# Patient Record
Sex: Female | Born: 1995
Health system: Southern US, Community
[De-identification: ages and names within clinical notes are randomized; demographics above are authoritative.]

## PROBLEM LIST (undated history)

## (undated) DIAGNOSIS — N393 Stress incontinence (female) (male): Secondary | ICD-10-CM

## (undated) DIAGNOSIS — F419 Anxiety disorder, unspecified: Secondary | ICD-10-CM

## (undated) DIAGNOSIS — F329 Major depressive disorder, single episode, unspecified: Secondary | ICD-10-CM

## (undated) DIAGNOSIS — F32A Depression, unspecified: Secondary | ICD-10-CM

## (undated) DIAGNOSIS — N3281 Overactive bladder: Secondary | ICD-10-CM

## (undated) DIAGNOSIS — J45909 Unspecified asthma, uncomplicated: Secondary | ICD-10-CM

## (undated) HISTORY — PX: WISDOM TOOTH EXTRACTION: SHX21

## (undated) HISTORY — DX: Overactive bladder: N32.81

## (undated) HISTORY — DX: Stress incontinence (female) (male): N39.3

---
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
## 2018-03-24 ENCOUNTER — Other Ambulatory Visit: Payer: Self-pay | Admitting: Nurse Practitioner

## 2018-03-24 ENCOUNTER — Ambulatory Visit
Admission: RE | Admit: 2018-03-24 | Discharge: 2018-03-24 | Disposition: A | Discharge status: Home / Self Care | Payer: Commercial Managed Care - PPO | Source: Ambulatory Visit | Attending: Nurse Practitioner | Admitting: Nurse Practitioner

## 2018-03-24 DIAGNOSIS — R590 Localized enlarged lymph nodes: Secondary | ICD-10-CM

## 2018-03-29 ENCOUNTER — Other Ambulatory Visit: Payer: Managed Care, Other (non HMO)

## 2018-03-29 ENCOUNTER — Other Ambulatory Visit: Payer: Self-pay | Admitting: Nurse Practitioner

## 2018-03-29 ENCOUNTER — Ambulatory Visit
Admission: RE | Admit: 2018-03-29 | Discharge: 2018-03-29 | Disposition: A | Discharge status: Home / Self Care | Payer: Managed Care, Other (non HMO) | Source: Ambulatory Visit | Attending: Nurse Practitioner | Admitting: Nurse Practitioner

## 2018-03-29 DIAGNOSIS — M79622 Pain in left upper arm: Secondary | ICD-10-CM

## 2018-03-29 DIAGNOSIS — R590 Localized enlarged lymph nodes: Secondary | ICD-10-CM

## 2018-04-01 ENCOUNTER — Other Ambulatory Visit: Payer: Self-pay | Admitting: Nurse Practitioner

## 2018-04-01 ENCOUNTER — Ambulatory Visit
Admission: RE | Admit: 2018-04-01 | Discharge: 2018-04-01 | Disposition: A | Discharge status: Home / Self Care | Payer: Commercial Managed Care - PPO | Source: Ambulatory Visit | Attending: Nurse Practitioner | Admitting: Nurse Practitioner

## 2018-04-01 DIAGNOSIS — M79622 Pain in left upper arm: Secondary | ICD-10-CM

## 2018-08-06 DIAGNOSIS — T7840XA Allergy, unspecified, initial encounter: Secondary | ICD-10-CM | POA: Insufficient documentation

## 2018-11-12 DIAGNOSIS — Z30431 Encounter for routine checking of intrauterine contraceptive device: Secondary | ICD-10-CM | POA: Diagnosis not present

## 2018-12-03 ENCOUNTER — Ambulatory Visit (HOSPITAL_COMMUNITY)
Admission: EM | Admit: 2018-12-03 | Discharge: 2018-12-03 | Disposition: A | Discharge status: Home / Self Care | Payer: Commercial Managed Care - PPO | Attending: Internal Medicine | Admitting: Internal Medicine

## 2018-12-03 ENCOUNTER — Encounter (HOSPITAL_COMMUNITY): Payer: Self-pay | Admitting: Emergency Medicine

## 2018-12-03 DIAGNOSIS — M25511 Pain in right shoulder: Secondary | ICD-10-CM | POA: Diagnosis not present

## 2018-12-03 HISTORY — DX: Depression, unspecified: F32.A

## 2018-12-03 HISTORY — DX: Anxiety disorder, unspecified: F41.9

## 2018-12-03 HISTORY — DX: Major depressive disorder, single episode, unspecified: F32.9

## 2018-12-03 MED ORDER — NAPROXEN 500 MG PO TABS: 500.0000 mg | ORAL_TABLET | Freq: Two times a day (BID) | ORAL | 0 refills | Status: DC

## 2018-12-03 NOTE — Discharge Instructions (Signed)
Shoulder exam today suggests a rotator cuff issue, possibly overuse.  Avoid activities that markedly increase pain.  Prescription for naproxen (anti inflammatory/pain reliever) was given; ice for 5-10 minutes several times daily may also help decrease pain.  Physical therapy may help decrease pain/stiffness also.  Anticipate gradual improvement over the next few weeks.  Recheck or followup with your primary care provider or sports med or orthopedic care provider to discuss next steps if not improving as expected.

## 2018-12-03 NOTE — ED Triage Notes (Signed)
Pt c/o L shoulder pain x2 weeks ago, denies injury, denies heavy lifting, states it hurts when she raises her arm above her head.

## 2018-12-03 NOTE — ED Provider Notes (Signed)
[facility]    CSN: [id number] Arrival date & time: 12/03/18  1030     History   Chief Complaint Chief Complaint  Patient presents with  . Shoulder Pain    HPI Brianna Barr is a 23 y.o. female.   She presents today with 2-week history of left shoulder pain.  Able to move her shoulder, just painful.  Recently got a new mattress, but sleeps in the same position, typically with both arms overhead.  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  Not coughing, not short of breath.  No unusual leg pain or swelling.  Has had some issues with loose stools in the last several months, after taking prolonged courses of antibiotics twice last year for episodes of lymphadenitis, axillary and cervical.  No rash.  No fever.    HPI  Past Medical History:  Diagnosis Date  . Anxiety   . Asthma   . Depression      Past Surgical History:  Procedure Laterality Date  . WISDOM TOOTH EXTRACTION         Home Medications    Prior to Admission medications   Medication Sig Start Date End Date Taking? Authorizing Provider  Vilazodone HCl (VIIBRYD) 20 MG TABS TAKE 1 TABLET BY MOUTH EVERY DAY 07/22/18  Yes [provider]  ipratropium (ATROVENT) 0.06 % nasal spray Place 2 sprays into both nostrils 4 (four) times daily. 11/22/13   Presson, Mathis Fare, PA  naproxen (NAPROSYN) 500 MG tablet Take 1 tablet (500 mg total) by mouth 2 (two) times daily. 12/03/18   Isa Rankin, MD    Family History No family history on file.  Social History Social History   Tobacco Use  . Smoking status: Never Smoker  Substance Use Topics  . Alcohol use: No  . Drug use: No     Allergies   Benadryl [diphenhydramine hcl (sleep)]   Review of Systems Review of Systems  All other systems reviewed and are negative.    Physical Exam Triage Vital Signs ED Triage Vitals  Enc Vitals Group     BP  12/03/18 1113 124/74     Pulse Rate 12/03/18 1112 89     Resp 12/03/18 1112 16     Temp 12/03/18 1112 (!) 97.4 F (36.3 C)     Temp src --      SpO2 12/03/18 1112 99 %     Weight --      Height --      Pain Score 12/03/18 1113 3     Pain Loc --    Updated Vital Signs BP 124/74   Pulse 89   Temp (!) 97.4 F (36.3 C)   Resp 16   SpO2 99%  Physical Exam Vitals signs and nursing note reviewed.  Constitutional:      General: She is not in acute distress.    Comments: Alert, nicely groomed  HENT:     Head: Atraumatic.  Eyes:     Comments: Conjugate gaze, no eye redness/drainage  Neck:     Musculoskeletal: Neck supple.  Cardiovascular:     Rate and Rhythm: Normal rate.  Pulmonary:     Effort: No respiratory distress.  Abdominal:     General: There is no distension.  Musculoskeletal: Normal range of motion.     Comments: No leg swelling Able to lift both arms overhead quickly, hurts at the  left shoulder.  Able to externally and internally rotate, particularly painful to internally rotate the left shoulder. Tender to palpation along the anterior joint line of the left shoulder.  No tenderness over the [facility].  Skin:    General: Skin is warm and dry.     Comments: No cyanosis  Neurological:     Mental Status: She is alert and oriented to person, place, and time.      Final Clinical Impressions(s) / UC Diagnoses   Final diagnoses:  Acute pain of right shoulder     Discharge Instructions     Shoulder exam today suggests a rotator cuff issue, possibly overuse.  Avoid activities that markedly increase pain.  Prescription for naproxen (anti inflammatory/pain reliever) was given; ice for 5-10 minutes several times daily may also help decrease pain.  Physical therapy may help decrease pain/stiffness also.  Anticipate gradual improvement over the next few weeks.  Recheck or followup with your primary care provider or sports med or orthopedic care provider to discuss next  steps if not improving as expected.     ED Prescriptions    Medication Sig Dispense Auth. Provider   naproxen (NAPROSYN) 500 MG tablet Take 1 tablet (500 mg total) by mouth 2 (two) times daily. 30 tablet Isa RankinMurray, Loy Mccartt Wilson, MD        Isa RankinMurray, Luara Faye Wilson, MD 12/04/18 [id number]

## 2019-05-11 ENCOUNTER — Encounter: Payer: Self-pay | Admitting: Podiatry

## 2019-05-11 ENCOUNTER — Other Ambulatory Visit: Payer: Self-pay | Admitting: Podiatry

## 2019-05-11 ENCOUNTER — Other Ambulatory Visit: Payer: Self-pay

## 2019-05-11 ENCOUNTER — Ambulatory Visit (INDEPENDENT_AMBULATORY_CARE_PROVIDER_SITE_OTHER): Payer: Commercial Managed Care - PPO

## 2019-05-11 ENCOUNTER — Ambulatory Visit: Payer: Commercial Managed Care - PPO | Admitting: Podiatry

## 2019-05-11 VITALS — Temp 98.2°F

## 2019-05-11 DIAGNOSIS — M79671 Pain in right foot: Secondary | ICD-10-CM | POA: Diagnosis not present

## 2019-05-11 DIAGNOSIS — M722 Plantar fascial fibromatosis: Secondary | ICD-10-CM

## 2019-05-11 DIAGNOSIS — M79672 Pain in left foot: Secondary | ICD-10-CM

## 2019-05-11 MED ORDER — DICLOFENAC SODIUM 75 MG PO TBEC: 75.0000 mg | DELAYED_RELEASE_TABLET | Freq: Two times a day (BID) | ORAL | 2 refills | Status: DC

## 2019-05-11 NOTE — Patient Instructions (Signed)

## 2019-05-12 NOTE — Progress Notes (Signed)
Subjective:   Patient ID: Brianna Barr, female   DOB: 23 y.o.   MRN: [id number]   HPI Patient presents stating she is developed a lot of pain in the bottom of both heels and the forefoot is starting to hurt due to the inability to wear spend much time bearing weight against the heels.  Patient states they have gradually gotten worse and it is been relatively recent since then started within the last month.  Patient does not smoke and would like to be more active   Review of Systems  All other systems reviewed and are negative.       Objective:  Physical Exam Vitals signs and nursing note reviewed.  Constitutional:      Appearance: She is well-developed.  Pulmonary:     Effort: Pulmonary effort is normal.  Musculoskeletal: Normal range of motion.  Skin:    General: Skin is warm.  Neurological:     Mental Status: She is alert.     Neurovascular status intact muscle strength found to be adequate range of motion within normal limits with patient found to have exquisite discomfort in the plantar heel region bilateral with inflammation fluid of the medial band and also obesity noted which is complicating factor to condition.  Patient does have depressed arch and has good digital perfusion well oriented x3       Assessment:  Acute plantar fasciitis bilateral with inflammation fluid of the medial band     Plan:  H&P conditions reviewed and today I injected the fascia 3 mg Kenalog 5 mg Xylocaine applied fascial brace bilateral gave instructions for physical therapy anti-inflammatories and supportive shoes and reappoint to recheck.  Also placed on diclofenac 75 mg twice daily  X-rays did not indicate spur with no indications of stress fracture arthritis

## 2019-05-25 ENCOUNTER — Ambulatory Visit: Payer: Commercial Managed Care - PPO | Admitting: Podiatry

## 2019-05-26 ENCOUNTER — Encounter: Payer: Self-pay | Admitting: Podiatry

## 2019-05-26 ENCOUNTER — Other Ambulatory Visit: Payer: Self-pay

## 2019-05-26 ENCOUNTER — Ambulatory Visit (INDEPENDENT_AMBULATORY_CARE_PROVIDER_SITE_OTHER): Payer: Commercial Managed Care - PPO | Admitting: Podiatry

## 2019-05-26 VITALS — Temp 98.2°F

## 2019-05-26 DIAGNOSIS — M722 Plantar fascial fibromatosis: Secondary | ICD-10-CM | POA: Diagnosis not present

## 2019-05-26 NOTE — Progress Notes (Signed)
Subjective:   Patient ID: Brianna Barr, female   DOB: 23 y.o.   MRN: [id number]   HPI Patient states my feet feel a lot better with mild discomfort but overall improved   ROS      Objective:  Physical Exam  Neurovascular status intact with significant diminishment of discomfort in the plantar fascial bilateral with mild discomfort still noted and pain also in the dorsum of the foot bilateral F2     Assessment:  Fasciitis symptoms improved with dorsal pain improved currently     Plan:  H&P conditions reviewed and today I went ahead and I advised on physical therapy stretching exercises shoe elevation and if symptoms were to reoccur will be seen back again and may require further treatment or orthotic treatment

## 2019-07-01 ENCOUNTER — Ambulatory Visit: Payer: Commercial Managed Care - PPO | Admitting: Family Medicine

## 2019-07-07 ENCOUNTER — Encounter: Payer: Self-pay | Admitting: Family Medicine

## 2019-07-07 ENCOUNTER — Other Ambulatory Visit: Payer: Self-pay

## 2019-07-07 ENCOUNTER — Ambulatory Visit: Payer: Commercial Managed Care - PPO | Admitting: Family Medicine

## 2019-07-07 VITALS — BP 110/70 | HR 102 | Temp 96.2°F | Ht 62.0 in | Wt 234.0 lb

## 2019-07-07 DIAGNOSIS — Z7689 Persons encountering health services in other specified circumstances: Secondary | ICD-10-CM

## 2019-07-07 DIAGNOSIS — N3281 Overactive bladder: Secondary | ICD-10-CM

## 2019-07-07 DIAGNOSIS — F419 Anxiety disorder, unspecified: Secondary | ICD-10-CM

## 2019-07-07 DIAGNOSIS — M722 Plantar fascial fibromatosis: Secondary | ICD-10-CM | POA: Diagnosis not present

## 2019-07-07 DIAGNOSIS — F32A Depression, unspecified: Secondary | ICD-10-CM | POA: Insufficient documentation

## 2019-07-07 DIAGNOSIS — J302 Other seasonal allergic rhinitis: Secondary | ICD-10-CM | POA: Diagnosis not present

## 2019-07-07 DIAGNOSIS — Z23 Encounter for immunization: Secondary | ICD-10-CM | POA: Diagnosis not present

## 2019-07-07 DIAGNOSIS — J4520 Mild intermittent asthma, uncomplicated: Secondary | ICD-10-CM | POA: Diagnosis not present

## 2019-07-07 DIAGNOSIS — F329 Major depressive disorder, single episode, unspecified: Secondary | ICD-10-CM

## 2019-07-07 DIAGNOSIS — R61 Generalized hyperhidrosis: Secondary | ICD-10-CM

## 2019-07-07 MED ORDER — VIIBRYD 20 MG PO TABS: ORAL_TABLET | ORAL | 3 refills | Status: DC

## 2019-07-07 NOTE — Patient Instructions (Signed)
Hyperhidrosis Hyperhidrosis is a condition in which the body sweats a lot more than normal (excessively). Sweating is a necessary function for a human body. It is normal to sweat when you are hot, physically active, or anxious. However, hyperhidrosis is sweating to an excessive degree. Although the condition is not a serious one, it can make you feel embarrassed. There are two kinds of hyperhidrosis:  Primary hyperhidrosis. The sweating usually localizes in one part of your body, such as your underarms, or in a few areas, such as your feet, face, underarms, and hands. This is the more common kind of hyperhidrosis.  Secondary hyperhidrosis. This type usually affects your entire body. What are the causes? The cause of this condition depends on the kind of hyperhidrosis that you have.  Primary hyperhidrosis may be caused by sweat glands that are more active than normal.  Secondary hyperhidrosis may be caused by an underlying condition or by taking certain medicines, such as antidepressants or diabetes medicines. Possible conditions that may cause secondary hyperhidrosis include: ? Diabetes. ? Gout. ? Anxiety. ? Obesity. ? Menopause. ? Overactive thyroid (hyperthyroidism). ? Tumors. ? Frostbite. ? Certain types of cancers. ? Alcoholism. ? Injury to your nervous system. ? Stroke. ? Parkinson's disease. What increases the risk? You are more likely to develop primary hyperhidrosis if you have a family history of the condition. What are the signs or symptoms? Symptoms of this condition include:  Feeling like you are sweating constantly, even while you are not being active.  Having skin that peels or gets paler or softer in the areas where you sweat the most.  Being able to see sweat on your skin. Other symptoms depend on the kind of hyperhidrosis that you have.  Symptoms of primary hyperhidrosis may include: ? Sweating in the same location on both sides of your body. ? Sweating only  during the day and not while you are sleeping. ? Sweating in specific areas, such as your underarms, palms, feet, and face.  Symptoms of secondary hyperhidrosis may include: ? Sweating all over your body. ? Sweating even while you sleep. How is this diagnosed? This condition may be diagnosed by:  Medical history.  Physical exam. You may also have other tests, including:  Tests to measure the amount of sweat you produce and to show the areas where you sweat the most. These tests may involve: ? Using color-changing chemicals to show patterns of sweating on the skin. ? Weighing paper that has been applied to the skin. This will show the amount of sweat that your body produces. ? Measuring the amount of water that evaporates from the skin. ? Using infrared technology to show patterns of sweating on the skin.  Tests to check for other conditions that may be causing excess sweating. This may include blood, urine, or imaging tests. How is this treated? Treatment for this condition depends on the kind of hyperhidrosis that you have and the areas of your body that are affected. Your health care provider will also treat any underlying conditions. Treatment may include:  Medicines, such as: ? Antiperspirants. These are medicines that stop sweat. ? Injectable medicines. These may include small injections of botulinum toxin. ? Oral medicines. These are taken by mouth to treat underlying conditions and other symptoms.  A procedure to: ? Temporarily turn off the sweat glands in your hands and feet (iontophoresis). ? Remove your sweat glands. ? Cut or destroy the nerves so that they do not send a signal to the sweat   glands (sympathectomy). Follow these instructions at home: Lifestyle   Limit or avoid foods or beverages that may increase your risk of sweating, such as: ? Spicy food. ? Caffeine. ? Alcohol. ? Foods that contain monosodium glutamate (MSG).  If your feet sweat: ? Wear  sandals when possible. ? Do not wear cotton socks. Wear socks that remove or wick moisture from your feet. ? Wear leather shoes. ? Avoid wearing the same pair of shoes for two days in a row.  Try placing sweat pads under your clothes to prevent underarm sweat from showing.  Keep a journal of your sweat symptoms and when they occur. This may help you identify things that trigger your sweating. General instructions  Take over-the-counter and prescription medicines only as told by your health care provider.  Use antiperspirants as told by your health care provider.  Consider joining a hyperhidrosis support group.  Keep all follow-up visits as told by your health care provider. This is important. Contact a health care provider if:  You have new symptoms.  Your symptoms get worse. Summary  Hyperhidrosis is a condition in which the body sweats a lot more than normal (excessively).  With primary hyperhidrosis, the sweating usually localizes in one part of your body, such as your underarms, or in a few areas, such as your feet, face, underarms, and hands. It is caused by overactive sweat glands in the affected area.  With secondary hyperhidrosis, the sweating affects your entire body. This is caused by an underlying condition.  Treatment for this condition depends on the kind of hyperhidrosis that you have and the parts of your body that are affected. This information is not intended to replace advice given to you by your health care provider. Make sure you discuss any questions you have with your health care provider. Document Released: 12/12/2005 Document Revised: 10/16/2017 Document Reviewed: 10/16/2017 Elsevier Patient Education  2020 [employer]  Living With Anxiety  After being diagnosed with an anxiety disorder, you may be relieved to know why you have felt or behaved a certain way. It is natural to also feel overwhelmed about the treatment ahead and what it will mean for your  life. With care and support, you can manage this condition and recover from it. How to cope with anxiety Dealing with stress Stress is your body's reaction to life changes and events, both good and bad. Stress can last just a few hours or it can be ongoing. Stress can play a major role in anxiety, so it is important to learn both how to cope with stress and how to think about it differently. Talk with your health care provider or a counselor to learn more about stress reduction. He or she may suggest some stress reduction techniques, such as:  Music therapy. This can include creating or listening to music that you enjoy and that inspires you.  Mindfulness-based meditation. This involves being aware of your normal breaths, rather than trying to control your breathing. It can be done while sitting or walking.  Centering prayer. This is a kind of meditation that involves focusing on a word, phrase, or sacred image that is meaningful to you and that brings you peace.  Deep breathing. To do this, expand your stomach and inhale slowly through your nose. Hold your breath for 3-5 seconds. Then exhale slowly, allowing your stomach muscles to relax.  Self-talk. This is a skill where you identify thought patterns that lead to anxiety reactions and correct those thoughts.  Muscle  relaxation. This involves tensing muscles then relaxing them. Choose a stress reduction technique that fits your lifestyle and personality. Stress reduction techniques take time and practice. Set aside 5-15 minutes a day to do them. Therapists can offer training in these techniques. The training may be covered by some insurance plans. Other things you can do to manage stress include:  Keeping a stress diary. This can help you learn what triggers your stress and ways to control your response.  Thinking about how you respond to certain situations. You may not be able to control everything, but you can control your reaction.  Making  time for activities that help you relax, and not feeling guilty about spending your time in this way. Therapy combined with coping and stress-reduction skills provides the best chance for successful treatment. Medicines Medicines can help ease symptoms. Medicines for anxiety include:  Anti-anxiety drugs.  Antidepressants.  Beta-blockers. Medicines may be used as the main treatment for anxiety disorder, along with therapy, or if other treatments are not working. Medicines should be prescribed by a health care provider. Relationships Relationships can play a big part in helping you recover. Try to spend more time connecting with trusted friends and family members. Consider going to couples counseling, taking family education classes, or going to family therapy. Therapy can help you and others better understand the condition. How to recognize changes in your condition Everyone has a different response to treatment for anxiety. Recovery from anxiety happens when symptoms decrease and stop interfering with your daily activities at home or work. This may mean that you will start to:  Have better concentration and focus.  Sleep better.  Be less irritable.  Have more energy.  Have improved memory. It is important to recognize when your condition is getting worse. Contact your health care provider if your symptoms interfere with home or work and you do not feel like your condition is improving. Where to find help and support: You can get help and support from these sources:  Self-help groups.  Online and [employer].  A trusted spiritual leader.  Couples counseling.  Family education classes.  Family therapy. Follow these instructions at home:  Eat a healthy diet that includes plenty of vegetables, fruits, whole grains, low-fat dairy products, and lean protein. Do not eat a lot of foods that are high in solid fats, added sugars, or salt.  Exercise. Most adults should do  the following: ? Exercise for at least 150 minutes each week. The exercise should increase your heart rate and make you sweat (moderate-intensity exercise). ? Strengthening exercises at least twice a week.  Cut down on caffeine, tobacco, alcohol, and other potentially harmful substances.  Get the right amount and quality of sleep. Most adults need 7-9 hours of sleep each night.  Make choices that simplify your life.  Take over-the-counter and prescription medicines only as told by your health care provider.  Avoid caffeine, alcohol, and certain over-the-counter cold medicines. These may make you feel worse. Ask your pharmacist which medicines to avoid.  Keep all follow-up visits as told by your health care provider. This is important. Questions to ask your health care provider  Would I benefit from therapy?  How often should I follow up with a health care provider?  How long do I need to take medicine?  Are there any long-term side effects of my medicine?  Are there any alternatives to taking medicine? Contact a health care provider if:  You have a hard time staying  focused or finishing daily tasks.  You spend many hours a day feeling worried about everyday life.  You become exhausted by worry.  You start to have headaches, feel tense, or have nausea.  You urinate more than normal.  You have diarrhea. Get help right away if:  You have a racing heart and shortness of breath.  You have thoughts of hurting yourself or others. If you ever feel like you may hurt yourself or others, or have thoughts about taking your own life, get help right away. You can go to your nearest emergency department or call:  Your local emergency services (911 in the [place]  A suicide crisis helpline, such as the National Suicide Prevention Lifeline at (702) 625-89271-463-706-9420. This is open 24-hours a day. Summary  Taking steps to deal with stress can help calm you.  Medicines cannot cure anxiety  disorders, but they can help ease symptoms.  Family, friends, and partners can play a big part in helping you recover from an anxiety disorder. This information is not intended to replace advice given to you by your health care provider. Make sure you discuss any questions you have with your health care provider. Document Released: 10/07/2016 Document Revised: 09/25/2017 Document Reviewed: 10/07/2016 Elsevier Patient Education  2020 [employer]  Living With Depression Everyone experiences occasional disappointment, sadness, and loss in their lives. When you are feeling down, blue, or sad for at least 2 weeks in a row, it may mean that you have depression. Depression can affect your thoughts and feelings, relationships, daily activities, and physical health. It is caused by changes in the way your brain functions. If you receive a diagnosis of depression, your health care provider will tell you which type of depression you have and what treatment options are available to you. If you are living with depression, there are ways to help you recover from it and also ways to prevent it from coming back. How to cope with lifestyle changes Coping with stress     Stress is your body's reaction to life changes and events, both good and bad. Stressful situations may include:  Getting married.  The death of a spouse.  Losing a job.  Retiring.  Having a baby. Stress can last just a few hours or it can be ongoing. Stress can play a major role in depression, so it is important to learn both how to cope with stress and how to think about it differently. Talk with your health care provider or a counselor if you would like to learn more about stress reduction. He or she may suggest some stress reduction techniques, such as:  Music therapy. This can include creating music or listening to music. Choose music that you enjoy and that inspires you.  Mindfulness-based meditation. This kind of meditation can  be done while sitting or walking. It involves being aware of your normal breaths, rather than trying to control your breathing.  Centering prayer. This is a kind of meditation that involves focusing on a spiritual word or phrase. Choose a word, phrase, or sacred image that is meaningful to you and that brings you peace.  Deep breathing. To do this, expand your stomach and inhale slowly through your nose. Hold your breath for 3-5 seconds, then exhale slowly, allowing your stomach muscles to relax.  Muscle relaxation. This involves intentionally tensing muscles then relaxing them. Choose a stress reduction technique that fits your lifestyle and personality. Stress reduction techniques take time and practice to develop. Set aside 5-15  minutes a day to do them. Therapists can offer training in these techniques. The training may be covered by some insurance plans. Other things you can do to manage stress include:  Keeping a stress diary. This can help you learn what triggers your stress and ways to control your response.  Understanding what your limits are and saying no to requests or events that lead to a schedule that is too full.  Thinking about how you respond to certain situations. You may not be able to control everything, but you can control how you react.  Adding humor to your life by watching funny films or TV shows.  Making time for activities that help you relax and not feeling guilty about spending your time this way.  Medicines Your health care provider may suggest certain medicines if he or she feels that they will help improve your condition. Avoid using alcohol and other substances that may prevent your medicines from working properly (may interact). It is also important to:  Talk with your pharmacist or health care provider about all the medicines that you take, their possible side effects, and what medicines are safe to take together.  Make it your goal to take part in all  treatment decisions (shared decision-making). This includes giving input on the side effects of medicines. It is best if shared decision-making with your health care provider is part of your total treatment plan. If your health care provider prescribes a medicine, you may not notice the full benefits of it for 4-8 weeks. Most people who are treated for depression need to be on medicine for at least 6-12 months after they feel better. If you are taking medicines as part of your treatment, do not stop taking medicines without first talking to your health care provider. You may need to have the medicine slowly decreased (tapered) over time to decrease the risk of harmful side effects. Relationships Your health care provider may suggest family therapy along with individual therapy and drug therapy. While there may not be family problems that are causing you to feel depressed, it is still important to make sure your family learns as much as they can about your mental health. Having your family's support can help make your treatment successful. How to recognize changes in your condition Everyone has a different response to treatment for depression. Recovery from major depression happens when you have not had signs of major depression for two months. This may mean that you will start to:  Have more interest in doing activities.  Feel less hopeless than you did 2 months ago.  Have more energy.  Overeat less often, or have better or improving appetite.  Have better concentration. Your health care provider will work with you to decide the next steps in your recovery. It is also important to recognize when your condition is getting worse. Watch for these signs:  Having fatigue or low energy.  Eating too much or too little.  Sleeping too much or too little.  Feeling restless, agitated, or hopeless.  Having trouble concentrating or making decisions.  Having unexplained physical complaints.  Feeling  irritable, angry, or aggressive. Get help as soon as you or your family members notice these symptoms coming back. How to get support and help from others How to talk with friends and family members about your condition  Talking to friends and family members about your condition can provide you with one way to get support and guidance. Reach out to trusted friends or  family members, explain your symptoms to them, and let them know that you are working with a health care provider to treat your depression. Financial resources Not all insurance plans cover mental health care, so it is important to check with your insurance carrier. If paying for co-pays or counseling services is a problem, search for a local or county mental health care center. They may be able to offer public mental health care services at low or no cost when you are not able to see a private health care provider. If you are taking medicine for depression, you may be able to get the generic form, which may be less expensive. Some makers of prescription medicines also offer help to patients who cannot afford the medicines they need. Follow these instructions at home:   Get the right amount and quality of sleep.  Cut down on using caffeine, tobacco, alcohol, and other potentially harmful substances.  Try to exercise, such as walking or lifting small weights.  Take over-the-counter and prescription medicines only as told by your health care provider.  Eat a healthy diet that includes plenty of vegetables, fruits, whole grains, low-fat dairy products, and lean protein. Do not eat a lot of foods that are high in solid fats, added sugars, or salt.  Keep all follow-up visits as told by your health care provider. This is important. Contact a health care provider if:  You stop taking your antidepressant medicines, and you have any of these symptoms: ? Nausea. ? Headache. ? Feeling lightheaded. ? Chills and body aches. ? Not being  able to sleep (insomnia).  You or your friends and family think your depression is getting worse. Get help right away if:  You have thoughts of hurting yourself or others. If you ever feel like you may hurt yourself or others, or have thoughts about taking your own life, get help right away. You can go to your nearest emergency department or call:  Your local emergency services (911 in the [place]  A suicide crisis helpline, such as the National Suicide Prevention Lifeline at 250-373-7805. This is open 24-hours a day. Summary  If you are living with depression, there are ways to help you recover from it and also ways to prevent it from coming back.  Work with your health care team to create a management plan that includes counseling, stress management techniques, and healthy lifestyle habits. This information is not intended to replace advice given to you by your health care provider. Make sure you discuss any questions you have with your health care provider. Document Released: 09/15/2016 Document Revised: 02/04/2019 Document Reviewed: 09/15/2016 Elsevier Patient Education  2020 [employer]  Overactive Bladder, Adult  Overactive bladder refers to a condition in which a person has a sudden need to pass urine. The person may leak urine if he or she cannot get to the bathroom fast enough (urinary incontinence). A person with this condition may also wake up several times in the night to go to the bathroom. Overactive bladder is associated with poor nerve signals between your bladder and your brain. Your bladder may get the signal to empty before it is full. You may also have very sensitive muscles that make your bladder squeeze too soon. These symptoms might interfere with daily work or social activities. What are the causes? This condition may be associated with or caused by:  Urinary tract infection.  Infection of nearby tissues, such as the prostate.  Prostate enlargement.   Surgery on the uterus  or urethra.  Bladder stones, inflammation, or tumors.  Drinking too much caffeine or alcohol.  Certain medicines, especially medicines that get rid of extra fluid in the body (diuretics).  Muscle or nerve weakness, especially from: ? A spinal cord injury. ? Stroke. ? Multiple sclerosis. ? Parkinson's disease.  Diabetes.  Constipation. What increases the risk? You may be at greater risk for overactive bladder if you:  Are an older adult.  Smoke.  Are going through menopause.  Have prostate problems.  Have a neurological disease, such as stroke, dementia, Parkinson's disease, or multiple sclerosis (MS).  Eat or drink things that irritate the bladder. These include alcohol, spicy food, and caffeine.  Are overweight or obese. What are the signs or symptoms? Symptoms of this condition include:  Sudden, strong urge to urinate.  Leaking urine.  Urinating 8 or more times a day.  Waking up to urinate 2 or more times a night. How is this diagnosed? Your health care provider may suspect overactive bladder based on your symptoms. He or she will diagnose this condition by:  A physical exam and medical history.  Blood or urine tests. You might need bladder or urine tests to help determine what is causing your overactive bladder. You might also need to see a health care provider who specializes in urinary tract problems (urologist). How is this treated? Treatment for overactive bladder depends on the cause of your condition and whether it is mild or severe. You can also make lifestyle changes at home. Options include:  Bladder training. This may include: ? Learning to control the urge to urinate by following a schedule that directs you to urinate at regular intervals (timed voiding). ? Doing Kegel exercises to strengthen your pelvic floor muscles, which support your bladder. Toning these muscles can help you control urination, even if your bladder muscles  are overactive.  Special devices. This may include: ? Biofeedback, which uses sensors to help you become aware of your body's signals. ? Electrical stimulation, which uses electrodes placed inside the body (implanted) or outside the body. These electrodes send gentle pulses of electricity to strengthen the nerves or muscles that control the bladder. ? Women may use a plastic device that fits into the vagina and supports the bladder (pessary).  Medicines. ? Antibiotics to treat bladder infection. ? Antispasmodics to stop the bladder from releasing urine at the wrong time. ? Tricyclic antidepressants to relax bladder muscles. ? Injections of botulinum toxin type A directly into the bladder tissue to relax bladder muscles.  Lifestyle changes. This may include: ? Weight loss. Talk to your health care provider about weight loss methods that would work best for you. ? Diet changes. This may include reducing how much alcohol and caffeine you consume, or drinking fluids at different times of the day. ? Not smoking. Do not use any products that contain nicotine or tobacco, such as cigarettes and e-cigarettes. If you need help quitting, ask your health care provider.  Surgery. ? A device may be implanted to help manage the nerve signals that control urination. ? An electrode may be implanted to stimulate electrical signals in the bladder. ? A procedure may be done to change the shape of the bladder. This is done only in very severe cases. Follow these instructions at home: Lifestyle  Make any diet or lifestyle changes that are recommended by your health care provider. These may include: ? Drinking less fluid or drinking fluids at different times of the day. ? Cutting down on  caffeine or alcohol. ? Doing Kegel exercises. ? Losing weight if needed. ? Eating a healthy and balanced diet to prevent constipation. This may include:  Eating foods that are high in fiber, such as fresh fruits and  vegetables, whole grains, and beans.  Limiting foods that are high in fat and processed sugars, such as fried and sweet foods. General instructions  Take over-the-counter and prescription medicines only as told by your health care provider.  If you were prescribed an antibiotic medicine, take it as told by your health care provider. Do not stop taking the antibiotic even if you start to feel better.  Use any implants or pessary as told by your health care provider.  If needed, wear pads to absorb urine leakage.  Keep a journal or log to track how much and when you drink and when you feel the need to urinate. This will help your health care provider monitor your condition.  Keep all follow-up visits as told by your health care provider. This is important. Contact a health care provider if:  You have a fever.  Your symptoms do not get better with treatment.  Your pain and discomfort get worse.  You have more frequent urges to urinate. Get help right away if:  You are not able to control your bladder. Summary  Overactive bladder refers to a condition in which a person has a sudden need to pass urine.  Several conditions may lead to an overactive bladder.  Treatment for overactive bladder depends on the cause and severity of your condition.  Follow your health care provider's instructions about lifestyle changes, doing Kegel exercises, keeping a journal, and taking medicines. This information is not intended to replace advice given to you by your health care provider. Make sure you discuss any questions you have with your health care provider. Document Released: 08/09/2009 Document Revised: 02/03/2019 Document Reviewed: 10/29/2017 Elsevier Patient Education  2020 [employer].

## 2019-07-07 NOTE — Progress Notes (Signed)
SUBJECTIVE: PMH: Pt is a 23 yo female with pmh sig for asthma, anxiety, depression, OAB, hyperhidrosis, plantar fasciitis, and seasonal allergies.  Patient previously seen at [facility].  Asthma: -Albuterol as needed -Symptoms with exercise, changes in weather, if he gets sick.  Anxiety and depression: -In the past was on Zoloft and Lexapro without success -Viibryd has worked -Patient stopped taking Viibryd at the start of COVID-19 pandemic.  - Interested in restarting medication. -Has been in counseling since grade school.  Needs to find a new counselor. -Patient endorses difficulty with virtual visits 2/2 increased anxiety with people on the phone or being seen virtually.  OAB: -Seen by urology in the past -Started on medication but did not like it -Not drinking caffeine or eating spicy foods -will decrease fluid intake -We will wear a panty liner if has to be out for prolonged periods of time. -Considering doing Keagle's  Hyperhidrosis: -Endorses intense sweating in armpits, hands, feet -Was on medication in the past but it stopped working. -Has tried numerous OTC treatment options including deodorants and creams.  Plantar fasciitis -Seen by podiatry -S/p steroid Injection -Also given diclofenac to take every other day -Stretching  Seasonal allergies: -May use OTC eyedrops as needed  Allergies: NKDA  Past surgical history: Wisdom teeth extraction  Social history: Patient is single.  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  Patient denies alcohol, tobacco, drug use.   Past Medical History:  Diagnosis Date  . Anxiety   . Asthma   . Depression     Past Surgical History:  Procedure Laterality Date  . WISDOM TOOTH EXTRACTION      Current Outpatient Medications on File Prior to Visit  Medication Sig Dispense Refill  . ALBUTEROL IN Inhale into the lungs.    . diclofenac (VOLTAREN) 75 MG EC tablet Take 1 tablet (75 mg  total) by mouth 2 (two) times daily. 50 tablet 2  . ipratropium (ATROVENT) 0.06 % nasal spray Place 2 sprays into both nostrils 4 (four) times daily. 15 mL 0  . Olopatadine HCl 0.2 % SOLN Apply to eye.    . Vilazodone HCl (VIIBRYD) 20 MG TABS TAKE 1 TABLET BY MOUTH EVERY DAY     No current facility-administered medications on file prior to visit.     No Active Allergies  History reviewed. No pertinent family history.  Social History   Socioeconomic History  . Marital status: Single    Spouse name: Not on file  . Number of children: Not on file  . Years of education: Not on file  . Highest education level: Not on file  Occupational History  . Not on file  Social Needs  . Financial resource strain: Not on file  . Food insecurity    Worry: Not on file    Inability: Not on file  . Transportation needs    Medical: Not on file    Non-medical: Not on file  Tobacco Use  . Smoking status: Never Smoker  . Smokeless tobacco: Never Used  Substance and Sexual Activity  . Alcohol use: No  . Drug use: No  . Sexual activity: Not on file  Lifestyle  . Physical activity    Days per week: Not on file    Minutes per session: Not on file  . Stress: Not on file  Relationships  . Social Herbalist on phone: Not on file    Gets together: Not on file  Attends religious service: Not on file    Active member of club or organization: Not on file    Attends meetings of clubs or organizations: Not on file    Relationship status: Not on file  . Intimate partner violence    Fear of current or ex partner: Not on file    Emotionally abused: Not on file    Physically abused: Not on file    Forced sexual activity: Not on file  Other Topics Concern  . Not on file  Social History Narrative  . Not on file    ROS General: Denies fever, chills, night sweats, changes in weight, changes in appetite  + hyperhidrosis HEENT: Denies headaches, ear pain, changes in vision, rhinorrhea, sore  throat CV: Denies CP, palpitations, SOB, orthopnea Pulm: Denies SOB, cough, wheezing +h/o asthma GI: Denies abdominal pain, nausea, vomiting, diarrhea, constipation GU: Denies dysuria, hematuria, frequency, vaginal discharge  + Urinary urgency Msk: Denies muscle cramps, joint pains  +foot pain Neuro: Denies weakness, numbness, tingling Skin: Denies rashes, bruising Psych: Denies hallucinations  +anxiety and depression   BP 110/70 (BP Location: Left Arm, Patient Position: Sitting, Cuff Size: Large)   Pulse (!) 102   Temp (!) 96.2 F (35.7 C) (Oral)   Ht 5\' 2"  (1.575 m)   Wt 234 lb (106.1 kg)   SpO2 98%   BMI 42.80 kg/m   Physical Exam Gen. Pleasant, well developed, well-nourished, in NAD HEENT - [place]/AT, PERRL, EOMI, no scleral icterus, no nasal drainage, pharynx without erythema or exudate. Lungs: no use of accessory muscles, no dullness to percussion, CTAB, no wheezes, rales or rhonchi Cardiovascular: RRR, No r/g/m, no peripheral edema Abdomen: BS present, soft, nontender,nondistended Musculoskeletal: No deformities, moves all four extremities, no cyanosis or clubbing, normal tone Neuro:  A&Ox3, CN II-XII intact, normal gait Skin:  Warm, dry, intact, no lesions  No results found for this or any previous visit (from the past 2160 hour(s)).  Assessment/Plan: Anxiety and depression  -PHQ 9 score 11 -Gad 7 score 14 -Discussed behavioral health.  Patient encouraged to make her own appointment. -Pt to d/c diclofenac as may cause serotonin syndrome when used with Viibryd - Plan: Vilazodone HCl (VIIBRYD) 20 MG TABS  Mild intermittent asthma without complication -Albuterol inhaler as needed  Seasonal allergies -Continue OTC eye gtts  Plantar fasciitis -Improving s/p steroid injection -Continue follow-up with podiatry -Continue braces, stretching, supportive shoes -pt to d/c diclofenac, as may cause serotonin syndrome when combined with Viibryd  Hyperhidrosis -Discussed  various treatment options including Botox -Given handout  OAB (overactive bladder) -Discussed Keagle exercises, bladder training -Given handout -Patient to find out medication previously on and notify clinic -Consider follow-up with Urology  Encounter to establish care -We reviewed the PMH, PSH, FH, SH, Meds and Allergies. -We provided refills for any medications we will prescribe as needed. -We addressed current concerns per orders and patient instructions. -We have asked for records for pertinent exams, studies, vaccines and notes from previous providers. -We have advised patient to follow up per instructions below.  Need for influenza vaccine -given this visit  F/u 4-6 wks  Abbe AmsterdamShannon Alexavier Tsutsui, MD

## 2019-07-14 ENCOUNTER — Other Ambulatory Visit: Payer: Self-pay | Admitting: Certified Nurse Midwife

## 2019-07-14 ENCOUNTER — Encounter: Payer: Self-pay | Admitting: Certified Nurse Midwife

## 2019-07-14 ENCOUNTER — Other Ambulatory Visit: Payer: Self-pay

## 2019-07-14 ENCOUNTER — Ambulatory Visit (INDEPENDENT_AMBULATORY_CARE_PROVIDER_SITE_OTHER): Payer: Commercial Managed Care - PPO | Admitting: Certified Nurse Midwife

## 2019-07-14 VITALS — BP 131/92 | HR 103 | Ht 62.0 in | Wt 234.7 lb

## 2019-07-14 DIAGNOSIS — Z01419 Encounter for gynecological examination (general) (routine) without abnormal findings: Secondary | ICD-10-CM

## 2019-07-14 DIAGNOSIS — Z113 Encounter for screening for infections with a predominantly sexual mode of transmission: Secondary | ICD-10-CM | POA: Diagnosis not present

## 2019-07-14 DIAGNOSIS — N898 Other specified noninflammatory disorders of vagina: Secondary | ICD-10-CM | POA: Diagnosis not present

## 2019-07-14 DIAGNOSIS — Z124 Encounter for screening for malignant neoplasm of cervix: Secondary | ICD-10-CM | POA: Diagnosis not present

## 2019-07-14 DIAGNOSIS — N942 Vaginismus: Secondary | ICD-10-CM

## 2019-07-14 DIAGNOSIS — N3941 Urge incontinence: Secondary | ICD-10-CM

## 2019-07-14 LAB — POCT URINALYSIS DIPSTICK
Bilirubin, UA: NEGATIVE
Glucose, UA: NEGATIVE
Ketones, UA: NEGATIVE
Leukocytes, UA: NEGATIVE
Nitrite, UA: NEGATIVE
Protein, UA: POSITIVE — AB
Spec Grav, UA: 1.015 (ref 1.010–1.025)
Urobilinogen, UA: 0.2 E.U./dL
pH, UA: 6 (ref 5.0–8.0)

## 2019-07-14 MED ORDER — FLUCONAZOLE 150 MG PO TABS: 150.0000 mg | ORAL_TABLET | Freq: Every day | ORAL | 0 refills | Status: DC

## 2019-07-14 MED ORDER — TINIDAZOLE 500 MG PO TABS: 2.0000 g | ORAL_TABLET | Freq: Every day | ORAL | 0 refills | Status: DC

## 2019-07-14 NOTE — Progress Notes (Signed)
Pt presents for new patient AEX. She states that her last pap there was at age 23. Last pap showed ASCUS, but negative HPV. Pt does complain of having some vaginal discharge with a fishy odor. She states that the discharge has subsided, but the odor is still there. She also complains of having some burning with urination. Also complains of having pain with IC when she was sexually active 2 years ago. She has not been active within the last 2 years.

## 2019-07-15 LAB — CERVICOVAGINAL ANCILLARY ONLY
Bacterial Vaginitis (gardnerella): NEGATIVE
Candida Glabrata: NEGATIVE
Candida Vaginitis: NEGATIVE
Molecular Disclaimer: NEGATIVE
Molecular Disclaimer: NEGATIVE
Molecular Disclaimer: NEGATIVE
Molecular Disclaimer: NORMAL
Trichomonas: NEGATIVE

## 2019-07-15 LAB — HEPATITIS C ANTIBODY: Hep C Virus Ab: <0.1 s/co ratio (ref 0.0–0.9)

## 2019-07-15 LAB — RPR: RPR Ser Ql: NONREACTIVE

## 2019-07-15 LAB — HEPATITIS B SURFACE ANTIGEN: Hepatitis B Surface Ag: NEGATIVE

## 2019-07-15 LAB — HIV ANTIBODY (ROUTINE TESTING W REFLEX): HIV Screen 4th Generation wRfx: NONREACTIVE

## 2019-07-16 LAB — URINE CULTURE

## 2019-07-16 LAB — CERVICOVAGINAL ANCILLARY ONLY
Chlamydia: NEGATIVE
Neisseria Gonorrhea: NEGATIVE

## 2019-07-16 NOTE — Progress Notes (Signed)
GYNECOLOGY ANNUAL PREVENTATIVE CARE ENCOUNTER NOTE  History:     Brianna Barr is a 23 y.o. G0P0000 female here for a gynecologic exam. She is a new patient to the practice and here to establish care.  Current complaints: vaginal discharge and odor, pain with intercourse, and dysuria.   Denies abnormal vaginal bleeding, pelvic pain, or other gynecologic concerns.  She request STD screening.    Gynecologic History No LMP recorded. (Menstrual status: IUD). Contraception: IUD- Mirena IUD placed in 2018 per patient  Last Pap: 2018. Results were: abnormal with negative HPV  Obstetric History OB History  Gravida Para Term Preterm AB Living  0 0 0 0 0 0  SAB TAB Ectopic Multiple Live Births  0 0 0 0 0    Past Medical History:  Diagnosis Date  . Anxiety   . Asthma   . Depression     Past Surgical History:  Procedure Laterality Date  . WISDOM TOOTH EXTRACTION      Current Outpatient Medications on File Prior to Visit  Medication Sig Dispense Refill  . ALBUTEROL IN Inhale into the lungs.    Marland Kitchen ipratropium (ATROVENT) 0.06 % nasal spray Place 2 sprays into both nostrils 4 (four) times daily. 15 mL 0  . Vilazodone HCl (VIIBRYD) 20 MG TABS Take half a tab (10 mg) daily x 7 days.  Then increase to 1 tab (20 mg) daily 30 tablet 3  . diclofenac (VOLTAREN) 75 MG EC tablet Take 1 tablet (75 mg total) by mouth 2 (two) times daily. (Patient not taking: Reported on 07/14/2019) 50 tablet 2  . Olopatadine HCl 0.2 % SOLN Apply to eye.    . Vilazodone HCl (VIIBRYD) 20 MG TABS TAKE 1 TABLET BY MOUTH EVERY DAY     No current facility-administered medications on file prior to visit.     No Active Allergies  Social History:  reports that she has never smoked. She has never used smokeless tobacco. She reports that she does not drink alcohol or use drugs.  Family History  Problem Relation Age of Onset  . Multiple sclerosis Mother   . Hypertension Father   . Hypertension Maternal  Grandmother   . Diabetes Maternal Grandmother   . Heart disease Maternal Grandmother   . Cervical cancer Maternal Grandmother   . Stroke Paternal Grandmother   . Heart disease Paternal Grandmother   . Hypertension Paternal Grandmother   . Diabetes Paternal Grandmother   . Endometrial cancer Paternal Grandmother   . Stroke Paternal Grandfather     The following portions of the patient's history were reviewed and updated as appropriate: allergies, current medications, past family history, past medical history, past social history, past surgical history and problem list.  Review of Systems Pertinent items noted in HPI and remainder of comprehensive ROS otherwise negative.  Physical Exam:  BP (!) 131/92   Pulse (!) 103   Ht 5\' 2"  (1.575 m)   Wt 234 lb 11.2 oz (106.5 kg)   BMI 42.93 kg/m  CONSTITUTIONAL: Well-developed, obese female in no acute distress.  HENT:  Normocephalic, atraumatic, External right and left ear normal. Oropharynx is clear and moist EYES: Conjunctivae and EOM are normal. Pupils are equal, round, and reactive to light.  NECK: Normal range of motion, supple, no masses.  Normal thyroid.  SKIN: Skin is warm and dry. No rash noted. Not diaphoretic. No erythema. No pallor. MUSCULOSKELETAL: Normal range of motion. No tenderness.  No cyanosis, clubbing, or edema.  2+  distal pulses. NEUROLOGIC: Alert and oriented to person, place, and time. Normal reflexes, muscle tone coordination. No cranial nerve deficit noted. PSYCHIATRIC: Normal mood and affect. Normal behavior. Normal judgment and thought content. CARDIOVASCULAR: Normal heart rate noted, regular rhythm RESPIRATORY: Clear to auscultation bilaterally. Effort and breath sounds normal, no problems with respiration noted. BREASTS: Symmetric in size. No masses, skin changes, nipple drainage, or lymphadenopathy. ABDOMEN: Soft, normal bowel sounds, no distention noted.  No tenderness, rebound or guarding.  PELVIC: curdy white  discharge present on external genitalia between labia and clitoris; normal appearing vaginal mucosa and cervix. Small amount of white thin discharge with odor present.  Pap smear obtained.  Normal uterine size, no other palpable masses. Pelvic floor discomfort and pelvic pain with speculum examination.    Assessment and Plan:    1. Women's annual routine gynecological examination - Pap smear obtained today, hx of abnormal pap in 2018 (ASCUS with neg HPV) - if 2 normal pap smears after abnormal can move to 3 year recommendation  - Cytology - PAP( [facility])  2. Urinary incontinence, urge - Patient reports dysuria and urge incontinence over the past month, has appointment scheduled with urology for the incontinence  - POCT Urinalysis Dipstick - Urine Culture  3. Screening for STDs (sexually transmitted diseases) - Patient request screening for STDs even though she denies intercourse in the past 2 years  - Cervicovaginal ancillary only( [place]) - Hepatitis B surface antigen - Hepatitis C antibody - HIV Antibody (routine testing w rflx) - RPR  4. Vaginismus - Patient reports pain during intercourse- throughout with insertion and penetration, she also reports pain with pelvic examinations and use of tampons  - She has tried lubrication prior to intercourse and continues to have pain  - Has not had intercourse in 2 years due to pain  - continued vaginal spasms noted during pelvic examination and pelvic floor discomfort with bimanual examination  - Discussed assessment and management plan with Dr Alysia PennaErvin, recommends GYN PT then follow up, can use lidocaine jelly and gabapentin as well  - Ambulatory referral to Physical Therapy  5. Vaginal discharge - report discharge started occurring last month, denies current discharge but reports odor still present  - Based on physical examination and clinical symptoms will treat for BV and yeast  - Swabs pending  - tinidazole (TINDAMAX) 500 MG  tablet; Take 4 tablets (2,000 mg total) by mouth daily with breakfast. For two days  Dispense: 8 tablet; Refill: 0 - fluconazole (DIFLUCAN) 150 MG tablet; Take 1 tablet (150 mg total) by mouth daily.  Dispense: 1 tablet; Refill: 0   Will follow up results of pap smear and manage accordingly. Patient referral placed and will follow up in 3 months  Routine preventative health maintenance measures emphasized. Please refer to After Visit Summary for other counseling recommendations.        Sharyon CableVeronica C Dauna Ziska, CNM [facility]

## 2019-07-25 ENCOUNTER — Encounter: Payer: Self-pay | Admitting: Physical Therapy

## 2019-07-25 ENCOUNTER — Other Ambulatory Visit: Payer: Self-pay

## 2019-07-25 ENCOUNTER — Ambulatory Visit: Payer: Commercial Managed Care - PPO | Attending: Certified Nurse Midwife | Admitting: Physical Therapy

## 2019-07-25 DIAGNOSIS — N3941 Urge incontinence: Secondary | ICD-10-CM | POA: Diagnosis present

## 2019-07-25 DIAGNOSIS — N942 Vaginismus: Secondary | ICD-10-CM | POA: Diagnosis present

## 2019-07-25 DIAGNOSIS — R279 Unspecified lack of coordination: Secondary | ICD-10-CM | POA: Diagnosis present

## 2019-07-25 DIAGNOSIS — R252 Cramp and spasm: Secondary | ICD-10-CM

## 2019-07-25 NOTE — Patient Instructions (Addendum)
   Guided Meditation for Pelvic Floor Relaxation  [employer]  Daily massage the perineal area but no more than 3/10 pain.   [facility] [address], [place], [place] [address] Phone # [phone] Fax [phone]

## 2019-07-25 NOTE — Therapy (Signed)
[facility] [address] [place], [place], [address] Phone: [phone]   Fax:  [phone]  Physical Therapy Treatment  Patient Details  Name: May Manrique MRN: [id number] Date of Birth: [birth year] Referring Provider (PT): Dr. Steward Drone   Encounter Date: 07/25/2019  PT End of Session - 07/25/19 1240    Visit Number  1    Date for PT Re-Evaluation  10/17/19    Authorization Type  [facility]R    PT Start Time  1200    PT Stop Time  1238    PT Time Calculation (min)  38 min    Activity Tolerance  Patient tolerated treatment well;Patient limited by pain    Behavior During Therapy  [facility] for tasks assessed/performed       Past Medical History:  Diagnosis Date  . Anxiety   . Asthma   . Depression     Past Surgical History:  Procedure Laterality Date  . WISDOM TOOTH EXTRACTION      There were no vitals filed for this visit.  Subjective Assessment - 07/25/19 1200    Subjective  Patient had difficulty with tampons. Patient had lots of pain with the 2-3 times she has had intercourse. Unable to use tampons. Pain with internal exam with gynecologist. I have overactive bladder and I have urinary leakage. Patient has had to wear incontinence pads.    Patient Stated Goals  relax the pelvic floor and penile penetration, use a tampon, urge    Currently in Pain?  Yes    Pain Score  9     Pain Location  Vagina    Pain Orientation  Mid    Pain Descriptors / Indicators  Throbbing;Burning;Sharp    Pain Type  Acute pain    Pain Onset  More than a month ago    Pain Frequency  Intermittent    Aggravating Factors   penile penetration, vaginal exam, wearing a tampon    Pain Relieving Factors  no penetration of the vagina    Multiple Pain Sites  Yes    Pain Score  4    Pain Location  Back    Pain Orientation  Right;Left;Lower    Pain Descriptors / Indicators  Sore;Aching;Sharp    Pain Type  Chronic pain    Pain Onset   More than a month ago    Pain Frequency  Intermittent    Aggravating Factors   sitting, lifting    Pain Relieving Factors  ice, heat         [facility] PT Assessment - 07/25/19 0001      Assessment   Medical Diagnosis  N94.2 Vaginismus    Referring Provider (PT)  Dr. Steward Drone    Onset Date/Surgical Date  --   chronic   Prior Therapy  none; sees chiropractor for back pain      Precautions   Precautions  None      Restrictions   Weight Bearing Restrictions  No      Balance Screen   Has the patient fallen in the past 6 months  No    Has the patient had a decrease in activity level because of a fear of falling?   No    Is the patient reluctant to leave their home because of a fear of falling?   No      Home Environment   Living Environment  Private residence      Prior Function   Level of  Independence  Independent    Vocation  Full time employment    Occupational psychologist      Cognition   Overall Cognitive Status  Within Functional Limits for tasks assessed      Posture/Postural Control   Posture/Postural Control  No significant limitations      ROM / Strength   AROM / PROM / Strength  AROM;PROM;Strength      Palpation   Palpation comment  tightness on the suprapubic area; tenderness located in lumbar, bil. buttocks area                Pelvic Floor Special Questions - 07/25/19 0001    Currently Sexually Active  No   due to pain   Marinoff Scale  pain prevents any attempts at intercourse    Urinary Leakage  Yes    Pad use  1 pantyliner    Activities that cause leaking  With strong urge;Laughing;Coughing    Urinary urgency  Yes    Urinary frequency  every 1 to 1.5 hours; always a small amount    Fecal incontinence  No    Pelvic Floor Internal Exam  Patient confirms identification and approves PT  to assess pelvic floor and treatment    Exam Type  Vaginal;Rectal    Palpation  tender in the ischiocarvernosus, bulbocavernosus, levator ani; Tender  with Q-tip for all the clocks    Strength  --   unable to assess due to anxiety and pain       [facility] Adult PT Treatment/Exercise - 07/25/19 0001      Self-Care   Self-Care  Other Self-Care Comments    Other Self-Care Comments   education on ways to improve vaginal health, products that could help with the PH, creams that will assist in reduction of the irritation of the vulva area,, massaging the vulva to reduce pain and irritation             PT Education - 07/25/19 1238    Education Details  information on Good Clean love to restore the [place] level, guided pelvic floor meditation, massaging the perineum    Person(s) Educated  Patient    Methods  Explanation;Demonstration;Handout    Comprehension  Verbalized understanding;Returned demonstration       PT Short Term Goals - 07/25/19 1256      PT SHORT TERM GOAL #1   Title  independent with initial HEP    Time  4    Period  Weeks    Status  New    Target Date  08/22/19      PT SHORT TERM GOAL #2   Title  able to tolerate external palpation to the vulva area with pain level </= 3/10    Time  4    Period  Weeks    Status  New    Target Date  08/22/19      PT SHORT TERM GOAL #3   Title  using pelvic floor meditation to relax the pelvic floor and decrease her aniety    Time  4    Period  Weeks    Status  New    Target Date  08/22/19      PT SHORT TERM GOAL #4   Title  understand vaginal health and how to restore ph balance of the vagina    Time  4    Period  Weeks    Status  New    Target Date  08/22/19  PT Long Term Goals - 07/25/19 1330      PT LONG TERM GOAL #1   Title  independent with advanced HEP    Time  12    Period  Weeks    Status  New    Target Date  10/17/19      PT LONG TERM GOAL #2   Title  able to have penile penetration with Marinoff scalr 1/3 due to improve tissue elongation    Time  12    Period  Weeks    Status  New    Target Date  10/17/19      PT LONG TERM GOAL #3   Title   able to fully empty her bladder due to relaxation of the pelvic floor    Time  12    Period  Weeks    Status  New    Target Date  10/17/19      PT LONG TERM GOAL #4   Title  urinary leakage decreased >/= 80% due to decreased over active bladder activity and understands ways to calm the bladder down    Time  12    Period  Weeks    Status  New    Target Date  10/17/19      PT LONG TERM GOAL #5   Title  not having to wear a pad due to reduction of urinary leakage    Time  12    Period  Weeks    Status  New    Target Date  10/17/19            Plan - 07/25/19 1243    Clinical Impression Statement  Patient is a 23 year old female with vaginismus. Patient reports her vaginal pain is 8/10 when she has penetration into the vaginal canal. When patient has a vaginal exam the doctor will use the smallest speculum. During the Qtip test pain at all areas of the vulva and she had some redness. Patient was not able to tolerate an internal vaginal exam. Patient reports intermittent lumbar pain at level 5/10 with sitting.  Patient reports she has urge incontinence and has to wear 1 pantyliner per day due to urinary leakage. Patient has to go to the bathroom every 1.5 hours. Patient will leak urine with the urge to urinate, coughing, and sneezing. Patient has tenderness located in the lumbar, bilateral gluteal, ischiocavernosus, and bulbocavernosus. Tight fascial in the suprapubic area. Patient will benefit from skilled therapy to improve pelvic floor coordination, reduce pain, and improve function.    Personal Factors and Comorbidities  Sex;Profession    Examination-Activity Limitations  Continence;Toileting    Examination-Participation Restrictions  Interpersonal Relationship    Stability/Clinical Decision Making  Evolving/Moderate complexity    Clinical Decision Making  Low    Rehab Potential  Excellent    PT Frequency  1x / week    PT Duration  12 weeks    PT Treatment/Interventions   Biofeedback;Cryotherapy;Electrical Stimulation;Moist Heat;Ultrasound;Therapeutic exercise;Therapeutic activities;Neuromuscular re-education;Patient/family education;Dry needling;Manual techniques    PT Next Visit Plan  facial release of the diaphragm and urogenital area, hip stretches, diaphragmatic breathing,    Consulted and Agree with Plan of Care  Patient       Patient will benefit from skilled therapeutic intervention in order to improve the following deficits and impairments:  Decreased coordination, Decreased range of motion, Increased fascial restricitons, Increased muscle spasms, Decreased activity tolerance, Pain, Impaired flexibility, Decreased strength, Decreased mobility  Visit Diagnosis: Cramp and spasm -  Plan: PT plan of care cert/re-cert  Urge incontinence of urine - Plan: PT plan of care cert/re-cert  Vaginismus - Plan: PT plan of care cert/re-cert  Unspecified lack of coordination - Plan: PT plan of care cert/re-cert     Problem List Patient Active Problem List   Diagnosis Date Noted  . Anxiety and depression 07/07/2019  . Mild intermittent asthma without complication 07/07/2019  . Seasonal allergies 07/07/2019  . Plantar fasciitis 07/07/2019  . Hyperhidrosis 07/07/2019  . OAB (overactive bladder) 07/07/2019  . Allergy 08/06/2018    Eulis Fosterheryl Yannis Broce, PT 07/25/19 1:37 PM   [facility] [address], [address] [place], [place], [address] Phone: [phone]   Fax:  [phone]  Name: Efraim Kaufmannlana Dionne Galster MRN: [id number] Date of Birth: [birth year]

## 2019-08-02 ENCOUNTER — Ambulatory Visit: Payer: Commercial Managed Care - PPO | Attending: Certified Nurse Midwife | Admitting: Physical Therapy

## 2019-08-02 ENCOUNTER — Encounter: Payer: Self-pay | Admitting: Physical Therapy

## 2019-08-02 ENCOUNTER — Other Ambulatory Visit: Payer: Self-pay

## 2019-08-02 DIAGNOSIS — R252 Cramp and spasm: Secondary | ICD-10-CM | POA: Diagnosis present

## 2019-08-02 DIAGNOSIS — N942 Vaginismus: Secondary | ICD-10-CM

## 2019-08-02 DIAGNOSIS — R279 Unspecified lack of coordination: Secondary | ICD-10-CM | POA: Diagnosis present

## 2019-08-02 DIAGNOSIS — N3941 Urge incontinence: Secondary | ICD-10-CM

## 2019-08-02 NOTE — Patient Instructions (Signed)
Access Code: [email or web address]  URL: [email or web address]  Date: 08/02/2019  Prepared by: Earlie Counts   Exercises Supine Figure 4 Piriformis Stretch - 1 reps - 1 sets - 30 sec hold - 1x daily - 7x weekly Supine Single Knee to Chest - 1 reps - 1 sets - 30 sec hold - 1x daily - 7x weekly Supine Hamstring Stretch with Strap - 1 reps - 1 sets - 30 sec hold - 1x daily - 7x weekly Quadruped Rocking Slow - 10 reps - 1 sets - 1x daily - 7x weekly Supine Butterfly Groin Stretch - 1 reps - 1 sets - 1 min hold - 1x daily - 7x weekly Quadruped Cat Camel - 10 reps - 1 sets - 1x daily - 7x weekly Child's Pose Stretch - 5 reps - 1 sets - 10 sec hold - 1x daily - 7x weekly Patient Education Trigger Point Dry Needling

## 2019-08-02 NOTE — Therapy (Signed)
[facility] [address], [address] [place], [place], [address] Phone: [phone]   Fax:  [phone]  Physical Therapy Treatment  Patient Details  Name: Brianna Kaufmannlana Dionne Rinks MRN: [id number] Date of Birth: [birth year] Referring Provider (PT): Dr. Steward DroneVeronica Rogers   Encounter Date: 08/02/2019  PT End of Session - 08/02/19 0845    Visit Number  2    Date for PT Re-Evaluation  10/17/19    Authorization Type  [facility]R    PT Start Time  0845    PT Stop Time  0925    PT Time Calculation (min)  40 min    Activity Tolerance  Patient tolerated treatment well;Patient limited by pain    Behavior During Therapy  [facility] for tasks assessed/performed       Past Medical History:  Diagnosis Date  . Anxiety   . Asthma   . Depression     Past Surgical History:  Procedure Laterality Date  . WISDOM TOOTH EXTRACTION      There were no vitals filed for this visit.  Subjective Assessment - 08/02/19 0847    Subjective  I felt pretty good after the initial evaluation. The HEP is doing well.    Patient Stated Goals  relax the pelvic floor and penile penetration, use a tampon, urge    Currently in Pain?  Yes    Pain Score  9     Pain Location  Vagina    Pain Orientation  Mid    Pain Descriptors / Indicators  Burning;Throbbing;Sharp    Pain Type  Acute pain    Pain Onset  More than a month ago    Pain Frequency  Intermittent    Aggravating Factors   penile penetration, vaginal exam, wearing a tampon    Pain Relieving Factors  no penetration of the vagina    Multiple Pain Sites  No                       [facility] Adult PT Treatment/Exercise - 08/02/19 0001      Self-Care   Self-Care  Other Self-Care Comments    Other Self-Care Comments   education on how to use the q-tip to work on vaginal penetration and using the desert harvest revelum      Lumbar Exercises: Stretches   Active Hamstring Stretch  Right;Left;1 rep;30 seconds    supine   Active Hamstring Stretch Limitations  used a strap    Single Knee to Chest Stretch  Right;Left;1 rep;30 seconds    Prone on Elbows Stretch  1 rep;20 seconds    Piriformis Stretch  Right;Left;1 rep;30 seconds   supine   Other Lumbar Stretch Exercise  butterfly stretch holding 1 min    Other Lumbar Stretch Exercise  diaphragmatic breathing       Lumbar Exercises: Quadruped   [place]  15 reps    Other Quadruped Lumbar Exercises  rock back and forth      Manual Therapy   Manual Therapy  Soft tissue mobilization    Soft tissue mobilization  using the addaday to the lumbar paraspinals; abdominal massage and working on the diaphgram to decrease trigger points and improve breathing       Trigger Point Dry Needling - 08/02/19 0001    Consent Given?  Yes    Education Handout Provided  Yes    Muscles Treated Back/Hip  Lumbar multifidi    Lumbar multifidi Response  Twitch response elicited;Palpable increased  muscle length           PT Education - 08/02/19 0915    Education Details  Access Code: [email or web address]; diaphragmatic breathing, how to use the q-tip to be inserted into the vaginal to work on painless penetration    Person(s) Educated  Patient    Methods  Explanation;Demonstration;Verbal cues;Handout    Comprehension  Verbalized understanding;Returned demonstration       PT Short Term Goals - 07/25/19 1256      PT SHORT TERM GOAL #1   Title  independent with initial HEP    Time  4    Period  Weeks    Status  New    Target Date  08/22/19      PT SHORT TERM GOAL #2   Title  able to tolerate external palpation to the vulva area with pain level </= 3/10    Time  4    Period  Weeks    Status  New    Target Date  08/22/19      PT SHORT TERM GOAL #3   Title  using pelvic floor meditation to relax the pelvic floor and decrease her aniety    Time  4    Period  Weeks    Status  New    Target Date  08/22/19      PT SHORT TERM GOAL #4   Title  understand  vaginal health and how to restore ph balance of the vagina    Time  4    Period  Weeks    Status  New    Target Date  08/22/19        PT Long Term Goals - 07/25/19 1330      PT LONG TERM GOAL #1   Title  independent with advanced HEP    Time  12    Period  Weeks    Status  New    Target Date  10/17/19      PT LONG TERM GOAL #2   Title  able to have penile penetration with Marinoff scalr 1/3 due to improve tissue elongation    Time  12    Period  Weeks    Status  New    Target Date  10/17/19      PT LONG TERM GOAL #3   Title  able to fully empty her bladder due to relaxation of the pelvic floor    Time  12    Period  Weeks    Status  New    Target Date  10/17/19      PT LONG TERM GOAL #4   Title  urinary leakage decreased >/= 80% due to decreased over active bladder activity and understands ways to calm the bladder down    Time  12    Period  Weeks    Status  New    Target Date  10/17/19      PT LONG TERM GOAL #5   Title  not having to wear a pad due to reduction of urinary leakage    Time  12    Period  Weeks    Status  New    Target Date  10/17/19            Plan - 08/02/19 0930    Clinical Impression Statement  Patient is able to do perineal soft tissue work externally without pain higher than 3/10. Patient had trigger points in the lumbar and abdomen. Patient needs VC  for diaphragmatic breathing. Patient was able to go further in the childs pose after dry needling. Patient has tight hips making it difficult with single knee to chest. Patient will benefit from skilled therapy to improve pelvic floor coordination, reduce pain, and improve function.    Personal Factors and Comorbidities  Sex;Profession    Examination-Activity Limitations  Continence;Toileting    Examination-Participation Restrictions  Interpersonal Relationship    Stability/Clinical Decision Making  Evolving/Moderate complexity    Rehab Potential  Excellent    PT Frequency  1x / week    PT  Duration  12 weeks    PT Treatment/Interventions  Biofeedback;Cryotherapy;Electrical Stimulation;Moist Heat;Ultrasound;Therapeutic exercise;Therapeutic activities;Neuromuscular re-education;Patient/family education;Dry needling;Manual techniques    PT Next Visit Plan  facial release of the diaphragm and urogenital area,  diaphragmatic breathing, dry needlie to hip adductors, foam roll to hips    PT Home Exercise Plan  Access Code: [email or web address]    Recommended Other Services  MD signed initial eval    Consulted and Agree with Plan of Care  Patient       Patient will benefit from skilled therapeutic intervention in order to improve the following deficits and impairments:  Decreased coordination, Decreased range of motion, Increased fascial restricitons, Increased muscle spasms, Decreased activity tolerance, Pain, Impaired flexibility, Decreased strength, Decreased mobility  Visit Diagnosis: Cramp and spasm  Urge incontinence of urine  Vaginismus  Unspecified lack of coordination     Problem List Patient Active Problem List   Diagnosis Date Noted  . Anxiety and depression 07/07/2019  . Mild intermittent asthma without complication 07/07/2019  . Seasonal allergies 07/07/2019  . Plantar fasciitis 07/07/2019  . Hyperhidrosis 07/07/2019  . OAB (overactive bladder) 07/07/2019  . Allergy 08/06/2018    Eulis Foster, PT 08/02/19 9:34 AM   [facility] [address] [place], [place], [address] Phone: [phone]   Fax:  [phone]  Name: Symone Cornman MRN: [id number] Date of Birth: [birth year]

## 2019-08-10 ENCOUNTER — Ambulatory Visit: Payer: Commercial Managed Care - PPO | Admitting: Physical Therapy

## 2019-08-10 ENCOUNTER — Encounter: Payer: Self-pay | Admitting: Physical Therapy

## 2019-08-10 ENCOUNTER — Other Ambulatory Visit: Payer: Self-pay

## 2019-08-10 DIAGNOSIS — R252 Cramp and spasm: Secondary | ICD-10-CM

## 2019-08-10 DIAGNOSIS — N942 Vaginismus: Secondary | ICD-10-CM

## 2019-08-10 DIAGNOSIS — R279 Unspecified lack of coordination: Secondary | ICD-10-CM

## 2019-08-10 DIAGNOSIS — N3941 Urge incontinence: Secondary | ICD-10-CM

## 2019-08-10 NOTE — Therapy (Signed)
[facility] [address] [place], [place], [address] Phone: [phone]   Fax:  [phone]  Physical Therapy Treatment  Patient Details  Name: Brianna Barr MRN: [id number] Date of Birth: Jul 04, 1996 Referring Provider (PT): Dr. Steward Drone   Encounter Date: 08/10/2019  PT End of Session - 08/10/19 1445    Visit Number  3    Date for PT Re-Evaluation  10/17/19    Authorization Type  UMR    PT Start Time  1400    PT Stop Time  1445    PT Time Calculation (min)  45 min    Activity Tolerance  Patient tolerated treatment well;Patient limited by pain    Behavior During Therapy  [facility] for tasks assessed/performed       Past Medical History:  Diagnosis Date  . Anxiety   . Asthma   . Depression     Past Surgical History:  Procedure Laterality Date  . WISDOM TOOTH EXTRACTION      There were no vitals filed for this visit.  Subjective Assessment - 08/10/19 1406    Subjective  Things doing okay. One time with internal work I stopped due to more than 3/10.    Patient Stated Goals  relax the pelvic floor and penile penetration, use a tampon, urge    Currently in Pain?  Yes    Pain Score  9     Pain Location  Vagina    Pain Orientation  Mid    Pain Descriptors / Indicators  Burning;Throbbing;Sharp    Pain Type  Acute pain    Pain Onset  More than a month ago    Pain Frequency  Intermittent    Aggravating Factors   penile penetration, vaginal exam, wearing a tampon    Pain Relieving Factors  no penetration of the vagina    Multiple Pain Sites  Yes    Pain Score  6    Pain Location  Back    Pain Orientation  Right;Left;Lower    Pain Descriptors / Indicators  Aching;Sharp;Sore    Pain Type  Chronic pain    Pain Onset  More than a month ago    Pain Frequency  Intermittent    Aggravating Factors   sitting, lifting    Pain Relieving Factors  ice, heat                       [facility] Adult  PT Treatment/Exercise - 08/10/19 0001      Self-Care   Self-Care  Other Self-Care Comments    Other Self-Care Comments   educated patient on how to progress using the cotton ball, using the pelvic floor meditation with q-tip, looking over the websites for vaginal dilator      Lumbar Exercises: Stretches   Quad Stretch  Right;Left;60 seconds   prone on foam roll   Piriformis Stretch  Right;1 rep;10 seconds   with foam roll but too difficulty   Other Lumbar Stretch Exercise  diaphragmatic breathing while on the foam roll with legs in butterfly position      Manual Therapy   Manual Therapy  Soft tissue mobilization    Soft tissue mobilization  bilateral hip adductor; bilteral lumbar paraspinals with addaday       Trigger Point Dry Needling - 08/10/19 0001    Consent Given?  Yes    Education Handout Provided  Previously provided    Muscles Treated Lower Quadrant  Adductor  longus/brevis/magnus    Muscles Treated Back/Hip  Lumbar multifidi    Adductor Response  Twitch response elicited;Palpable increased muscle length    Lumbar multifidi Response  Twitch response elicited;Palpable increased muscle length           PT Education - 08/10/19 1445    Education Details  how to progress with the cotton ball and using pelvic floor mediation with q-tip, abdominal breathing with bulging of the pelvic floor    Person(s) Educated  Patient    Methods  Explanation;Demonstration;Handout    Comprehension  Returned demonstration;Verbalized understanding       PT Short Term Goals - 08/10/19 1450      PT SHORT TERM GOAL #1   Title  independent with initial HEP    Time  4    Period  Weeks    Status  Achieved      PT SHORT TERM GOAL #2   Title  able to tolerate external palpation to the vulva area with pain level </= 3/10    Baseline  working on it    Time  4    Period  Weeks    Status  On-going    Target Date  08/22/19      PT SHORT TERM GOAL #3   Title  using pelvic floor meditation  to relax the pelvic floor and decrease her aniety    Time  4    Period  Weeks    Status  Achieved    Target Date  08/22/19      PT SHORT TERM GOAL #4   Title  understand vaginal health and how to restore ph balance of the vagina    Time  4    Period  Weeks    Status  On-going    Target Date  08/22/19        PT Long Term Goals - 07/25/19 1330      PT LONG TERM GOAL #1   Title  independent with advanced HEP    Time  12    Period  Weeks    Status  New    Target Date  10/17/19      PT LONG TERM GOAL #2   Title  able to have penile penetration with Marinoff scalr 1/3 due to improve tissue elongation    Time  12    Period  Weeks    Status  New    Target Date  10/17/19      PT LONG TERM GOAL #3   Title  able to fully empty her bladder due to relaxation of the pelvic floor    Time  12    Period  Weeks    Status  New    Target Date  10/17/19      PT LONG TERM GOAL #4   Title  urinary leakage decreased >/= 80% due to decreased over active bladder activity and understands ways to calm the bladder down    Time  12    Period  Weeks    Status  New    Target Date  10/17/19      PT LONG TERM GOAL #5   Title  not having to wear a pad due to reduction of urinary leakage    Time  12    Period  Weeks    Status  New    Target Date  10/17/19            Plan - 08/10/19 1446  Clinical Impression Statement  Patient has had 2 times with pain level >/ 3/10 since initial eval with the q-tip. Patient does well with the dry needling to relax the back and pelvic floor. Patient tried the foam roll for the msucles but difficult with her body type. Patient will benefit from skilled therapy to reduce the tension around the pelvic floor and increase the comfort level with the therapist working around the perineal area.    Personal Factors and Comorbidities  Sex;Profession    Examination-Activity Limitations  Continence;Toileting    Examination-Participation Restrictions  Interpersonal  Relationship    Stability/Clinical Decision Making  Evolving/Moderate complexity    Rehab Potential  Excellent    PT Frequency  1x / week    PT Duration  12 weeks    PT Treatment/Interventions  Biofeedback;Cryotherapy;Electrical Stimulation;Moist Heat;Ultrasound;Therapeutic exercise;Therapeutic activities;Neuromuscular re-education;Patient/family education;Dry needling;Manual techniques    PT Next Visit Plan  diaphragmatic breathing, dry needlie to hip adductors, release around the vaginal perineum; review vaginal health    PT Home Exercise Plan  Access Code: [email or web address]    Consulted and Agree with Plan of Care  Patient       Patient will benefit from skilled therapeutic intervention in order to improve the following deficits and impairments:  Decreased coordination, Decreased range of motion, Increased fascial restricitons, Increased muscle spasms, Decreased activity tolerance, Pain, Impaired flexibility, Decreased strength, Decreased mobility  Visit Diagnosis: Cramp and spasm  Urge incontinence of urine  Vaginismus  Unspecified lack of coordination     Problem List Patient Active Problem List   Diagnosis Date Noted  . Anxiety and depression 07/07/2019  . Mild intermittent asthma without complication 61/60/7371  . Seasonal allergies 07/07/2019  . Plantar fasciitis 07/07/2019  . Hyperhidrosis 07/07/2019  . OAB (overactive bladder) 07/07/2019  . Allergy 08/06/2018    Earlie Counts, PT 08/10/19 2:52 PM   [facility] [address] [place], [place], [address] Phone: [phone]   Fax:  [phone]  Name: Khamia Stambaugh MRN: [id number] Date of Birth: April 20, 1996

## 2019-08-10 NOTE — Patient Instructions (Addendum)
Try pelvic floor meditation from you tube while using the q-tip  Start using a cotton around the vulva for 1 min, then use the q-tip while bulging the pelvic floor with breath  websites with dilators [email or web address] [employer].[facility] [address], [place], [place] [address] Phone # [phone] Fax [phone]

## 2019-08-15 ENCOUNTER — Ambulatory Visit: Payer: Commercial Managed Care - PPO | Admitting: Physical Therapy

## 2019-08-15 ENCOUNTER — Encounter: Payer: Self-pay | Admitting: Physical Therapy

## 2019-08-15 ENCOUNTER — Other Ambulatory Visit: Payer: Self-pay

## 2019-08-15 DIAGNOSIS — R252 Cramp and spasm: Secondary | ICD-10-CM

## 2019-08-15 DIAGNOSIS — N3941 Urge incontinence: Secondary | ICD-10-CM

## 2019-08-15 DIAGNOSIS — R279 Unspecified lack of coordination: Secondary | ICD-10-CM

## 2019-08-15 DIAGNOSIS — N942 Vaginismus: Secondary | ICD-10-CM

## 2019-08-15 NOTE — Therapy (Signed)
[facility] [address] [place], [place], [address] Phone: [phone]   Fax:  [phone]  Physical Therapy Treatment  Patient Details  Name: Brianna Barr MRN: [id number] Date of Birth: Jun 22, 1996 Referring Provider (PT): Dr. Steward Drone   Encounter Date: 08/15/2019  PT End of Session - 08/15/19 0844    Visit Number  4    Date for PT Re-Evaluation  10/17/19    Authorization Type  UMR    PT Start Time  0800    PT Stop Time  0840    PT Time Calculation (min)  40 min    Activity Tolerance  Patient tolerated treatment well;Patient limited by pain    Behavior During Therapy  [facility] for tasks assessed/performed       Past Medical History:  Diagnosis Date  . Anxiety   . Asthma   . Depression     Past Surgical History:  Procedure Laterality Date  . WISDOM TOOTH EXTRACTION      There were no vitals filed for this visit.  Subjective Assessment - 08/15/19 0804    Subjective  The internal work is helping. The dry needling has helped. The inner thigh dry neediling helped.    Patient Stated Goals  relax the pelvic floor and penile penetration, use a tampon, urge    Currently in Pain?  Yes    Pain Score  9     Pain Location  Vagina    Pain Orientation  Mid    Pain Descriptors / Indicators  Burning;Throbbing;Sharp    Pain Type  Acute pain    Pain Onset  More than a month ago    Pain Frequency  Intermittent    Aggravating Factors   penile penetration, vaginal exam, wearing a tampon    Pain Relieving Factors  no penetration of the vagina    Multiple Pain Sites  Yes    Pain Score  3    Pain Location  Back    Pain Orientation  Right;Left;Lower    Pain Descriptors / Indicators  Aching;Sharp;Sore    Pain Type  Chronic pain    Pain Onset  More than a month ago    Pain Frequency  Intermittent    Aggravating Factors   sitting, lifting    Pain Relieving Factors  ice, heat                     Pelvic Floor Special Questions - 08/15/19 0001    Pelvic Floor Internal Exam  Patient confirms identification and approves PT  to assess pelvic floor and treatment    Exam Type  Vaginal        [facility] Adult PT Treatment/Exercise - 08/15/19 0001      Lumbar Exercises: Stretches   Passive Hamstring Stretch  Right;Left;1 rep;30 seconds   legs on wall   Quadruped Mid Back Stretch  1 rep;60 seconds    Quadruped Mid Back Stretch Limitations  using a bolster for the arms, addaday to the gluteal    Piriformis Stretch  Right;Left;1 rep;60 seconds   pigeon pose   Piriformis Stretch Limitations  using addaday on the piriformis    Other Lumbar Stretch Exercise  sitting hip adductor stretch    Other Lumbar Stretch Exercise  supine squat on wall stretch      Manual Therapy   Manual Therapy  Soft tissue mobilization;Internal Pelvic Floor    Manual therapy comments  instructed patient on how to  start her vaginal stretch without dilator    Soft tissue mobilization  bilateral ischiocavernosus and bulbocavernosus    Internal Pelvic Floor  alont the introitus especially posteriorly with monitoring pain leve lto not be higher than 3/10             PT Education - 08/15/19 0843    Education Details  Access Code: [email or web address]; stretch of the vaginal canal    Person(s) Educated  Patient    Methods  Explanation;Demonstration;Verbal cues;Handout    Comprehension  Returned demonstration;Verbalized understanding       PT Short Term Goals - 08/15/19 0846      PT SHORT TERM GOAL #1   Title  independent with initial HEP    Time  4    Period  Weeks    Status  Achieved      PT SHORT TERM GOAL #2   Title  able to tolerate external palpation to the vulva area with pain level </= 3/10    Time  4    Period  Weeks    Status  Achieved        PT Long Term Goals - 07/25/19 1330      PT LONG TERM GOAL #1   Title  independent with advanced HEP    Time  12    Period  Weeks     Status  New    Target Date  10/17/19      PT LONG TERM GOAL #2   Title  able to have penile penetration with Marinoff scalr 1/3 due to improve tissue elongation    Time  12    Period  Weeks    Status  New    Target Date  10/17/19      PT LONG TERM GOAL #3   Title  able to fully empty her bladder due to relaxation of the pelvic floor    Time  12    Period  Weeks    Status  New    Target Date  10/17/19      PT LONG TERM GOAL #4   Title  urinary leakage decreased >/= 80% due to decreased over active bladder activity and understands ways to calm the bladder down    Time  12    Period  Weeks    Status  New    Target Date  10/17/19      PT LONG TERM GOAL #5   Title  not having to wear a pad due to reduction of urinary leakage    Time  12    Period  Weeks    Status  New    Target Date  10/17/19            Plan - 08/15/19 0816    Clinical Impression Statement  Patient was able to tolerate therapist to place her index finger into the introitus 2 cm with no more than 3/10 pain. Patient is not abl eto tolerate the circular motion. Patient will be ordering the vaginal dilators. Patient will be working on stretching the vaginal introitus at home. Patient has tight hips. Patient will benefit from skilled therapy to improve tension around the pelic floor and increase the comfort level with therapist working around the perineal area.    Personal Factors and Comorbidities  Sex;Profession    Examination-Activity Limitations  Continence;Toileting    Examination-Participation Restrictions  Interpersonal Relationship    Stability/Clinical Decision Making  Evolving/Moderate complexity    Rehab Potential  Excellent  PT Frequency  1x / week    PT Duration  12 weeks    PT Treatment/Interventions  Biofeedback;Cryotherapy;Electrical Stimulation;Moist Heat;Ultrasound;Therapeutic exercise;Therapeutic activities;Neuromuscular re-education;Patient/family education;Dry needling;Manual techniques     PT Next Visit Plan  diaphragmatic breathing, dry needlie to hip adductors, release around the vaginal perineum    PT Home Exercise Plan  Access Code: [email or web address]    Consulted and Agree with Plan of Care  Patient       Patient will benefit from skilled therapeutic intervention in order to improve the following deficits and impairments:  Decreased coordination, Decreased range of motion, Increased fascial restricitons, Increased muscle spasms, Decreased activity tolerance, Pain, Impaired flexibility, Decreased strength, Decreased mobility  Visit Diagnosis: Cramp and spasm  Urge incontinence of urine  Vaginismus  Unspecified lack of coordination     Problem List Patient Active Problem List   Diagnosis Date Noted  . Anxiety and depression 07/07/2019  . Mild intermittent asthma without complication 07/07/2019  . Seasonal allergies 07/07/2019  . Plantar fasciitis 07/07/2019  . Hyperhidrosis 07/07/2019  . OAB (overactive bladder) 07/07/2019  . Allergy 08/06/2018    Eulis Fosterheryl Janalynn Eder, PT 08/15/19 8:48 AM    [facility] [address] [place], [place], [address] Phone: [phone]   Fax:  [phone]  Name: Efraim Kaufmannlana Dionne Comella MRN: [id number] Date of Birth: 1995-11-17

## 2019-08-15 NOTE — Patient Instructions (Addendum)
STRETCHING THE PELVIC FLOOR MUSCLES NO DILATOR  Supplies . Vaginal lubricant . Mirror (optional) . Gloves (optional) Positioning . Start in a semi-reclined position with your head propped up. Bend your knees and place your thumb or finger at the vaginal opening. Procedure . Apply a moderate amount of lubricant on the outer skin of your vagina, the labia minora.  Apply additional lubricant to your finger. Marland Kitchen Spread the skin away from the vaginal opening. Place the end of your finger at the opening. . Do a maximum contraction of the pelvic floor muscles. Tighten the vagina and the anus maximally and relax. . When you know they are relaxed, gently and slowly insert your finger into your vagina, directing your finger slightly downward, for 2-3 inches of insertion. . Relax and stretch the 6 o'clock position . Hold each stretch for _2 min__ and repeat __1_ time with rest breaks of _1__ seconds between each stretch. . Repeat the stretching in the 4 o'clock and 8 o'clock positions. . Total time should be _6__ minutes, _1__ x per day.  Note the amount of theme your were able to achieve and your tolerance to your finger in your vagina. . Once you have accomplished the techniques you may try them in standing with one foot resting on the tub, or in other positions.  This is a good stretch to do in the shower if you don't need to use lubricant.  [facility] [address], [place], [place] [address] Phone # [phone] Fax [phone] Access Code: [email or web address]  URL: [email or web address]  Date: 08/15/2019  Prepared by: Earlie Counts   Exercises Supine Figure 4 Piriformis Stretch - 1 reps - 1 sets - 30 sec hold - 1x daily - 7x weekly Supine Single Knee to Chest - 1 reps - 1 sets - 30 sec hold - 1x daily - 7x weekly Supine Hamstring Stretch with Strap - 1 reps - 1 sets - 30 sec hold - 1x daily - 7x weekly Quadruped Rocking Slow - 10 reps - 1 sets - 1x daily - 7x  weekly Supine Butterfly Groin Stretch - 1 reps - 1 sets - 1 min hold - 1x daily - 7x weekly Quadruped Cat Camel - 10 reps - 1 sets - 1x daily - 7x weekly Child's Pose Stretch - 5 reps - 1 sets - 10 sec hold - 1x daily - 7x weekly Supine Diaphragmatic Breathing - 10 reps - 1 sets - 1x daily - 7x weekly Double Leg Hamstring Stretch at Wall - 1 reps - 1 sets - 1 min hold - 1x daily - 7x weekly Pigeon Pose - 1 reps - 1 sets - 30 sec hold - 1x daily - 7x weekly Patient Education Trigger Point Dry Needling

## 2019-08-18 ENCOUNTER — Encounter: Payer: Self-pay | Admitting: Family Medicine

## 2019-08-18 ENCOUNTER — Ambulatory Visit: Payer: Commercial Managed Care - PPO | Admitting: Family Medicine

## 2019-08-18 ENCOUNTER — Other Ambulatory Visit: Payer: Self-pay

## 2019-08-18 VITALS — BP 98/78 | HR 92 | Temp 97.7°F | Wt 232.0 lb

## 2019-08-18 DIAGNOSIS — F329 Major depressive disorder, single episode, unspecified: Secondary | ICD-10-CM | POA: Diagnosis not present

## 2019-08-18 DIAGNOSIS — Z6841 Body Mass Index (BMI) 40.0 and over, adult: Secondary | ICD-10-CM

## 2019-08-18 DIAGNOSIS — F419 Anxiety disorder, unspecified: Secondary | ICD-10-CM | POA: Diagnosis not present

## 2019-08-18 DIAGNOSIS — F32A Depression, unspecified: Secondary | ICD-10-CM

## 2019-08-18 DIAGNOSIS — Z862 Personal history of diseases of the blood and blood-forming organs and certain disorders involving the immune mechanism: Secondary | ICD-10-CM | POA: Diagnosis not present

## 2019-08-18 LAB — T4, FREE: Free T4: 0.78 ng/dL (ref 0.60–1.60)

## 2019-08-18 LAB — BASIC METABOLIC PANEL
BUN: 11 mg/dL (ref 6–23)
CO2: 24 mEq/L (ref 19–32)
Calcium: 9.2 mg/dL (ref 8.4–10.5)
Chloride: 106 mEq/L (ref 96–112)
Creatinine, Ser: 0.86 mg/dL (ref 0.40–1.20)
GFR: 98.57 mL/min (ref >60.00)
Glucose, Bld: 84 mg/dL (ref 70–99)
Potassium: 4 mEq/L (ref 3.5–5.1)
Sodium: 140 mEq/L (ref 135–145)

## 2019-08-18 LAB — CBC
HCT: 38.6 % (ref 36.0–46.0)
Hemoglobin: 12.7 g/dL (ref 12.0–15.0)
MCHC: 32.9 g/dL (ref 30.0–36.0)
MCV: 82.1 fl (ref 78.0–100.0)
Platelets: 411 10*3/uL — ABNORMAL HIGH (ref 150.0–400.0)
RBC: 4.71 Mil/uL (ref 3.87–5.11)
RDW: 13.7 % (ref 11.5–15.5)
WBC: 8.5 10*3/uL (ref 4.0–10.5)

## 2019-08-18 LAB — VITAMIN D 25 HYDROXY (VIT D DEFICIENCY, FRACTURES): VITD: 8.85 ng/mL — ABNORMAL LOW (ref 30.00–100.00)

## 2019-08-18 LAB — HEMOGLOBIN A1C: Hgb A1c MFr Bld: 5.4 % (ref 4.6–6.5)

## 2019-08-18 LAB — TSH: TSH: 2.38 u[IU]/mL (ref 0.35–4.50)

## 2019-08-18 MED ORDER — VILAZODONE HCL 10 MG PO TABS: 10.0000 mg | ORAL_TABLET | Freq: Every day | ORAL | 2 refills | Status: DC

## 2019-08-18 NOTE — Patient Instructions (Signed)
Living With Anxiety  After being diagnosed with an anxiety disorder, you may be relieved to know why you have felt or behaved a certain way. It is natural to also feel overwhelmed about the treatment ahead and what it will mean for your life. With care and support, you can manage this condition and recover from it. How to cope with anxiety Dealing with stress Stress is your body's reaction to life changes and events, both good and bad. Stress can last just a few hours or it can be ongoing. Stress can play a major role in anxiety, so it is important to learn both how to cope with stress and how to think about it differently. Talk with your health care provider or a counselor to learn more about stress reduction. He or she may suggest some stress reduction techniques, such as:  Music therapy. This can include creating or listening to music that you enjoy and that inspires you.  Mindfulness-based meditation. This involves being aware of your normal breaths, rather than trying to control your breathing. It can be done while sitting or walking.  Centering prayer. This is a kind of meditation that involves focusing on a word, phrase, or sacred image that is meaningful to you and that brings you peace.  Deep breathing. To do this, expand your stomach and inhale slowly through your nose. Hold your breath for 3-5 seconds. Then exhale slowly, allowing your stomach muscles to relax.  Self-talk. This is a skill where you identify thought patterns that lead to anxiety reactions and correct those thoughts.  Muscle relaxation. This involves tensing muscles then relaxing them. Choose a stress reduction technique that fits your lifestyle and personality. Stress reduction techniques take time and practice. Set aside 5-15 minutes a day to do them. Therapists can offer training in these techniques. The training may be covered by some insurance plans. Other things you can do to manage stress include:  Keeping a  stress diary. This can help you learn what triggers your stress and ways to control your response.  Thinking about how you respond to certain situations. You may not be able to control everything, but you can control your reaction.  Making time for activities that help you relax, and not feeling guilty about spending your time in this way. Therapy combined with coping and stress-reduction skills provides the best chance for successful treatment. Medicines Medicines can help ease symptoms. Medicines for anxiety include:  Anti-anxiety drugs.  Antidepressants.  Beta-blockers. Medicines may be used as the main treatment for anxiety disorder, along with therapy, or if other treatments are not working. Medicines should be prescribed by a health care provider. Relationships Relationships can play a big part in helping you recover. Try to spend more time connecting with trusted friends and family members. Consider going to couples counseling, taking family education classes, or going to family therapy. Therapy can help you and others better understand the condition. How to recognize changes in your condition Everyone has a different response to treatment for anxiety. Recovery from anxiety happens when symptoms decrease and stop interfering with your daily activities at home or work. This may mean that you will start to:  Have better concentration and focus.  Sleep better.  Be less irritable.  Have more energy.  Have improved memory. It is important to recognize when your condition is getting worse. Contact your health care provider if your symptoms interfere with home or work and you do not feel like your condition is improving. Where   to find help and support: You can get help and support from these sources:  Self-help groups.  Online and community organizations.  A trusted spiritual leader.  Couples counseling.  Family education classes.  Family therapy. Follow these instructions  at home:  Eat a healthy diet that includes plenty of vegetables, fruits, whole grains, low-fat dairy products, and lean protein. Do not eat a lot of foods that are high in solid fats, added sugars, or salt.  Exercise. Most adults should do the following: ? Exercise for at least 150 minutes each week. The exercise should increase your heart rate and make you sweat (moderate-intensity exercise). ? Strengthening exercises at least twice a week.  Cut down on caffeine, tobacco, alcohol, and other potentially harmful substances.  Get the right amount and quality of sleep. Most adults need 7-9 hours of sleep each night.  Make choices that simplify your life.  Take over-the-counter and prescription medicines only as told by your health care provider.  Avoid caffeine, alcohol, and certain over-the-counter cold medicines. These may make you feel worse. Ask your pharmacist which medicines to avoid.  Keep all follow-up visits as told by your health care provider. This is important. Questions to ask your health care provider  Would I benefit from therapy?  How often should I follow up with a health care provider?  How long do I need to take medicine?  Are there any long-term side effects of my medicine?  Are there any alternatives to taking medicine? Contact a health care provider if:  You have a hard time staying focused or finishing daily tasks.  You spend many hours a day feeling worried about everyday life.  You become exhausted by worry.  You start to have headaches, feel tense, or have nausea.  You urinate more than normal.  You have diarrhea. Get help right away if:  You have a racing heart and shortness of breath.  You have thoughts of hurting yourself or others. If you ever feel like you may hurt yourself or others, or have thoughts about taking your own life, get help right away. You can go to your nearest emergency department or call:  Your local emergency services  (911 in the [place]  A suicide crisis helpline, such as the National Suicide Prevention Lifeline at 1-800-273-8255. This is open 24-hours a day. Summary  Taking steps to deal with stress can help calm you.  Medicines cannot cure anxiety disorders, but they can help ease symptoms.  Family, friends, and partners can play a big part in helping you recover from an anxiety disorder. This information is not intended to replace advice given to you by your health care provider. Make sure you discuss any questions you have with your health care provider. Document Released: 10/07/2016 Document Revised: 09/25/2017 Document Reviewed: 10/07/2016 Elsevier Patient Education  2020 [employer]  Living With Depression Everyone experiences occasional disappointment, sadness, and loss in their lives. When you are feeling down, blue, or sad for at least 2 weeks in a row, it may mean that you have depression. Depression can affect your thoughts and feelings, relationships, daily activities, and physical health. It is caused by changes in the way your brain functions. If you receive a diagnosis of depression, your health care provider will tell you which type of depression you have and what treatment options are available to you. If you are living with depression, there are ways to help you recover from it and also ways to prevent it from coming   back. How to cope with lifestyle changes Coping with stress     Stress is your body's reaction to life changes and events, both good and bad. Stressful situations may include:  Getting married.  The death of a spouse.  Losing a job.  Retiring.  Having a baby. Stress can last just a few hours or it can be ongoing. Stress can play a major role in depression, so it is important to learn both how to cope with stress and how to think about it differently. Talk with your health care provider or a counselor if you would like to learn more about stress reduction. He or  she may suggest some stress reduction techniques, such as:  Music therapy. This can include creating music or listening to music. Choose music that you enjoy and that inspires you.  Mindfulness-based meditation. This kind of meditation can be done while sitting or walking. It involves being aware of your normal breaths, rather than trying to control your breathing.  Centering prayer. This is a kind of meditation that involves focusing on a spiritual word or phrase. Choose a word, phrase, or sacred image that is meaningful to you and that brings you peace.  Deep breathing. To do this, expand your stomach and inhale slowly through your nose. Hold your breath for 3-5 seconds, then exhale slowly, allowing your stomach muscles to relax.  Muscle relaxation. This involves intentionally tensing muscles then relaxing them. Choose a stress reduction technique that fits your lifestyle and personality. Stress reduction techniques take time and practice to develop. Set aside 5-15 minutes a day to do them. Therapists can offer training in these techniques. The training may be covered by some insurance plans. Other things you can do to manage stress include:  Keeping a stress diary. This can help you learn what triggers your stress and ways to control your response.  Understanding what your limits are and saying no to requests or events that lead to a schedule that is too full.  Thinking about how you respond to certain situations. You may not be able to control everything, but you can control how you react.  Adding humor to your life by watching funny films or TV shows.  Making time for activities that help you relax and not feeling guilty about spending your time this way.  Medicines Your health care provider may suggest certain medicines if he or she feels that they will help improve your condition. Avoid using alcohol and other substances that may prevent your medicines from working properly (may  interact). It is also important to:  Talk with your pharmacist or health care provider about all the medicines that you take, their possible side effects, and what medicines are safe to take together.  Make it your goal to take part in all treatment decisions (shared decision-making). This includes giving input on the side effects of medicines. It is best if shared decision-making with your health care provider is part of your total treatment plan. If your health care provider prescribes a medicine, you may not notice the full benefits of it for 4-8 weeks. Most people who are treated for depression need to be on medicine for at least 6-12 months after they feel better. If you are taking medicines as part of your treatment, do not stop taking medicines without first talking to your health care provider. You may need to have the medicine slowly decreased (tapered) over time to decrease the risk of harmful side effects. Relationships Your health   care provider may suggest family therapy along with individual therapy and drug therapy. While there may not be family problems that are causing you to feel depressed, it is still important to make sure your family learns as much as they can about your mental health. Having your family's support can help make your treatment successful. How to recognize changes in your condition Everyone has a different response to treatment for depression. Recovery from major depression happens when you have not had signs of major depression for two months. This may mean that you will start to:  Have more interest in doing activities.  Feel less hopeless than you did 2 months ago.  Have more energy.  Overeat less often, or have better or improving appetite.  Have better concentration. Your health care provider will work with you to decide the next steps in your recovery. It is also important to recognize when your condition is getting worse. Watch for these signs:  Having  fatigue or low energy.  Eating too much or too little.  Sleeping too much or too little.  Feeling restless, agitated, or hopeless.  Having trouble concentrating or making decisions.  Having unexplained physical complaints.  Feeling irritable, angry, or aggressive. Get help as soon as you or your family members notice these symptoms coming back. How to get support and help from others How to talk with friends and family members about your condition  Talking to friends and family members about your condition can provide you with one way to get support and guidance. Reach out to trusted friends or family members, explain your symptoms to them, and let them know that you are working with a health care provider to treat your depression. Financial resources Not all insurance plans cover mental health care, so it is important to check with your insurance carrier. If paying for co-pays or counseling services is a problem, search for a local or county mental health care center. They may be able to offer public mental health care services at low or no cost when you are not able to see a private health care provider. If you are taking medicine for depression, you may be able to get the generic form, which may be less expensive. Some makers of prescription medicines also offer help to patients who cannot afford the medicines they need. Follow these instructions at home:   Get the right amount and quality of sleep.  Cut down on using caffeine, tobacco, alcohol, and other potentially harmful substances.  Try to exercise, such as walking or lifting small weights.  Take over-the-counter and prescription medicines only as told by your health care provider.  Eat a healthy diet that includes plenty of vegetables, fruits, whole grains, low-fat dairy products, and lean protein. Do not eat a lot of foods that are high in solid fats, added sugars, or salt.  Keep all follow-up visits as told by your health  care provider. This is important. Contact a health care provider if:  You stop taking your antidepressant medicines, and you have any of these symptoms: ? Nausea. ? Headache. ? Feeling lightheaded. ? Chills and body aches. ? Not being able to sleep (insomnia).  You or your friends and family think your depression is getting worse. Get help right away if:  You have thoughts of hurting yourself or others. If you ever feel like you may hurt yourself or others, or have thoughts about taking your own life, get help right away. You can go to your nearest   emergency department or call:  Your local emergency services (911 in the [place]  A suicide crisis helpline, such as the National Suicide Prevention Lifeline at 1-800-273-8255. This is open 24-hours a day. Summary  If you are living with depression, there are ways to help you recover from it and also ways to prevent it from coming back.  Work with your health care team to create a management plan that includes counseling, stress management techniques, and healthy lifestyle habits. This information is not intended to replace advice given to you by your health care provider. Make sure you discuss any questions you have with your health care provider. Document Released: 09/15/2016 Document Revised: 02/04/2019 Document Reviewed: 09/15/2016 Elsevier Patient Education  2020 [employer] Inc[employer]  

## 2019-08-18 NOTE — Progress Notes (Signed)
Subjective:    Patient ID: Brianna Barr, female    DOB: [birth year], 23 y.o.   MRN: [id number]  No chief complaint on file.   HPI Patient was seen today for f/u on anxiety and depression.  Viibryd restarted at last [facility].  Pt states she took it for a while, then d/c'd it 2/2 GI upset- N/V, diarrhea x 2 wks.  Pt states her depression is about the same, but her anxiety has increased. Endorses depressed mood, decreased energy.  Denies Si/HI. In the past pt was on Zoloft and Lexapro.  Past Medical History:  Diagnosis Date  . Anxiety   . Asthma   . Depression     No Active Allergies  ROS General: Denies fever, chills, night sweats, changes in weight, changes in appetite HEENT: Denies headaches, ear pain, changes in vision, rhinorrhea, sore throat CV: Denies CP, palpitations, SOB, orthopnea Pulm: Denies SOB, cough, wheezing GI: Denies abdominal pain, nausea, vomiting, diarrhea, constipation GU: Denies dysuria, hematuria, frequency, vaginal discharge Msk: Denies muscle cramps, joint pains Neuro: Denies weakness, numbness, tingling Skin: Denies rashes, bruising Psych: Denies hallucinations  +depression and anxiety    Objective:    Blood pressure 98/78, pulse 92, temperature 97.7 F (36.5 C), temperature source Temporal, weight 232 lb (105.2 kg), SpO2 99 %.  Gen. Pleasant, well-nourished, in no distress, normal affect HEENT: [place]/AT, face symmetric, conjunctiva clear, no scleral icterus, PERRLA, EOMI, nares patent without drainage Lungs: no accessory muscle use, CTAB, no wheezes or rales Cardiovascular: RRR, no m/r/g, no peripheral edema Neuro:  A&Ox3, CN II-XII intact, normal gait  Wt Readings from Last 3 Encounters:  07/14/19 234 lb 11.2 oz (106.5 kg)  07/07/19 234 lb (106.1 kg)    No results found for: WBC, HGB, HCT, PLT, GLUCOSE, CHOL, TRIG, HDL, LDLDIRECT, LDLCALC, ALT, AST, NA, K, CL, CREATININE, BUN, CO2, TSH, PSA, INR, GLUF, HGBA1C,  MICROALBUR  Assessment/Plan:  Anxiety and depression  -PHQ9 score 11 was 11 on 07/07/19 -GAD 7 score 19 was 14 on 07/07/19 -Pt strongly advised to f/u with [facility].  Given a list of area providers. -will try to restart viibryd at 10 mg.   Pt did not do well with increasing to 20 mg - Plan: Vilazodone HCl (VIIBRYD) 10 MG TABS, CBC (no diff), TSH, T4, Free, Vitamin D, 25-hydroxy  History of anemia  -no longer with heavy menses 2/2 IUD in place x 1 yr. - Plan: CBC (no diff)  Class 3 severe obesity without serious comorbidity with body mass index (BMI) of 40.0 to 44.9 in adult, unspecified obesity type ([place])  -discussed lifestyle modifications -pt encouraged to move more - Plan: Hemoglobin V9D, Basic metabolic panel   F/u 6 wks, sooner if needed  Grier Mitts, MD

## 2019-08-19 ENCOUNTER — Other Ambulatory Visit: Payer: Self-pay | Admitting: Family Medicine

## 2019-08-19 DIAGNOSIS — E559 Vitamin D deficiency, unspecified: Secondary | ICD-10-CM

## 2019-08-19 MED ORDER — VITAMIN D (ERGOCALCIFEROL) 1.25 MG (50000 UNIT) PO CAPS: 50000.0000 [IU] | ORAL_CAPSULE | ORAL | 0 refills | Status: DC

## 2019-08-22 ENCOUNTER — Encounter: Payer: Self-pay | Admitting: Physical Therapy

## 2019-08-22 ENCOUNTER — Other Ambulatory Visit: Payer: Self-pay

## 2019-08-22 ENCOUNTER — Ambulatory Visit: Payer: Commercial Managed Care - PPO | Admitting: Physical Therapy

## 2019-08-22 DIAGNOSIS — R252 Cramp and spasm: Secondary | ICD-10-CM

## 2019-08-22 DIAGNOSIS — N942 Vaginismus: Secondary | ICD-10-CM

## 2019-08-22 DIAGNOSIS — N3941 Urge incontinence: Secondary | ICD-10-CM

## 2019-08-22 DIAGNOSIS — R279 Unspecified lack of coordination: Secondary | ICD-10-CM

## 2019-08-22 NOTE — Therapy (Signed)
[facility] [address], [address] [place], [place], [address] Phone: [phone]   Fax:  [phone]  Physical Therapy Treatment  Patient Details  Name: Brianna Barr MRN: [id number] Date of Birth: Apr 21, 1996 Referring Provider (PT): Dr. Steward DroneVeronica Rogers   Encounter Date: 08/22/2019  PT End of Session - 08/22/19 0842    Visit Number  5    Date for PT Re-Evaluation  10/17/19    Authorization Type  [facility]    PT Start Time  0800    PT Stop Time  0841    PT Time Calculation (min)  41 min    Activity Tolerance  Patient tolerated treatment well;No increased pain    Behavior During Therapy  WFL for tasks assessed/performed       Past Medical History:  Diagnosis Date  . Anxiety   . Asthma   . Depression     Past Surgical History:  Procedure Laterality Date  . WISDOM TOOTH EXTRACTION      There were no vitals filed for this visit.  Subjective Assessment - 08/22/19 0802    Subjective  I went to the doctor and found out I have a severe vitamin D deficiency.    Patient Stated Goals  relax the pelvic floor and penile penetration, use a tampon, urge    Currently in Pain?  Yes    Pain Score  1     Pain Location  Vagina    Pain Orientation  Mid    Pain Descriptors / Indicators  Burning;Sharp;Throbbing    Pain Type  Acute pain    Pain Onset  More than a month ago    Pain Frequency  Intermittent    Aggravating Factors   penile penetration, vaginal exam, wearing a tampon    Pain Relieving Factors  no penetration of the vagina    Pain Score  5    Pain Location  Back    Pain Orientation  Right;Left;Lower    Pain Descriptors / Indicators  Aching;Sharp;Sore    Pain Type  Chronic pain    Pain Onset  More than a month ago    Pain Frequency  Intermittent    Aggravating Factors   sitting, lifting    Pain Relieving Factors  ice, heat                    Pelvic Floor Special Questions - 08/22/19 0001    Pelvic  Floor Internal Exam  Patient confirms identification and approves PT  to assess pelvic floor and treatment    Exam Type  Vaginal        [facility] Adult PT Treatment/Exercise - 08/22/19 0001      Self-Care   Self-Care  Other Self-Care Comments    Other Self-Care Comments   education on using the dialtor to perform circular massage on the labia minora      Lumbar Exercises: Stretches   Passive Hamstring Stretch  Right;Left;1 rep;30 seconds    Passive Hamstring Stretch Limitations  using a strap, pull across body and out to the side    Quadruped Mid Back Stretch  3 reps;30 seconds   all 3 directions     Lumbar Exercises: Quadruped   Madcat/Old Horse  15 reps    Other Quadruped Lumbar Exercises  rocking 15x      Manual Therapy   Manual Therapy  Soft tissue mobilization;Myofascial release    Soft tissue mobilization  bilateral hip adductors and quads  Myofascial Release  alnog inner thigh bilaterally    Internal Pelvic Floor  along the introitus with release fo the fascia, monitoring pain no more than 3/10 pain       Trigger Point Dry Needling - 08/22/19 0001    Consent Given?  Yes    Education Handout Provided  Previously provided    Muscles Treated Lower Quadrant  Adductor longus/brevis/magnus;Quadriceps    Quadriceps Response  Twitch response elicited;Palpable increased muscle length    Adductor Response  Twitch response elicited;Palpable increased muscle length           PT Education - 08/22/19 0840    Education Details  using the dilator to perform circular massage around the introitus    Person(s) Educated  Patient    Methods  Explanation    Comprehension  Verbalized understanding;Returned demonstration       PT Short Term Goals - 08/22/19 0845      PT SHORT TERM GOAL #4   Title  understand vaginal health and how to restore ph balance of the vagina    Time  4    Period  Weeks    Status  Achieved    Target Date  08/22/19        PT Long Term Goals - 07/25/19  1330      PT LONG TERM GOAL #1   Title  independent with advanced HEP    Time  12    Period  Weeks    Status  New    Target Date  10/17/19      PT LONG TERM GOAL #2   Title  able to have penile penetration with Marinoff scalr 1/3 due to improve tissue elongation    Time  12    Period  Weeks    Status  New    Target Date  10/17/19      PT LONG TERM GOAL #3   Title  able to fully empty her bladder due to relaxation of the pelvic floor    Time  12    Period  Weeks    Status  New    Target Date  10/17/19      PT LONG TERM GOAL #4   Title  urinary leakage decreased >/= 80% due to decreased over active bladder activity and understands ways to calm the bladder down    Time  12    Period  Weeks    Status  New    Target Date  10/17/19      PT LONG TERM GOAL #5   Title  not having to wear a pad due to reduction of urinary leakage    Time  12    Period  Weeks    Status  New    Target Date  10/17/19            Plan - 08/22/19 8786    Clinical Impression Statement  Patient is now going to use the dilator on the outside of the introitus to have the tissue relax. Patient has increased mobility of bilateral hip adductors. Patient was able to tolerate the therapist performins fascial release around the labia minora and majora. Patient vitamin D level is low so that can contribute to her muscle achiness. Patient will benefit from skilled therapy to improve tension around the pelvic floor and increase the comfort level with therapist working around the perineal area.    Personal Factors and Comorbidities  Sex;Profession    Examination-Activity Limitations  Continence;Toileting    Examination-Participation Restrictions  Interpersonal Relationship    Stability/Clinical Decision Making  Evolving/Moderate complexity    Rehab Potential  Excellent    PT Frequency  1x / week    PT Duration  12 weeks    PT Treatment/Interventions  Biofeedback;Cryotherapy;Electrical Stimulation;Moist  Heat;Ultrasound;Therapeutic exercise;Therapeutic activities;Neuromuscular re-education;Patient/family education;Dry needling;Manual techniques    PT Next Visit Plan  diaphragmatic breathing,  release around the vaginal perineum    PT Home Exercise Plan  Access Code: [email or web address]    Consulted and Agree with Plan of Care  Patient       Patient will benefit from skilled therapeutic intervention in order to improve the following deficits and impairments:  Decreased coordination, Decreased range of motion, Increased fascial restricitons, Increased muscle spasms, Decreased activity tolerance, Pain, Impaired flexibility, Decreased strength, Decreased mobility  Visit Diagnosis: Cramp and spasm  Urge incontinence of urine  Vaginismus  Unspecified lack of coordination     Problem List Patient Active Problem List   Diagnosis Date Noted  . Anxiety and depression 07/07/2019  . Mild intermittent asthma without complication 07/07/2019  . Seasonal allergies 07/07/2019  . Plantar fasciitis 07/07/2019  . Hyperhidrosis 07/07/2019  . OAB (overactive bladder) 07/07/2019  . Allergy 08/06/2018    Eulis Foster, PT 08/22/19 8:46 AM   [facility] [address], [address] [place], [place], [address] Phone: [phone]   Fax:  [phone]  Name: Brianna Barr MRN: [id number] Date of Birth: 1995/11/17

## 2019-08-29 ENCOUNTER — Ambulatory Visit: Payer: Commercial Managed Care - PPO | Attending: Certified Nurse Midwife | Admitting: Physical Therapy

## 2019-08-29 ENCOUNTER — Encounter: Payer: Self-pay | Admitting: Physical Therapy

## 2019-08-29 ENCOUNTER — Other Ambulatory Visit: Payer: Self-pay

## 2019-08-29 DIAGNOSIS — R279 Unspecified lack of coordination: Secondary | ICD-10-CM | POA: Diagnosis present

## 2019-08-29 DIAGNOSIS — N942 Vaginismus: Secondary | ICD-10-CM

## 2019-08-29 DIAGNOSIS — N3941 Urge incontinence: Secondary | ICD-10-CM | POA: Diagnosis present

## 2019-08-29 DIAGNOSIS — R252 Cramp and spasm: Secondary | ICD-10-CM

## 2019-08-29 NOTE — Therapy (Signed)
[facility] [address] [place], [place], [address] Phone: [phone]   Fax:  [phone]  Physical Therapy Treatment  Patient Details  Name: Brianna Barr MRN: [id number] Date of Birth: 1996/06/08 Referring Provider (PT): Dr. Steward Drone   Encounter Date: 08/29/2019  PT End of Session - 08/29/19 0807    Visit Number  6    Date for PT Re-Evaluation  10/17/19    Authorization Type  [facility]R    PT Start Time  0800    PT Stop Time  0840    PT Time Calculation (min)  40 min    Activity Tolerance  Patient tolerated treatment well;No increased pain    Behavior During Therapy  WFL for tasks assessed/performed       Past Medical History:  Diagnosis Date  . Anxiety   . Asthma   . Depression     Past Surgical History:  Procedure Laterality Date  . WISDOM TOOTH EXTRACTION      There were no vitals filed for this visit.  Subjective Assessment - 08/29/19 0802    Subjective  I have been using the dilator on the outside of the vagina and pain level is 1-2/10.    Patient Stated Goals  relax the pelvic floor and penile penetration, use a tampon, urge    Currently in Pain?  Yes    Pain Score  2     Pain Location  Vagina    Pain Orientation  Mid    Pain Descriptors / Indicators  Burning;Sharp;Throbbing    Pain Type  Acute pain    Pain Onset  More than a month ago    Pain Frequency  Intermittent    Aggravating Factors   penile penetration, vaginal exam, wearing a tampon    Pain Relieving Factors  no penetration of the vagina    Multiple Pain Sites  Yes    Pain Score  5    Pain Location  Back    Pain Orientation  Right;Left;Lower    Pain Descriptors / Indicators  Aching;Sharp;Sore    Pain Type  Chronic pain    Pain Onset  More than a month ago    Pain Frequency  Intermittent    Aggravating Factors   sitting, lifting    Pain Relieving Factors  ice, heat                    Pelvic Floor  Special Questions - 08/29/19 0001    Pelvic Floor Internal Exam  Patient confirms identification and approves PT  to assess pelvic floor and treatment    Exam Type  Vaginal        [facility] Adult PT Treatment/Exercise - 08/29/19 0001      Self-Care   Self-Care  Other Self-Care Comments    Other Self-Care Comments   how to use balls to massage her feet to relax the pelvic floor, using the prickly roll to inner thighs and quads and back to relax the muscles      Neuro Re-ed    Neuro Re-ed Details   diaphragmatic breathing with raisng the abdomen instead of the chest to elongate the pelvic floor, , then touching the perineum to feel it bulge while breathing inward, educated patient to do this prior to using the dialator around the perineum,       Lumbar Exercises: Stretches   Single Knee to Chest Stretch  Right;Left;2 reps;20 seconds    Double Knee  to Chest Stretch  2 reps;20 seconds    Other Lumbar Stretch Exercise  butterfly stretch       Manual Therapy   Manual Therapy  Myofascial release;Soft tissue mobilization    Soft tissue mobilization  bil. hip adductor and quadriceps, along the pelvic rim to massage the ischiocavernosus    Myofascial Release  release along the labia majora, the pernimeal membrane             PT Education - 08/29/19 0841    Education Details  using balls to massage feet, using prickly roller to massage the leg and back muscles, how to massage the outside the pernieal area prior to using the dilators    Person(s) Educated  Patient    Methods  Explanation;Demonstration    Comprehension  Verbalized understanding;Returned demonstration       PT Short Term Goals - 08/22/19 0845      PT SHORT TERM GOAL #4   Title  understand vaginal health and how to restore ph balance of the vagina    Time  4    Period  Weeks    Status  Achieved    Target Date  08/22/19        PT Long Term Goals - 08/29/19 0845      PT LONG TERM GOAL #1   Title  independent with  advanced HEP    Time  12    Period  Weeks    Status  On-going      PT LONG TERM GOAL #2   Title  able to have penile penetration with Marinoff scalr 1/3 due to improve tissue elongation    Time  12    Period  Weeks    Status  On-going      PT LONG TERM GOAL #3   Title  able to fully empty her bladder due to relaxation of the pelvic floor    Time  12    Period  Weeks    Status  On-going      PT LONG TERM GOAL #4   Title  urinary leakage decreased >/= 80% due to decreased over active bladder activity and understands ways to calm the bladder down    Time  12    Period  Weeks    Status  On-going            Plan - 08/29/19 0813    Clinical Impression Statement  No change with urinary leakage yet. Patient is using the dilator on the outside of the vaginal canal with pain level 1-2/10. Patient is not ready to progress due to the pain level. Patient had more tightness on the left side of the ischiocavernosus. Patient inner thighs are softer than the past. Patient was able to perform diaphragmatic breathing with pelvic bulging after several trials. Patient will benefit from skilled therapy to improve tnesion around the pelvic floor and increase the comfort level with therapist working around the perineal area.    Personal Factors and Comorbidities  Sex;Profession    Examination-Activity Limitations  Continence;Toileting    Stability/Clinical Decision Making  Evolving/Moderate complexity    Rehab Potential  Excellent    PT Frequency  1x / week    PT Duration  12 weeks    PT Treatment/Interventions  Biofeedback;Cryotherapy;Electrical Stimulation;Moist Heat;Ultrasound;Therapeutic exercise;Therapeutic activities;Neuromuscular re-education;Patient/family education;Dry needling;Manual techniques    PT Next Visit Plan  release around the vaginal canal, assess her coughing posture and sneeze posture to see why she leaks; check  on emptying her bladder    PT Home Exercise Plan  Access Code:  [email or web address]    Consulted and Agree with Plan of Care  Patient       Patient will benefit from skilled therapeutic intervention in order to improve the following deficits and impairments:  Decreased coordination, Decreased range of motion, Increased fascial restricitons, Increased muscle spasms, Decreased activity tolerance, Pain, Impaired flexibility, Decreased strength, Decreased mobility  Visit Diagnosis: Cramp and spasm  Urge incontinence of urine  Vaginismus  Unspecified lack of coordination     Problem List Patient Active Problem List   Diagnosis Date Noted  . Anxiety and depression 07/07/2019  . Mild intermittent asthma without complication 07/07/2019  . Seasonal allergies 07/07/2019  . Plantar fasciitis 07/07/2019  . Hyperhidrosis 07/07/2019  . OAB (overactive bladder) 07/07/2019  . Allergy 08/06/2018    Eulis Fosterheryl Seymore Brodowski, PT 08/29/19 8:47 AM   [facility] [address], [address] [place], [place], [address] Phone: [phone]   Fax:  [phone]  Name: Brianna Barr MRN: [id number] Date of Birth: August 05, 1996

## 2019-09-05 ENCOUNTER — Other Ambulatory Visit: Payer: Self-pay

## 2019-09-05 ENCOUNTER — Ambulatory Visit: Payer: Commercial Managed Care - PPO | Admitting: Physical Therapy

## 2019-09-05 ENCOUNTER — Encounter: Payer: Self-pay | Admitting: Physical Therapy

## 2019-09-05 DIAGNOSIS — R252 Cramp and spasm: Secondary | ICD-10-CM | POA: Diagnosis not present

## 2019-09-05 DIAGNOSIS — N942 Vaginismus: Secondary | ICD-10-CM

## 2019-09-05 DIAGNOSIS — N3941 Urge incontinence: Secondary | ICD-10-CM

## 2019-09-05 DIAGNOSIS — R279 Unspecified lack of coordination: Secondary | ICD-10-CM

## 2019-09-05 NOTE — Therapy (Signed)
[facility] [address] [place], [place], [address] Phone: [phone]   Fax:  [phone]  Physical Therapy Treatment  Patient Details  Name: Brianna Barr MRN: [id number] Date of Birth: Mar 22, 1996 Referring Provider (PT): Dr. Darrol Poke   Encounter Date: 09/05/2019  PT End of Session - 09/05/19 1009    Visit Number  7    Date for PT Re-Evaluation  10/17/19    Authorization Type  [facility]R    PT Start Time  0800    PT Stop Time  0840    PT Time Calculation (min)  40 min    Activity Tolerance  Patient tolerated treatment well;No increased pain    Behavior During Therapy  WFL for tasks assessed/performed       Past Medical History:  Diagnosis Date  . Anxiety   . Asthma   . Depression     Past Surgical History:  Procedure Laterality Date  . WISDOM TOOTH EXTRACTION      There were no vitals filed for this visit.  Subjective Assessment - 09/05/19 0929    Subjective  I am only able to urinate a little bit at a time. The dilator does not go far. I have placed the smallest dilator into the vaginal canal for 1/2 inch. Urge to void wherer a bathroom is not available and has to get to the bathroom immediately. I will then leak urine.    Patient Stated Goals  relax the pelvic floor and penile penetration, use a tampon, urge    Pain Score  2     Pain Location  Vagina    Pain Orientation  Mid    Pain Descriptors / Indicators  Burning;Sharp;Throbbing    Pain Type  Acute pain    Pain Onset  More than a month ago    Pain Frequency  Intermittent    Aggravating Factors   penile penetration, vaginal exam, wearing a tampon    Pain Relieving Factors  no penetration of the vagina    Multiple Pain Sites  Yes    Pain Location  Back    Pain Orientation  Right;Left    Pain Descriptors / Indicators  Aching;Sharp;Sore    Pain Type  Chronic pain    Pain Onset  More than a month ago    Pain Frequency  Intermittent    Aggravating Factors   sitting, lifting    Pain Relieving Factors  ice, heat                    Pelvic Floor Special Questions - 09/05/19 0001    Pelvic Floor Internal Exam  Patient confirms identification and approves PT  to assess pelvic floor and treatment    Exam Type  Vaginal        [facility] Adult PT Treatment/Exercise - 09/05/19 0001      Self-Care   Self-Care  Other Self-Care Comments    Other Self-Care Comments   edcuation on how to so the urge to void to calm the bladder and not have to run to the bathroom; toileting with diaphragmatic breathing, pelvic tilt,  lean forward to let go of the pelvis and use the shhhh noise.       Lumbar Exercises: Stretches   Passive Hamstring Stretch  Right;Left;1 rep;30 seconds    Single Knee to Chest Stretch  Right;Left;2 reps;20 seconds    Double Knee to Chest Stretch  2 reps;20 seconds    Lower Trunk  Rotation  2 reps;30 seconds   each side supine   Hip Flexor Stretch  Right;Left;1 rep;30 seconds   supine with therapist stretching     Lumbar Exercises: Supine   Pelvic Tilt  10 reps    Pelvic Tilt Limitations  with tactile cues form therapist      Manual Therapy   Manual Therapy  Myofascial release    Myofascial Release  release around the ischicavernosus, bulbocavernosus, release of the inner thight             PT Education - 09/05/19 1009    Education Details  urge to void and ways to fully empty her bladder    Person(s) Educated  Patient    Methods  Explanation;Demonstration;Handout    Comprehension  Returned demonstration;Verbalized understanding       PT Short Term Goals - 08/22/19 0845      PT SHORT TERM GOAL #4   Title  understand vaginal health and how to restore ph balance of the vagina    Time  4    Period  Weeks    Status  Achieved    Target Date  08/22/19        PT Long Term Goals - 09/05/19 1014      PT LONG TERM GOAL #1   Title  independent with advanced HEP    Time  12    Period   Weeks    Status  On-going      PT LONG TERM GOAL #2   Time  12    Period  Weeks    Status  On-going      PT LONG TERM GOAL #3   Title  able to fully empty her bladder due to relaxation of the pelvic floor    Time  12    Period  Weeks    Status  On-going      PT LONG TERM GOAL #4   Title  urinary leakage decreased >/= 80% due to decreased over active bladder activity and understands ways to calm the bladder down    Time  12    Period  Weeks    Status  On-going      PT LONG TERM GOAL #5   Title  not having to wear a pad due to reduction of urinary leakage    Time  12    Period  Weeks    Status  On-going            Plan - 09/05/19 0935    Clinical Impression Statement  Patient has strong urge to urinate still and if does not get to the commode in time will leak urine. Patient is able to place the smallest dilator into the vaginal canal 1/2 inch wiht 1-2/10 pain. Patient understands ways to urinate to assist in pelvic floor relaxation and emptying her bladder. Patient will benefit from skilled therapy to improve relaxation of the pelvic floor muscles and increase the comfort level with therapist working around the perineal area.    Personal Factors and Comorbidities  Sex;Profession    Examination-Activity Limitations  Continence;Toileting    Examination-Participation Restrictions  Interpersonal Relationship    Stability/Clinical Decision Making  Evolving/Moderate complexity    Rehab Potential  Excellent    PT Frequency  1x / week    PT Duration  12 weeks    PT Treatment/Interventions  Biofeedback;Cryotherapy;Electrical Stimulation;Moist Heat;Ultrasound;Therapeutic exercise;Therapeutic activities;Neuromuscular re-education;Patient/family education;Dry needling;Manual techniques    PT Next Visit Plan  release around  the vaginal canal,  check on emptying her bladder with the new techniques, stretch the hip flexors, hamstring and hip adductors, back strengthening    PT Home  Exercise Plan  Access Code: [email or web address]    Consulted and Agree with Plan of Care  Patient       Patient will benefit from skilled therapeutic intervention in order to improve the following deficits and impairments:  Decreased coordination, Decreased range of motion, Increased fascial restricitons, Increased muscle spasms, Decreased activity tolerance, Pain, Impaired flexibility, Decreased strength, Decreased mobility  Visit Diagnosis: Cramp and spasm  Urge incontinence of urine  Vaginismus  Unspecified lack of coordination     Problem List Patient Active Problem List   Diagnosis Date Noted  . Anxiety and depression 07/07/2019  . Mild intermittent asthma without complication 07/07/2019  . Seasonal allergies 07/07/2019  . Plantar fasciitis 07/07/2019  . Hyperhidrosis 07/07/2019  . OAB (overactive bladder) 07/07/2019  . Allergy 08/06/2018    Eulis Foster, PT 09/05/19 10:15 AM   [facility] [address] [place], [place], [address] Phone: [phone]   Fax:  [phone]  Name: Brianna Barr MRN: [id number] Date of Birth: March 27, 1996

## 2019-09-05 NOTE — Patient Instructions (Addendum)
Relaxation Exercises with the Urge to Void   When you experience an urge to void:  FIRST  Stop and stand very still    Sit down if you can    Don't move    You need to stay very still to maintain control  SECOND Squeeze your pelvic floor muscles 5 times, like a quick flick, to keep from leaking  THIRD Relax  Take a deep breath and then let it out  Try to make the urge go away by using relaxation and visualization techniques  FINALLY When you feel the urge go away somewhat, walk normally to the bathroom.   If the urge gets suddenly stronger on the way, you may stop again and relax to regain control.  [facility] [address], [address] [place], [place] [address] Phone # [phone] Fax [phone]  

## 2019-09-12 ENCOUNTER — Other Ambulatory Visit: Payer: Self-pay

## 2019-09-12 ENCOUNTER — Encounter: Payer: Self-pay | Admitting: Physical Therapy

## 2019-09-12 ENCOUNTER — Ambulatory Visit: Payer: Commercial Managed Care - PPO | Admitting: Physical Therapy

## 2019-09-12 DIAGNOSIS — R279 Unspecified lack of coordination: Secondary | ICD-10-CM

## 2019-09-12 DIAGNOSIS — R252 Cramp and spasm: Secondary | ICD-10-CM | POA: Diagnosis not present

## 2019-09-12 DIAGNOSIS — N942 Vaginismus: Secondary | ICD-10-CM

## 2019-09-12 DIAGNOSIS — N3941 Urge incontinence: Secondary | ICD-10-CM

## 2019-09-12 NOTE — Therapy (Signed)
[facility] [address], [place], [place], [address] Phone: [phone]   Fax:  [phone]  Physical Therapy Treatment  Patient Details  Name: Brianna Barr MRN: [id number] Date of Birth: 1996/09/22 Referring Provider (PT): Dr. Darrol Poke   Encounter Date: 09/12/2019  PT End of Session - 09/12/19 0811    Visit Number  8    Date for PT Re-Evaluation  10/17/19    Authorization Type  [facility]    PT Start Time  0800    PT Stop Time  0840    PT Time Calculation (min)  40 min    Activity Tolerance  Patient tolerated treatment well;No increased pain    Behavior During Therapy  WFL for tasks assessed/performed       Past Medical History:  Diagnosis Date  . Anxiety   . Asthma   . Depression     Past Surgical History:  Procedure Laterality Date  . WISDOM TOOTH EXTRACTION      There were no vitals filed for this visit.  Subjective Assessment - 09/12/19 0805    Subjective  Smallest dilator is not an inch in. I am emptying my bladder. When I have the urge to urinate I have to immediately get to the bathroom. Urinary leakage improved by 25%/    Patient Stated Goals  relax the pelvic floor and penile penetration, use a tampon, urge    Currently in Pain?  Yes    Pain Score  2     Pain Location  Vagina    Pain Orientation  Mid    Pain Descriptors / Indicators  Burning;Sharp;Throbbing    Pain Type  Acute pain    Pain Frequency  Intermittent    Aggravating Factors   penile penetration, vaginal exam, wearing a tampon    Pain Relieving Factors  no penetration    Pain Score  5    Pain Location  Back    Pain Orientation  Right;Left    Pain Descriptors / Indicators  Aching;Sharp;Sore    Pain Type  Chronic pain    Pain Onset  More than a month ago    Pain Frequency  Intermittent    Aggravating Factors   sitting, lifting    Pain Relieving Factors  ice, heat                    Pelvic Floor Special  Questions - 09/12/19 0001    Pelvic Floor Internal Exam  Patient confirms identification and approves PT  to assess pelvic floor and treatment    Exam Type  Vaginal        [facility] Adult PT Treatment/Exercise - 09/12/19 0001      Self-Care   Self-Care  Other Self-Care Comments    Other Self-Care Comments   how to make the muscles relax around the dilator with contract relax, position of her body to relax with the dilator      Lumbar Exercises: Stretches   Active Hamstring Stretch  Right;Left;3 reps;30 seconds    Active Hamstring Stretch Limitations  using strap all three directions    Hip Flexor Stretch  Right;Left;1 rep;30 seconds    Hip Flexor Stretch Limitations  prone with strap      Manual Therapy   Manual Therapy  Internal Pelvic Floor;Myofascial release    Myofascial Release  release around the ischicavernosus, bulbocavernosus    Internal Pelvic Floor  placed therapist index finge in 2 inches into the  vaginal canal with releasing the bulbocavernosus, introtus, perineal body with talking patient into performins relaxation exercises             PT Education - 09/12/19 0838    Education Details  ways to relax the pelvic floor as she is using the dilator and    Person(s) Educated  Patient    Methods  Explanation    Comprehension  Verbalized understanding       PT Short Term Goals - 08/22/19 0845      PT SHORT TERM GOAL #4   Title  understand vaginal health and how to restore ph balance of the vagina    Time  4    Period  Weeks    Status  Achieved    Target Date  08/22/19        PT Long Term Goals - 09/12/19 0813      PT LONG TERM GOAL #1   Title  independent with advanced HEP    Time  12    Period  Weeks    Status  On-going      PT LONG TERM GOAL #2   Title  able to have penile penetration with Marinoff scalr 1/3 due to improve tissue elongation    Time  12    Period  Weeks    Status  On-going      PT LONG TERM GOAL #3   Title  able to fully empty her  bladder due to relaxation of the pelvic floor    Time  12    Period  Weeks    Status  Achieved      PT LONG TERM GOAL #4   Title  urinary leakage decreased >/= 80% due to decreased over active bladder activity and understands ways to calm the bladder down    Time  12    Period  Weeks    Status  On-going            Plan - 09/12/19 3536    Clinical Impression Statement  Patient is now able to fully empty her bladder. Urinary leakage has decreased by 25%. Patient is now able to place the dilator in 1 inch. Patient understands how to perform relaaxing techniques whiel using the dilator. Patient was able to have pain no higher than 1-2/10 with therapist perfromins manual work to the vagina canal and allow the therapist to place her index finger into the canal 2 inches for first time. Patient will benefit from skilled therapy to improve relaxation of the pelvic floor and increase the comfort level of the therapist working around the perineal area.    Personal Factors and Comorbidities  Sex;Profession    Examination-Activity Limitations  Continence;Toileting    Examination-Participation Restrictions  Interpersonal Relationship    Stability/Clinical Decision Making  Evolving/Moderate complexity    Rehab Potential  Excellent    PT Frequency  1x / week    PT Duration  12 weeks    PT Treatment/Interventions  Biofeedback;Cryotherapy;Electrical Stimulation;Moist Heat;Ultrasound;Therapeutic exercise;Therapeutic activities;Neuromuscular re-education;Patient/family education;Dry needling;Manual techniques    PT Next Visit Plan  release around the vaginal canal,  stretch the hip flexors, hamstring and hip adductors, back strengthening    PT Home Exercise Plan  Access Code: [email or web address]    Consulted and Agree with Plan of Care  Patient       Patient will benefit from skilled therapeutic intervention in order to improve the following deficits and impairments:  Decreased coordination, Decreased range of  motion,  Increased fascial restricitons, Increased muscle spasms, Decreased activity tolerance, Pain, Impaired flexibility, Decreased strength, Decreased mobility  Visit Diagnosis: Cramp and spasm  Urge incontinence of urine  Vaginismus  Unspecified lack of coordination     Problem List Patient Active Problem List   Diagnosis Date Noted  . Anxiety and depression 07/07/2019  . Mild intermittent asthma without complication 07/07/2019  . Seasonal allergies 07/07/2019  . Plantar fasciitis 07/07/2019  . Hyperhidrosis 07/07/2019  . OAB (overactive bladder) 07/07/2019  . Allergy 08/06/2018    Eulis Fosterheryl Gray, PT 09/12/19 8:43 AM   [facility] [address], [address] [place], [place], [address] Phone: [phone]   Fax:  [phone]  Name: Efraim Kaufmannlana Dionne Luebbe MRN: [id number] Date of Birth: 1996-04-06

## 2019-09-19 ENCOUNTER — Other Ambulatory Visit: Payer: Self-pay

## 2019-09-19 ENCOUNTER — Encounter: Payer: Self-pay | Admitting: Physical Therapy

## 2019-09-19 ENCOUNTER — Ambulatory Visit: Payer: Commercial Managed Care - PPO | Admitting: Physical Therapy

## 2019-09-19 ENCOUNTER — Encounter: Payer: Self-pay | Admitting: Podiatry

## 2019-09-19 ENCOUNTER — Ambulatory Visit: Payer: Commercial Managed Care - PPO | Admitting: Podiatry

## 2019-09-19 DIAGNOSIS — R252 Cramp and spasm: Secondary | ICD-10-CM

## 2019-09-19 DIAGNOSIS — M722 Plantar fascial fibromatosis: Secondary | ICD-10-CM | POA: Diagnosis not present

## 2019-09-19 DIAGNOSIS — R279 Unspecified lack of coordination: Secondary | ICD-10-CM

## 2019-09-19 DIAGNOSIS — N942 Vaginismus: Secondary | ICD-10-CM

## 2019-09-19 DIAGNOSIS — N3941 Urge incontinence: Secondary | ICD-10-CM

## 2019-09-19 NOTE — Therapy (Signed)
[facility] [address], [place], [place], [address] Phone: [phone]   Fax:  [phone]  Physical Therapy Treatment  Patient Details  Name: Brianna Barr MRN: [id number] Date of Birth: 08-29-1996 Referring Provider (PT): Dr. Darrol Poke   Encounter Date: 09/19/2019  PT End of Session - 09/19/19 0808    Visit Number  9    Date for PT Re-Evaluation  10/17/19    Authorization Type  [facility]R    PT Start Time  0800    PT Stop Time  0840    PT Time Calculation (min)  40 min    Activity Tolerance  Patient tolerated treatment well;No increased pain    Behavior During Therapy  WFL for tasks assessed/performed       Past Medical History:  Diagnosis Date  . Anxiety   . Asthma   . Depression     Past Surgical History:  Procedure Laterality Date  . WISDOM TOOTH EXTRACTION      There were no vitals filed for this visit.  Subjective Assessment - 09/19/19 0804    Subjective  I could go a little further with the dilator. The urinary leakage is 35% better. The urge to void happens  50% of the time.    Patient Stated Goals  relax the pelvic floor and penile penetration, use a tampon, urge    Pain Score  3     Pain Location  Vagina    Pain Orientation  Mid    Pain Descriptors / Indicators  Burning;Sharp;Throbbing    Pain Type  Acute pain    Pain Onset  More than a month ago    Pain Frequency  Intermittent    Aggravating Factors   penile penetration, vaginal exam, wearing a tampone    Pain Relieving Factors  no penetration    Multiple Pain Sites  Yes    Pain Score  5    Pain Location  Back    Pain Orientation  Right;Left    Pain Descriptors / Indicators  Aching;Sharp;Sore    Pain Type  Chronic pain    Pain Onset  More than a month ago    Pain Frequency  Intermittent    Aggravating Factors   sitting, lifting    Pain Relieving Factors  ice, heat         [facility] PT Assessment - 09/19/19 0001       Assessment   Medical Diagnosis  N94.2 Vaginismus    Referring Provider (PT)  Dr. Darrol Poke    Onset Date/Surgical Date  --   chronic   Prior Therapy  none; sees chiropractor for back pain      Precautions   Precautions  None                Pelvic Floor Special Questions - 09/19/19 0001    Pelvic Floor Internal Exam  Patient confirms identification and approves PT  to assess pelvic floor and treatment    Exam Type  Vaginal    Palpation  perineal body and bulbocavernosus was softer after dry needling, Left tighter than right        [facility] Adult PT Treatment/Exercise - 09/19/19 0001      Lumbar Exercises: Stretches   Active Hamstring Stretch  Right;Left;3 reps;30 seconds    Active Hamstring Stretch Limitations  using strap all three directions    Hip Flexor Stretch  Right;Left;1 rep;30 seconds    Hip Flexor Stretch Limitations  therapist assisting    Piriformis Stretch  Right;Left;1 rep;60 seconds    Piriformis Stretch Limitations  supine with strap      Manual Therapy   Manual Therapy  Internal Pelvic Floor;Soft tissue mobilization    Soft tissue mobilization  using the Addaday to the gluteals and hamstrings    Internal Pelvic Floor  release of the perineal body and bilateral bulbocavernosus to elongate after the dry needling       Trigger Point Dry Needling - 09/19/19 0001    Consent Given?  Yes    Education Handout Provided  Previously provided    Muscles Treated Back/Hip  Gluteus minimus;Gluteus medius;Gluteus maximus;Piriformis   left   Other Dry Needling  perineal body, superior transverse perineum    Gluteus Minimus Response  Twitch response elicited;Palpable increased muscle length    Gluteus Medius Response  Twitch response elicited;Palpable increased muscle length    Gluteus Maximus Response  Twitch response elicited;Palpable increased muscle length    Piriformis Response  Twitch response elicited;Palpable increased muscle length              PT Short Term Goals - 08/22/19 0845      PT SHORT TERM GOAL #4   Title  understand vaginal health and how to restore ph balance of the vagina    Time  4    Period  Weeks    Status  Achieved    Target Date  08/22/19        PT Long Term Goals - 09/19/19 0844      PT LONG TERM GOAL #1   Title  independent with advanced HEP    Baseline  still learning    Time  12    Period  Weeks    Status  On-going      PT LONG TERM GOAL #2   Title  able to have penile penetration with Marinoff scalr 1/3 due to improve tissue elongation    Baseline  uses the first dilator    Time  12    Period  Weeks    Status  On-going      PT LONG TERM GOAL #3   Title  able to fully empty her bladder due to relaxation of the pelvic floor    Time  12    Period  Weeks    Status  Achieved      PT LONG TERM GOAL #4   Title  urinary leakage decreased >/= 80% due to decreased over active bladder activity and understands ways to calm the bladder down    Baseline  35% better    Time  12    Period  Weeks    Status  On-going      PT LONG TERM GOAL #5   Title  not having to wear a pad due to reduction of urinary leakage    Time  12    Period  Weeks    Status  On-going            Plan - 09/19/19 2542    Clinical Impression Statement  Urinary leakage is 35% better. Patient has the strong urge to void 50% of the time. Patient responded well to the dry needling with the perineal body and bulbocavernosus soften and elongated to help progresss with the vaginal dilator. Patient pain level did not get higher than 2/10 with the dry needling and manual work. Patient will benefit from skilled therapy to improve relaxation of the pelvic floor  and increase the comfort level ofthe therapist working around the perineal area. d    Personal Factors and Comorbidities  Sex;Profession    Examination-Activity Limitations  Continence;Toileting    Examination-Participation Restrictions  Interpersonal  Relationship    Stability/Clinical Decision Making  Evolving/Moderate complexity    Rehab Potential  Excellent    PT Frequency  1x / week    PT Duration  12 weeks    PT Treatment/Interventions  Biofeedback;Cryotherapy;Electrical Stimulation;Moist Heat;Ultrasound;Therapeutic exercise;Therapeutic activities;Neuromuscular re-education;Patient/family education;Dry needling;Manual techniques    PT Next Visit Plan  release around the vaginal canal,  stretch the hip flexors, hamstring and hip adductors, back strengthening    PT Home Exercise Plan  Access Code: [email or web address]    Consulted and Agree with Plan of Care  Patient       Patient will benefit from skilled therapeutic intervention in order to improve the following deficits and impairments:  Decreased coordination, Decreased range of motion, Increased fascial restricitons, Increased muscle spasms, Decreased activity tolerance, Pain, Impaired flexibility, Decreased strength, Decreased mobility  Visit Diagnosis: Cramp and spasm  Unspecified lack of coordination  Urge incontinence of urine  Vaginismus     Problem List Patient Active Problem List   Diagnosis Date Noted  . Anxiety and depression 07/07/2019  . Mild intermittent asthma without complication 07/07/2019  . Seasonal allergies 07/07/2019  . Plantar fasciitis 07/07/2019  . Hyperhidrosis 07/07/2019  . OAB (overactive bladder) 07/07/2019  . Allergy 08/06/2018    Eulis Fosterheryl Gray, PT 09/19/19 8:46 AM   [facility] [address], [address] [place], [place], [address] Phone: [phone]   Fax:  [phone]  Name: Brianna Barr MRN: [id number] Date of Birth: 1996/05/02

## 2019-09-20 NOTE — Progress Notes (Signed)
Subjective:   Patient ID: Brianna Barr, female   DOB: 23 y.o.   MRN: [id number]   HPI Patient presents with intense discomfort plantar aspect heel region bilateral stated she had relief for about 6 weeks with reoccurrence and feels like she needs support    ROS      Objective:  Physical Exam  Neurovascular status intact with patient found to have exquisite discomfort plantar aspect left heel at the insertional point tendon calcaneus inflammation fluid buildup with flatfoot structure bilateral with patient works on cement floors     Assessment:  Acute plantar fasciitis left with inflammation along with moderate flatfoot deformity     Plan:  H&P I went ahead today did sterile prep and injected the fascia 3 mg Kenalog 5 mg Xylocaine and I then went ahead and casted for functional orthotic devices bilateral

## 2019-09-21 ENCOUNTER — Encounter

## 2019-09-26 ENCOUNTER — Ambulatory Visit: Payer: Commercial Managed Care - PPO | Admitting: Physical Therapy

## 2019-09-26 ENCOUNTER — Encounter: Payer: Self-pay | Admitting: Physical Therapy

## 2019-09-26 ENCOUNTER — Other Ambulatory Visit: Payer: Self-pay

## 2019-09-26 DIAGNOSIS — R279 Unspecified lack of coordination: Secondary | ICD-10-CM

## 2019-09-26 DIAGNOSIS — R252 Cramp and spasm: Secondary | ICD-10-CM | POA: Diagnosis not present

## 2019-09-26 DIAGNOSIS — N3941 Urge incontinence: Secondary | ICD-10-CM

## 2019-09-26 DIAGNOSIS — N942 Vaginismus: Secondary | ICD-10-CM

## 2019-09-26 NOTE — Therapy (Signed)
[facility] [address] [place], [place], [address] Phone: [phone]   Fax:  [phone]  Physical Therapy Treatment  Patient Details  Name: Brianna Barr MRN: [id number] Date of Birth: [birth year] Referring Provider (PT): Dr. Steward Drone   Encounter Date: 09/26/2019  PT End of Session - 09/26/19 0803    Visit Number  10    Date for PT Re-Evaluation  10/17/19    Authorization Type  [facility]R    PT Start Time  0803    PT Stop Time  0841    PT Time Calculation (min)  38 min    Activity Tolerance  Patient tolerated treatment well;No increased pain    Behavior During Therapy  WFL for tasks assessed/performed       Past Medical History:  Diagnosis Date  . Anxiety   . Asthma   . Depression     Past Surgical History:  Procedure Laterality Date  . WISDOM TOOTH EXTRACTION      There were no vitals filed for this visit.  Subjective Assessment - 09/26/19 0805    Subjective  I got the steriod shots in my feet. The first week was painful and today my feet and back feel better. NOw changes with urinary leakage or urge to vod since last visit. I was only able to use the dilator 3-4 times last due to working alot.No buring but have soreness and achiness.    Patient Stated Goals  relax the pelvic floor and penile penetration, use a tampon, urge    Currently in Pain?  Yes    Pain Score  2     Pain Location  Vagina    Pain Orientation  Mid    Pain Descriptors / Indicators  Sore;Aching    Pain Type  Acute pain    Pain Onset  More than a month ago    Pain Frequency  Intermittent    Aggravating Factors   penile penetration, vaginal exam, wearing a tampon, using the vaginal dilator    Pain Relieving Factors  no penetration    Multiple Pain Sites  Yes    Pain Score  4    Pain Location  Back    Pain Orientation  Lower    Pain Descriptors / Indicators  Aching;Sharp;Sore    Pain Type  Chronic pain    Pain Onset   More than a month ago    Pain Frequency  Intermittent    Aggravating Factors   sitting, lifting    Pain Relieving Factors  ice, heat         [facility] PT Assessment - 09/26/19 0001      PROM   Right Hip External Rotation   65    Left Hip External Rotation   60                Pelvic Floor Special Questions - 09/26/19 0001    Pelvic Floor Internal Exam  Patient confirms identification and approves PT  to assess pelvic floor and treatment    Exam Type  Vaginal    Strength  Flicker        [facility] Adult PT Treatment/Exercise - 09/26/19 0001      Lumbar Exercises: Stretches   Active Hamstring Stretch  Right;Left;3 reps;30 seconds    Active Hamstring Stretch Limitations  using strap all three directions    Piriformis Stretch  Right;Left;1 rep;60 seconds    Piriformis Stretch Limitations  supine with strap  Manual Therapy   Manual Therapy  Joint mobilization;Internal Pelvic Floor    Joint Mobilization  using the mulligan belt for joiint mobilization to bil. hips for distraction, lateral and infertior glide to increase hip ER    Internal Pelvic Floor  release of the perineal bodt, ischiocavernosus, bulbocavernosus, and superior transverse       Trigger Point Dry Needling - 09/26/19 0001    Consent Given?  Yes    Education Handout Provided  Previously provided    Other Dry Needling  perineal body, superior transverse perineum,ischiocavernosus, bulbocavernosus   twitch response, elongation of muscles            PT Short Term Goals - 08/22/19 0845      PT SHORT TERM GOAL #4   Title  understand vaginal health and how to restore ph balance of the vagina    Time  4    Period  Weeks    Status  Achieved    Target Date  08/22/19        PT Long Term Goals - 09/26/19 0843      PT LONG TERM GOAL #1   Title  independent with advanced HEP    Baseline  still learning    Time  12    Period  Weeks    Status  On-going      PT LONG TERM GOAL #2   Title  able to  have penile penetration with Marinoff scalr 1/3 due to improve tissue elongation    Baseline  uses the first dilator    Time  12    Period  Weeks    Status  On-going      PT LONG TERM GOAL #3   Title  able to fully empty her bladder due to relaxation of the pelvic floor    Time  12    Period  Weeks    Status  Achieved      PT LONG TERM GOAL #4   Title  urinary leakage decreased >/= 80% due to decreased over active bladder activity and understands ways to calm the bladder down    Baseline  35% better    Period  Weeks    Status  On-going      PT LONG TERM GOAL #5   Title  not having to wear a pad due to reduction of urinary leakage    Time  12    Period  Weeks    Status  On-going            Plan - 09/26/19 0841    Clinical Impression Statement  Patient was able to use the dilator with greater ease after the dry needling to the pelvic floor. Patient pelvic floor strength is 1/5 due to muscles being tight. Patient pelvic floor muscles were able to be elongated with soft tissue work with greater ease. Patient had increased in hip rotation after manual mobilization. Patient will benefit from skilled therapy to improve relaxation of the pelvic floor and increase the comfort leel of the therapist working around the perineal body.    Personal Factors and Comorbidities  Sex;Profession    Examination-Activity Limitations  Continence;Toileting    Stability/Clinical Decision Making  Evolving/Moderate complexity    Rehab Potential  Excellent    PT Frequency  1x / week    PT Duration  12 weeks    PT Treatment/Interventions  Biofeedback;Cryotherapy;Electrical Stimulation;Moist Heat;Ultrasound;Therapeutic exercise;Therapeutic activities;Neuromuscular re-education;Patient/family education;Dry needling;Manual techniques    PT Next Visit Plan  release around the vaginal canal,  stretch the hip flexors, hamstring and hip adductors, back strengthening    PT Home Exercise Plan  Access Code:  [email or web address]    Consulted and Agree with Plan of Care  Patient       Patient will benefit from skilled therapeutic intervention in order to improve the following deficits and impairments:  Decreased coordination, Decreased range of motion, Increased fascial restricitons, Increased muscle spasms, Decreased activity tolerance, Pain, Impaired flexibility, Decreased strength, Decreased mobility  Visit Diagnosis: Cramp and spasm  Unspecified lack of coordination  Urge incontinence of urine  Vaginismus     Problem List Patient Active Problem List   Diagnosis Date Noted  . Anxiety and depression 07/07/2019  . Mild intermittent asthma without complication [id number]/9628  . Seasonal allergies 07/07/2019  . Plantar fasciitis 07/07/2019  . Hyperhidrosis 07/07/2019  . OAB (overactive bladder) 07/07/2019  . Allergy 08/06/2018    Earlie Counts, PT 09/26/19 8:44 AM   [facility] [address] [place], [place], [address] Phone: [phone]   Fax:  [phone]  Name: Brianna Barr MRN: [id number] Date of Birth: 06/21/1996

## 2019-09-29 ENCOUNTER — Encounter: Payer: Self-pay | Admitting: Family Medicine

## 2019-09-29 ENCOUNTER — Ambulatory Visit (INDEPENDENT_AMBULATORY_CARE_PROVIDER_SITE_OTHER): Payer: Commercial Managed Care - PPO | Admitting: Family Medicine

## 2019-09-29 ENCOUNTER — Other Ambulatory Visit: Payer: Self-pay

## 2019-09-29 VITALS — BP 98/78 | HR 82 | Temp 97.7°F | Wt 232.0 lb

## 2019-09-29 DIAGNOSIS — F32A Depression, unspecified: Secondary | ICD-10-CM

## 2019-09-29 DIAGNOSIS — F329 Major depressive disorder, single episode, unspecified: Secondary | ICD-10-CM

## 2019-09-29 DIAGNOSIS — F419 Anxiety disorder, unspecified: Secondary | ICD-10-CM

## 2019-09-29 DIAGNOSIS — E559 Vitamin D deficiency, unspecified: Secondary | ICD-10-CM

## 2019-09-29 MED ORDER — VILAZODONE HCL 10 MG PO TABS: 5.0000 mg | ORAL_TABLET | Freq: Every day | ORAL | 0 refills | Status: DC

## 2019-09-29 MED ORDER — BUPROPION HCL ER (XL) 150 MG PO TB24: 150.0000 mg | ORAL_TABLET | Freq: Every day | ORAL | 3 refills | Status: DC

## 2019-09-29 NOTE — Patient Instructions (Addendum)
You can start decreasing your dose of viibryd to 5 mg daily x 1 wk.  After this you can start Wellbutrin XR 150 mg daily.  If needed we can increase the dose of the Wellbutrin in a week as compared to 4-6 weeks.  Make a goal to do somehing for yourself at least once a week.  Consider journaling and keeping a notepad next to your bed.   Vitamin D Deficiency Vitamin D deficiency is when your body does not have enough vitamin D. Vitamin D is important to your body for many reasons:  It helps the body absorb two important minerals-calcium and phosphorus.  It plays a role in bone health.  It may help to prevent some diseases, such as diabetes and multiple sclerosis.  It plays a role in muscle function, including heart function. If vitamin D deficiency is severe, it can cause a condition in which your bones become soft. In adults, this condition is called osteomalacia. In children, this condition is called rickets. What are the causes? This condition may be caused by:  Not eating enough foods that contain vitamin D.  Not getting enough natural sun exposure.  Having certain digestive system diseases that make it difficult for your body to absorb vitamin D. These diseases include Crohn's disease, chronic pancreatitis, and cystic fibrosis.  Having a surgery in which a part of the stomach or a part of the small intestine is removed.  Having chronic kidney disease or liver disease. What increases the risk? You are more likely to develop this condition if you:  Are older.  Do not spend much time outdoors.  Live in a long-term care facility.  Have had broken bones.  Have weak or thin bones (osteoporosis).  Have a disease or condition that changes how the body absorbs vitamin D.  Have dark skin.  Take certain medicines, such as steroid medicines or certain seizure medicines.  Are overweight or obese. What are the signs or symptoms? In mild cases of vitamin D deficiency, there may not  be any symptoms. If the condition is severe, symptoms may include:  Bone pain.  Muscle pain.  Falling often.  Broken bones caused by a minor injury. How is this diagnosed? This condition may be diagnosed with blood tests. Imaging tests such as X-rays may also be done to look for changes in the bone. How is this treated? Treatment for this condition may depend on what caused the condition. Treatment options include:  Taking vitamin D supplements. Your health care provider will suggest what dose is best for you.  Taking a calcium supplement. Your health care provider will suggest what dose is best for you. Follow these instructions at home: Eating and drinking   Eat foods that contain vitamin D. Choices include: ? Fortified dairy products, cereals, or juices. Fortified means that vitamin D has been added to the food. Check the label on the package to see if the food is fortified. ? Fatty fish, such as salmon or trout. ? Eggs. ? Oysters. ? Mushrooms. The items listed above may not be a complete list of recommended foods and beverages. Contact a dietitian for more information. General instructions  Take medicines and supplements only as told by your health care provider.  Get regular, safe exposure to natural sunlight.  Do not use a tanning bed.  Maintain a healthy weight. Lose weight if needed.  Keep all follow-up visits as told by your health care provider. This is important. How is this prevented? You  can get vitamin D by:  Eating foods that naturally contain vitamin D.  Eating or drinking products that have been fortified with vitamin D, such as cereals, juices, and dairy products (including milk).  Taking a vitamin D supplement or a multivitamin supplement that contains vitamin D.  Being in the sun. Your body naturally makes vitamin D when your skin is exposed to sunlight. Your body changes the sunlight into a form of the vitamin that it can use. Contact a health  care provider if:  Your symptoms do not go away.  You feel nauseous or you vomit.  You have fewer bowel movements than usual or are constipated. Summary  Vitamin D deficiency is when your body does not have enough vitamin D.  Vitamin D is important to your body for good bone health and muscle function, and it may help prevent some diseases.  Vitamin D deficiency is primarily treated through supplementation. Your health care provider will suggest what dose is best for you.  You can get vitamin D by eating foods that contain vitamin D, by being in the sun, and by taking a vitamin D supplement or a multivitamin supplement that contains vitamin D. This information is not intended to replace advice given to you by your health care provider. Make sure you discuss any questions you have with your health care provider. Document Released: 01/05/2012 Document Revised: 06/21/2018 Document Reviewed: 06/21/2018 Elsevier Patient Education  2020 Elsevier Inc.  Living With Anxiety  After being diagnosed with an anxiety disorder, you may be relieved to know why you have felt or behaved a certain way. It is natural to also feel overwhelmed about the treatment ahead and what it will mean for your life. With care and support, you can manage this condition and recover from it. How to cope with anxiety Dealing with stress Stress is your body's reaction to life changes and events, both good and bad. Stress can last just a few hours or it can be ongoing. Stress can play a major role in anxiety, so it is important to learn both how to cope with stress and how to think about it differently. Talk with your health care provider or a counselor to learn more about stress reduction. He or she may suggest some stress reduction techniques, such as:  Music therapy. This can include creating or listening to music that you enjoy and that inspires you.  Mindfulness-based meditation. This involves being aware of your  normal breaths, rather than trying to control your breathing. It can be done while sitting or walking.  Centering prayer. This is a kind of meditation that involves focusing on a word, phrase, or sacred image that is meaningful to you and that brings you peace.  Deep breathing. To do this, expand your stomach and inhale slowly through your nose. Hold your breath for 3-5 seconds. Then exhale slowly, allowing your stomach muscles to relax.  Self-talk. This is a skill where you identify thought patterns that lead to anxiety reactions and correct those thoughts.  Muscle relaxation. This involves tensing muscles then relaxing them. Choose a stress reduction technique that fits your lifestyle and personality. Stress reduction techniques take time and practice. Set aside 5-15 minutes a day to do them. Therapists can offer training in these techniques. The training may be covered by some insurance plans. Other things you can do to manage stress include:  Keeping a stress diary. This can help you learn what triggers your stress and ways to control your  response.  Thinking about how you respond to certain situations. You may not be able to control everything, but you can control your reaction.  Making time for activities that help you relax, and not feeling guilty about spending your time in this way. Therapy combined with coping and stress-reduction skills provides the best chance for successful treatment. Medicines Medicines can help ease symptoms. Medicines for anxiety include:  Anti-anxiety drugs.  Antidepressants.  Beta-blockers. Medicines may be used as the main treatment for anxiety disorder, along with therapy, or if other treatments are not working. Medicines should be prescribed by a health care provider. Relationships Relationships can play a big part in helping you recover. Try to spend more time connecting with trusted friends and family members. Consider going to couples counseling,  taking family education classes, or going to family therapy. Therapy can help you and others better understand the condition. How to recognize changes in your condition Everyone has a different response to treatment for anxiety. Recovery from anxiety happens when symptoms decrease and stop interfering with your daily activities at home or work. This may mean that you will start to:  Have better concentration and focus.  Sleep better.  Be less irritable.  Have more energy.  Have improved memory. It is important to recognize when your condition is getting worse. Contact your health care provider if your symptoms interfere with home or work and you do not feel like your condition is improving. Where to find help and support: You can get help and support from these sources:  Self-help groups.  Online and [employer].  A trusted spiritual leader.  Couples counseling.  Family education classes.  Family therapy. Follow these instructions at home:  Eat a healthy diet that includes plenty of vegetables, fruits, whole grains, low-fat dairy products, and lean protein. Do not eat a lot of foods that are high in solid fats, added sugars, or salt.  Exercise. Most adults should do the following: ? Exercise for at least 150 minutes each week. The exercise should increase your heart rate and make you sweat (moderate-intensity exercise). ? Strengthening exercises at least twice a week.  Cut down on caffeine, tobacco, alcohol, and other potentially harmful substances.  Get the right amount and quality of sleep. Most adults need 7-9 hours of sleep each night.  Make choices that simplify your life.  Take over-the-counter and prescription medicines only as told by your health care provider.  Avoid caffeine, alcohol, and certain over-the-counter cold medicines. These may make you feel worse. Ask your pharmacist which medicines to avoid.  Keep all follow-up visits as told by your  health care provider. This is important. Questions to ask your health care provider  Would I benefit from therapy?  How often should I follow up with a health care provider?  How long do I need to take medicine?  Are there any long-term side effects of my medicine?  Are there any alternatives to taking medicine? Contact a health care provider if:  You have a hard time staying focused or finishing daily tasks.  You spend many hours a day feeling worried about everyday life.  You become exhausted by worry.  You start to have headaches, feel tense, or have nausea.  You urinate more than normal.  You have diarrhea. Get help right away if:  You have a racing heart and shortness of breath.  You have thoughts of hurting yourself or others. If you ever feel like you may hurt yourself or others,  or have thoughts about taking your own life, get help right away. You can go to your nearest emergency department or call:  Your local emergency services (911 in the [place]  A suicide crisis helpline, such as the National Suicide Prevention Lifeline at 423 875 40571-4134022083. This is open 24-hours a day. Summary  Taking steps to deal with stress can help calm you.  Medicines cannot cure anxiety disorders, but they can help ease symptoms.  Family, friends, and partners can play a big part in helping you recover from an anxiety disorder. This information is not intended to replace advice given to you by your health care provider. Make sure you discuss any questions you have with your health care provider. Document Released: 10/07/2016 Document Revised: 09/25/2017 Document Reviewed: 10/07/2016 Elsevier Patient Education  2020 [employer]

## 2019-09-29 NOTE — Progress Notes (Signed)
Subjective:    Patient ID: Brianna Barr, female    DOB: Feb 16, 1996, 23 y.o.   MRN: [id number]  No chief complaint on file.   HPI Patient was seen today for f/u on anxiety and depression.  Pt not sure viibryd helping as much as it could.  Pt breaking viibryd 20 mg tabs in half daily. Endorses feeling depressed, decreased energy, wanting to sleep/stay in on days off.  Pt works nights as a Marine scientist.  Also notes increased anxiety.  Denies SI/HI.  Zoloft caused pt to feel jittery/restless in the past.  Pt was also on lexapro but does not remember what happened with it.    Pt taking Ergocalciferol 50,000 IU wkly.  Notes a slight improvement in energy.  Past Medical History:  Diagnosis Date  . Anxiety   . Asthma   . Depression     No Known Allergies  ROS General: Denies fever, chills, night sweats, changes in weight, changes in appetite HEENT: Denies headaches, ear pain, changes in vision, rhinorrhea, sore throat CV: Denies CP, palpitations, SOB, orthopnea Pulm: Denies SOB, cough, wheezing GI: Denies abdominal pain, nausea, vomiting, diarrhea, constipation GU: Denies dysuria, hematuria, frequency, vaginal discharge Msk: Denies muscle cramps, joint pains Neuro: Denies weakness, numbness, tingling Skin: Denies rashes, bruising Psych: Denies hallucinations  +anxiety and depression    Objective:    Blood pressure 98/78, pulse 82, temperature 97.7 F (36.5 C), temperature source Temporal, weight 232 lb (105.2 kg), SpO2 98 %.   Gen. Pleasant, well-nourished, in no distress, slightly decreased affect   HEENT: /AT, face symmetric, no scleral icterus, PERRLA, nares patent without drainage Lungs: no accessory muscle use Cardiovascular: RRR no peripheral edema Musculoskeletal: No deformities, no cyanosis or clubbing, normal tone Neuro:  A&Ox3, CN II-XII intact, normal gait Skin:  Warm, no lesions/ rash  Wt Readings from Last 3 Encounters:  09/29/19 232 lb (105.2 kg)  08/18/19 232  lb (105.2 kg)  07/14/19 234 lb 11.2 oz (106.5 kg)    Lab Results  Component Value Date   WBC 8.5 08/18/2019   HGB 12.7 08/18/2019   HCT 38.6 08/18/2019   PLT 411.0 (H) 08/18/2019   GLUCOSE 84 08/18/2019   NA 140 08/18/2019   K 4.0 08/18/2019   CL 106 08/18/2019   CREATININE 0.86 08/18/2019   BUN 11 08/18/2019   CO2 24 08/18/2019   TSH 2.38 08/18/2019   HGBA1C 5.4 08/18/2019    Assessment/Plan:  Anxiety and depression  -PHQ 9 score 12 this visit.  Was 11 on 08/18/19 -GAD 7 score 17 this visit.  Was 19 on 08/18/19 -discussed decreasing Viibryd to 5 mg x 1-2 wks.  With the plans of starting Wellbutrin XL 150 mg -discussed ways to reduce stress and decrease anxiety.  Pt to set time aside once/wk to do something for herself. -Discussed counseling and Psychiatry.  Pt to look into area providers. -given handouts - Plan: Vilazodone HCl (VIIBRYD) 10 MG TABS, buPROPion (WELLBUTRIN XL) 150 MG 24 hr tablet -given precautions  Vitamin D deficiency -continue Ergocalciferol 50, 000 IU -will recheck vit D after completion of 12 wk course of Ergocalciferol 50,000 IU  F/u in 4-6 wks  Grier Mitts, MD

## 2019-10-03 ENCOUNTER — Encounter: Payer: Self-pay | Admitting: Physical Therapy

## 2019-10-03 ENCOUNTER — Ambulatory Visit: Payer: Commercial Managed Care - PPO | Attending: Certified Nurse Midwife | Admitting: Physical Therapy

## 2019-10-03 ENCOUNTER — Other Ambulatory Visit: Payer: Self-pay

## 2019-10-03 ENCOUNTER — Encounter: Payer: Self-pay | Admitting: Family Medicine

## 2019-10-03 DIAGNOSIS — R252 Cramp and spasm: Secondary | ICD-10-CM | POA: Diagnosis not present

## 2019-10-03 DIAGNOSIS — R279 Unspecified lack of coordination: Secondary | ICD-10-CM | POA: Diagnosis present

## 2019-10-03 DIAGNOSIS — N3941 Urge incontinence: Secondary | ICD-10-CM

## 2019-10-03 DIAGNOSIS — N942 Vaginismus: Secondary | ICD-10-CM

## 2019-10-03 NOTE — Patient Instructions (Addendum)
Bear Down    Exhaling, bear down as if to have a bowel movement. Repeat _5__ times. Do _1__ times a day.  Copyright  VHI. All rights reserved.  Slow Contraction: Gravity Eliminated (Side-Lying)    Lie on left side, hips and knees slightly bent. Slowly squeeze pelvic floor for _5__ seconds. Rest for _5__ seconds. Repeat _5__ times. Do _1__ times a day.   Copyright  VHI. All rights reserved.  [facility] [address] [place], [place] [address] Phone # [phone] Fax [phone]

## 2019-10-03 NOTE — Therapy (Signed)
[facility] [address], [place], [place], [address] Phone: [phone]   Fax:  [phone]  Physical Therapy Treatment  Patient Details  Name: Brianna Barr MRN: [id number] Date of Birth: 12-25-1995 Referring Provider (PT): Dr. Darrol Poke   Encounter Date: 10/03/2019  PT End of Session - 10/03/19 0930    Visit Number  11    Date for PT Re-Evaluation  10/17/19    Authorization Type  [facility]    PT Start Time  0845    PT Stop Time  0925    PT Time Calculation (min)  40 min    Activity Tolerance  Patient tolerated treatment well;No increased pain    Behavior During Therapy  WFL for tasks assessed/performed       Past Medical History:  Diagnosis Date  . Anxiety   . Asthma   . Depression     Past Surgical History:  Procedure Laterality Date  . WISDOM TOOTH EXTRACTION      There were no vitals filed for this visit.  Subjective Assessment - 10/03/19 0851    Subjective  I had to work this weekend. I have not had leakage during the weekend. The urge to void. I was able to go further with the dilator with 2/10 pain.    Patient Stated Goals  relax the pelvic floor and penile penetration, use a tampon, urge    Currently in Pain?  No/denies    Multiple Pain Sites  No         [facility] PT Assessment - 10/03/19 0001      Strength   Right Hip ABduction  4+/5    Left Hip ABduction  4/5                Pelvic Floor Special Questions - 10/03/19 0001    Pelvic Floor Internal Exam  Patient confirms identification and approves PT  to assess pelvic floor and treatment    Exam Type  Vaginal    Strength  Flicker   by end of treatment is more 2/5       [facility] Adult PT Treatment/Exercise - 10/03/19 0001      Neuro Re-ed    Neuro Re-ed Details   bulging of the pelvic floor, tactile cues to contract the pelvic floor instead of bulging, used tapping of the muscles and tactile cues to the ischial  tuberosities      Lumbar Exercises: Stretches   Active Hamstring Stretch  Right;Left;30 seconds;1 rep    Active Hamstring Stretch Limitations  using strap all three directions    ITB Stretch  Right;Left;1 rep;30 seconds    ITB Stretch Limitations  with strap    Other Lumbar Stretch Exercise  right, left, hip adductor stretch with strap holding 30 seconds      Manual Therapy   Manual Therapy  Joint mobilization;Internal Pelvic Floor    Joint Mobilization  using the mulligan belt for joiint mobilization to bil. hips for distraction, lateral and infertior glide to increase hip ER, anterior glide of the femoral head and stretch the hip flexors    Internal Pelvic Floor  release of the perineal body, ischiocavernosus, bulbocavernosus, and superior transverse, and levator ani in sidely             PT Education - 10/03/19 0925    Education Details  bulging of the pelvic floor and contracting    Person(s) Educated  Patient    Methods  Explanation;Demonstration;Handout  Comprehension  Verbalized understanding;Returned demonstration       PT Short Term Goals - 08/22/19 0845      PT SHORT TERM GOAL #4   Title  understand vaginal health and how to restore ph balance of the vagina    Time  4    Period  Weeks    Status  Achieved    Target Date  08/22/19        PT Long Term Goals - 10/03/19 0935      PT LONG TERM GOAL #1   Title  independent with advanced HEP    Baseline  still learning    Time  12    Period  Weeks    Status  On-going      PT LONG TERM GOAL #2   Title  able to have penile penetration with Marinoff scalr 1/3 due to improve tissue elongation    Baseline  uses the first dilator and able to put in further with pain level 2/10    Time  12    Period  Weeks    Status  On-going      PT LONG TERM GOAL #3   Title  able to fully empty her bladder due to relaxation of the pelvic floor    Time  12    Period  Weeks    Status  Achieved      PT LONG TERM GOAL #4    Title  urinary leakage decreased >/= 80% due to decreased over active bladder activity and understands ways to calm the bladder down    Baseline  35% better    Time  12    Period  Weeks    Status  On-going      PT LONG TERM GOAL #5   Title  not having to wear a pad due to reduction of urinary leakage    Time  12    Period  Weeks    Status  On-going            Plan - 10/03/19 0930    Clinical Impression Statement  Patient was able to tolerate the internal soft tissue work in sidely better than in supine. Patient was bearing down instead of contracting and needed tactile cues to perform correctly. Patient does better with hip mobilization and starting to have increased mobility. Patient pelvic floor strength is 1/5 but almost a 2/5 with practice. Patient did not leak urine this weekend. She still has the urge to void. Patient has weakness in bilateral hip abduction. Patient will benefit from skilled therapy to work on relaxation of the pelvic floor, education on contracting the pelvic floor correctly, and reducing pain so she will be able to have penetration of the vaginal canal.    Personal Factors and Comorbidities  Sex;Profession    Examination-Activity Limitations  Continence;Toileting    Stability/Clinical Decision Making  Evolving/Moderate complexity    Rehab Potential  Excellent    PT Frequency  1x / week    PT Duration  12 weeks    PT Treatment/Interventions  Biofeedback;Cryotherapy;Electrical Stimulation;Moist Heat;Ultrasound;Therapeutic exercise;Therapeutic activities;Neuromuscular re-education;Patient/family education;Dry needling;Manual techniques    PT Next Visit Plan  release around the vaginal canal,  stretch the hip flexors, hamstring and hip adductors, back strengthening with hip abduction and extension    PT Home Exercise Plan  Access Code: [email or web address]    Consulted and Agree with Plan of Care  Patient       Patient will benefit from skilled  therapeutic intervention in  order to improve the following deficits and impairments:  Decreased coordination, Decreased range of motion, Increased fascial restricitons, Increased muscle spasms, Decreased activity tolerance, Pain, Impaired flexibility, Decreased strength, Decreased mobility  Visit Diagnosis: Cramp and spasm  Unspecified lack of coordination  Urge incontinence of urine  Vaginismus     Problem List Patient Active Problem List   Diagnosis Date Noted  . Anxiety and depression 07/07/2019  . Mild intermittent asthma without complication 07/07/2019  . Seasonal allergies 07/07/2019  . Plantar fasciitis 07/07/2019  . Hyperhidrosis 07/07/2019  . OAB (overactive bladder) 07/07/2019  . Allergy 08/06/2018    Eulis Foster, PT 10/03/19 9:36 AM   [facility] [address] [place], [place], [address] Phone: [phone]   Fax:  [phone]  Name: Xiao Graul MRN: [id number] Date of Birth: Nov 10, 1995

## 2019-10-04 ENCOUNTER — Ambulatory Visit (INDEPENDENT_AMBULATORY_CARE_PROVIDER_SITE_OTHER): Payer: Commercial Managed Care - PPO | Admitting: Psychiatry

## 2019-10-04 ENCOUNTER — Other Ambulatory Visit: Payer: Self-pay

## 2019-10-04 DIAGNOSIS — F411 Generalized anxiety disorder: Secondary | ICD-10-CM | POA: Diagnosis not present
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
## 2019-10-10 ENCOUNTER — Other Ambulatory Visit: Payer: Self-pay

## 2019-10-10 ENCOUNTER — Ambulatory Visit: Payer: Commercial Managed Care - PPO | Admitting: Orthotics

## 2019-10-10 DIAGNOSIS — M79672 Pain in left foot: Secondary | ICD-10-CM

## 2019-10-10 DIAGNOSIS — M722 Plantar fascial fibromatosis: Secondary | ICD-10-CM

## 2019-10-10 NOTE — Progress Notes (Signed)
Patient came in today to pick up custom made foot orthotics.  The goals were accomplished and the patient reported no dissatisfaction with said orthotics.  Patient was advised of breakin period and how to report any issues. 

## 2019-10-11 ENCOUNTER — Encounter: Payer: Self-pay | Admitting: Physical Therapy

## 2019-10-11 ENCOUNTER — Ambulatory Visit: Payer: Commercial Managed Care - PPO | Admitting: Physical Therapy

## 2019-10-11 DIAGNOSIS — R252 Cramp and spasm: Secondary | ICD-10-CM | POA: Diagnosis not present

## 2019-10-11 DIAGNOSIS — N3941 Urge incontinence: Secondary | ICD-10-CM

## 2019-10-11 DIAGNOSIS — N942 Vaginismus: Secondary | ICD-10-CM

## 2019-10-11 DIAGNOSIS — R279 Unspecified lack of coordination: Secondary | ICD-10-CM

## 2019-10-11 NOTE — Therapy (Addendum)
[facility] [address] [place], [place], [address] Phone: [phone]   Fax:  [phone]  Physical Therapy Treatment  Patient Details  Name: Brianna Barr MRN: [id number] Date of Birth: 1996-10-25 Referring Provider (PT): Dr. Darrol Poke   Encounter Date: 10/11/2019  PT End of Session - 10/11/19 0852    Visit Number  12    Date for PT Re-Evaluation  01/09/20    Authorization Type  [facility]    PT Start Time  0845    PT Stop Time  0925    PT Time Calculation (min)  40 min    Activity Tolerance  Patient tolerated treatment well;No increased pain    Behavior During Therapy  WFL for tasks assessed/performed       Past Medical History:  Diagnosis Date  . Anxiety   . Asthma   . Depression     Past Surgical History:  Procedure Laterality Date  . WISDOM TOOTH EXTRACTION      There were no vitals filed for this visit.  Subjective Assessment - 10/11/19 0850    Subjective  I can get half of the dilator in and is a 2/10 pain level. I am no the first dilator. I am able to go side to tside with dilator but unable to press in further due to feeling like a wall.    Patient Stated Goals  relax the pelvic floor and penile penetration, use a tampon, urge    Currently in Pain?  No/denies         [facility] PT Assessment - 10/11/19 0001      Assessment   Medical Diagnosis  N94.2 Vaginismus    Referring Provider (PT)  Dr. Darrol Poke    Prior Therapy  none; sees chiropractor for back pain      Precautions   Precautions  None      Restrictions   Weight Bearing Restrictions  No      Home Environment   Living Environment  Private residence      Prior Function   Level of Independence  Independent    Vocation  Full time employment    Vocation Requirements  nurse      Cognition   Overall Cognitive Status  Within Functional Limits for tasks assessed      Posture/Postural Control   Posture/Postural  Control  No significant limitations      ROM / Strength   AROM / PROM / Strength  AROM;PROM;Strength      PROM   Right Hip External Rotation   75    Left Hip External Rotation   70      Strength   Right Hip ABduction  4+/5    Left Hip ABduction  4/5                Pelvic Floor Special Questions - 10/11/19 0001    Pelvic Floor Internal Exam  Patient confirms identification and approves PT  to assess pelvic floor and treatment    Exam Type  Vaginal    Strength  weak squeeze, no lift   uses contraction of toes to facilitate the pelvic floor       [facility] Adult PT Treatment/Exercise - 10/11/19 0001      Self-Care   Self-Care  Other Self-Care Comments    Other Self-Care Comments   with dilator start to use hip movement, move dilator in and out , how to progress to the second dilator  Lumbar Exercises: Stretches   Active Hamstring Stretch  Right;Left;30 seconds;1 rep    Active Hamstring Stretch Limitations  using strap all three directions    Hip Flexor Stretch  Right;Left;1 rep;30 seconds    Hip Flexor Stretch Limitations  manually    ITB Stretch  Right;Left;1 rep;30 seconds    ITB Stretch Limitations  with strap    Other Lumbar Stretch Exercise  right, left, hip adductor stretch with strap holding 30 seconds      Manual Therapy   Manual Therapy  Internal Pelvic Floor    Internal Pelvic Floor  release around the cervix, along the levator ani, ITLA, OI, bulbocavernosus   right sidely            PT Education - 10/11/19 [phone]    Education Details  how to start moving her legs with the dilator in and how to progress to the second dilator    Person(s) Educated  Patient    Methods  Explanation    Comprehension  Verbalized understanding       PT Short Term Goals - 10/11/19 0853      PT SHORT TERM GOAL #1   Title  independent with initial HEP    Time  4    Period  Weeks    Status  Achieved    Target Date  08/22/19      PT SHORT TERM GOAL #2   Title   able to tolerate external palpation to the vulva area with pain level </= 3/10    Baseline  working on it    Time  4    Period  Weeks    Status  Achieved    Target Date  08/22/19      PT SHORT TERM GOAL #3   Title  using pelvic floor meditation to relax the pelvic floor and decrease her aniety    Time  4    Period  Weeks    Status  Achieved    Target Date  08/22/19      PT SHORT TERM GOAL #4   Title  understand vaginal health and how to restore ph balance of the vagina    Time  4    Period  Weeks    Status  Achieved    Target Date  08/22/19        PT Long Term Goals - 10/11/19 0854      PT LONG TERM GOAL #1   Title  independent with advanced HEP    Baseline  still learning    Time  12    Period  Weeks    Status  On-going      PT LONG TERM GOAL #2   Title  able to have penile penetration with Marinoff scalr 1/3 due to improve tissue elongation    Baseline  uses the first dilator and able to put in further with pain level 2/10, 1/2 way in    Time  12    Period  Weeks    Status  On-going      PT LONG TERM GOAL #3   Title  able to fully empty her bladder due to relaxation of the pelvic floor    Time  12    Period  Weeks    Status  Achieved      PT LONG TERM GOAL #4   Title  urinary leakage decreased >/= 80% due to decreased over active bladder activity and understands ways to calm the  bladder down    Baseline  none since last visit    Time  12    Period  Weeks    Status  On-going      PT LONG TERM GOAL #5   Title  not having to wear a pad due to reduction of urinary leakage    Baseline  wears pad at work    Time  12    Period  Weeks    Status  On-going            Plan - 10/11/19 0925    Clinical Impression Statement  Patient has not had urinary leakage for 2 weeks. Patient pelvic floor strength has increased to 2/5 and uses her contraction of the toes to help facilitate. Patient had decreased mobility of the cervix and was released with fasical work.  Patient was instructed on how to advance herself with the dilator. Patient has orthotics for her feet to help her back. Patient has weakness in the hip abductors. Patient has increasaed bilateral hip rotation. Patient will benefit from skilled therapy to work on relaxation fo the pelvic floor, education on contracting the pelvic floor correctly and reduce pain so she will be able to have penetration of the vaginal canal.    Personal Factors and Comorbidities  Sex;Profession    Examination-Activity Limitations  Continence;Toileting    Examination-Participation Restrictions  Interpersonal Relationship    Stability/Clinical Decision Making  Evolving/Moderate complexity    Rehab Potential  Excellent    PT Frequency  1x / week    PT Duration  12 weeks    PT Treatment/Interventions  Biofeedback;Cryotherapy;Electrical Stimulation;Moist Heat;Ultrasound;Therapeutic exercise;Therapeutic activities;Neuromuscular re-education;Patient/family education;Dry needling;Manual techniques    PT Next Visit Plan  release around the vaginal canal internally in sidely,  stretch the hip flexors, hamstring and hip adductors, back strengthening with hip abduction and extension, see f she progressed with the second dilator    PT Home Exercise Plan  Access Code: [email or web address]    Recommended Other Services  renewal note sent    Consulted and Agree with Plan of Care  Patient       Patient will benefit from skilled therapeutic intervention in order to improve the following deficits and impairments:  Decreased coordination, Decreased range of motion, Increased fascial restricitons, Increased muscle spasms, Decreased activity tolerance, Pain, Impaired flexibility, Decreased strength, Decreased mobility  Visit Diagnosis: Cramp and spasm - Plan: PT plan of care cert/re-cert  Unspecified lack of coordination - Plan: PT plan of care cert/re-cert  Urge incontinence of urine - Plan: PT plan of care cert/re-cert  Vaginismus - Plan: PT  plan of care cert/re-cert     Problem List Patient Active Problem List   Diagnosis Date Noted  . Anxiety and depression 07/07/2019  . Mild intermittent asthma without complication 78/67/6720  . Seasonal allergies 07/07/2019  . Plantar fasciitis 07/07/2019  . Hyperhidrosis 07/07/2019  . OAB (overactive bladder) 07/07/2019  . Allergy 08/06/2018    Earlie Counts, PT 10/11/19 9:32 AM   [facility] [address], [place], [place], [address] Phone: [phone]   Fax:  [phone]  Name: Brianna Barr MRN: [id number] Date of Birth: 01-21-1996  PHYSICAL THERAPY DISCHARGE SUMMARY  Visits from Start of Care: 12  Current functional level related to goals / functional outcomes: See above.    Remaining deficits: See above. Did not return after the last visit on 10/11/2019.    Education / Equipment: HEP Plan:  Patient goals were not met. Patient is being discharged due to not returning since the last visit. Thank you for the referral. Earlie Counts, PT 12/26/19 9:27 AM   ?????

## 2019-10-19 ENCOUNTER — Ambulatory Visit: Payer: Commercial Managed Care - PPO | Admitting: Physical Therapy

## 2019-10-21 ENCOUNTER — Other Ambulatory Visit: Payer: Self-pay | Admitting: Family Medicine

## 2019-10-21 DIAGNOSIS — F419 Anxiety disorder, unspecified: Secondary | ICD-10-CM

## 2019-10-21 DIAGNOSIS — F329 Major depressive disorder, single episode, unspecified: Secondary | ICD-10-CM

## 2019-10-21 DIAGNOSIS — F32A Depression, unspecified: Secondary | ICD-10-CM

## 2019-10-25 ENCOUNTER — Encounter: Payer: Commercial Managed Care - PPO | Admitting: Physical Therapy

## 2019-11-03 ENCOUNTER — Other Ambulatory Visit: Payer: Self-pay

## 2019-11-03 ENCOUNTER — Encounter: Payer: Self-pay | Admitting: Family Medicine

## 2019-11-03 ENCOUNTER — Ambulatory Visit: Payer: Commercial Managed Care - PPO | Admitting: Family Medicine

## 2019-11-03 VITALS — BP 110/80 | HR 92 | Temp 97.9°F | Wt 228.0 lb

## 2019-11-03 DIAGNOSIS — F419 Anxiety disorder, unspecified: Secondary | ICD-10-CM | POA: Diagnosis not present

## 2019-11-03 DIAGNOSIS — F32A Depression, unspecified: Secondary | ICD-10-CM

## 2019-11-03 DIAGNOSIS — F329 Major depressive disorder, single episode, unspecified: Secondary | ICD-10-CM | POA: Diagnosis not present

## 2019-11-03 MED ORDER — BUPROPION HCL ER (XL) 300 MG PO TB24: 300.0000 mg | ORAL_TABLET | Freq: Every day | ORAL | 3 refills | Status: DC

## 2019-11-03 NOTE — Progress Notes (Signed)
Subjective:    Patient ID: Brianna Barr, female    DOB: Apr 05, 1996, 24 y.o.   MRN: [id number]  No chief complaint on file.   HPI Patient was seen today for f/u on anxiety and depression.   Since last OFV viibryd was d/c'd and pt started on wellbutrin xl 150 mg.  Pt notes mood is ok, energy is good, and sleep is good.  Notes some stress at work.  Pt started scrap booking again.  Feels like mood could be better.  Past Medical History:  Diagnosis Date  . Anxiety   . Asthma   . Depression     No Known Allergies  ROS General: Denies fever, chills, night sweats, changes in weight, changes in appetite HEENT: Denies headaches, ear pain, changes in vision, rhinorrhea, sore throat CV: Denies CP, palpitations, SOB, orthopnea Pulm: Denies SOB, cough, wheezing GI: Denies abdominal pain, nausea, vomiting, diarrhea, constipation GU: Denies dysuria, hematuria, frequency, vaginal discharge Msk: Denies muscle cramps, joint pains Neuro: Denies weakness, numbness, tingling Skin: Denies rashes, bruising Psych: Denies hallucinations  +anxiety and depression    Objective:    Blood pressure 110/80, pulse 92, temperature 97.9 F (36.6 C), temperature source Temporal, weight 228 lb (103.4 kg), SpO2 98 %.   Gen. Pleasant, well-nourished, in no distress, normal affect   HEENT: [place]/AT, face symmetric, no scleral icterus, PERRLA, EOMI, nares patent without drainage Lungs: no accessory muscle use Cardiovascular: RRR, no peripheral edema Musculoskeletal: No deformities, no cyanosis or clubbing, normal tone Neuro:  A&Ox3, CN II-XII intact, normal gait Skin:  Warm, no lesions/ rash   Wt Readings from Last 3 Encounters:  11/03/19 228 lb (103.4 kg)  09/29/19 232 lb (105.2 kg)  08/18/19 232 lb (105.2 kg)    Lab Results  Component Value Date   WBC 8.5 08/18/2019   HGB 12.7 08/18/2019   HCT 38.6 08/18/2019   PLT 411.0 (H) 08/18/2019   GLUCOSE 84 08/18/2019   NA 140 08/18/2019   K 4.0  08/18/2019   CL 106 08/18/2019   CREATININE 0.86 08/18/2019   BUN 11 08/18/2019   CO2 24 08/18/2019   TSH 2.38 08/18/2019   HGBA1C 5.4 08/18/2019    Assessment/Plan:  Anxiety and depression  -improving -PHQ 9 score 9.  Was 12 on 09/29/19 -GAD 7 score 7 Was 17 on 09/29/19 -despite improvement in symptoms pt request increase in dose.   Will increase to Wellbutrin XL 300 mg. -pt to schedule appt for counseling. - Plan: buPROPion (WELLBUTRIN XL) 300 MG 24 hr tablet  F/u 4-6 wks  Abbe Amsterdam, MD

## 2019-11-04 ENCOUNTER — Other Ambulatory Visit: Payer: Self-pay | Admitting: Family Medicine

## 2019-11-04 DIAGNOSIS — E559 Vitamin D deficiency, unspecified: Secondary | ICD-10-CM

## 2019-11-06 ENCOUNTER — Encounter: Payer: Self-pay | Admitting: Family Medicine

## 2019-11-08 ENCOUNTER — Other Ambulatory Visit: Payer: Self-pay

## 2019-11-08 ENCOUNTER — Telehealth: Payer: Self-pay | Admitting: Family Medicine

## 2019-11-08 ENCOUNTER — Ambulatory Visit (INDEPENDENT_AMBULATORY_CARE_PROVIDER_SITE_OTHER): Payer: Commercial Managed Care - PPO | Admitting: Psychiatry

## 2019-11-08 DIAGNOSIS — F411 Generalized anxiety disorder: Secondary | ICD-10-CM | POA: Diagnosis not present

## 2019-11-08 NOTE — Telephone Encounter (Signed)
Pt has an appt next month and would like a a refill on albuterol inhaler. [facility]. Pt has exercise induce asthma

## 2019-11-08 NOTE — Progress Notes (Signed)
      [facility] Counselor/Therapist Progress Note  Patient ID: Brianna Barr, MRN: [id number],    Date: 11/08/2019  Time Spent: 60 minutes  9:00am to 10:00am  Treatment Type: Individual Therapy  Reported Symptoms: anxiety,depression, loneliness, feeling hurt by family who are not understanding of her history  Mental Status Exam:  Appearance:   Casual     Behavior:  Appropriate and Sharing  Motor:  Normal  Speech/Language:   Normal Rate  Affect:  Depressed, Tearful and anxious  Mood:  anxious and depressed  Thought process:  normal  Thought content:    WNL  Sensory/Perceptual disturbances:    WNL  Orientation:  oriented to person, place, time/date, situation, day of week, month of year and year  Attention:  Good  Concentration:  Good  Memory:  WNL  Fund of knowledge:   Good  Insight:    Good  Judgment:   Good  Impulse Control:  Good   Risk Assessment: Danger to Self:  No Self-injurious Behavior: No Danger to Others: No Duty to Warn:no Physical Aggression / Violence:No  Access to Firearms a concern: No  Gang Involvement:No   Subjective:  Patient in today reporting anxiety, depression, loneliness, and not feeling connected to others.  Feelings of "solitude exaggerated during pandemic." Denies any SI.   Interventions: Cognitive Behavioral Therapy, Solution-Oriented/Positive Psychology and Ego-Supportive  Diagnosis:   ICD-10-CM   1. Generalized anxiety disorder  F41.1     Plan of Care:   Patient not signing tx plan on computer screen due to COVID.   Treatment Goals: Goals will remain on tx plan as patient works with strategies to meet her goals.  Progress will be noted each session and documented in "Progress" section of Plan.  Long term goal: Reduce overall level, frequency, and intensity of the anxiety so that daily functioning is not impaired.  Short term goal: Increase understanding of beliefs and messages that produce anxiety, depression, or  "worries".  Strategy: Identify, challenge, and replace anxious/depressive/fearful self-talk with positive, realistic, and empowering self-talk.  Progress: Patient feeling alone, tearful, and having especially hard time with negative self-talk, unrealistic expectations of self, and grieving death of fiance.  Worked on her pain, her negative mindset and beliefs about herself.  Processed these issues and she is to follow through on eliminating self-negating thoughts, and self-talk that is unfair and judgmental. Towards end of session she shared she "felt a glimmer of hope".  Talked about what feels more hopeful to her and she shared she feels heard, understood, and some encouraged in her situation. Worked on using "I Am" statements versus "I'm going to try".  Also to more closely monitory how she is feeling and when having lower moments to especially note what her prior thoughts have been, working to intercept them and replace with more positive, reality-based thoughts that are more empowering.  Goal review and progress/hard work noted with patient.  Next appt within 2 weeks.   Mathis Fare, LCSW

## 2019-11-08 NOTE — Telephone Encounter (Signed)
Rx was last filled by historic provider in 2015, ok to send a new script to pt pharmacy

## 2019-11-08 NOTE — Telephone Encounter (Signed)
Ok

## 2019-11-08 NOTE — Telephone Encounter (Signed)
Message Routed to PCP CMA 

## 2019-11-09 ENCOUNTER — Other Ambulatory Visit: Payer: Self-pay

## 2019-11-09 DIAGNOSIS — J4520 Mild intermittent asthma, uncomplicated: Secondary | ICD-10-CM

## 2019-11-09 MED ORDER — ALBUTEROL SULFATE HFA 108 (90 BASE) MCG/ACT IN AERS: 2.0000 | INHALATION_SPRAY | Freq: Four times a day (QID) | RESPIRATORY_TRACT | 1 refills | Status: DC | PRN

## 2019-11-09 NOTE — Telephone Encounter (Signed)
Refill sent per Dr Salomon Fick approval

## 2019-11-22 ENCOUNTER — Other Ambulatory Visit: Payer: Self-pay

## 2019-11-22 ENCOUNTER — Ambulatory Visit (INDEPENDENT_AMBULATORY_CARE_PROVIDER_SITE_OTHER): Payer: Commercial Managed Care - PPO | Admitting: Psychiatry

## 2019-11-22 DIAGNOSIS — F411 Generalized anxiety disorder: Secondary | ICD-10-CM

## 2019-11-22 NOTE — Progress Notes (Signed)
      [facility] Counselor/Therapist Progress Note  Patient ID: Brianna Barr, MRN: [id number],    Date: 11/22/2019  Time Spent: 60 minutes  9:00am to 10:00am  Treatment Type: Individual Therapy  Reported Symptoms: anxiety, some depression,sadness, frustration, self-blame for things that she did not cause  Mental Status Exam:  Appearance:   Casual     Behavior:  Appropriate and Sharing  Motor:  Normal  Speech/Language:   Normal Rate  Affect:  anxious, some depression  Mood:  anxious and sad  Thought process:  normal  Thought content:    WNL  Sensory/Perceptual disturbances:    WNL  Orientation:  oriented to person, place, time/date, situation, day of week, month of year and year  Attention:  Good  Concentration:  Good  Memory:  WNL  Fund of knowledge:   Good  Insight:    Good  Judgment:   Good  Impulse Control:  Good   Risk Assessment: Danger to Self:  No Self-injurious Behavior: No Danger to Others: No Duty to Warn:no Physical Aggression / Violence:No  Access to Firearms a concern: No  Gang Involvement:No   Subjective:  Patient today reports symptoms noted above and focused more today on self-blame and some "self-sabotage". Wants to work on this some today.   Interventions: Cognitive Behavioral Therapy and Solution-Oriented/Positive Psychology  Diagnosis:   ICD-10-CM   1. Generalized anxiety disorder  F41.1     Plan of Care: Patient not signing tx plan on computer screen due to COVID.   Treatment Goals: Goals will remain on tx plan as patient works with strategies to meet her goals. Progress will be noted each session and documented in "Progress" section of Plan.  Long term goal: Reduce overalllevel, frequency, and intensity of the anxiety so that daily functioning is not impaired.  Short term goal: Increase understandingof beliefs and messages that produce anxiety, depression, or "worries".  Strategy: Identify, challenge,and replace  anxious/depressive/fearful self-talk with positive, realistic, and empowering self-talk.  Progress: Patient working today on better understanding her beliefs/messages that lead to anxiety. Also focusing on her self-blaming and self-sabotage which she reports she has done this since sometime in elementary school after moving to [place]. She feels anxiety influences this as social anxiety is part of her overall anxiety.  Wants to be able to be more comfortable with people and stop sabotaging. Shares a recent situation where a person had approached her and wanting to be closer, and initially she continued contact until he wanted to make contact via Facetime, which she wasn't comfortable with, and she suggested they stop contact, and she said she wasn't ready yet. Later wishes (maybe) she had not pushed him away due to her fears.  Is to think about this more as she is not sure.  Shared other situations where her negative blaming of herself and we processed them and the beliefs she has about them. Looked at ways of interrupting those thoughts/beliefs, and replacing them with more positive, reality-based, empowering thought patterns that do not support anxiety nor depression, but do support self-affirmation. Goal review and progress noted with patient.  Next appt within 2 weeks.   Mathis Fare, LCSW

## 2019-11-28 ENCOUNTER — Other Ambulatory Visit: Payer: Self-pay | Admitting: Family Medicine

## 2019-11-28 DIAGNOSIS — F419 Anxiety disorder, unspecified: Secondary | ICD-10-CM

## 2019-11-28 DIAGNOSIS — F32A Depression, unspecified: Secondary | ICD-10-CM

## 2019-11-28 DIAGNOSIS — F329 Major depressive disorder, single episode, unspecified: Secondary | ICD-10-CM

## 2019-12-06 ENCOUNTER — Other Ambulatory Visit: Payer: Self-pay

## 2019-12-06 ENCOUNTER — Ambulatory Visit (INDEPENDENT_AMBULATORY_CARE_PROVIDER_SITE_OTHER): Payer: Commercial Managed Care - PPO | Admitting: Psychiatry

## 2019-12-06 DIAGNOSIS — F411 Generalized anxiety disorder: Secondary | ICD-10-CM | POA: Diagnosis not present

## 2019-12-06 NOTE — Progress Notes (Signed)
      [facility] Counselor/Therapist Progress Note  Patient ID: Brianna Barr, MRN: [id number],    Date: 12/06/2019  Time Spent: 60 minutes   9:00am to 10:00am  Treatment Type: Individual Therapy  Reported Symptoms:  Depression, anxiety, hopelessness (but no SI), frustration, anger at her deceased fiance December 31, 2022 is the anniversary date for his date of death).  Having a more difficult time this year than last year," feels like it's been building and now it's here", room-mates are supportive.  Mental Status Exam:  Appearance:   Casual     Behavior:  Appropriate, Sharing and Motivated  Motor:  Normal  Speech/Language:   Normal Rate  Affect:  Depressed and anxious  Mood:  anxious and depressed  Thought process:  normal  Thought content:    WNL  Sensory/Perceptual disturbances:    WNL  Orientation:  oriented to person, place, time/date, situation, day of week, month of year and year  Attention:  Good  Concentration:  Good  Memory:  WNL  Fund of knowledge:   Good  Insight:    Good  Judgment:   Good  Impulse Control:  Good   Risk Assessment: Danger to Self:  No Self-injurious Behavior: No Danger to Others: No Duty to Warn:no Physical Aggression / Violence:No  Access to Firearms a concern: No  Gang Involvement:No   Subjective:  Patient very in touch with her anger and sadness in the death of her finace, and the anniversary date is tomorrow.  Also assuming that "I'll never have another chance to find that right person meant for me."  Tearful, angry, hurt, feeling less worthy of another opportunity.   Interventions: Cognitive Behavioral Therapy and Solution-Oriented/Positive Psychology  Diagnosis:   ICD-10-CM   1. Generalized anxiety disorder  F41.1      Plan of Care: Patient not signing tx plan on computer screen due to COVID.   Treatment Goals: Goals will remain on tx plan as patient works with strategies to meet her goals. Progress will be noted each session  and documented in "Progress" section of Plan.  Long term goal: Reduce overalllevel, frequency, and intensity of the anxiety so that daily functioning is not impaired.  Short term goal: Increase understandingof beliefs and messages that produce anxiety, depression, or "worries".  Strategy: Identify, challenge,and replace anxious/depressive/fearful self-talk with positive, realistic, and empowering self-talk.  Progress: Patient very upset today with the anniversary date tomorrow of her finace's death.  Very tearful and needing to share and talk through her thoughts and feelings today.  Mixed sadness and unresolved grief, anger, assuming she will never have any other chances of "finding that special person for me".  Also tags some positive ("but bittersweet") thoughts as she and fiance had dreamed of their life together after getting married. Did good job in sharing her feelings and thoughts openly today and in working on changing her current anxious/negative/depressive thoughts into more positive, reality-based, empowering thoughts.  Worked with several of the negative/depressive thoughts she has been having and help her in changing those specific thoughts.  This seemed helpful to patient and she is to continue this between sessions. Goal review and progress/efforts noted with patient.     Next appt within 2 weeks.   Mathis Fare, LCSW

## 2019-12-14 ENCOUNTER — Other Ambulatory Visit: Payer: Self-pay

## 2019-12-15 ENCOUNTER — Telehealth (INDEPENDENT_AMBULATORY_CARE_PROVIDER_SITE_OTHER): Payer: Commercial Managed Care - PPO | Admitting: Family Medicine

## 2019-12-15 DIAGNOSIS — G4700 Insomnia, unspecified: Secondary | ICD-10-CM

## 2019-12-15 DIAGNOSIS — F329 Major depressive disorder, single episode, unspecified: Secondary | ICD-10-CM

## 2019-12-15 DIAGNOSIS — F32A Depression, unspecified: Secondary | ICD-10-CM

## 2019-12-15 DIAGNOSIS — J4520 Mild intermittent asthma, uncomplicated: Secondary | ICD-10-CM

## 2019-12-15 DIAGNOSIS — F419 Anxiety disorder, unspecified: Secondary | ICD-10-CM | POA: Diagnosis not present

## 2019-12-15 NOTE — Progress Notes (Signed)
Virtual Visit via Telephone Note  I connected with Brianna Barr on 12/15/19 at  8:30 AM EST by telephone and verified that I am speaking with the correct person using two identifiers.   I discussed the limitations, risks, security and privacy concerns of performing an evaluation and management service by telephone and the availability of in person appointments. I also discussed with the patient that there may be a patient responsible charge related to this service. The patient expressed understanding and agreed to proceed.  Location patient: home Location provider: work or home office Participants present for the call: patient, provider Patient did not have a visit in the prior 7 days to address this/these issue(s).   History of Present Illness: Pt seen for f/u.  States doing well.  Dose of Wellbutrin XL increased to 300 mg at last visit.  Pt notes feeling better.  Denies issues with appetite or mood.  Sleep is still an issue.  May sleep a few hours or 12 +.  Pt tried OTC sleep aids without relief.  Pt in counseling every other wk.  Pt started an exercise program, but has to use albuterol inhaler.  Requested refill, but was not sure it was done.   Observations/Objective: Patient sounds cheerful and well on the phone. I do not appreciate any SOB. Speech and thought processing are grossly intact. Patient reported vitals:  Assessment and Plan: Anxiety and depression -stable -continue Wellbutrin XL 300 mg daily -continue counseling -given precautions -f/u in 1-2 months  Mild intermittent asthma without complication -Albuterol inhaler refilled Jan 2021.  Pt to check with pharmacy.  If needed will send in new rx.  Insomnia, unspecified type -discussed sleep hygiene -Offered medication.  Pt declines at this time.  Follow Up Instructions: F/u in the next 2 months   99441 5-10 [id number] 11-20 9443 21-30 I did not refer this patient for an OV in the next 24 hours for this/these  issue(s).  I discussed the assessment and treatment plan with the patient. The patient was provided an opportunity to ask questions and all were answered. The patient agreed with the plan and demonstrated an understanding of the instructions.   The patient was advised to call back or seek an in-person evaluation if the symptoms worsen or if the condition fails to improve as anticipated.  I provided 6 minutes of non-face-to-face time during this encounter.   Deeann Saint, MD

## 2019-12-20 ENCOUNTER — Other Ambulatory Visit: Payer: Self-pay

## 2019-12-20 ENCOUNTER — Ambulatory Visit (INDEPENDENT_AMBULATORY_CARE_PROVIDER_SITE_OTHER): Payer: Commercial Managed Care - PPO | Admitting: Psychiatry

## 2019-12-20 DIAGNOSIS — F411 Generalized anxiety disorder: Secondary | ICD-10-CM | POA: Diagnosis not present

## 2019-12-20 NOTE — Progress Notes (Signed)
[facility] Counselor/Therapist Progress Note  Patient ID: Brianna Barr, MRN: [id number],    Date: 12/20/2019  Time Spent: 60 minutes   9:00am to 10:00am  Treatment Type: Individual Therapy  Reported Symptoms: anxiety, depressed (some better), tired mentally and physically and trying to get more sleep by going to bed earlier.    Mental Status Exam:  Appearance:   Casual     Behavior:  Appropriate, Sharing and Motivated  Motor:  Normal  Speech/Language:   Normal Rate  Affect:  anxious, depressed  Mood:  anxious and depressed  Thought process:  normal  Thought content:    WNL  Sensory/Perceptual disturbances:    WNL  Orientation:  oriented to person, place, time/date, situation, day of week, month of year and year  Attention:  Good  Concentration:  Good  Memory:  WNL  Fund of knowledge:   Good  Insight:    Good  Judgment:   Good  Impulse Control:  Good   Risk Assessment: Danger to Self:  No Self-injurious Behavior: No Danger to Others: No Duty to Warn:no Physical Aggression / Violence:No  Access to Firearms a concern: No  Gang Involvement:No   Subjective:  Patient in today reporting anxiety, some depression, " I've been a little better than what I expected" as I expected to be feeling worse after working so hard last session."  Acknowledged the hard work she did do last session and she reports feeling proud of herself for pushing forward to deal with very sensitive issues.  Interventions: Cognitive Behavioral Therapy and Solution-Oriented/Positive Psychology  Diagnosis:   ICD-10-CM   1. Generalized anxiety disorder  F41.1      Plan of Care: Patient not signing tx plan on computer screen due to [place].   Treatment Goals: Goals will remain on tx plan as patient works with strategies to meet her goals. Progress will be noted each session and documented in "Progress" section of Plan.  Long term goal: Reduce overalllevel, frequency, and intensity  of the anxiety so that daily functioning is not impaired.  Short term goal: Increase understandingof beliefs and messages that produce anxiety, depression, or "worries".  Strategy: Identify, challenge,and replace anxious/depressive/fearful self-talk with positive, realistic, and empowering self-talk.  Progress: Patient today reports some improvement : some increase in motivation, decreased depression, some decrease in negative self-talk, and mood is some brighter with better eye contact. Picking  Up from last session, she shared about the anniversay date of finance's death and that she feels she handled it as well as possible and maybe a bit better than she would have thought she would. Took flowers to his grave and cried and that helped her to release some emotional tension.  Sleep is still a problem, interrupted and tossing and turning.  States if it's not better by end of this week,she will contact PCP for something to aid sleep.  Working on increasing her understanding of thoughts and beliefs that lead patient to feel more anxious/depressed/worrried. Some doubt in her ability to recognize "red flags" in relationship to others, and discusses how this has affected her closeness or lac of closeness to other people.  Looked at how she can better discern decisions that are related to relationships in her life and she finds this helpful in being able to look at things more objectively and find "some middle ground" in relationship issues.  On 1-10 scale for anxiety is down to a "5-6" and depression is down to a "3-4" which is  progress for patient.  To continue working on identifying her anxious/depressive thoughts in order to replace them with more adaptive, positive, and reality-based thoughts that do not support anxiety nor depression.  Work to understand we can't stop all our thoughts but we can stop them from controlling [place].  Goal review and progress noted with patient   Next appt within 2 weeks.     Mathis Fare, LCSW

## 2020-01-03 ENCOUNTER — Other Ambulatory Visit: Payer: Self-pay

## 2020-01-03 ENCOUNTER — Ambulatory Visit (INDEPENDENT_AMBULATORY_CARE_PROVIDER_SITE_OTHER): Payer: Commercial Managed Care - PPO | Admitting: Psychiatry

## 2020-01-03 DIAGNOSIS — F411 Generalized anxiety disorder: Secondary | ICD-10-CM
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
## 2020-01-17 ENCOUNTER — Ambulatory Visit (INDEPENDENT_AMBULATORY_CARE_PROVIDER_SITE_OTHER): Payer: Commercial Managed Care - PPO | Admitting: Psychiatry

## 2020-01-17 ENCOUNTER — Other Ambulatory Visit: Payer: Self-pay

## 2020-01-17 DIAGNOSIS — F411 Generalized anxiety disorder: Secondary | ICD-10-CM

## 2020-01-17 NOTE — Progress Notes (Signed)
      [facility] Counselor/Therapist Progress Note  Patient ID: Brianna Barr, MRN: [id number],    Date: 01/17/2020  Time Spent: 60 minutes  9:00am to 10:00am  Treatment Type: Individual Therapy  Reported Symptoms:  Anxiety, sadness, depression, frustration  Mental Status Exam:  Appearance:   Casual     Behavior:  Appropriate, Sharing and Motivated  Motor:  Normal  Speech/Language:   Normal Rate  Affect:  anxious, depressed, sad, frustrated  Mood:  anxious and depressed  Thought process:  goal directed  Thought content:    WNL  Sensory/Perceptual disturbances:    WNL  Orientation:  oriented to person, place, time/date, situation, day of week, month of year and year  Attention:  Good  Concentration:  Good  Memory:  WNL  Fund of knowledge:   Good  Insight:    Good  Judgment:   Good  Impulse Control:  Good   Risk Assessment: Danger to Self:  No Self-injurious Behavior: No Danger to Others: No Duty to Warn:no Physical Aggression / Violence:No  Access to Firearms a concern: No  Gang Involvement:No   Subjective:  Patient reports still struggling with 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 Discussed this more and she is to speak with her supervisor about some possible helpful changes.  Reports anxiety, depression, sadness, frustration.  Interventions: Cognitive Behavioral Therapy and Solution-Oriented/Positive Psychology  Diagnosis:   ICD-10-CM   1. Generalized anxiety disorder  F41.1      Plan of Care: Patient not signing tx plan on computer screen due to COVID.   Treatment Goals: Goals will remain on tx plan as patient works with strategies to meet her goals. Progress will be noted each session and documented in "Progress" section of Plan.  Long term goal: Reduce overalllevel, frequency, and intensity of the anxiety so that daily functioning is not  impaired.  Short term goal: Increase understandingof beliefs and messages that produce anxiety, depression, or "worries".  Strategy: Identify, challenge,and replace anxious/depressive/fearful self-talk with positive, realistic, and empowering self-talk.  Progress: Patient today reporting anxiety, sadness, depression, and frustration.  Issues at work and home, and with grief concerns that she has been working on. Some communication difficulties and patient often feels "not heard".  "It bothers me that I'm not able to say positive good things about myself. Began to talk more about this tearfully and was able to eventually name a couple things, but shared a long-time over-all, feelings that she was not good enough and really didn't have any positives to state about herself. Worked today to better understand the beliefs and messages that lead her to feel more anxous and depressed, as she feels this is impacting how she feels about self and moving forward. Homework discussed and she is to focus more on relating current feelings about self, to the relationship with deceased fiance. Encouraged good self-care including getting out of house daily, talk with friend often, exercise, healthy nutrition, healthier self-talk.  Rates herself as a "6" for anxiety and a "7" for depression.  Denies any SI.   Goal review and progress noted with patient.  Next appt within 2-3 weeks.   Mathis Fare, LCSW

## 2020-02-01 ENCOUNTER — Ambulatory Visit (INDEPENDENT_AMBULATORY_CARE_PROVIDER_SITE_OTHER): Payer: Commercial Managed Care - PPO | Admitting: Psychiatry

## 2020-02-01 ENCOUNTER — Other Ambulatory Visit: Payer: Self-pay

## 2020-02-01 DIAGNOSIS — F411 Generalized anxiety disorder: Secondary | ICD-10-CM | POA: Diagnosis not present

## 2020-02-01 NOTE — Progress Notes (Signed)
      [facility] Counselor/Therapist Progress Note  Patient ID: Brianna Barr, MRN: [id number],    Date: 02/01/2020  Time Spent:  60 minutes   10:00am to 11:00am  Treatment Type: Individual Therapy  Reported Symptoms: anxiety, grief, frustration, some depression  Mental Status Exam:  Appearance:   Casual     Behavior:  Appropriate and Sharing  Motor:  Normal  Speech/Language:   Clear and Coherent  Affect:  anxious  Mood:  anxious and depressed  Thought process:  goal directed  Thought content:    WNL  Sensory/Perceptual disturbances:    WNL  Orientation:  oriented to person, place, time/date, situation, day of week, month of year and year  Attention:  Good  Concentration:  Good  Memory:  WNL  Fund of knowledge:   Good  Insight:    Good  Judgment:   Good  Impulse Control:  Good   Risk Assessment: Danger to Self:  No Self-injurious Behavior: No Danger to Others: No Duty to Warn:no Physical Aggression / Violence:No  Access to Firearms a concern: No  Gang Involvement:No   Subjective: Patient today with anxiety, some depression, grief, and frustrations.  Interventions: Cognitive Behavioral Therapy and Ego-Supportive  Diagnosis:   ICD-10-CM   1. Generalized anxiety disorder  F41.1      Plan of Care: Patient not signing tx plan on computer screen due to COVID.   Treatment Goals: Goals will remain on tx plan as patient works with strategies to meet her goals. Progress will be noted each session and documented in "Progress" section of Plan.  Long term goal: Reduce overalllevel, frequency, and intensity of the anxiety so that daily functioning is not impaired.  Short term goal: Increase understandingof beliefs and messages that produce anxiety, depression, or "worries".  Strategy: Identify, challenge,and replace anxious/depressive/fearful self-talk with positive, realistic, and empowering self-talk.  Progress: Patient in today reporting that  a lot of her anxiety right now is ongoing problem with plantar fascitis.  Unresolved grief re: fiance's death. Patient readily shares her homework from last session. Listed her positives as: caregiver, good listener, Journalist, newspaper, gift giver, encouraging others, flexible, good Runner, broadcasting/film/video.  Realized how down she was on herself last session and reports focusing some on her positives has helped her see her strengths.  Reports having some "improved family relationships". States "I want to change my outlook to be more positive and optimistic." "Tends to feel pessimistic about self and "the world", but occasionally my mood is better . Focused on her self-talk needing to be more positive and she admits that has been a problem "for a while now".  Worked together on her anxious thoughts that tend to block her, and developed some coping strategies for her that will also help her in looking more for positives verus negatives, and in becoming more positive in her self-talk. Less sadness today.  Dealing some better with her grief and showing more determination to follow through on strategies discussed. Continued work on communication especially to more openly talk about difficult feelings. Self-care encouraged and to include frequent contact with friends, healthy nutrition, getting out of house daily, exercise, and more positive self-talk and affirmation. Anxiety rated as a "5/6" today and depression a "6" today, showing improvement.  Goal review and progress noted with patient.  Next appt within 2 weeks.   Mathis Fare, LCSW

## 2020-02-11 ENCOUNTER — Other Ambulatory Visit: Payer: Self-pay | Admitting: Family Medicine

## 2020-02-11 DIAGNOSIS — F32A Depression, unspecified: Secondary | ICD-10-CM

## 2020-02-11 DIAGNOSIS — F329 Major depressive disorder, single episode, unspecified: Secondary | ICD-10-CM

## 2020-02-13 NOTE — Telephone Encounter (Signed)
LVM for pt to call the office to clarify if she is out of her Wellbutrin medication

## 2020-02-14 ENCOUNTER — Ambulatory Visit (INDEPENDENT_AMBULATORY_CARE_PROVIDER_SITE_OTHER): Payer: Commercial Managed Care - PPO | Admitting: Psychiatry

## 2020-02-14 ENCOUNTER — Other Ambulatory Visit: Payer: Self-pay

## 2020-02-14 DIAGNOSIS — F411 Generalized anxiety disorder: Secondary | ICD-10-CM | POA: Diagnosis not present

## 2020-02-14 NOTE — Progress Notes (Signed)
[facility] Counselor/Therapist Progress Note  Patient ID: Brianna Barr, MRN: [id number],    Date: 02/14/2020  Time Spent: 60 minutes  1:00pm to 2:00pm  Treatment Type: Individual Therapy  Reported Symptoms: anxiety, depressions  Mental Status Exam:  Appearance:   Casual     Behavior:  Appropriate and Sharing  Motor:  Normal  Speech/Language:   Normal Rate  Affect:  anxious, depressed  Mood:  anxious and depressed  Thought process:  normal  Thought content:    WNL  Sensory/Perceptual disturbances:    WNL  Orientation:  oriented to person, place, time/date, situation, day of week, month of year and year  Attention:  Good  Concentration:  Good  Memory:  WNL  Fund of knowledge:   Good  Insight:    Good  Judgment:   Good  Impulse Control:  Good   Risk Assessment: Danger to Self:  No Self-injurious Behavior: No Danger to Others: No Duty to Warn:no Physical Aggression / Violence:No  Access to Firearms a concern: No  Gang Involvement:No   Subjective:  Patient in today with anxiety and depression.  Worked on her homework and brought some of it in to share today.  It is a work in progress right now, and wants to continue it beyond today.   Interventions: Cognitive Behavioral Therapy  Diagnosis:   ICD-10-CM   1. Generalized anxiety disorder  F41.1     Plan of Care: Patient not signing tx plan on computer screen due to COVID.   Treatment Goals: Goals will remain on tx plan as patient works with strategies to meet her goals. Progress will be noted each session and documented in "Progress" section of Plan.  Long term goal: Reduce overalllevel, frequency, and intensity of the anxiety so that daily functioning is not impaired.  Short term goal: Increase understandingof beliefs and messages that produce anxiety, depression, or "worries".  Strategy: Identify, challenge,and replace anxious/depressive/fearful self-talk with positive, realistic, and  empowering self-talk.  Progress: Patient in today with depression, anxiety, and some unexpressed feelings and thoughts about a lot of the racial injustices happening in out world today. Obvious that patient is very attuned to the racial conflicts, shootings, and violence going on in [place] and expressing that she is needing to vent her feelings. Went on to share her homework related to her grief over death of fiance. Adds that by "knowing him, I understand myself better." Grieving openly today and wonders if she'll ever be able to love someone "like I did him." Processes this more as she explains she doesn't really discuss this with others.  Does feel she is gaining some strength in working through her grief.  Shared part of her homework assignment id developing a "Coping  Card" to help her cope in times "when I can't think in the moment what to do that might help."  Some of the thing she listed on her card included: "walking, taking a nap, go for a ride in car, use my rocking chair, playing with dog and walking him, scrapbooking, hanging out and facetiming my old roommates, and taking in deep breaths slowly."  Really appreciated being able to talk about her personal issues and the racial injustices happening in our country as that "affects me too and I don't really talk about it with others much but have a lot of feelings and concerns."  Patient much more verbal today which seemed to really help her.    Goal review and progress noted  with patient.  Next appt within 2 weeks.   Mathis Fare, LCSW

## 2020-02-14 NOTE — Progress Notes (Deleted)
      [facility] Counselor/Therapist Progress Note  Patient ID: Brianna Barr, MRN: [id number],    Date: 02/14/2020  Time Spent: ***   Treatment Type: {[facility] AMB THERAPY TYPES:[id number]}  Reported Symptoms: ***  Mental Status Exam:  Appearance:   {PSY:22683}     Behavior:  {PSY:21022743}  Motor:  {PSY:22302}  Speech/Language:   {PSY:22685}  Affect:  {PSY:22687}  Mood:  {PSY:31886}  Thought process:  {PSY:31888}  Thought content:    {PSY:516-221-5767}  Sensory/Perceptual disturbances:    {PSY:8623209291}  Orientation:  {PSY:30297}  Attention:  {PSY:22877}  Concentration:  {PSY:629-690-9631}  Memory:  {PSY:480-492-0100}  Fund of knowledge:   {PSY:629-690-9631}  Insight:    {PSY:629-690-9631}  Judgment:   {PSY:629-690-9631}  Impulse Control:  {PSY:629-690-9631}   Risk Assessment: Danger to Self:  {PSY:22692} Self-injurious Behavior: {PSY:22692} Danger to Others: {PSY:22692} Duty to Warn:{PSY:311194} Physical Aggression / Violence:{PSY:21197} Access to Firearms a concern: {PSY:21197} Gang Involvement:{PSY:21197}  Subjective: ***   Interventions: {PSY:825-593-8993}  Diagnosis:No diagnosis found.  Plan: ***  Mathis Fare, LCSW

## 2020-02-17 ENCOUNTER — Other Ambulatory Visit: Payer: Self-pay

## 2020-02-17 ENCOUNTER — Encounter: Payer: Self-pay | Admitting: Podiatry

## 2020-02-17 ENCOUNTER — Ambulatory Visit: Payer: Commercial Managed Care - PPO | Admitting: Podiatry

## 2020-02-17 VITALS — Temp 98.0°F

## 2020-02-17 DIAGNOSIS — M722 Plantar fascial fibromatosis: Secondary | ICD-10-CM | POA: Diagnosis not present

## 2020-02-17 MED ORDER — MELOXICAM 15 MG PO TABS: 15.0000 mg | ORAL_TABLET | Freq: Every day | ORAL | 2 refills | Status: DC

## 2020-02-20 NOTE — Progress Notes (Signed)
Subjective:   Patient ID: Brianna Barr, female   DOB: 24 y.o.   MRN: [id number]   HPI Patient states she has had a reoccurrence of plantar fasciitis both feet and states she had about 3 to 4 months of relief.  Patient is trying to be active states pain is worse when getting up in the morning after sitting and she is trying to lose weight with obesity complicating factor to her condition   ROS      Objective:  Physical Exam  neurovascular status intact with discomfort in the plantar fascia of both feet inflammation fluid buildup around the medial band with a moderate flatfoot structure and tenderness along the entire fascial band bilateral     Assessment:  Acute plantar fasciitis bilateral with foot structure as complicating factor     Plan:  H&P discussed acute versus chronic plantar fasciitis and for the acute plantar fasciitis today I did sterile prep and injected the plantar fascial bilateral 3 mg Kenalog 5 mg Xylocaine.  I then dispensed night splint with all instructions on usage and I would like her to rotate with this and did discuss ultimately we may need to get her a second night splint.  Educated her on ice therapy oral anti-inflammatories at this time

## 2020-02-28 ENCOUNTER — Other Ambulatory Visit: Payer: Self-pay

## 2020-02-28 ENCOUNTER — Ambulatory Visit (INDEPENDENT_AMBULATORY_CARE_PROVIDER_SITE_OTHER): Payer: Commercial Managed Care - PPO | Admitting: Psychiatry

## 2020-02-28 DIAGNOSIS — F411 Generalized anxiety disorder: Secondary | ICD-10-CM

## 2020-02-28 NOTE — Progress Notes (Signed)
      [facility] Counselor/Therapist Progress Note  Patient ID: Brianna Barr, MRN: [id number],    Date: 02/28/2020  Time Spent: 60 minutes  8:00am to 9:00am  Treatment Type: Individual Therapy  Reported Symptoms: anxiety, depression, sadness  Mental Status Exam:  Appearance:   Neat     Behavior:  Appropriate, Sharing and Motivated  Motor:  Normal  Speech/Language:   Normal Rate  Affect:  anxious, depressed  Mood:  anxious and depressed  Thought process:  normal  Thought content:    WNL  Sensory/Perceptual disturbances:    WNL  Orientation:  oriented to person, place, time/date, situation, day of week, month of year and year  Attention:  Good  Concentration:  Good  Memory:  WNL  Fund of knowledge:   Good  Insight:    Good  Judgment:   Good  Impulse Control:  Good   Risk Assessment: Danger to Self:  No Self-injurious Behavior: No Danger to Others: No Duty to Warn:no Physical Aggression / Violence:No  Access to Firearms a concern: No  Gang Involvement:No   Subjective: Patient in today and reports anxiety, depression, and sadness.  Feeling stressed and knows she needs to take some time off from work as she's not done that over the past year.    Interventions: Cognitive Behavioral Therapy and Solution-Oriented/Positive Psychology  Diagnosis:   ICD-10-CM   1. Generalized anxiety disorder  F41.1       Plan of Care: Patient not signing tx plan on computer screen due to COVID.   Treatment Goals: Goals will remain on tx plan as patient works with strategies to meet her goals. Progress will be noted each session and documented in "Progress" section of Plan.  Long term goal: Reduce overalllevel, frequency, and intensity of the anxiety so that daily functioning is not impaired.  Short term goal: Increase understandingof beliefs and messages that produce anxiety, depression, or "worries".  Strategy: Identify, challenge,and replace  anxious/depressive/fearful self-talk with positive, realistic, and empowering self-talk.  Progress: Patient today is in and reporting anxiety, depression, and sadness.  Picks up from last session in further processing some of her unresolved grief over death of fiance. Shared some experiences they had together and how they were similar and different. Fears/concerns about possibility of being in another relationship at some point but also feels she might be betraying her former fiance. Discussed this at length and worked on her feelings of guilt. To continue homework re: grief journaling as this is helping her be more specific in her expression of feelings and in moving forward. Using "coping card" on several occasions has been helpful as it focuses on her self-care (emotionally and physicall.). Remaining more open in her grief which seems to be encouraging to her.  Goal review and progress noted with patient.  Next appt within 2 wks.   Mathis Fare, LCSW

## 2020-03-13 ENCOUNTER — Other Ambulatory Visit: Payer: Self-pay

## 2020-03-13 ENCOUNTER — Ambulatory Visit (INDEPENDENT_AMBULATORY_CARE_PROVIDER_SITE_OTHER): Payer: Commercial Managed Care - PPO | Admitting: Psychiatry

## 2020-03-13 DIAGNOSIS — F411 Generalized anxiety disorder: Secondary | ICD-10-CM

## 2020-03-13 NOTE — Progress Notes (Signed)
[facility] Counselor/Therapist Progress Note  Patient ID: Brianna Barr, MRN: [id number],    Date: 03/13/2020  Time Spent: 60 minutes  9:00am to 10:00am  Treatment Type: Individual Therapy  Reported Symptoms: Anxiety, some depression  Mental Status Exam:  Appearance:   Neat     Behavior:  Appropriate, Sharing and Motivated  Motor:  Normal  Speech/Language:   Normal Rate  Affect:  anxious, some depression  Mood:  anxious and depressed  Thought process:  normal  Thought content:    WNL  Sensory/Perceptual disturbances:    WNL  Orientation:  oriented to person, place, time/date, situation, day of week, month of year and year  Attention:  Good  Concentration:  Good  Memory:  WNL  Fund of knowledge:   Good  Insight:    Good  Judgment:   Good  Impulse Control:  Good   Risk Assessment: Danger to Self:  No Self-injurious Behavior: No Danger to Others: No Duty to Warn:no Physical Aggression / Violence:No  Access to Firearms a concern: No  Gang Involvement:No   Subjective: Patient today reports anxiety and depression, which is some better.  Has stopped her Wellbutrin from her PCP as she didn't like being on it.  Is to let PCP know, but patient really didn't feel it was helping.  She is reporting this to her physician and want to continue work on her goal without meds.    Interventions: Cognitive Behavioral Therapy and Solution-Oriented/Positive Psychology  Diagnosis:   ICD-10-CM   1. Generalized anxiety disorder  F41.1     Plan of Care: Patient not signing tx plan on computer screen due to COVID.   Treatment Goals: Goals will remain on tx plan as patient works with strategies to meet her goals. Progress will be noted each session and documented in "Progress" section of Plan.  Long term goal: Reduce overalllevel, frequency, and intensity of the anxiety so that daily functioning is not impaired.  Short term goal: Increase understandingof beliefs  and messages that produce anxiety, depression, or "worries".  Strategy: Identify, challenge,and replace anxious/depressive/fearful self-talk with positive, realistic, and empowering self-talk.  Progress: Patient in today reporting her anxiety and depression are decreasing some.  Reports that she feels she is working in therapy and outside of therapy continues to work on better managing anxiety and depression, and also the grief over death of finance. Shares that on 1-10 Grief Scale she was originally an "8-9" but now rates herself a "4" and continues to work with some unresolved grief.  Admits that it's hard to see her "good points" and easier to see her "negatives versus positives". Trying to be more positive in her outlook and view of herself "but it takes much more energy, but there are some days that I'm more positive than negative. "I see my faults as being the bigger than me."  Sees "it as being ok to be positive for others but not for her self---it needs to be restricted for herself. Worked with strategy above in session today especially in intercepting the anxious/depressive/fearful self-talk and thoughts, challenge them, and replace them with more positive, realistic, and empowering self-talk and thought patterns that do not support anxiety/depression/negativity. Continues to use "coping card" as a tool to help manage emotions/feelings in some situations.  To work on "positives" list before next session, and continue interrupting anxious/depressive/negative thoughts as they happen.  Goal review and progress noted with patient.  Next appt within 2 weeks.    Gavin Pound  Karene Fry, LCSW

## 2020-03-27 ENCOUNTER — Ambulatory Visit (INDEPENDENT_AMBULATORY_CARE_PROVIDER_SITE_OTHER): Payer: Commercial Managed Care - PPO | Admitting: Psychiatry

## 2020-03-27 ENCOUNTER — Other Ambulatory Visit: Payer: Self-pay

## 2020-03-27 DIAGNOSIS — F411 Generalized anxiety disorder: Secondary | ICD-10-CM | POA: Diagnosis not present

## 2020-03-27 NOTE — Progress Notes (Signed)
      [facility] Counselor/Therapist Progress Note  Patient ID: Brianna Barr, MRN: [id number],    Date: 03/27/2020  Time Spent: 60 minutes  8:00am to 9:00am  Treatment Type: Individual Therapy  Reported Symptoms: anxiety, some unresolved grief, frustration, depression  Mental Status Exam:  Appearance:   Casual     Behavior:  Appropriate and Sharing  Motor:  Normal  Speech/Language:   Normal Rate  Affect:  anxious, some depression  Mood:  anxious and depressed  Thought process:  goal directed  Thought content:    WNL  Sensory/Perceptual disturbances:    WNL  Orientation:  oriented to person, place, time/date, situation, day of week, month of year and year  Attention:  Good  Concentration:  Good  Memory:  WNL  Fund of knowledge:   Good  Insight:    Good  Judgment:   Good  Impulse Control:  Good   Risk Assessment:Danger to Self:  No Self-injurious Behavior: No Danger to Others: No Duty to Warn:no Physical Aggression / Violence:No  Access to Firearms a concern: No  Gang Involvement:No   Subjective: Patient in today with anxiety, some depression, frustration, and some unresolved grief re: death of fiance but is making progress.    Interventions: Cognitive Behavioral Therapy and Solution-Oriented/Positive Psychology  Diagnosis:   ICD-10-CM   1. Generalized anxiety disorder  F41.1      Plan of Care: Patient not signing tx plan on computer screen due to COVID.   Treatment Goals: Goals will remain on tx plan as patient works with strategies to meet her goals. Progress will be noted each session and documented in "Progress" section of Plan.  Long term goal: Reduce overalllevel, frequency, and intensity of the anxiety so that daily functioning is not impaired.  Short term goal: Increase understandingof beliefs and messages that produce anxiety, depression, or "worries".  Strategy: Identify, challenge,and replace anxious/depressive/fearful self-talk  with positive, realistic, and empowering self-talk.  Progress: Patient in today reporting anxiety, depression, some grief, and frustrations, with anxiety being the strongest symptom.  Talked to fiance's mom recently and that went well. Trying to plan more time off to do things with friends and feeling good about that. Still struggling with accentuation "my negatives rather than my positives". Discussed this as it relates to strategy noted above and worked with patient on identifying, challenging and replacing anxious/depressive thoughts and self-talk to more realistic and positive thoughts and self-talk. Patient has the desire to change "but I've been this way a long time". Shared several recent examples where people had given her compliments and she immediately minimized and that is one area were she is committed to working on also, and states she is going to practice accepting compliments instead of deflecting them.  Rates self a "3/4" on Grief Scale, up from a "4" last session. Patient states she doesn't need her Coping Card a much but still using the strategies listed on that card to help her through tough feelings. Feels she is a "5" on her continuum of reaching her goals to move forward. Feels that one of the things she needs most to work on to be further along on that continuum is that "the frequency of the self-negating will be less and my awareness of it is greater."  Goal review and progress noted with patient.  Next appt within 2 weeks.   Mathis Fare, LCSW

## 2020-04-09 ENCOUNTER — Other Ambulatory Visit: Payer: Self-pay

## 2020-04-09 ENCOUNTER — Ambulatory Visit (INDEPENDENT_AMBULATORY_CARE_PROVIDER_SITE_OTHER): Payer: Commercial Managed Care - PPO | Admitting: Psychiatry

## 2020-04-09 DIAGNOSIS — F411 Generalized anxiety disorder: Secondary | ICD-10-CM | POA: Diagnosis not present

## 2020-04-09 NOTE — Progress Notes (Signed)
      [facility] Counselor/Therapist Progress Note  Patient ID: Brianna Barr, MRN: [id number],    Date: 04/09/2020  Time Spent: 60 minutes   8:00am to 9:00am  Treatment Type: Individual Therapy  Reported Symptoms: anxiety, some depression, guilt  Mental Status Exam:  Appearance:   Casual     Behavior:  Appropriate, Sharing and Motivated  Motor:  Normal  Speech/Language:   Normal Rate  Affect:  anxious, some depression  Mood:  anxious and depressed  Thought process:  normal  Thought content:    WNL  Sensory/Perceptual disturbances:    WNL  Orientation:  oriented to person, place, time/date, situation, day of week, month of year and year  Attention:  Good  Concentration:  Good  Memory:  WNL  Fund of knowledge:   Good  Insight:    Good  Judgment:   Good  Impulse Control:  Good   Risk Assessment: Danger to Self:  No Self-injurious Behavior: No Danger to Others: No Duty to Warn:no Physical Aggression / Violence:No  Access to Firearms a concern: No  Gang Involvement:No   Subjective: Patient today shares that her anxiety continues but she is progressing.  Also having some depression.      Interventions: Cognitive Behavioral Therapy and Solution-Oriented/Positive Psychology  Diagnosis:   ICD-10-CM   1. Generalized anxiety disorder  F41.1     Plan of Care: Patient not signing tx plan on computer screen due to COVID.   Treatment Goals: Goals will remain on tx plan as patient works with strategies to meet her goals. Progress will be noted each session and documented in "Progress" section of Plan.  Long term goal: Reduce overalllevel, frequency, and intensity of the anxiety so that daily functioning is not impaired.  Short term goal: Increase understandingof beliefs and messages that produce anxiety, depression, or "worries".  Strategy: Identify, challenge,and replace anxious/depressive/fearful self-talk with positive, realistic, and empowering  self-talk.  Progress: Patient in today reporting anxiety, some depression, and guilt.  Denies any SI. "My generalized anxiety is some better but some anxiety about health-related issues still persists (dental anxiety partly due to history of dental procedures that have been difficult).  Also anxious about her weight (gained some during pandemic). Some depression related to residual grief from death of fiance, some guilt, and triggered by a movie she watched several nights ago, but is gradually "slowly" moving forward. Processed more of her grief feelings as she talked through her feelings these past few days. Tearful intermittently. Self-critical with negative self-talk, which we worked on more today as it is affecting her self-esteem and overall progress. Able to step back and see more progress in some ways but also seeing her need areas and motivated to work on them.  Continues to work on anxious/depressive thoughts to interrupt/challenge/replcace them with more positive,reality-based, and empowering thoughts and self-talk that do not support anxiety nor depression.  Goal review and progress noted with patient.  Next appt within 2 weeks.   Mathis Fare, LCSW

## 2020-05-01 ENCOUNTER — Other Ambulatory Visit: Payer: Self-pay

## 2020-05-01 ENCOUNTER — Ambulatory Visit (INDEPENDENT_AMBULATORY_CARE_PROVIDER_SITE_OTHER): Payer: 59 | Admitting: Psychiatry

## 2020-05-01 DIAGNOSIS — F411 Generalized anxiety disorder: Secondary | ICD-10-CM

## 2020-05-01 NOTE — Progress Notes (Signed)
      [facility] Counselor/Therapist Progress Note  Patient ID: Brianna Barr, MRN: [id number],    Date: 05/01/2020  Time Spent: 60 minutes   8:00am to 9:00am  Treatment Type: Individual Therapy  Reported Symptoms: anxiety, grief-related issues, "some jealously that has come up recently"  Mental Status Exam:  Appearance:   Casual     Behavior:  Appropriate, Sharing and Motivated  Motor:  Normal  Speech/Language:   Clear and Coherent  Affect:  anxious  Mood:  anxious  Thought process:  normal  Thought content:    WNL  Sensory/Perceptual disturbances:    WNL  Orientation:  oriented to person, place, time/date, situation, day of week, month of year and year  Attention:  Good  Concentration:  Good and Fair  Memory:  WNL  Fund of knowledge:   Good  Insight:    Good  Judgment:   Good  Impulse Control:  Good   Risk Assessment: Danger to Self:  No Self-injurious Behavior: No Danger to Others: No Duty to Warn:no Physical Aggression / Violence:No  Access to Firearms a concern: No  Gang Involvement:No   Subjective: Patient today reporting anxiety, grief-related issues, some issues of feeling "different in not a good way of not being with someone when others around me have a husband or date with them".    Interventions: Cognitive Behavioral Therapy and Solution-Oriented/Positive Psychology  Diagnosis:   ICD-10-CM   1. Generalized anxiety disorder  F41.1     Plan of Care: Patient not signing tx plan on computer screen due to COVID.   Treatment Goals: Goals will remain on tx plan as patient works with strategies to meet her goals. Progress will be noted each session and documented in "Progress" section of Plan.  Long term goal: Reduce overalllevel, frequency, and intensity of the anxiety so that daily functioning is not impaired.  Short term goal: Increase understandingof beliefs and messages that produce anxiety, depression, or  "worries".  Strategy: Identify, challenge,and replace anxious/depressive/fearful self-talk with positive, realistic, and empowering self-talk.  Progress: Patient in today with anxiety, grief-related issues, and increased sensitivity to her "being alone rather than dating someone or married" feeling different from others, "hyper aware of my singleness". Processed her feelings about this including apprehension, sadness, and realizing how this is impacting her self-esteem. Worked today on self-esteem, negative self-talk, and the anxiety involved in wanting relationship opportunities.  To work more on her anxious thoughts and negative/self-defeating thoughts that cloud her vision of her future. Practiced more positive self-talk in session and she is to work more consistently between sessions to use positive self-talk and work to interrupt and change her anxious thoughts to more positive, reality-based thoughts that do not support anxiety.  In following up from last session, her dental anxiety was not as acute at her recent dental appt.    Goal review and progress noted with patient.  Next appt within 2 weeks.    Mathis Fare, LCSW

## 2020-05-14 ENCOUNTER — Ambulatory Visit (INDEPENDENT_AMBULATORY_CARE_PROVIDER_SITE_OTHER): Payer: 59 | Admitting: Psychiatry

## 2020-05-14 ENCOUNTER — Other Ambulatory Visit: Payer: Self-pay

## 2020-05-14 DIAGNOSIS — F411 Generalized anxiety disorder: Secondary | ICD-10-CM | POA: Diagnosis not present
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
## 2020-05-28 ENCOUNTER — Ambulatory Visit (INDEPENDENT_AMBULATORY_CARE_PROVIDER_SITE_OTHER): Payer: 59 | Admitting: Psychiatry

## 2020-05-28 ENCOUNTER — Other Ambulatory Visit: Payer: Self-pay

## 2020-05-28 DIAGNOSIS — F411 Generalized anxiety disorder: Secondary | ICD-10-CM

## 2020-05-28 NOTE — Progress Notes (Signed)
°      [facility] Counselor/Therapist Progress Note  Patient ID: Brianna Barr, MRN: [id number],    Date: 05/28/2020  Time Spent: 60 minutes  9:00am to 10:00am  Treatment Type: Individual Therapy  Reported Symptoms: anxiety, depression (anxiety is the stronger symptom)  Mental Status Exam:  Appearance:   Casual     Behavior:  Appropriate, Sharing and Motivated  Motor:  Normal  Speech/Language:   Clear and Coherent  Affect:  anxious, depressed  Mood:  anxious and depressed  Thought process:  goal directed  Thought content:    WNL  Sensory/Perceptual disturbances:    WNL  Orientation:  oriented to person, place, time/date, situation, day of week, month of year and year  Attention:  Good  Concentration:  Good  Memory:  WNL  Fund of knowledge:   Good  Insight:    Good  Judgment:   Good  Impulse Control:  Good   Risk Assessment: Danger to Self:  No Self-injurious Behavior: No Danger to Others: No Duty to Warn:no Physical Aggression / Violence:No  Access to Firearms a concern: No  Gang Involvement:No   Subjective: Patient reporting anxiety and depression today, with anxiety being the strongest. " Having difficulty following though on different behaviors that might make me have better outcomes, but my self-esteem gets in my way."  Interventions: Cognitive Behavioral Therapy and Solution-Oriented/Positive Psychology  Diagnosis:   ICD-10-CM   1. Generalized anxiety disorder  F41.1     Plan of Care: Patient not signing tx plan on computer screen due to COVID.   Treatment Goals: Goals will remain on tx plan as patient works with strategies to meet her goals. Progress will be noted each session and documented in "Progress" section of Plan.  Long term goal: Reduce overalllevel, frequency, and intensity of the anxiety so that daily functioning is not impaired.  Short term goal: Increase understandingof beliefs and messages that produce anxiety, depression,  or "worries".  Strategy: Identify, challenge,and replace anxious/depressive/fearful self-talk with positive, realistic, and empowering self-talk.  Progress: Patient in today with anxiety and depression, with anxiety being the stronger symptoms.  Reports it's been difficult for her "to follow up on some goal-directed behavior changes due to my lowered self-esteem and my tendency to have everything all planned out." This is what patient feels she needs to work on today and has really been affecting her meeting new friends, which is a real need for her. Focused on her not feeling everything in a conversation needs to be planned out, as that is how she has felt.  Discussed how best conversations are based on listening as well as talking, so planning ahead is not really a good strategy. We role-played several examples in session today to give patient more of a feel to actually listening in order to best respond. She seemed to feel more confident by session end and plans to follow through in using skills taught today in speaking to more people, especially in situations where she does not know the person. Anxiety reduced and affect some brighter by session end.  Encouraged good physical and emotional self-care, and to keep challenging any anxious/depressive thoughts and replacing them with more positive and reality-based thoughts that do not feel anxiety nor depression.  Goal review and progress/challenges noted with patient.  Next appt within 2 weeks.    Mathis Fare, LCSW

## 2020-06-11 ENCOUNTER — Ambulatory Visit (INDEPENDENT_AMBULATORY_CARE_PROVIDER_SITE_OTHER): Payer: 59 | Admitting: Psychiatry

## 2020-06-11 ENCOUNTER — Other Ambulatory Visit: Payer: Self-pay

## 2020-06-11 DIAGNOSIS — F411 Generalized anxiety disorder: Secondary | ICD-10-CM

## 2020-06-11 NOTE — Progress Notes (Signed)
      [facility] Counselor/Therapist Progress Note  Patient ID: Brianna Barr, MRN: [id number],    Date: 06/11/2020  Time Spent: 60 minutes   8:58am to 9:58am  Treatment Type: Individual Therapy  Reported Symptoms: anxiety, depression, tearfulness, tending to "always look for the negatives" in situations, denies any SI  Mental Status Exam:  Appearance:   Casual     Behavior:  Appropriate, Sharing and Motivated  Motor:  Normal  Speech/Language:   Clear and Coherent  Affect:  anxious, depressed  Mood:  anxious and depressed  Thought process:  goal directed  Thought content:    WNL  Sensory/Perceptual disturbances:    WNL  Orientation:  oriented to person, place, time/date, situation, day of week, month of year and year  Attention:  Good  Concentration:  Fair  Memory:  WNL  Fund of knowledge:   Good  Insight:    Good  Judgment:   Good  Impulse Control:  Good   Risk Assessment: Danger to Self:  No Self-injurious Behavior: No Danger to Others: No Duty to Warn:no Physical Aggression / Violence:No  Access to Firearms a concern: No  Gang Involvement:No   Subjective: Patient today reports anxiety and depression and "feeling like I'm running on empty" at times. "But I am getting enough sleep."  Has been having to work increased hours due to staff being out and increased patient numbers.She feels this contributes to her tiredness.  Interventions: Cognitive Behavioral Therapy, Solution-Oriented/Positive Psychology and Ego-Supportive  Diagnosis:   ICD-10-CM   1. Generalized anxiety disorder  F41.1      Plan of Care: Patient not signing tx plan on computer screen due to COVID.   Treatment Goals: Goals will remain on tx plan as patient works with strategies to meet her goals. Progress will be noted each session and documented in "Progress" section of Plan.  Long term goal: Reduce overalllevel, frequency, and intensity of the anxiety so that daily functioning  is not impaired.  Short term goal: Increase understandingof beliefs and messages that produce anxiety, depression, or "worries".  Strategy: Identify, challenge,and replace anxious/depressive/fearful self-talk with positive, realistic, and empowering self-talk.  Progress: Patient in today with anxiety and depression and demonstrates a tendency to looking at the negatives versus positives, and what may go wrong versus right. Processed her feelings more in depth about her focus on negatives more than positives, and also not able to stay in the present due to focusing on what might go wrong in the future or what's gone wrong in the past. Worked hard today on her anxious/negative/depressive thoughts that have been escalating more recently, tearfully acknowledges some self-sabotaging in her thinking and outlook re: herself. She is to work more with her short term goal and strategy above in better understanding long held beliefs that are contributing to her anxious/depressive;negative thoughts and working to challenge those beliefs more as she changes them to reflect more positive/encouraging/empowering thoughts and self-talk that do not support anxiety nor depression. Looking at unhealthy ways she has defined herself and how to change this in healthy ways.  To work on these strategies between sessions.   Goal review and progress/challenges noted with patient.  Next appt within 2-3 weeks.   Mathis Fare, LCSW

## 2020-06-25 ENCOUNTER — Encounter: Payer: Self-pay | Admitting: Family Medicine

## 2020-06-25 ENCOUNTER — Other Ambulatory Visit: Payer: Self-pay

## 2020-06-25 ENCOUNTER — Ambulatory Visit (INDEPENDENT_AMBULATORY_CARE_PROVIDER_SITE_OTHER): Payer: 59 | Admitting: Psychiatry

## 2020-06-25 ENCOUNTER — Ambulatory Visit (INDEPENDENT_AMBULATORY_CARE_PROVIDER_SITE_OTHER): Payer: Commercial Managed Care - PPO | Admitting: Family Medicine

## 2020-06-25 VITALS — BP 112/84 | HR 99 | Temp 98.9°F | Wt 223.0 lb

## 2020-06-25 DIAGNOSIS — M722 Plantar fascial fibromatosis: Secondary | ICD-10-CM

## 2020-06-25 DIAGNOSIS — M2550 Pain in unspecified joint: Secondary | ICD-10-CM

## 2020-06-25 DIAGNOSIS — K582 Mixed irritable bowel syndrome: Secondary | ICD-10-CM | POA: Diagnosis not present

## 2020-06-25 DIAGNOSIS — M791 Myalgia, unspecified site: Secondary | ICD-10-CM | POA: Diagnosis not present

## 2020-06-25 DIAGNOSIS — F419 Anxiety disorder, unspecified: Secondary | ICD-10-CM

## 2020-06-25 DIAGNOSIS — F411 Generalized anxiety disorder: Secondary | ICD-10-CM | POA: Diagnosis not present

## 2020-06-25 DIAGNOSIS — F321 Major depressive disorder, single episode, moderate: Secondary | ICD-10-CM

## 2020-06-25 MED ORDER — PREDNISONE 10 MG PO TABS: ORAL_TABLET | ORAL | 0 refills | Status: DC

## 2020-06-25 MED ORDER — CYCLOBENZAPRINE HCL 5 MG PO TABS: 5.0000 mg | ORAL_TABLET | Freq: Every day | ORAL | 0 refills | Status: DC

## 2020-06-25 NOTE — Patient Instructions (Addendum)
Muscle Pain, Adult Muscle pain (myalgia) may be mild or severe. In most cases, the pain lasts only a short time and it goes away without treatment. It is normal to feel some muscle pain after starting a workout program. Muscles that have not been used often will be sore at first. Muscle pain may also be caused by many other things, including:  Overuse or muscle strain, especially if you are not in shape. This is the most common cause of muscle pain.  Injury.  Bruises.  Viruses, such as the flu.  Infectious diseases.  A chronic condition that causes muscle tenderness, fatigue, and headache (fibromyalgia).  A condition, such as lupus, in which the body's disease-fighting system attacks other organs in the body (autoimmune or rheumatologic diseases).  Certain drugs, including ACE inhibitors and statins. To diagnose the cause of your muscle pain, your health care provider will do a physical exam and ask questions about the pain and when it began. If you have not had muscle pain for very long, your health care provider may want to wait before doing much testing. If your muscle pain has lasted a long time, your health care provider may want to run tests right away. In some cases, this may include tests to rule out certain conditions or illnesses. Treatment for muscle pain depends on the cause. Home care is often enough to relieve muscle pain. Your health care provider may also prescribe anti-inflammatory medicine. Follow these instructions at home: Activity  If overuse is causing your muscle pain: ? Slow down your activities until the pain goes away. ? Do regular, gentle exercises if you are not usually active. ? Warm up before exercising. Stretch before and after exercising. This can help lower the risk of muscle pain.  Do not continue working out if the pain is very bad. Bad pain could mean that you have injured a muscle. Managing pain and discomfort   If directed, apply ice to the  sore muscle: ? Put ice in a plastic bag. ? Place a towel between your skin and the bag. ? Leave the ice on for 20 minutes, 2-3 times a day.  You may also alternate between applying ice and applying heat as told by your health care provider. To apply heat, use the heat source that your health care provider recommends, such as a moist heat pack or a heating pad. ? Place a towel between your skin and the heat source. ? Leave the heat on for 20-30 minutes. ? Remove the heat if your skin turns bright red. This is especially important if you are unable to feel pain, heat, or cold. You may have a greater risk of getting burned. Medicines  Take over-the-counter and prescription medicines only as told by your health care provider.  Do not drive or use heavy machinery while taking prescription pain medicine. Contact a health care provider if:  Your muscle pain gets worse and medicines do not help.  You have muscle pain that lasts longer than 3 days.  You have a rash or fever along with muscle pain.  You have muscle pain after a tick bite.  You have muscle pain while working out, even though you are in good physical condition.  You have redness, soreness, or swelling along with muscle pain.  You have muscle pain after starting a new medicine or changing the dose of a medicine. Get help right away if:  You have trouble breathing.  You have trouble swallowing.  You have muscle  pain along with a stiff neck, fever, and vomiting.  You have severe muscle weakness or cannot move part of your body. This information is not intended to replace advice given to you by your health care provider. Make sure you discuss any questions you have with your health care provider. Document Revised: 09/25/2017 Document Reviewed: 03/04/2016 Elsevier Patient Education  2020 [employer]  Plantar Fasciitis  Plantar fasciitis is a painful foot condition that affects the heel. It occurs when the band of tissue  that connects the toes to the heel bone (plantar fascia) becomes irritated. This can happen as the result of exercising too much or doing other repetitive activities (overuse injury). The pain from plantar fasciitis can range from mild irritation to severe pain that makes it difficult to walk or move. The pain is usually worse in the morning after sleeping, or after sitting or lying down for a while. Pain may also be worse after long periods of walking or standing. What are the causes? This condition may be caused by:  Standing for long periods of time.  Wearing shoes that do not have good arch support.  Doing activities that put stress on joints (high-impact activities), including running, aerobics, and ballet.  Being overweight.  An abnormal way of walking (gait).  Tight muscles in the back of your lower leg (calf).  High arches in your feet.  Starting a new athletic activity. What are the signs or symptoms? The main symptom of this condition is heel pain. Pain may:  Be worse with first steps after a time of rest, especially in the morning after sleeping or after you have been sitting or lying down for a while.  Be worse after long periods of standing still.  Decrease after 30-45 minutes of activity, such as gentle walking. How is this diagnosed? This condition may be diagnosed based on your medical history and your symptoms. Your health care provider may ask questions about your activity level. Your health care provider will do a physical exam to check for:  A tender area on the bottom of your foot.  A high arch in your foot.  Pain when you move your foot.  Difficulty moving your foot. You may have imaging tests to confirm the diagnosis, such as:  X-rays.  Ultrasound.  MRI. How is this treated? Treatment for plantar fasciitis depends on how severe your condition is. Treatment may include:  Rest, ice, applying pressure (compression), and raising the affected foot  (elevation). This may be called RICE therapy. Your health care provider may recommend RICE therapy along with over-the-counter pain medicines to manage your pain.  Exercises to stretch your calves and your plantar fascia.  A splint that holds your foot in a stretched, upward position while you sleep (night splint).  Physical therapy to relieve symptoms and prevent problems in the future.  Injections of steroid medicine (cortisone) to relieve pain and inflammation.  Stimulating your plantar fascia with electrical impulses (extracorporeal shock wave therapy). This is usually the last treatment option before surgery.  Surgery, if other treatments have not worked after 12 months. Follow these instructions at home:  Managing pain, stiffness, and swelling  If directed, put ice on the painful area: ? Put ice in a plastic bag, or use a frozen bottle of water. ? Place a towel between your skin and the bag or bottle. ? Roll the bottom of your foot over the bag or bottle. ? Do this for 20 minutes, 2-3 times a day.  Wear athletic shoes that have air-sole or gel-sole cushions, or try wearing soft shoe inserts that are designed for plantar fasciitis.  Raise (elevate) your foot above the level of your heart while you are sitting or lying down. Activity  Avoid activities that cause pain. Ask your health care provider what activities are safe for you.  Do physical therapy exercises and stretches as told by your health care provider.  Try activities and forms of exercise that are easier on your joints (low-impact). Examples include swimming, water aerobics, and biking. General instructions  Take over-the-counter and prescription medicines only as told by your health care provider.  Wear a night splint while sleeping, if told by your health care provider. Loosen the splint if your toes tingle, become numb, or turn cold and blue.  Maintain a healthy weight, or work with your health care provider  to lose weight as needed.  Keep all follow-up visits as told by your health care provider. This is important. Contact a health care provider if you:  Have symptoms that do not go away after caring for yourself at home.  Have pain that gets worse.  Have pain that affects your ability to move or do your daily activities. Summary  Plantar fasciitis is a painful foot condition that affects the heel. It occurs when the band of tissue that connects the toes to the heel bone (plantar fascia) becomes irritated.  The main symptom of this condition is heel pain that may be worse after exercising too much or standing still for a long time.  Treatment varies, but it usually starts with rest, ice, compression, and elevation (RICE therapy) and over-the-counter medicines to manage pain. This information is not intended to replace advice given to you by your health care provider. Make sure you discuss any questions you have with your health care provider. Document Revised: 09/25/2017 Document Reviewed: 08/10/2017 Elsevier Patient Education  2020 [employer]  Plantar Fasciitis Rehab Ask your health care provider which exercises are safe for you. Do exercises exactly as told by your health care provider and adjust them as directed. It is normal to feel mild stretching, pulling, tightness, or discomfort as you do these exercises. Stop right away if you feel sudden pain or your pain gets worse. Do not begin these exercises until told by your health care provider. Stretching and range-of-motion exercises These exercises warm up your muscles and joints and improve the movement and flexibility of your foot. These exercises also help to relieve pain. Plantar fascia stretch  1. Sit with your left / right leg crossed over your opposite knee. 2. Hold your heel with one hand with that thumb near your arch. With your other hand, hold your toes and gently pull them back toward the top of your foot. You should feel  a stretch on the bottom of your toes or your foot (plantar fascia) or both. 3. Hold this stretch for__________ seconds. 4. Slowly release your toes and return to the starting position. Repeat __________ times. Complete this exercise __________ times a day. Gastrocnemius stretch, standing This exercise is also called a calf (gastroc) stretch. It stretches the muscles in the back of the upper calf. 1. Stand with your hands against a wall. 2. Extend your left / right leg behind you, and bend your front knee slightly. 3. Keeping your heels on the floor and your back knee straight, shift your weight toward the wall. Do not arch your back. You should feel a gentle stretch in your  upper left / right calf. 4. Hold this position for __________ seconds. Repeat __________ times. Complete this exercise __________ times a day. Soleus stretch, standing This exercise is also called a calf (soleus) stretch. It stretches the muscles in the back of the lower calf. 1. Stand with your hands against a wall. 2. Extend your left / right leg behind you, and bend your front knee slightly. 3. Keeping your heels on the floor, bend your back knee and shift your weight slightly over your back leg. You should feel a gentle stretch deep in your lower calf. 4. Hold this position for __________ seconds. Repeat __________ times. Complete this exercise __________ times a day. Gastroc and soleus stretch, standing step This exercise stretches the muscles in the back of the lower leg. These muscles are in the upper calf (gastrocnemius) and the lower calf (soleus). 1. Stand with the ball of your left / right foot on a step. The ball of your foot is on the walking surface, right under your toes. 2. Keep your other foot firmly on the same step. 3. Hold on to the wall or a railing for balance. 4. Slowly lift your other foot, allowing your body weight to press your left / right heel down over the edge of the step. You should feel a  stretch in your left / right calf. 5. Hold this position for __________ seconds. 6. Return both feet to the step. 7. Repeat this exercise with a slight bend in your left / right knee. Repeat __________ times with your left / right knee straight and __________ times with your left / right knee bent. Complete this exercise __________ times a day. Balance exercise This exercise builds your balance and strength control of your arch to help take pressure off your plantar fascia. Single leg stand If this exercise is too easy, you can try it with your eyes closed or while standing on a pillow. 1. Without shoes, stand near a railing or in a doorway. You may hold on to the railing or door frame as needed. 2. Stand on your left / right foot. Keep your big toe down on the floor and try to keep your arch lifted. Do not let your foot roll inward. 3. Hold this position for __________ seconds. Repeat __________ times. Complete this exercise __________ times a day. This information is not intended to replace advice given to you by your health care provider. Make sure you discuss any questions you have with your health care provider. Document Revised: 02/03/2019 Document Reviewed: 08/11/2018 Elsevier Patient Education  2020 [employer]  Low-FODMAP Eating Plan  FODMAPs (fermentable oligosaccharides, disaccharides, monosaccharides, and polyols) are sugars that are hard for some people to digest. A low-FODMAP eating plan may help some people who have bowel (intestinal) diseases to manage their symptoms. This meal plan can be complicated to follow. Work with a diet and nutrition specialist (dietitian) to make a low-FODMAP eating plan that is right for you. A dietitian can make sure that you get enough nutrition from this diet. What are tips for following this plan? Reading food labels  Check labels for hidden FODMAPs such as: ? High-fructose syrup. ? Honey. ? Agave. ? Natural fruit flavors. ? Onion or  garlic powder.  Choose low-FODMAP foods that contain 3-4 grams of fiber per serving.  Check food labels for serving sizes. Eat only one serving at a time to make sure FODMAP levels stay low. Meal planning  Follow a low-FODMAP eating plan for up  to 6 weeks, or as told by your health care provider or dietitian.  To follow the eating plan: 1. Eliminate high-FODMAP foods from your diet completely. 2. Gradually reintroduce high-FODMAP foods into your diet one at a time. Most people should wait a few days after introducing one high-FODMAP food before they introduce the next high-FODMAP food. Your dietitian can recommend how quickly you may reintroduce foods. 3. Keep a daily record of what you eat and drink, and make note of any symptoms that you have after eating. 4. Review your daily record with a dietitian regularly. Your dietitian can help you identify which foods you can eat and which foods you should avoid. General tips  Drink enough fluid each day to keep your urine pale yellow.  Avoid processed foods. These often have added sugar and may be high in FODMAPs.  Avoid most dairy products, whole grains, and sweeteners.  Work with a dietitian to make sure you get enough fiber in your diet. Recommended foods Grains  Gluten-free grains, such as rice, oats, buckwheat, quinoa, corn, polenta, and millet. Gluten-free pasta, bread, or cereal. Rice noodles. Corn tortillas. Vegetables  Eggplant, zucchini, cucumber, peppers, green beans, Brussels sprouts, bean sprouts, lettuce, arugula, kale, Swiss chard, spinach, collard greens, bok choy, summer squash, potato, and tomato. Limited amounts of corn, carrot, and sweet potato. Green parts of scallions. Fruits  Bananas, oranges, lemons, limes, blueberries, raspberries, strawberries, grapes, cantaloupe, honeydew melon, kiwi, papaya, passion fruit, and pineapple. Limited amounts of dried cranberries, banana chips, and shredded  coconut. Dairy  Lactose-free milk, yogurt, and kefir. Lactose-free cottage cheese and ice cream. Non-dairy milks, such as almond, coconut, hemp, and rice milk. Yogurts made of non-dairy milks. Limited amounts of goat cheese, brie, mozzarella, parmesan, swiss, and other hard cheeses. Meats and other protein foods  Unseasoned beef, pork, poultry, or fish. Eggs. Tomasa Blase. Tofu (firm) and tempeh. Limited amounts of nuts and seeds, such as almonds, walnuts, [place] nuts, pecans, peanuts, pumpkin seeds, chia seeds, and sunflower seeds. Fats and oils  Butter-free spreads. Vegetable oils, such as olive, canola, and sunflower oil. Seasoning and other foods  Artificial sweeteners with names that do not end in "ol" such as aspartame, saccharine, and stevia. Maple syrup, white table sugar, raw sugar, brown sugar, and molasses. Fresh basil, coriander, parsley, rosemary, and thyme. Beverages  Water and mineral water. Sugar-sweetened soft drinks. Small amounts of orange juice or cranberry juice. Black and green tea. Most dry wines. Coffee. This may not be a complete list of low-FODMAP foods. Talk with your dietitian for more information. Foods to avoid Grains  Wheat, including kamut, durum, and semolina. Barley and bulgur. Couscous. Wheat-based cereals. Wheat noodles, bread, crackers, and pastries. Vegetables  Chicory root, artichoke, asparagus, cabbage, snow peas, sugar snap peas, mushrooms, and cauliflower. Onions, garlic, leeks, and the white part of scallions. Fruits  Fresh, dried, and juiced forms of apple, pear, watermelon, peach, plum, cherries, apricots, blackberries, boysenberries, figs, nectarines, and mango. Avocado. Dairy  Milk, yogurt, ice cream, and soft cheese. Cream and sour cream. Milk-based sauces. Custard. Meats and other protein foods  Fried or fatty meat. Sausage. Cashews and pistachios. Soybeans, baked beans, black beans, chickpeas, kidney beans, fava beans, navy beans, lentils,  and split peas. Seasoning and other foods  Any sugar-free gum or candy. Foods that contain artificial sweeteners such as sorbitol, mannitol, isomalt, or xylitol. Foods that contain honey, high-fructose corn syrup, or agave. Bouillon, vegetable stock, beef stock, and chicken stock. Garlic and onion powder. Condiments made  with onion, such as hummus, chutney, pickles, relish, salad dressing, and salsa. Tomato paste. Beverages  Chicory-based drinks. Coffee substitutes. Chamomile tea. Fennel tea. Sweet or fortified wines such as port or sherry. Diet soft drinks made with isomalt, mannitol, maltitol, sorbitol, or xylitol. Apple, pear, and mango juice. Juices with high-fructose corn syrup. This may not be a complete list of high-FODMAP foods. Talk with your dietitian to discuss what dietary choices are best for you.  Summary  A low-FODMAP eating plan is a short-term diet that eliminates FODMAPs from your diet to help ease symptoms of certain bowel diseases.  The eating plan usually lasts up to 6 weeks. After that, high-FODMAP foods are restarted gradually, one at a time, so you can find out which may be causing symptoms.  A low-FODMAP eating plan can be complicated. It is best to work with a dietitian who has experience with this type of plan. This information is not intended to replace advice given to you by your health care provider. Make sure you discuss any questions you have with your health care provider. Document Revised: 09/25/2017 Document Reviewed: 06/09/2017 Elsevier Patient Education  2020 [employer]  Irritable Bowel Syndrome, Adult  Irritable bowel syndrome (IBS) is a group of symptoms that affects the organs responsible for digestion (gastrointestinal or GI tract). IBS is not one specific disease. To regulate how the GI tract works, the body sends signals back and forth between the intestines and the brain. If you have IBS, there may be a problem with these signals. As a result, the  GI tract does not function normally. The intestines may become more sensitive and overreact to certain things. This may be especially true when you eat certain foods or when you are under stress. There are four types of IBS. These may be determined based on the consistency of your stool (feces):  IBS with diarrhea.  IBS with constipation.  Mixed IBS.  Unsubtyped IBS. It is important to know which type of IBS you have. Certain treatments are more likely to be helpful for certain types of IBS. What are the causes? The exact cause of IBS is not known. What increases the risk? You may have a higher risk for IBS if you:  Are female.  Are younger than 22.  Have a family history of IBS.  Have a mental health condition, such as depression, anxiety, or post-traumatic stress disorder.  Have had a bacterial infection of your GI tract. What are the signs or symptoms? Symptoms of IBS vary from person to person. The main symptom is abdominal pain or discomfort. Other symptoms usually include one or more of the following:  Diarrhea, constipation, or both.  Abdominal swelling or bloating.  Feeling full after eating a small or regular-sized meal.  Frequent gas.  Mucus in the stool.  A feeling of having more stool left after a bowel movement. Symptoms tend to come and go. They may be triggered by stress, mental health conditions, or certain foods. How is this diagnosed? This condition may be diagnosed based on a physical exam, your medical history, and your symptoms. You may have tests, such as:  Blood tests.  Stool test.  X-rays.  CT scan.  Colonoscopy. This is a procedure in which your GI tract is viewed with a long, thin, flexible tube. How is this treated? There is no cure for IBS, but treatment can help relieve symptoms. Treatment depends on the type of IBS you have, and may include:  Changes to your  diet, such as: ? Avoiding foods that cause symptoms. ? Drinking more  water. ? Following a low-FODMAP (fermentable oligosaccharides, disaccharides, monosaccharides, and polyols) diet for up to 6 weeks, or as told by your health care provider. FODMAPs are sugars that are hard for some people to digest. ? Eating more fiber. ? Eating medium-sized meals at the same times every day.  Medicines. These may include: ? Fiber supplements, if you have constipation. ? Medicine to control diarrhea (antidiarrheal medicines). ? Medicine to help control muscle tightening (spasms) in your GI tract (antispasmodic medicines). ? Medicines to help with mental health conditions, such as antidepressants or tranquilizers.  Talk therapy or counseling.  Working with a diet and nutrition specialist (dietitian) to help create a food plan that is right for you.  Managing your stress. Follow these instructions at home: Eating and drinking  Eat a healthy diet.  Eat medium-sized meals at about the same time every day. Do not eat large meals.  Gradually eat more fiber-rich foods. These include whole grains, fruits, and vegetables. This may be especially helpful if you have IBS with constipation.  Eat a diet low in FODMAPs.  Drink enough fluid to keep your urine pale yellow.  Keep a journal of foods that seem to trigger symptoms.  Avoid foods and drinks that: ? Contain added sugar. ? Make your symptoms worse. Dairy products, caffeinated drinks, and carbonated drinks can make symptoms worse for some people. General instructions  Take over-the-counter and prescription medicines and supplements only as told by your health care provider.  Get enough exercise. Do at least 150 minutes of moderate-intensity exercise each week.  Manage your stress. Getting enough sleep and exercise can help you manage stress.  Keep all follow-up visits as told by your health care provider and therapist. This is important. Alcohol Use  Do not drink alcohol if: ? Your health care provider tells you  not to drink. ? You are pregnant, may be pregnant, or are planning to become pregnant.  If you drink alcohol, limit how much you have: ? 0-1 drink a day for women. ? 0-2 drinks a day for men.  Be aware of how much alcohol is in your drink. In the [place] one drink equals one typical bottle of beer (12 oz), one-half glass of wine (5 oz), or one shot of hard liquor (1 oz). Contact a health care provider if you have:  Constant pain.  Weight loss.  Difficulty or pain when swallowing.  Diarrhea that gets worse. Get help right away if you have:  Severe abdominal pain.  Fever.  Diarrhea with symptoms of dehydration, such as dizziness or dry mouth.  Bright red blood in your stool.  Stool that is black and tarry.  Abdominal swelling.  Vomiting that does not stop.  Blood in your vomit. Summary  Irritable bowel syndrome (IBS) is not one specific disease. It is a group of symptoms that affects digestion.  Your intestines may become more sensitive and overreact to certain things. This may be especially true when you eat certain foods or when you are under stress.  There is no cure for IBS, but treatment can help relieve symptoms. This information is not intended to replace advice given to you by your health care provider. Make sure you discuss any questions you have with your health care provider. Document Revised: 10/06/2017 Document Reviewed: 10/06/2017 Elsevier Patient Education  2020 [employer].

## 2020-06-25 NOTE — Progress Notes (Signed)
Subjective:    Patient ID: Brianna Barr, female    DOB: [birth year], 24 y.o.   MRN: [id number]  No chief complaint on file.   HPI Patient was seen today for ongoing concern.  Patient endorses myalgias, pain in knees, low back, shoulders, neck, plantar fasciitis x 8-10 months.  Pt endorses muscle stiffness and achiness all the time.  Wakes up with the sensation.  Nothing makes better.  Aacivity makes worse.  Pt denies joint swelling.  Taking Mobic and Tylenol up to 3000 mg daily.   Seen by Dr. Neomia Glass, podiatry for plantar fasciitis.  S/p steroid injections x3 rounds, using ice, night splints, orthotics, exercises, splints during the day, and with light therapy for the last 3 weeks.  Pt also seen by chiropractor for the last 18 months.  Endorses recent x-rays of back.  Was advised 1 hip higher than the other.  Pt's mom has a history of MS and arthritis in her dad has a history of arthritis.  Patient endorses increased anxiety and stress.  Pt's mom and dad need TKR.  Patient was taking Wellbutrin but stopped medication after forgetting to take it.  Patient also in counseling.  Had counseling session yesterday.  Pt was being seen by psychiatrist at [facility].  Pt notes bouts of constipation/diarrhea x yrs. at times worse when anxious.  Past Medical History:  Diagnosis Date   Anxiety    Asthma    Depression     No Known Allergies  ROS General: Denies fever, chills, night sweats, changes in weight, changes in appetite HEENT: Denies headaches, ear pain, changes in vision, rhinorrhea, sore throat CV: Denies CP, palpitations, SOB, orthopnea Pulm: Denies SOB, cough, wheezing GI: Denies abdominal pain, nausea, vomiting  + constipation and diarrhea GU: Denies dysuria, hematuria, frequency, vaginal discharge Msk: Denies muscle cramps + bilateral foot pain, knee pain, low back pain, neck pain, myalgias and stiffness.  Plantar fasciitis Neuro: Denies weakness, numbness, tingling Skin: Denies  rashes, bruising Psych: Denies hallucinations  + anxiety, depression     Objective:    Blood pressure 112/84, pulse 99, temperature 98.9 F (37.2 C), temperature source Oral, weight 223 lb (101.2 kg), SpO2 98 %.   Gen. Pleasant, well-nourished, in no distress, normal affect  HEENT: [place]/AT, face symmetric, conjunctiva clear, no scleral icterus, PERRLA, EOMI, nares patent without drainage Lungs: no accessory muscle use Cardiovascular: RRR, no peripheral edema Musculoskeletal: TTP of lower cervical spine, lumbar spine, IT band bilaterally, and left lumbar paraspinal muscles.  Negative straight leg raise, logroll, FADIR and FABER.  no deformities, no cyanosis or clubbing, normal tone Neuro:  A&Ox3, CN II-XII intact.  Patient walking on toes to avoid heels hitting ground.  Left hip slightly higher than right hip.  With ambulation Skin:  Warm, no lesions/ rash   Wt Readings from Last 3 Encounters:  06/25/20 223 lb (101.2 kg)  11/03/19 228 lb (103.4 kg)  09/29/19 232 lb (105.2 kg)    Lab Results  Component Value Date   WBC 8.5 08/18/2019   HGB 12.7 08/18/2019   HCT 38.6 08/18/2019   PLT 411.0 (H) 08/18/2019   GLUCOSE 84 08/18/2019   NA 140 08/18/2019   K 4.0 08/18/2019   CL 106 08/18/2019   CREATININE 0.86 08/18/2019   BUN 11 08/18/2019   CO2 24 08/18/2019   TSH 2.38 08/18/2019   HGBA1C 5.4 08/18/2019    Assessment/Plan:  Multiple joint pain  -Discussed possible causes including current adjustment in walk to decrease pain  caused by plantar fasciitis -Discussed supportive care -Discussed prednisone taper. -For continued symptoms consider autoimmune screen -We'll try to obtain recent imaging from chiropractor.  Consider repeating imaging if needed - Plan: predniSONE (DELTASONE) 10 MG tablet  Irritable bowel syndrome with both constipation and diarrhea -Discussed low FODMAP diet -Given handout -Consider food diary  Myalgia -Also with IT band syndrome  bilaterally -Discussed ways to reduce inflammation including turmeric -Continue supportive care -Prednisone taper trial of Flexeril nightly -Continue stretching - Plan: cyclobenzaprine (FLEXERIL) 5 MG tablet, predniSONE (DELTASONE) 10 MG tablet  Plantar fasciitis -Continue current therapies -Follow-up with podiatry - Plan: predniSONE (DELTASONE) 10 MG tablet  Anxiety -GAD-7 score 18 -Discussed ways to reduce stress -Continue counseling -Consider following up with psychiatry/restarting meds for continued or worsening symptoms.  Patient considering.  Depression, major, single episode, moderate (HCC) -PHQ-9 score 16 -No longer taking Wellbutrin -Continue counseling -Consider follow-up with psychiatry and restarting meds. -We'll continue to monitor  F/u 2-4 weeks as needed  Abbe Amsterdam, MD

## 2020-06-25 NOTE — Progress Notes (Signed)
      [facility] Counselor/Therapist Progress Note  Patient ID: Brianna Barr, MRN: [id number],    Date: 06/25/2020  Time Spent: 60 minutes   9:00am to 10:0am   Treatment Type: Individual Therapy  Reported Symptoms: anxiety, depression  Mental Status Exam:  Appearance:   Casual     Behavior:  Appropriate, Sharing and Motivated  Motor:  Normal  Speech/Language:   Clear and Coherent  Affect:  anxiety, depression  Mood:  anxious and depressed  Thought process:  normal  Thought content:    WNL  Sensory/Perceptual disturbances:    WNL  Orientation:  oriented to person, place, time/date, situation, day of week, month of year and year  Attention:  Fair  Concentration:  Fair  Memory:  WNL  Fund of knowledge:   Good  Insight:    Good and Fair  Judgment:   Good and Fair  Impulse Control:  Good   Risk Assessment: Danger to Self:  No Self-injurious Behavior: No Danger to Others: No Duty to Warn:no Physical Aggression / Violence:No  Access to Firearms a concern: No  Gang Involvement:No   Subjective: Patient reports symptoms of anxiety and depression today with anxiety being the strongest.  States I was anxious prior to trip with friends and have also been feeling frustrated.  Some health anxiety concerns with both of her feet (plantar fascitis), gastro issues.. Issues also with her best friends recently that has contributed to her depression and anxiety.    Interventions: Cognitive Behavioral Therapy and Solution-Oriented/Positive Psychology  Diagnosis:   ICD-10-CM   1. Generalized anxiety disorder  F41.1     Plan of Care: Patient not signing tx plan on computer screen due to COVID.   Treatment Goals: Goals will remain on tx plan as patient works with strategies to meet her goals. Progress will be noted each session and documented in "Progress" section of Plan.  Long term goal: Reduce overalllevel, frequency, and intensity of the anxiety so that daily  functioning is not impaired.  Short term goal: Increase understandingof beliefs and messages that produce anxiety, depression, or "worries".  Strategy: Identify, challenge,and replace anxious/depressive/fearful self-talk with positive, realistic, and empowering self-talk.  Progress: Patient today reports anxiety, depression, and some problems with her best friends  including frustrations and misunderstandings and communication issues. She and best friends have taken some time to share and work on their recent differences and concerns. Shared that she and friends did work through some of their issues but need to talk more.  Processed the issues more today in session.  Still looking more for "the negatives" versus "the positives". Discussed with her the negative results in dwelling on the negatives versus the more positive results that can occur when she focused more on the positives, per short and long term goal. She is to work on this more between sessions, along with staying in the present more versus the past or jumping ahead to future which creates more worry for herself. Also to work with her Strategy in her goal plan related to replacing her anxious/depressive self talk with more positive realistic and empowering self talk.  Goal review and progress/challenges noted with patient.  Next appt wiithing 2-3 weeks.   Mathis Fare, LCSW

## 2020-07-11 ENCOUNTER — Ambulatory Visit (INDEPENDENT_AMBULATORY_CARE_PROVIDER_SITE_OTHER): Payer: 59 | Admitting: Psychiatry

## 2020-07-11 ENCOUNTER — Other Ambulatory Visit: Payer: Self-pay

## 2020-07-11 DIAGNOSIS — F411 Generalized anxiety disorder: Secondary | ICD-10-CM | POA: Diagnosis not present

## 2020-07-11 NOTE — Progress Notes (Signed)
      [facility] Counselor/Therapist Progress Note  Patient ID: Brianna Barr, MRN: [id number],    Date: 07/11/2020  Time Spent: 60 minutes   9:00am to 10:00am  Treatment Type: Individual Therapy  Reported Symptoms: anxiety, depression, frustration, "dealing with physical pain in feet particularly"  Mental Status Exam:  Appearance:   Well Groomed     Behavior:  Appropriate and Sharing  Motor:  Normal  Speech/Language:   Normal Rate  Affect:  depression, anxiety  Mood:  anxious and depressed  Thought process:  goal directed  Thought content:    WNL  Sensory/Perceptual disturbances:    WNL  Orientation:  oriented to person, place, time/date, situation, day of week, month of year and year  Attention:  Good  Concentration:  Good and Fair  Memory:  WNL  Fund of knowledge:   Good  Insight:    Good  Judgment:   Good  Impulse Control:  Good   Risk Assessment: Danger to Self:  No Self-injurious Behavior: No Danger to Others: No Duty to Warn:no Physical Aggression / Violence:No  Access to Firearms a concern: No  Gang Involvement:No   Subjective: Patient reports today anxiety and depression with anxiety being some stronger.  Also dealing and being treated for physical pain.   Interventions: Cognitive Behavioral Therapy, Solution-Oriented/Positive Psychology and Ego-Supportive  Diagnosis:   ICD-10-CM   1. Generalized anxiety disorder  F41.1      Plan of Care: Patient not signing tx plan on computer screen due to COVID.   Treatment Goals: Goals will remain on tx plan as patient works with strategies to meet her goals. Progress will be noted each session and documented in "Progress" section of Plan.  Long term goal: Reduce overalllevel, frequency, and intensity of the anxiety so that daily functioning is not impaired.  Short term goal: Increase understandingof beliefs and messages that produce anxiety, depression, or "worries".  Strategy: Identify,  challenge,and replace anxious/depressive/fearful self-talk with positive, realistic, and empowering self-talk.  Progress: Patient in today and reports anxiety and depression, with anxiety being stronger feeling. Continues to have physical pain and being treated for it, especially in her feet. Difficulty feeling motivated. Having some issues with long-time friends which is hurtful in some ways "but probably needed to happen." Discussed these issues in more detail and patient was able to see how she might relate in some different  ways that would her to be open, honest, and also looking for the positives versus the negatives "which affects all areas of my life." Also discussed it being "flu-shot season" which "takes me back to my boyfriend and his illness after flu shot and his eventual death. Per tx goals, we worked on strategies to help her acknowledge some of her progress regarding "the past" including being patient with herself when things from past do occasionally emerge and reminding herself of progress she has made in certain areas, and use of positive, self-affirming self-talk.  Also between sessions, to use tx goal strategy above re: identifying and replacing anxious thoughts with more realistic, positive, and empowering thoughts.  Goal review and progress/challenges noted with patient.  Next appt within 3 weeks.   Mathis Fare, LCSW

## 2020-07-26 ENCOUNTER — Ambulatory Visit (INDEPENDENT_AMBULATORY_CARE_PROVIDER_SITE_OTHER): Payer: 59 | Admitting: Psychiatry

## 2020-07-26 ENCOUNTER — Other Ambulatory Visit: Payer: Self-pay

## 2020-07-26 DIAGNOSIS — F411 Generalized anxiety disorder: Secondary | ICD-10-CM

## 2020-07-26 NOTE — Progress Notes (Signed)
[facility] Counselor/Therapist Progress Note  Patient ID: Brianna Barr, MRN: [id number],    Date: 07/26/2020  Time Spent: 60 minutes  8:58am to 9:58am  Treatment Type: Individual Therapy  Reported Symptoms: anxiety, some depression, "don't feel lots better, but definitely don't feel worse"; some progress in treatments for her feet with "steroid pills instead of injections"  Mental Status Exam:  Appearance:   Casual     Behavior:  Appropriate and Sharing  Motor:  Normal  Speech/Language:   Clear and Coherent  Affect:  anxious, some depression  Mood:  anxious and "some depression"  Thought process:  goal directed  Thought content:    WNL  Sensory/Perceptual disturbances:    WNL  Orientation:  oriented to person, place, time/date, situation, day of week, month of year and year  Attention:  Good  Concentration:  Good and Fair  Memory:  WNL  Fund of knowledge:   Good  Insight:    Good and Fair  Judgment:   Good and Fair  Impulse Control:  Good   Risk Assessment: Danger to Self:  No Self-injurious Behavior: No Danger to Others: No Duty to Warn:no Physical Aggression / Violence:No  Access to Firearms a concern: No  Gang Involvement:No    Subjective: Patient today reports anxiety and some depression.  Feeling some encouraged with the treatments on her feet, but other physical issues still a problem.    Interventions: Cognitive Behavioral Therapy and Solution-Oriented/Positive Psychology  Diagnosis:   ICD-10-CM   1. Generalized anxiety disorder  F41.1      Plan of Care: Patient not signing tx plan on computer screen due to COVID.   Treatment Goals: Goals will remain on tx plan as patient works with strategies to meet her goals. Progress will be noted each session and documented in "Progress" section of Plan.  Long term goal: Reduce overalllevel, frequency, and intensity of the anxiety so that daily functioning is not impaired.  Short term  goal: Increase understandingof beliefs and messages that produce anxiety, depression, or "worries".  Strategy: Identify, challenge,and replace anxious/depressive/fearful self-talk with positive, realistic, and empowering self-talk.  Progress: Patient in today reporting anxiety and some depression. Feel like I'm lacking energy to change. Helping mom a lot after her recent knee surgery, and it can get overwhelming but hard to "always say yes to everything."  Some increased motivation. Still some issues with 3 close friends but that is not as acute right now and patient needing to process some other situations at home. Wanting to change some of her surroundings at home in order to feel she has some personal space that she feels good about. She related how this does impact her emotional health.  Came up with some specific goals about making some changes at home that she knows has been impacting her negatively.  We will review these and any progress made next session.  Is needing to also focus more on the positives versus negatives and agreed to keep a list over the next couple weeks of the positives that she notices and we will follow-up in sharing that at next session.  To continue work on intercepting anxious or depressive thoughts, challenging them and replacing with more reality based, positive, empowering thoughts that do not support anxiety nor depression.  Encouraged good self-care including healthy nutrition, some physical movement as able, being in contact with people who are supportive of her, getting outside some every day, and making herself talk more positive and recognizing  her gains as she works with strategies in her treatment plan above.  Goal review and progress/challenges noted with patient.  Next appointment within 2 to 3 weeks.   Mathis Fare, LCSW

## 2020-08-07 ENCOUNTER — Other Ambulatory Visit: Payer: Self-pay

## 2020-08-07 ENCOUNTER — Ambulatory Visit (INDEPENDENT_AMBULATORY_CARE_PROVIDER_SITE_OTHER): Payer: 59 | Admitting: Psychiatry

## 2020-08-07 DIAGNOSIS — F411 Generalized anxiety disorder: Secondary | ICD-10-CM

## 2020-08-07 NOTE — Progress Notes (Signed)
      [facility] Counselor/Therapist Progress Note  Patient ID: Brianna Barr, MRN: [id number],    Date: 08/07/2020  Time Spent: 60 minutes  9:00am to 10:00am   Treatment Type: Individual Therapy  Reported Symptoms: anxiety, some depression  Mental Status Exam:  Appearance:   Neat     Behavior:  Appropriate, Sharing and Motivated  Motor:  Normal  Speech/Language:   Clear and Coherent  Affect:  anxious  Mood:  anxious  Thought process:  normal  Thought content:    WNL  Sensory/Perceptual disturbances:    WNL  Orientation:  oriented to person, place, time/date, situation, day of week, month of year and year  Attention:  Fair  Concentration:  Fair  Memory:  WNL  Fund of knowledge:   Good  Insight:    Good  Judgment:   Good  Impulse Control:  Good   Risk Assessment: Danger to Self:  No Self-injurious Behavior: No Danger to Others: No Duty to Warn:no Physical Aggression / Violence:No  Access to Firearms a concern: No  Gang Involvement:No   Subjective: Patient today reports anxiety and some depression." I think overall I've noticed some improvement in my mood, I followed up on suggestion from last session and did some of the tasks involved at home and that seems to have help my outlook .  Interventions: Cognitive Behavioral Therapy and Solution-Oriented/Positive Psychology  Diagnosis:   ICD-10-CM   1. Generalized anxiety disorder  F41.1      Plan of Care: Patient not signing tx plan on computer screen due to COVID.   Treatment Goals: Goals will remain on tx plan as patient works with strategies to meet her goals. Progress will be noted each session and documented in "Progress" section of Plan.  Long term goal: Reduce overalllevel, frequency, and intensity of the anxiety so that daily functioning is not impaired.  Short term goal: Increase understandingof beliefs and messages that produce anxiety, depression, or  "worries".  Strategy: Identify, challenge,and replace anxious/depressive/fearful self-talk with positive, realistic, and empowering self-talk.  Progress: Patient in today reporting anxiety, some depression and struggling with her often "fixed way of looking at things."  Worked well in session today using strategy above in her treatment plan.  Really good attitude and effort in working on personal issues today, that contribute to her symptoms of anxiety and some depression. Showing more energy recently "to change". Emphasis on self-care, setting limits without guilt, self-esteem, how to say "no" when she needs to.  Per last session, did change some of her home surroundings like mentioned last session, and that has helped make a positive impact for patient. Processed some of her positive feelings about having made some progress, and how she needs to work on "maintianing my gains and not let negativity pull me back."  Encouraged her to improve her self talk and speak to herself like she would a good friend rather than being overly critical, focus more on the positives than negatives, staying in the present versus the past or the future, healthy nutrition and some exercise as she is able, and maintaining contact with supportive people.  Goal review and progress/challenges noted with patient.  Next appointment within 2 weeks.   Mathis Fare, LCSW

## 2020-08-21 ENCOUNTER — Other Ambulatory Visit: Payer: Self-pay

## 2020-08-21 ENCOUNTER — Ambulatory Visit (INDEPENDENT_AMBULATORY_CARE_PROVIDER_SITE_OTHER): Payer: 59 | Admitting: Psychiatry

## 2020-08-21 DIAGNOSIS — F411 Generalized anxiety disorder: Secondary | ICD-10-CM | POA: Diagnosis not present

## 2020-08-21 NOTE — Progress Notes (Signed)
[facility] Counselor/Therapist Progress Note  Patient ID: Brianna Barr, MRN: [id number],    Date: 08/21/2020  Time Spent: 9:00am to 10:00am  Treatment Type: Individual Therapy  Reported Symptoms: anxiety, depression  Mental Status Exam:  Appearance:   Casual     Behavior:  Appropriate, Sharing and Motivated  Motor:  Normal  Speech/Language:   Clear and Coherent  Affect:  anxious, depressed  Mood:  anxious and depressed  Thought process:  normal  Thought content:    WNL  Sensory/Perceptual disturbances:    WNL  Orientation:  oriented to person, place, time/date, situation, day of week, month of year and year  Attention:  Good  Concentration:  Fair  Memory:  WNL  Fund of knowledge:   Good  Insight:    Good  Judgment:   Good  Impulse Control:  Good   Risk Assessment: Danger to Self:  No Self-injurious Behavior: No Danger to Others: No Duty to Warn:no Physical Aggression / Violence:No  Access to Firearms a concern: No  Gang Involvement:No   Subjective: Patient today states that she's still having anxiety and depression, but I've also been more motivated some recently. Working some extra hours so have been more tired.  Interventions: Cognitive Behavioral Therapy and Solution-Oriented/Positive Psychology  Diagnosis:   ICD-10-CM   1. Generalized anxiety disorder  F41.1     Plan of Care: Patient not signing tx plan on computer screen due to COVID.   Treatment Goals: Goals will remain on tx plan as patient works with strategies to meet her goals. Progress will be noted each session and documented in "Progress" section of Plan.  Long term goal: Reduce overalllevel, frequency, and intensity of the anxiety so that daily functioning is not impaired.  Short term goal: Increase understandingof beliefs and messages that produce anxiety, depression, or "worries".  Strategy: Identify, challenge,and replace anxious/depressive/fearful self-talk with  positive, realistic, and empowering self-talk.  Progress: Patient in today reporting anxiety and depression, and that she is still more motivated. Came in with her homework from prior session about boundary-setting, being able to say "no", identifying negative self-talk more quickly, knowing she is "worthy" even though I'm not perfect and my worthiness is not based on what I do", looking for positives and not expecting negatives, celebrate small "victories".  These issues have been very very difficult for patient and admits her" list shared above are things I've thought about but now am starting to realize I need to change in those areas." Worked diligently in session today on each of of the points in her list and stated currently her most difficult strategy she is working on is to Look for more Positives versus Negatives. Difficult for patient as "I've never been that way."  Reinforced her efforts and good work on this today and she is to continue with more emphasis on the positives versus negatives outlook---"as that has a lot to do with my anxiety and depression" and has already notices some improvement in both.  Also encouraged other positive self-care including staying in the present versus the past or the future, talking to herself like she would a good friend, staying in contact with supportive people, healthy nutrition and exercise, letting go of rigid ways of looking at things, releasing guilt, feeding her self-esteem and positive ways, and making her self-talk more positive.  Goal review and progress/challenges noted with patient.  Next appointment within 2 weeks.   Mathis Fare, LCSW

## 2020-09-06 ENCOUNTER — Ambulatory Visit (INDEPENDENT_AMBULATORY_CARE_PROVIDER_SITE_OTHER): Payer: 59 | Admitting: Psychiatry

## 2020-09-06 ENCOUNTER — Other Ambulatory Visit: Payer: Self-pay

## 2020-09-06 DIAGNOSIS — F411 Generalized anxiety disorder: Secondary | ICD-10-CM | POA: Diagnosis not present

## 2020-09-06 NOTE — Progress Notes (Signed)
      [facility] Counselor/Therapist Progress Note  Patient ID: Brianna Barr, MRN: [id number],    Date: 09/06/2020  Time Spent:  60 minutes   8:00am to 9:00am  Treatment Type: Individual Therapy  Reported Symptoms: anxiety, depression (anxiety strongest per patient)  Mental Status Exam:  Appearance:   Casual     Behavior:  Appropriate, Sharing and Motivated  Motor:  Normal  Speech/Language:   Clear and Coherent  Affect:  anxious, depressed  Mood:  anxious and depressed  Thought process:  normal  Thought content:    WNL  Sensory/Perceptual disturbances:    WNL  Orientation:  oriented to person, place, time/date, situation, day of week, month of year and year  Attention:  Good  Concentration:  Good and Fair  Memory:  WNL  Fund of knowledge:   Good  Insight:    Good  Judgment:   Good  Impulse Control:  Good   Risk Assessment: Danger to Self:  No Self-injurious Behavior: No Danger to Others: No Duty to Warn:no Physical Aggression / Violence:No  Access to Firearms a concern: No  Gang Involvement:No   Subjective:  Patient today reports anxiety and depression, with anxiety being the stronger symptom.  "Struggling with feeling my life is stagnant and other's lives are not."   Interventions: Cognitive Behavioral Therapy and Solution-Oriented/Positive Psychology  Diagnosis:   ICD-10-CM   1. Generalized anxiety disorder  F41.1     Plan of Care: Patient not signing tx plan on computer screen due to COVID.   Treatment Goals: Goals will remain on tx plan as patient works with strategies to meet her goals. Progress will be noted each session and documented in "Progress" section of Plan.  Long term goal: Reduce overalllevel, frequency, and intensity of the anxiety so that daily functioning is not impaired.  Short term goal: Increase understandingof beliefs and messages that produce anxiety, depression, or "worries".  Strategy: Identify, challenge,and  replace anxious/depressive/fearful self-talk with positive, realistic, and empowering self-talk.  Progress: Patient in today reporting anxiety and depression with anxiety being the strongest symptom. Feeling life is stagnant and not moving forward. Sees friends making forward movement in their lives and compares herself.  Self-talk has been more negative recently "when my thoughts have been worse."  Processed her anxious/negative thoughts that she's been having recently and she was able to come up with replacement thoughts that she understands to be more positive and empowering, also more reality-based.  To be assertive and work out plan to take her earned time off at work so she does not lose it.  Working on not judging and comparing herself with others, per long term goal and strategy above in treatment plan.  To continue saying "no" when needed, remind herself that her worthiness is not based on what she does, and celebrate the smaller "victories/achievements/positive changes." Encouraged patient to maintain contact with supportive people, staying in the present versus jumping ahead into the future, looking more for the positives rather than the negatives each day, using positive self talk, continue working to let go of rigid ways of looking at things, healthy nutrition and exercise, continue her work on letting go of guilt, and refrain from comparing herself with others.  Goal review and progress/challenges noted with patient.  Next appt within 2-3 weeks.   Mathis Fare, LCSW

## 2020-09-19 ENCOUNTER — Other Ambulatory Visit: Payer: Self-pay

## 2020-09-19 ENCOUNTER — Ambulatory Visit (INDEPENDENT_AMBULATORY_CARE_PROVIDER_SITE_OTHER): Payer: 59 | Admitting: Psychiatry

## 2020-09-19 DIAGNOSIS — F411 Generalized anxiety disorder: Secondary | ICD-10-CM | POA: Diagnosis not present

## 2020-09-19 NOTE — Progress Notes (Signed)
[facility] Counselor/Therapist Progress Note  Patient ID: Brianna Barr, MRN: [id number],    Date: 09/19/2020  Time Spent: 60 minutes  9:00am to 10:00am  Treatment Type: Individual Therapy  Reported Symptoms: anxiety, depression, anger (re: situation with friend)  Mental Status Exam:  Appearance:   Neat     Behavior:  Appropriate, Sharing and Motivated  Motor:  Normal  Speech/Language:   WNL  Affect:  anxious, depressed  Mood:  anxious, depressed and some anger  Thought process:  normal  Thought content:    WNL  Sensory/Perceptual disturbances:    WNL  Orientation:  oriented to person, place, time/date, situation, day of week, month of year and year  Attention:  Good  Concentration:  Fair  Memory:  WNL  Fund of knowledge:   Good  Insight:    Good  Judgment:   Good  Impulse Control:  Good   Risk Assessment: Danger to Self:  No Self-injurious Behavior: No Danger to Others: No Duty to Warn:no Physical Aggression / Violence:No  Access to Firearms a concern: No  Gang Involvement:No   Subjective: Patient reports anxiety, depression, anger, some frustration. Past 2-3 weeks has had some more positive moments and was more motivated but this past weekend there were things happened involving friends that "made me more angry, frustrated, and depressed/anxious."   Interventions: Cognitive Behavioral Therapy and Solution-Oriented/Positive Psychology  Diagnosis:   ICD-10-CM   1. Generalized anxiety disorder  F41.1     Plan of Care: Patient not signing tx plan on computer screen due to COVID.   Treatment Goals: Goals will remain on tx plan as patient works with strategies to meet her goals. Progress will be noted each session and documented in "Progress" section of Plan.  Long term goal: Reduce overalllevel, frequency, and intensity of the anxiety so that daily functioning is not impaired.  Short term goal: Increase understandingof beliefs and  messages that produce anxiety, depression, or "worries".  Strategy: Identify, challenge,and replace anxious/depressive/fearful self-talk with positive, realistic, and empowering self-talk.  Progress: Patient in today reporting anxiety, depression, frustration, and anger, with Anxiety being the stronger emotion. Was involved in a scary situation recently with a "first meeting" of a female that wanted to meet her.( Not all details included in this note due to patient privacy needs.) Patient processed her feelings of anxiety, fearfulness, and depression, and shared how she got a friend on the phone with her when person followed her through parking lot. Situation has still bothered her and sleep has been less but seems to be improving.  She feels after talking situation through more today and having good friends who are aware of what happening, and blocking person on social media and her phone. Reports beyond that situation happening, other things have still been some better.  Has continued her work on decreasing negative self-talk, not judging herself as much, saying "no" when she needs to, and not comparing herself as much to others per strategy and long term goal in tx plan above.  Also reviewed with patient, from last session, her "worthiness is not based on what she does and her celebrating the smaller victories/achievement/and positive changes".  Encouraged patient to maintain contact with supportive people, work further on letting go of guilt, looking for positives each day, continuing her work on more affirming self talk, staying in the present, and continuing her work of letting go of the rigid ways she has had of looking at things and working to be  more flexible and open to making changes.  Goal review and progress/challenges noted with patient.  Next appt within 2-3 weeks.    Mathis Fare, LCSW

## 2020-10-03 ENCOUNTER — Encounter: Payer: Self-pay | Admitting: Family Medicine

## 2020-10-03 ENCOUNTER — Other Ambulatory Visit: Payer: Self-pay

## 2020-10-03 ENCOUNTER — Ambulatory Visit (INDEPENDENT_AMBULATORY_CARE_PROVIDER_SITE_OTHER): Payer: Commercial Managed Care - PPO

## 2020-10-03 ENCOUNTER — Ambulatory Visit (INDEPENDENT_AMBULATORY_CARE_PROVIDER_SITE_OTHER): Payer: Commercial Managed Care - PPO | Admitting: Family Medicine

## 2020-10-03 VITALS — BP 112/72 | HR 106 | Temp 98.4°F | Wt 231.8 lb

## 2020-10-03 DIAGNOSIS — N644 Mastodynia: Secondary | ICD-10-CM

## 2020-10-03 DIAGNOSIS — R591 Generalized enlarged lymph nodes: Secondary | ICD-10-CM

## 2020-10-03 NOTE — Progress Notes (Addendum)
Subjective:    Patient ID: Brianna Barr, female    DOB: 1996/05/18, 24 y.o.   MRN: [id number]  No chief complaint on file.   HPI Patient was seen today for acute concern.  Pt endorses tenderness in R axilla and breast x 2 wks.  Pt has Mirena IUD in place x 3 yrs, no having menses.  Pt denies nipple inversion, drainage, skin changes, fever, chills.  Had COVID-19 booster and influenza vaccine 7.5 wks ago.   Past Medical History:  Diagnosis Date  . Anxiety   . Asthma   . Depression     No Known Allergies  ROS General: Denies fever, chills, night sweats, changes in weight, changes in appetite HEENT: Denies headaches, ear pain, changes in vision, rhinorrhea, sore throat CV: Denies CP, palpitations, SOB, orthopnea Pulm: Denies SOB, cough, wheezing GI: Denies abdominal pain, nausea, vomiting, diarrhea, constipation GU: Denies dysuria, hematuria, frequency, vaginal discharge  +R breast pain Msk: Denies muscle cramps, joint pains Neuro: Denies weakness, numbness, tingling Skin: Denies rashes, bruising Psych: Denies depression, anxiety, hallucinations     Objective:    Blood pressure 112/72, pulse (!) 106, temperature 98.4 F (36.9 C), temperature source Oral, weight 231 lb 12.8 oz (105.1 kg), SpO2 99 %.   Gen. Pleasant, well-nourished, in no distress, normal affect   HEENT: [place]/AT, face symmetric, conjunctiva clear, no scleral icterus, PERRLA, EOMI, nares patent without drainage, R cervical lymphadenopathy ~ 7 mm and tenderness>. Lungs: no accessory muscle use Cardiovascular: RRR, no peripheral edema Breast: Large, pendulous, symmetric without deformity.  TTP of right axillary area with palpable axillary lymph nodes.  TTP of right breast upper outer quadrant> other areas.  Fibrocystic changes noted b/l.  Palpable left axillary nodes noted.  No nipple d/c or inversion, no peau d'orange. Musculoskeletal: No deformities, no cyanosis or clubbing, normal tone Neuro:  A&Ox3, CN  II-XII intact, normal gait Skin:  Warm, no lesions/ rash   Wt Readings from Last 3 Encounters:  10/03/20 231 lb 12.8 oz (105.1 kg)  06/25/20 223 lb (101.2 kg)  11/03/19 228 lb (103.4 kg)    Lab Results  Component Value Date   WBC 8.5 08/18/2019   HGB 12.7 08/18/2019   HCT 38.6 08/18/2019   PLT 411.0 (H) 08/18/2019   GLUCOSE 84 08/18/2019   NA 140 08/18/2019   K 4.0 08/18/2019   CL 106 08/18/2019   CREATININE 0.86 08/18/2019   BUN 11 08/18/2019   CO2 24 08/18/2019   TSH 2.38 08/18/2019   HGBA1C 5.4 08/18/2019    Assessment/Plan:  Lymphadenopathy  -We will obtain CBC and CXR -Given precautions - Plan: DG Chest 2 View, CBC with Differential/Platelet, US BREAST LTD UNI LEFT INC AXILLA, US BREAST LTD UNI RIGHT INC AXILLA, CBC with Differential/Platelet  Breast pain -Discussed supportive care -Given handout -Given precautions -We will obtain imaging - Plan: US BREAST LTD UNI LEFT INC AXILLA, US BREAST LTD UNI RIGHT INC AXILLA  F/u prn  Abbe Amsterdam, MD

## 2020-10-03 NOTE — Patient Instructions (Signed)
Breast Tenderness Breast tenderness is a common problem for women of all ages, but may also occur in men. Breast tenderness may range from mild discomfort to severe pain. In women, the pain usually comes and goes with the menstrual cycle, but it can also be constant. Breast tenderness has many possible causes, including hormone changes, infections, and taking certain medicines. You may have tests, such as a mammogram or an ultrasound, to check for any unusual findings. Having breast tenderness usually does not mean that you have breast cancer. Follow these instructions at home: Managing pain and discomfort   If directed, put ice to the painful area. To do this: ? Put ice in a plastic bag. ? Place a towel between your skin and the bag. ? Leave the ice on for 20 minutes, 2-3 times a day.  Wear a supportive bra, especially during exercise. You may also want to wear a supportive bra while sleeping if your breasts are very tender. Medicines  Take over-the-counter and prescription medicines only as told by your health care provider. If the cause of your pain is infection, you may be prescribed an antibiotic medicine.  If you were prescribed an antibiotic, take it as told by your health care provider. Do not stop taking the antibiotic even if you start to feel better. Eating and drinking  Your health care provider may recommend that you lessen the amount of fat in your diet. You can do this by: ? Limiting fried foods. ? Cooking foods using methods such as baking, boiling, grilling, and broiling.  Decrease the amount of caffeine in your diet. Instead, drink more water and choose caffeine-free drinks. General instructions   Keep a log of the days and times when your breasts are most tender.  Ask your health care provider how to do breast exams at home. This will help you notice if you have an unusual growth or lump.  Keep all follow-up visits as told by your health care provider. This is  important. Contact a health care provider if:  Any part of your breast is hard, red, and hot to the touch. This may be a sign of infection.  You are a woman and: ? Not breastfeeding and you have fluid, especially blood or pus, coming out of your nipples. ? Have a new or painful lump in your breast that remains after your menstrual period ends.  You have a fever.  Your pain does not improve or it gets worse.  Your pain is interfering with your daily activities. Summary  Breast tenderness may range from mild discomfort to severe pain.  Breast tenderness has many possible causes, including hormone changes, infections, and taking certain medicines.  It can be treated with ice, wearing a supportive bra, and medicines.  Make changes to your diet if told to by your health care provider. This information is not intended to replace advice given to you by your health care provider. Make sure you discuss any questions you have with your health care provider. Document Revised: 03/07/2019 Document Reviewed: 03/07/2019 Elsevier Patient Education  2020 [employer]  Lymphadenopathy  Lymphadenopathy means that your lymph glands are swollen or larger than normal (enlarged). Lymph glands, also called lymph nodes, are collections of tissue that filter bacteria, viruses, and waste from your bloodstream. They are part of your body's disease-fighting system (immune system), which protects your body from germs. There may be different causes of lymphadenopathy, depending on where it is in your body. Some types go away on  their own. Lymphadenopathy can occur anywhere that you have lymph glands, including these areas:  Neck (cervical lymphadenopathy).  Chest (mediastinal lymphadenopathy).  Lungs (hilar lymphadenopathy).  Underarms (axillary lymphadenopathy).  Groin (inguinal lymphadenopathy). When your immune system responds to germs, infection-fighting cells and fluid build up in your lymph glands.  This causes some swelling and enlargement. If the lymph glands do not go back to normal after you have an infection or disease, your health care provider may do tests. These tests help to monitor your condition and find the reason why the glands are still swollen and enlarged. Follow these instructions at home:  Get plenty of rest.  Take over-the-counter and prescription medicines only as told by your health care provider. Your health care provider may recommend over-the-counter medicines for pain.  If directed, apply heat to swollen lymph glands as often as told by your health care provider. Use the heat source that your health care provider recommends, such as a moist heat pack or a heating pad. ? Place a towel between your skin and the heat source. ? Leave the heat on for 20-30 minutes. ? Remove the heat if your skin turns bright red. This is especially important if you are unable to feel pain, heat, or cold. You may have a greater risk of getting burned.  Check your affected lymph glands every day for changes. Check other lymph gland areas as told by your health care provider. Check for changes such as: ? More swelling. ? Sudden increase in size. ? Redness or pain. ? Hardness.  Keep all follow-up visits as told by your health care provider. This is important. Contact a health care provider if you have:  Swelling that gets worse or spreads to other areas.  Problems with breathing.  Lymph glands that: ? Are still swollen after 2 weeks. ? Have suddenly gotten bigger. ? Are red, painful, or hard.  A fever or chills.  Fatigue.  A sore throat.  Pain in your abdomen.  Weight loss.  Night sweats. Get help right away if you have:  Fluid leaking from an enlarged lymph gland.  Severe pain.  Chest pain.  Shortness of breath. Summary  Lymphadenopathy means that your lymph glands are swollen or larger than normal (enlarged).  Lymph glands (also called lymph nodes) are  collections of tissue that filter bacteria, viruses, and waste from the bloodstream. They are part of your body's disease-fighting system (immune system).  Lymphadenopathy can occur anywhere that you have lymph glands.  If your enlarged and swollen lymph glands do not go back to normal after you have an infection or disease, your health care provider may do tests to monitor your condition and find the reason why the glands are still swollen and enlarged.  Check your affected lymph glands every day for changes. Check other lymph gland areas as told by your health care provider. This information is not intended to replace advice given to you by your health care provider. Make sure you discuss any questions you have with your health care provider. Document Revised: 09/25/2017 Document Reviewed: 08/28/2017 Elsevier Patient Education  2020 [employer]  Fibrocystic Breast Changes  Fibrocystic breast changes are changes in breast tissue that can cause breasts to become swollen, lumpy, or painful. This can happen due to buildup of scar-like tissue (fibrous tissue) or the forming of fluid-filled lumps (cysts) in the breast. This is a common condition, and it is not cancerous (is benign). The exact cause is not known, but it  seems to occur when women go through hormonal changes during their menstrual cycle. Fibrocystic breast changes can affect one or both breasts. What are the causes? The exact cause of fibrocystic breast changes is not known. However, this condition:  May be related to the female hormones estrogen and progesterone.  May be influenced by family traits that get passed from parent to child (genetics). What are the signs or symptoms? Symptoms of this condition may affect one or both breasts, and may include:  Tenderness, mild discomfort, or pain.  Swelling.  Rope-like tissue that can be felt when touching the breast.  Lumps in one or both breasts.  Changes in breast size.  Breasts may get larger before the menstrual period and smaller after the menstrual period.  Green or dark brown discharge from the nipple. Symptoms are usually worse before menstrual periods start, and they get better toward the end of menstrual periods. How is this diagnosed? This condition is diagnosed based on your medical history and a physical exam of your breasts. You may also have tests, such as:  A breast X-ray (mammogram).  Ultrasound of your breasts.  MRI.  Removal of a breast tissue sample for testing (breast biopsy). This may be done if your health care provider thinks that something else may be causing changes in your breasts. How is this treated? Often, treatment is not needed for this condition. In some cases, treatment may include:  Taking over-the-counter pain relievers to help lessen pain or discomfort.  Limiting or avoiding caffeine. Foods and beverages that contain caffeine include chocolate, soda, coffee, and tea.  Reducing sugar and fat in your diet. Your health care provider may also recommend:  A procedure to remove fluid from a cyst that is causing pain (fine needle aspiration).  Surgery to remove a cyst that is large or tender or does not go away. Follow these instructions at home:  Examine your breasts after every menstrual period. If you do not have menstrual periods, check your breasts on the first day of every month. Feel for changes in your breasts, such as: ? More tenderness. ? A new growth. ? A change in size. ? A change in an existing lump.  Take over-the-counter and prescription medicines only as told by your health care provider.  Wear a well-fitted support or sports bra, especially when exercising.  Decrease or avoid caffeine, fat, and sugar in your diet as directed by your health care provider. Contact a health care provider if:  You have fluid leaking from your nipple, especially if it is bloody.  You have new lumps or bumps in your  breast.  Your breast becomes enlarged, red, and painful.  You have areas of your breast that pucker inward.  Your nipple appears flat or indented. Get help right away if:  You have redness of your breast and the redness is spreading. Summary  Fibrocystic breast changes are changes in breast tissue that can cause breasts to become swollen, lumpy, or painful.  This condition may be related to the female hormones estrogen and progesterone.  With this condition, it is important to examine your breasts after every menstrual period. If you do not have menstrual periods, check your breasts on the first day of every month. This information is not intended to replace advice given to you by your health care provider. Make sure you discuss any questions you have with your health care provider. Document Revised: 09/25/2017 Document Reviewed: 06/11/2016 Elsevier Patient Education  2020 [employer].

## 2020-10-04 ENCOUNTER — Ambulatory Visit (INDEPENDENT_AMBULATORY_CARE_PROVIDER_SITE_OTHER): Payer: 59 | Admitting: Psychiatry

## 2020-10-04 DIAGNOSIS — F411 Generalized anxiety disorder: Secondary | ICD-10-CM | POA: Diagnosis not present

## 2020-10-04 LAB — CBC WITH DIFFERENTIAL/PLATELET
Absolute Monocytes: 653 cells/uL (ref 200–950)
Basophils Absolute: 51 cells/uL (ref 0–200)
Basophils Relative: 0.5 %
Eosinophils Absolute: 173 cells/uL (ref 15–500)
Eosinophils Relative: 1.7 %
HCT: 37.6 % (ref 35.0–45.0)
Hemoglobin: 11.8 g/dL (ref 11.7–15.5)
Lymphs Abs: 3437 cells/uL (ref 850–3900)
MCH: 23.4 pg — ABNORMAL LOW (ref 27.0–33.0)
MCHC: 31.4 g/dL — ABNORMAL LOW (ref 32.0–36.0)
MCV: 74.6 fL — ABNORMAL LOW (ref 80.0–100.0)
MPV: 10.3 fL (ref 7.5–12.5)
Monocytes Relative: 6.4 %
Neutro Abs: 5885 cells/uL (ref 1500–7800)
Neutrophils Relative %: 57.7 %
Platelets: 436 10*3/uL — ABNORMAL HIGH (ref 140–400)
RBC: 5.04 10*6/uL (ref 3.80–5.10)
RDW: 15.8 % — ABNORMAL HIGH (ref 11.0–15.0)
Total Lymphocyte: 33.7 %
WBC: 10.2 10*3/uL (ref 3.8–10.8)

## 2020-10-04 NOTE — Progress Notes (Signed)
[facility] Counselor/Therapist Progress Note  Patient ID: Brianna Barr, MRN: [id number],    Date: 10/04/2020  Time Spent:  60 minutes   9:00am to 10:00am  Treatment Type: Individual Therapy  Reported Symptoms: anxiety, depression  Mental Status Exam:  Appearance:   Neat     Behavior:  Appropriate, Sharing and Motivated  Motor:  Normal  Speech/Language:   Clear and Coherent  Affect:  anxious  Mood:  anxious and depressed  Thought process:  goal directed  Thought content:    WNL  Sensory/Perceptual disturbances:    WNL  Orientation:  oriented to person, place, time/date, situation, day of week, month of year and year  Attention:  Good  Concentration:  Good and Fair  Memory:  WNL  Fund of knowledge:   Good  Insight:    Good  Judgment:   Good  Impulse Control:  Good   Risk Assessment: Danger to Self:  No Self-injurious Behavior: No Danger to Others: No Duty to Warn:no Physical Aggression / Violence:No  Access to Firearms a concern: No  Gang Involvement:No   Subjective: Patient today reports anxiety and depression with anxiety being some better. States her self-talk is improving and helping some of her negative thoughts..    Interventions: Cognitive Behavioral Therapy and Solution-Oriented/Positive Psychology  Diagnosis:   ICD-10-CM   1. Generalized anxiety disorder  F41.1     Plan of Care: Patient not signing tx plan on computer screen due to [place].   Treatment Goals: Goals will remain on tx plan as patient works with strategies to meet her goals. Progress will be noted each session and documented in "Progress" section of Plan.  Long term goal: Reduce overalllevel, frequency, and intensity of the anxiety so that daily functioning is not impaired.  Short term goal: Increase understandingof beliefs and messages that produce anxiety, depression, or "worries".  Strategy: Identify, challenge,and replace anxious/depressive/fearful self-talk  with positive, realistic, and empowering self-talk.  Progress: Patient in today reporting anxiety and depression, in addition some "uncomfortable feelings" regarding guy she recently  met online (and discussed at last session) still causing her some "fearful" uneasiness but she is being "much more careful". Having some residual issues of fear after the interaction with this guy at mall. (Not all details included in this note due to patient privacy needs.) Reporting "health anxiety" and having fears re: breast cancer although has no history of that herself nor in her family, and described this in more detail today, and worked on some coping strategies and self care for patient as she is anxious and fearful about this pending situation.  Had recent appt for breast cysts and has follow up to happen at [facility] for further diagnostic testing and patient very worried. Tearfully processed her fearful thoughts about "what's next" in session today. Has been working to improve her self-talk to be less negative and more supportive with less self-judgment and less comparing self to others.  Encourage patient to stay in contact with supportive people, continue working on improving her self talk to be more positive and affirming, staying in the present, letting go of rigid ways that she has tended to judge herself, and looking for more positives every day which she acknowledges is very important for her.  Goal review and progress/challenges noted with patient.  Next appointment within 2 to 3 weeks.   Shanon Ace, LCSW

## 2020-10-15 ENCOUNTER — Ambulatory Visit (INDEPENDENT_AMBULATORY_CARE_PROVIDER_SITE_OTHER): Payer: 59 | Admitting: Psychiatry

## 2020-10-15 ENCOUNTER — Other Ambulatory Visit: Payer: Self-pay

## 2020-10-15 ENCOUNTER — Encounter: Payer: Self-pay | Admitting: Women's Health

## 2020-10-15 ENCOUNTER — Ambulatory Visit (INDEPENDENT_AMBULATORY_CARE_PROVIDER_SITE_OTHER): Payer: Commercial Managed Care - PPO | Admitting: Women's Health

## 2020-10-15 VITALS — BP 132/83 | HR 76 | Ht 62.0 in | Wt 232.0 lb

## 2020-10-15 DIAGNOSIS — N942 Vaginismus: Secondary | ICD-10-CM | POA: Diagnosis not present

## 2020-10-15 DIAGNOSIS — K582 Mixed irritable bowel syndrome: Secondary | ICD-10-CM | POA: Diagnosis not present

## 2020-10-15 DIAGNOSIS — F411 Generalized anxiety disorder: Secondary | ICD-10-CM | POA: Diagnosis not present

## 2020-10-15 DIAGNOSIS — N3941 Urge incontinence: Secondary | ICD-10-CM | POA: Diagnosis not present

## 2020-10-15 DIAGNOSIS — Z0000 Encounter for general adult medical examination without abnormal findings: Secondary | ICD-10-CM | POA: Diagnosis not present

## 2020-10-15 LAB — CYTOLOGY - PAP: Diagnosis: NEGATIVE

## 2020-10-15 NOTE — Progress Notes (Signed)
[facility] Counselor/Therapist Progress Note  Patient ID: Brianna Barr, MRN: [id number],    Date: 10/15/2020  Time Spent: 60 minutes     9:00am to 10:00am  Treatment Type: Individual Therapy  Reported Symptoms:  Anxiety, depression; states "Anxiety is the strongest"  Mental Status Exam:  Appearance:   Casual     Behavior:  Appropriate, Sharing and Motivated  Motor:  Normal  Speech/Language:   Clear and Coherent  Affect:  anxious  Mood:  anxious and depressed  Thought process:  goal directed  Thought content:    WNL  Sensory/Perceptual disturbances:    WNL  Orientation:  oriented to person, place, time/date, situation, day of week, month of year and year  Attention:  Good  Concentration:  Good  Memory:  WNL  Fund of knowledge:   Good  Insight:    Good  Judgment:   Good  Impulse Control:  Good   Risk Assessment: Danger to Self:  No Self-injurious Behavior: No Danger to Others: No Duty to Warn:no Physical Aggression / Violence:No  Access to Firearms a concern: No  Gang Involvement:No   Subjective: Patient today reporting anxiety and some depression.  Is however feeling some more self-confidence as she has gotten some more active and allowing herself to be open to dating and be with friends more.   Interventions: Cognitive Behavioral Therapy and Solution-Oriented/Positive Psychology  Diagnosis:   ICD-10-CM   1. Generalized anxiety disorder  F41.1      Plan of Care: Patient not signing tx plan on computer screen due to [place].   Treatment Goals: Goals will remain on tx plan as patient works with strategies to meet her goals. Progress will be noted each session and documented in "Progress" section of Plan.  Long term goal: Reduce overalllevel, frequency, and intensity of the anxiety so that daily functioning is not impaired.  Short term goal: Increase understandingof beliefs and messages that produce anxiety, depression, or  "worries".  Strategy: Identify, challenge,and replace anxious/depressive/fearful self-talk with positive, realistic, and empowering self-talk.  Progress: Patient reporting anxiety and depression as she continues to work on goals stated above.  Is trying to be more social and meet people, including some dating options. No further issues with her fears of a guy she met several weeks ago for a "date" that abruptly ended when guy acted inappropriate and continued to do so several minutes to where patient left.  Processed this some as she is to meet another potential date next week and is being careful but also looking forward to meeting him soon.  Some thoughts of former fiance that died, have come up recently, adding some sadness for patient which she also processed today.  Is anxiously awaiting appt this week at [facility] but hoping report will be good. Glad to not have to wait any longer for appt.  Health anxiety "continues but not quite as bad." Notices when she is able to interrupt it and replace with something less negative and trying to do this more often. Also continues working on self-talk and having less self-judgement and decreased comparing self to others. Encouraging patient to stay in the present, remain in contact with people who are supportive of her, working to make self-talk more positive and affirming, interrupt negative/rigid ways of viewing herself and intentionally look for more positives rather than negatives each day.    Goal review and progress/challenges noted with patient.  Next appointment within 2 to 3 weeks.   Shanon Ace,  LCSW

## 2020-10-15 NOTE — Progress Notes (Signed)
Pt is ion the office for annual exam. Last pap 2020 Pt reports hx of vaginismus Currently has Mirena IUD, placed 09-09-2018 at previous OBGYN office. Pt reports enlarged lymph node in R breast but she has an appt at breast center this week.

## 2020-10-15 NOTE — Progress Notes (Signed)
GYNECOLOGY ANNUAL PREVENTATIVE CARE ENCOUNTER NOTE  History:     Brianna Barr is a 24 y.o. G0P0000 female here for a routine annual gynecologic exam.  Current complaints: on-going vaginismus and on-going urinary incontinence (x several years), has enlarged lymph node and will be seen at [facility] this week.   Denies abnormal vaginal bleeding, discharge, pelvic pain, problems with intercourse or other gynecologic concerns. Pt declines STD testing today. Pt reports she does perform SBE. Pt reports bowel or bladder concerns (per above, pt does also report IBS). No family hx of breast, colon, endometrial or ovarian cancer. Paternal grandmother - uterine cancer (age 81). Maternal grandmother - ovarian cancer (age unknowon). Pt does not smoke, drink or use drugs. Last dental exam: has next appt Wednesday 10/17/2020. Last eye exam: 03/2020 .      Gynecologic History No LMP recorded. (Menstrual status: IUD). Menstruation: none (IUD) Contraception: IUD. Pt reports she does not desire pregnancy in the next year. Last Pap: 06/2019. Results were: normal per faxed records from [facility].  Obstetric History OB History  Gravida Para Term Preterm AB Living  0 0 0 0 0 0  SAB IAB Ectopic Multiple Live Births  0 0 0 0 0    Past Medical History:  Diagnosis Date  . Anxiety   . Asthma   . Depression   . Overactive bladder   . Stress incontinence     Past Surgical History:  Procedure Laterality Date  . WISDOM TOOTH EXTRACTION      Current Outpatient Medications on File Prior to Visit  Medication Sig Dispense Refill  . albuterol (VENTOLIN HFA) 108 (90 Base) MCG/ACT inhaler Inhale 2 puffs into the lungs every 6 (six) hours as needed for wheezing or shortness of breath. 6.7 g 1  . ALBUTEROL IN Inhale into the lungs.    Marland Kitchen buPROPion (WELLBUTRIN XL) 300 MG 24 hr tablet TAKE 1 TABLET BY MOUTH EVERY DAY (Patient not taking: No sig reported) 90 tablet 0  . cyclobenzaprine (FLEXERIL) 5 MG  tablet Take 1 tablet (5 mg total) by mouth at bedtime. (Patient not taking: No sig reported) 15 tablet 0  . ipratropium (ATROVENT) 0.06 % nasal spray Place 2 sprays into both nostrils 4 (four) times daily. (Patient not taking: Reported on 10/15/2020) 15 mL 0  . meloxicam (MOBIC) 15 MG tablet Take 1 tablet (15 mg total) by mouth daily. (Patient not taking: No sig reported) 30 tablet 2  . neomycin-polymyxin b-dexamethasone (MAXITROL) 3.5-10000-0.1 SUSP  (Patient not taking: Reported on 10/15/2020)    . Olopatadine HCl 0.2 % SOLN Apply to eye. (Patient not taking: No sig reported)    . predniSONE (DELTASONE) 10 MG tablet Take 5 tabs on day 1, 4 tabs on day 2, 3 tabs on day 3, 2 tabs on day 4, 1 tab on day 5. (Patient not taking: No sig reported) 15 tablet 0   No current facility-administered medications on file prior to visit.    No Known Allergies  Social History:  reports that she has never smoked. She has never used smokeless tobacco. She reports that she does not drink alcohol and does not use drugs.  Family History  Problem Relation Age of Onset  . Multiple sclerosis Mother   . Hypertension Father   . Hypertension Maternal Grandmother   . Diabetes Maternal Grandmother   . Heart disease Maternal Grandmother   . Cervical cancer Maternal Grandmother   . Stroke Paternal Grandmother   . Heart disease  Paternal Grandmother   . Hypertension Paternal Grandmother   . Diabetes Paternal Grandmother   . Endometrial cancer Paternal Grandmother   . Stroke Paternal Grandfather     The following portions of the patient's history were reviewed and updated as appropriate: allergies, current medications, past family history, past medical history, past social history, past surgical history and problem list.  Review of Systems Pertinent items noted in HPI and remainder of comprehensive ROS otherwise negative.  Physical Exam:  BP 132/83   Pulse 76   Ht 5\' 2"  (1.575 m)   Wt 232 lb (105.2 kg)   BMI  42.43 kg/m  CONSTITUTIONAL: Well-developed, well-nourished female in no acute distress.  HENT:  Normocephalic, atraumatic, External right and left ear normal. EYES: Conjunctivae and EOM are normal. Pupils are equal, round, and reactive to light. No scleral icterus.  NECK: Normal range of motion, supple, no masses.  Normal thyroid.  SKIN: Skin is warm and dry. No rash noted. Not diaphoretic. No erythema. No pallor. MUSCULOSKELETAL: Normal range of motion. No tenderness.  No cyanosis, clubbing, or edema. NEUROLOGIC: Alert and oriented to person, place, and time. Normal reflexes, muscle tone coordination. PSYCHIATRIC: Normal mood and affect. Normal behavior. Normal judgment and thought content. CARDIOVASCULAR: Normal heart rate noted, regular rhythm. RESPIRATORY: Clear to auscultation bilaterally. Effort and breath sounds normal, no problems with respiration noted. BREASTS: Symmetric in size. No masses, skin changes, nipple drainage, or lymphadenopathy. ABDOMEN: Soft, normal bowel sounds, no distention noted.  No tenderness, rebound or guarding.  PELVIC: not performed d/t vaginismus and not due for Pap and no new pelvic concerns.   Assessment and Plan:     1. Well woman exam (no gynecological exam)  2. Vaginismus -referred to MD for further evaluation and management  3. Urinary incontinence, urge - Ambulatory referral to Urogynecology  4. Irritable bowel syndrome with both constipation and diarrhea - Ambulatory referral to Gastroenterology  Routine preventative health maintenance measures emphasized. Self-breast awareness taught, importance discussed, advised when to RTC, SBA literature given. Please refer to After Visit Summary for other counseling recommendations.      , [facility] Women's Health Nurse Practitioner, [facility] for [facility]

## 2020-10-15 NOTE — Patient Instructions (Signed)
Preventive Care 21-24 Years Old, Female Preventive care refers to visits with your health care provider and lifestyle choices that can promote health and wellness. This includes:  A yearly physical exam. This may also be called an annual well check.  Regular dental visits and eye exams.  Immunizations.  Screening for certain conditions.  Healthy lifestyle choices, such as eating a healthy diet, getting regular exercise, not using drugs or products that contain nicotine and tobacco, and limiting alcohol use. What can I expect for my preventive care visit? Physical exam Your health care provider will check your:  Height and weight. This may be used to calculate body mass index (BMI), which tells if you are at a healthy weight.  Heart rate and blood pressure.  Skin for abnormal spots. Counseling Your health care provider may ask you questions about your:  Alcohol, tobacco, and drug use.  Emotional well-being.  Home and relationship well-being.  Sexual activity.  Eating habits.  Work and work environment.  Method of birth control.  Menstrual cycle.  Pregnancy history. What immunizations do I need?  Influenza (flu) vaccine  This is recommended every year. Tetanus, diphtheria, and pertussis (Tdap) vaccine  You may need a Td booster every 10 years. Varicella (chickenpox) vaccine  You may need this if you have not been vaccinated. Human papillomavirus (HPV) vaccine  If recommended by your health care provider, you may need three doses over 6 months. Measles, mumps, and rubella (MMR) vaccine  You may need at least one dose of MMR. You may also need a second dose. Meningococcal conjugate (MenACWY) vaccine  One dose is recommended if you are age 19-21 years and a first-year college student living in a residence hall, or if you have one of several medical conditions. You may also need additional booster doses. Pneumococcal conjugate (PCV13) vaccine  You may need  this if you have certain conditions and were not previously vaccinated. Pneumococcal polysaccharide (PPSV23) vaccine  You may need one or two doses if you smoke cigarettes or if you have certain conditions. Hepatitis A vaccine  You may need this if you have certain conditions or if you travel or work in places where you may be exposed to hepatitis A. Hepatitis B vaccine  You may need this if you have certain conditions or if you travel or work in places where you may be exposed to hepatitis B. Haemophilus influenzae type b (Hib) vaccine  You may need this if you have certain conditions. You may receive vaccines as individual doses or as more than one vaccine together in one shot (combination vaccines). Talk with your health care provider about the risks and benefits of combination vaccines. What tests do I need?  Blood tests  Lipid and cholesterol levels. These may be checked every 5 years starting at age 20.  Hepatitis C test.  Hepatitis B test. Screening  Diabetes screening. This is done by checking your blood sugar (glucose) after you have not eaten for a while (fasting).  Sexually transmitted disease (STD) testing.  BRCA-related cancer screening. This may be done if you have a family history of breast, ovarian, tubal, or peritoneal cancers.  Pelvic exam and Pap test. This may be done every 3 years starting at age 21. Starting at age 30, this may be done every 5 years if you have a Pap test in combination with an HPV test. Talk with your health care provider about your test results, treatment options, and if necessary, the need for more tests.   Follow these instructions at home: Eating and drinking   Eat a diet that includes fresh fruits and vegetables, whole grains, lean protein, and low-fat dairy.  Take vitamin and mineral supplements as recommended by your health care provider.  Do not drink alcohol if: ? Your health care provider tells you not to drink. ? You are  pregnant, may be pregnant, or are planning to become pregnant.  If you drink alcohol: ? Limit how much you have to 0-1 drink a day. ? Be aware of how much alcohol is in your drink. In the [place] one drink equals one 12 oz bottle of beer (355 mL), one 5 oz glass of wine (148 mL), or one 1 oz glass of hard liquor (44 mL). Lifestyle  Take daily care of your teeth and gums.  Stay active. Exercise for at least 30 minutes on 5 or more days each week.  Do not use any products that contain nicotine or tobacco, such as cigarettes, e-cigarettes, and chewing tobacco. If you need help quitting, ask your health care provider.  If you are sexually active, practice safe sex. Use a condom or other form of birth control (contraception) in order to prevent pregnancy and STIs (sexually transmitted infections). If you plan to become pregnant, see your health care provider for a preconception visit. What's next?  Visit your health care provider once a year for a well check visit.  Ask your health care provider how often you should have your eyes and teeth checked.  Stay up to date on all vaccines. This information is not intended to replace advice given to you by your health care provider. Make sure you discuss any questions you have with your health care provider. Document Revised: 06/24/2018 Document Reviewed: 06/24/2018 Elsevier Patient Education  2020 Elsevier Inc.        Breast Self-Awareness Breast self-awareness means being familiar with how your breasts look and feel. It involves checking your breasts regularly and reporting any changes to your health care provider. Practicing breast self-awareness is important. Sometimes changes may not be harmful (are benign), but sometimes a change in your breasts can be a sign of a serious medical problem. It is important to learn how to do this procedure correctly so that you can catch problems early, when treatment is more likely to be successful. All  women should practice breast self-awareness, including women who have had breast implants. What you need:  A mirror.  A well-lit room. How to do a breast self-exam A breast self-exam is one way to learn what is normal for your breasts and whether your breasts are changing. To do a breast self-exam: Look for changes  1. Remove all the clothing above your waist. 2. Stand in front of a mirror in a room with good lighting. 3. Put your hands on your hips. 4. Push your hands firmly downward. 5. Compare your breasts in the mirror. Look for differences between them (asymmetry), such as: ? Differences in shape. ? Differences in size. ? Puckers, dips, and bumps in one breast and not the other. 6. Look at each breast for changes in the skin, such as: ? Redness. ? Scaly areas. 7. Look for changes in your nipples, such as: ? Discharge. ? Bleeding. ? Dimpling. ? Redness. ? A change in position. Feel for changes Carefully feel your breasts for lumps and changes. It is best to do this while lying on your back on the floor, and again while sitting or standing in the tub   on your skin. Feel each breast in the following way: 1. Place the arm on the side of the breast you are examining above your head. 2. Feel your breast with the other hand. 3. Start in the nipple area and make -inch (2 cm) overlapping circles to feel your breast. Use the pads of your three middle fingers to do this. Apply light pressure, then medium pressure, then firm pressure. The light pressure will allow you to feel the tissue closest to the skin. The medium pressure will allow you to feel the tissue that is a little deeper. The firm pressure will allow you to feel the tissue close to the ribs. 4. Continue the overlapping circles, moving downward over the breast until you feel your ribs below your breast. 5. Move one finger-width toward the center of the body. Continue to use the -inch (2 cm) overlapping circles to  feel your breast as you move slowly up toward your collarbone. 6. Continue the up-and-down exam using all three pressures until you reach your armpit.  Write down what you find Writing down what you find can help you remember what to discuss with your health care provider. Write down:  What is normal for each breast.  Any changes that you find in each breast, including: ? The kind of changes you find. ? Any pain or tenderness. ? Size and location of any lumps.  Where you are in your menstrual cycle, if you are still menstruating. General tips and recommendations  Examine your breasts every month.  If you are breastfeeding, the best time to examine your breasts is after a feeding or after using a breast pump.  If you menstruate, the best time to examine your breasts is 5-7 days after your period. Breasts are generally lumpier during menstrual periods, and it may be more difficult to notice changes.  With time and practice, you will become more familiar with the variations in your breasts and more comfortable with the exam. Contact a health care provider if you:  See a change in the shape or size of your breasts or nipples.  See a change in the skin of your breast or nipples, such as a reddened or scaly area.  Have unusual discharge from your nipples.  Find a lump or thick area that was not there before.  Have pain in your breasts.  Have any concerns related to your breast health. Summary  Breast self-awareness includes looking for physical changes in your breasts, as well as feeling for any changes within your breasts.  Breast self-awareness should be performed in front of a mirror in a well-lit room.  You should examine your breasts every month. If you menstruate, the best time to examine your breasts is 5-7 days after your menstrual period.  Let your health care provider know of any changes you notice in your breasts, including changes in size, changes on the skin, pain  or tenderness, or unusual fluid from your nipples. This information is not intended to replace advice given to you by your health care provider. Make sure you discuss any questions you have with your health care provider. Document Revised: 06/01/2018 Document Reviewed: 06/01/2018 Elsevier Patient Education  2020 Elsevier Inc.   
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
## 2020-10-18 ENCOUNTER — Ambulatory Visit
Admission: RE | Admit: 2020-10-18 | Discharge: 2020-10-18 | Disposition: A | Discharge status: Home / Self Care | Payer: Commercial Managed Care - PPO | Source: Ambulatory Visit | Attending: Family Medicine | Admitting: Family Medicine

## 2020-10-18 ENCOUNTER — Other Ambulatory Visit: Payer: Self-pay

## 2020-10-18 DIAGNOSIS — R591 Generalized enlarged lymph nodes: Secondary | ICD-10-CM

## 2020-10-18 DIAGNOSIS — N644 Mastodynia: Secondary | ICD-10-CM

## 2020-10-23 ENCOUNTER — Other Ambulatory Visit: Payer: Self-pay

## 2020-10-23 ENCOUNTER — Ambulatory Visit (INDEPENDENT_AMBULATORY_CARE_PROVIDER_SITE_OTHER): Payer: Commercial Managed Care - PPO | Admitting: Obstetrics and Gynecology

## 2020-10-23 ENCOUNTER — Encounter: Payer: Self-pay | Admitting: Obstetrics and Gynecology

## 2020-10-23 VITALS — BP 128/84 | HR 99 | Ht 62.0 in | Wt 231.0 lb

## 2020-10-23 DIAGNOSIS — N393 Stress incontinence (female) (male): Secondary | ICD-10-CM

## 2020-10-23 DIAGNOSIS — N3281 Overactive bladder: Secondary | ICD-10-CM

## 2020-10-23 DIAGNOSIS — N941 Unspecified dyspareunia: Secondary | ICD-10-CM

## 2020-10-23 DIAGNOSIS — R3915 Urgency of urination: Secondary | ICD-10-CM | POA: Diagnosis not present

## 2020-10-23 DIAGNOSIS — M62838 Other muscle spasm: Secondary | ICD-10-CM

## 2020-10-23 LAB — POCT URINALYSIS DIPSTICK
Appearance: NORMAL
Bilirubin, UA: NEGATIVE
Blood, UA: NEGATIVE
Glucose, UA: NEGATIVE
Ketones, UA: NEGATIVE
Leukocytes, UA: NEGATIVE
Nitrite, UA: NEGATIVE
Protein, UA: NEGATIVE
Spec Grav, UA: 1.01 (ref 1.010–1.025)
Urobilinogen, UA: 0.2 E.U./dL
pH, UA: 7 (ref 5.0–8.0)

## 2020-10-23 MED ORDER — MIRABEGRON ER 25 MG PO TB24: 25.0000 mg | ORAL_TABLET | Freq: Every day | ORAL | 5 refills | Status: DC

## 2020-10-23 MED ORDER — LIDOCAINE HCL URETHRAL/MUCOSAL 2 % EX GEL
1.0000 | CUTANEOUS | 3 refills | Status: DC | PRN
Start: 2020-10-23 — End: 2020-11-28

## 2020-10-23 MED ORDER — DIAZEPAM 5 MG PO TABS: ORAL_TABLET | ORAL | 0 refills | Status: DC

## 2020-10-23 NOTE — Patient Instructions (Addendum)
Constipation: Our goal is to achieve formed bowel movements daily or every-other-day.  You may need to try different combinations of the following options to find what works best for you - everybody's body works differently so feel free to adjust the dosages as needed.  Some options to help maintain bowel health include:  Marland Kitchen Dietary changes (more leafy greens, vegetables and fruits; less processed foods) . Fiber supplementation (Benefiber, FiberCon, Metamucil or Psyllium). Start slow and increase gradually to full dose. . Over-the-counter agents such as: stool softeners (Docusate or Colace) and/or laxatives (Miralax, milk of magnesia)  . "Power Pudding" is a natural mixture that may help your constipation.  To make blend 1 cup applesauce, 1 cup wheat bran, and 3/4 cup prune juice, refrigerate and then take 1 tablespoon daily with a large glass of water as needed.   I will prescribe valium 5 mg pills to place vaginally up to 2 times a day for vaginal muscle spasms. Start at night and take one every night for the next several weeks to see if it improves your symptoms. Once you are improving you can taper off the medication and just use as needed. If the medication makes you drowsy then only use at bedtime and/or we can reduce the dose. Do not use gel to place it, as that will prevent the tablet from dissolving.  Instead place 1-2 drops of water on the table before inserting it in the vagina. If the pills do not dissolve well we can switch to a special compounded suppository. Let me know how you are doing on the medication and if you have any questions.    .We discussed the symptoms of overactive bladder (OAB), which include urinary urgency, urinary frequency, night-time urination, with or without urge incontinence.  We discussed management including behavioral therapy (decreasing bladder irritants by following a bladder diet, urge suppression strategies, timed voids, bladder retraining), physical therapy,  medication; and for refractory cases posterior tibial nerve stimulation, sacral neuromodulation, and intravesical botulinum toxin injection.    For Beta-3 agonist medication, we discussed the potential side effect of elevated blood pressure which is more likely to occur in individuals with uncontrolled hypertension. You were given a prescription for Myrbetriq 25 mg.  It can take a month to start working so give it time, but if you have bothersome side effects call sooner and we can try a different medication.  Call us if you have trouble filling the prescription or if it's not covered by your insurance.

## 2020-10-29 ENCOUNTER — Encounter: Payer: 59 | Admitting: Psychiatry

## 2020-10-29 NOTE — Progress Notes (Signed)
  This encounter was created in error - please disregard This encounter was created in error - please disregard. This encounter was created in error - please disregard. This encounter was created in error - please disregard. This encounter was created in error - please disregard. 

## 2020-10-31 ENCOUNTER — Ambulatory Visit (INDEPENDENT_AMBULATORY_CARE_PROVIDER_SITE_OTHER): Payer: Commercial Managed Care - PPO | Admitting: Obstetrics and Gynecology

## 2020-10-31 ENCOUNTER — Other Ambulatory Visit: Payer: Self-pay

## 2020-10-31 ENCOUNTER — Encounter: Payer: Self-pay | Admitting: Obstetrics and Gynecology

## 2020-10-31 VITALS — BP 127/80 | HR 93 | Ht 62.0 in | Wt 234.2 lb

## 2020-10-31 DIAGNOSIS — N942 Vaginismus: Secondary | ICD-10-CM

## 2020-10-31 NOTE — Progress Notes (Unsigned)
Pt is in the office for GYN follow up, last seen in office on 10-15-20 Hx of vaginismus Pt was referred to urogyn, and had appt on 10-23-20. Pt reports that symptoms have not improved, reports stress incontinence and ability to hold urine when she feels the urge.

## 2020-11-01 ENCOUNTER — Encounter: Payer: Self-pay | Admitting: Obstetrics and Gynecology

## 2020-11-01 NOTE — Progress Notes (Signed)
GYNECOLOGY OFFICE FOLLOW UP NOTE  History:  25 y.o. G0P0000 here today for follow up for referral to Urogyn. She was seen by Dr. Florian Buff and recommended to restart PFPT, as well as start vaginal valium. She has not gotten call from PFPT yet and has not started valium. She is wanting to discuss odds of these therapies working for her. When she has done PFPt in the past, it did not fix her symptoms.    Past Medical History:  Diagnosis Date  . Anxiety   . Asthma   . Depression   . Overactive bladder   . Stress incontinence     Past Surgical History:  Procedure Laterality Date  . WISDOM TOOTH EXTRACTION       Current Outpatient Medications:  .  albuterol (VENTOLIN HFA) 108 (90 Base) MCG/ACT inhaler, Inhale 2 puffs into the lungs every 6 (six) hours as needed for wheezing or shortness of breath., Disp: 6.7 g, Rfl: 1 .  ALBUTEROL IN, Inhale into the lungs., Disp: , Rfl:  .  diazepam (VALIUM) 5 MG tablet, Place 1 tablet vaginally nightly as needed for muscle spasm/ pelvic pain., Disp: 30 tablet, Rfl: 0 .  mirabegron ER (MYRBETRIQ) 25 MG TB24 tablet, Take 1 tablet (25 mg total) by mouth daily., Disp: 30 tablet, Rfl: 5 .  buPROPion (WELLBUTRIN XL) 300 MG 24 hr tablet, TAKE 1 TABLET BY MOUTH EVERY DAY (Patient not taking: No sig reported), Disp: 90 tablet, Rfl: 0 .  cyclobenzaprine (FLEXERIL) 5 MG tablet, Take 1 tablet (5 mg total) by mouth at bedtime. (Patient not taking: Reported on 10/31/2020), Disp: 15 tablet, Rfl: 0 .  ipratropium (ATROVENT) 0.06 % nasal spray, Place 2 sprays into both nostrils 4 (four) times daily. (Patient not taking: No sig reported), Disp: 15 mL, Rfl: 0 .  lidocaine (XYLOCAINE) 2 % jelly, Apply 1 application topically as needed. (Patient not taking: Reported on 10/31/2020), Disp: 30 mL, Rfl: 3 .  meloxicam (MOBIC) 15 MG tablet, Take 1 tablet (15 mg total) by mouth daily. (Patient not taking: No sig reported), Disp: 30 tablet, Rfl: 2 .  neomycin-polymyxin  b-dexamethasone (MAXITROL) 3.5-10000-0.1 SUSP, , Disp: , Rfl:  .  Olopatadine HCl 0.2 % SOLN, Apply to eye. (Patient not taking: No sig reported), Disp: , Rfl:  .  predniSONE (DELTASONE) 10 MG tablet, Take 5 tabs on day 1, 4 tabs on day 2, 3 tabs on day 3, 2 tabs on day 4, 1 tab on day 5. (Patient not taking: No sig reported), Disp: 15 tablet, Rfl: 0  The following portions of the patient's history were reviewed and updated as appropriate: allergies, current medications, past family history, past medical history, past social history, past surgical history and problem list.   Review of Systems:  Pertinent items noted in HPI and remainder of comprehensive ROS otherwise negative.   Objective:  Physical Exam BP 127/80   Pulse 93   Ht 5\' 2"  (1.575 m)   Wt 234 lb 3.2 oz (106.2 kg)   BMI 42.84 kg/m  CONSTITUTIONAL: Well-developed, well-nourished female in no acute distress.  HENT:  Normocephalic, atraumatic. External right and left ear normal. Oropharynx is clear and moist EYES: Conjunctivae and EOM are normal. Pupils are equal, round, and reactive to light. No scleral icterus.  NECK: Normal range of motion, supple, no masses SKIN: Skin is warm and dry. No rash noted. Not diaphoretic. No erythema. No pallor. NEUROLOGIC: Alert and oriented to person, place, and time. Normal reflexes, muscle tone  coordination. No cranial nerve deficit noted. PSYCHIATRIC: Normal mood and affect. Normal behavior. Normal judgment and thought content. CARDIOVASCULAR: Normal heart rate noted RESPIRATORY: Effort normal, no problems with respiration noted ABDOMEN: Soft, no distention noted.   PELVIC: deferred MUSCULOSKELETAL: Normal range of motion. No edema noted.   Assessment & Plan:  1. Vaginismus - Reviewed plan by Urogyn for patient, recommended that she attempt therapies recommended. I relayed that I have had a lot of patient who have had success with pelvic floor physical therapy, reviewed that it will take  some time to achieve some relief/improvement in symptoms and reviewed her goals. She would like to be pain free and not have any more issues. I reviewed with her that even some modest improvement in symptoms would be beneficial, even if she is not 100% without issues and that she likely will need to continue some form of therapy for many years to maintain results. Encouraged her to try plan by Dr. Florian Buff, patient reassured by discussion and will try plan. She will call with any issues.   Routine preventative health maintenance measures emphasized. Please refer to After Visit Summary for other counseling recommendations.   Return if symptoms worsen or fail to improve.  Total face-to-face time with patient: 18 minutes. Over 50% of encounter was spent on counseling and coordination of care.  Baldemar Lenis, MD, [facility] Attending Center for [employer] [facility])

## 2020-11-12 ENCOUNTER — Ambulatory Visit: Payer: 59 | Admitting: Psychiatry

## 2020-11-23 ENCOUNTER — Other Ambulatory Visit: Payer: Self-pay

## 2020-11-23 ENCOUNTER — Ambulatory Visit (INDEPENDENT_AMBULATORY_CARE_PROVIDER_SITE_OTHER): Payer: 59 | Admitting: Psychiatry

## 2020-11-23 DIAGNOSIS — F411 Generalized anxiety disorder: Secondary | ICD-10-CM

## 2020-11-23 NOTE — Progress Notes (Signed)
[facility] Counselor/Therapist Progress Note  Patient ID: Brianna Barr, MRN: [id number],    Date: 11/23/2020  Time Spent: 60 minutes   8:00am to 9:00am   Treatment Type: Individual Therapy  Reported Symptoms: anxiety, depression and states anxiety is the stronger symptom  Mental Status Exam:  Appearance:   Casual     Behavior:  Appropriate, Sharing and Motivated  Motor:  Normal  Speech/Language:   Clear and Coherent  Affect:  anxious, depressed  Mood:  anxious and depressed  Thought process:  goal directed  Thought content:    WNL  Sensory/Perceptual disturbances:    WNL  Orientation:  oriented to person, place, time/date, situation, day of week, month of year and year  Attention:  Good  Concentration:  Good and Fair  Memory:  WNL  Fund of knowledge:   Good  Insight:    Good  Judgment:   Good  Impulse Control:  Good   Risk Assessment: Danger to Self:  No Self-injurious Behavior: No Danger to Others: No Duty to Warn:no Physical Aggression / Violence:No  Access to Firearms a concern: No  Gang Involvement:No   Subjective: Patient today reporting anxiety and depression, and "anxiety is stronger symptom". Stressors include: family/friends/work/Covid/and personal.  Interventions: Cognitive Behavioral Therapy and Solution-Oriented/Positive Psychology  Diagnosis:   ICD-10-CM   1. Generalized anxiety disorder  F41.1       Plan of Care: Patient not signing tx plan on computer screen due to COVID.   Treatment Goals: Goals will remain on tx plan as patient works with strategies to meet her goals. Progress will be noted each session and documented in "Progress" section of Plan.  Long term goal: Reduce overalllevel, frequency, and intensity of the anxiety so that daily functioning is not impaired.  Short term goal: Increase understandingof beliefs and messages that produce anxiety, depression, or "worries".  Strategy: Identify, challenge,and  replace anxious/depressive/fearful self-talk with positive, realistic, and empowering self-talk.  Progress: Patient in today reporting anxiety and depression re: personal/family/work/friends/Covid. Is single and is trying to date some which has been more stressful recently and processed some of her social anxiety and concerns she has in reference to meeting new people "because they are strangers to me except for some talking prior to meeting in person".  Discussed her social anxiety and how it impacts her meeting people and how she might take smaller steps to reach out and work to strengthen her social skills by practicing some of what we're discussing today, and also work on her self-talk to make it more positive and empowering.  Family issues continue as it's hard living with parents and being a single adult wanting to make more of her own decisions and experiencing frequent communication difficulties.  Hurt by some "kiddng about mental health issues" but she knows family doesn't mean to be hurtful to her. Worked with her short term goal and strategy in her tx plan above to more quickly identify/challeng/and replace anxious/depressive/fearful thoughts and self-talk with more reality-based and encouraging thoughts and self-talk, which she feels would help her social anxiety and working on her self-confidence.  Encouraged patient to continue interrupting negative/rigid ways of viewing herself, intentionally looking for and emphasizing positives on a daily basis, making self talk more encouraging and affirming, being able to let go more quickly of unhelpful comments from other people, work further on the social anxiety strategies we worked on in session today, and staying in contact with others who are supportive of her.  Goal review and progress/challenges noted with patient.  Next appointment within 3 weeks.   Mathis Fare, LCSW

## 2020-11-26 ENCOUNTER — Ambulatory Visit: Payer: 59 | Admitting: Psychiatry

## 2020-11-28 ENCOUNTER — Other Ambulatory Visit: Payer: Self-pay

## 2020-11-28 ENCOUNTER — Ambulatory Visit (INDEPENDENT_AMBULATORY_CARE_PROVIDER_SITE_OTHER): Payer: Commercial Managed Care - PPO | Admitting: Family Medicine

## 2020-11-28 ENCOUNTER — Encounter: Payer: Self-pay | Admitting: Family Medicine

## 2020-11-28 VITALS — BP 124/80 | HR 84 | Temp 98.3°F | Wt 237.4 lb

## 2020-11-28 DIAGNOSIS — Z7689 Persons encountering health services in other specified circumstances: Secondary | ICD-10-CM | POA: Diagnosis not present

## 2020-11-28 DIAGNOSIS — Z6841 Body Mass Index (BMI) 40.0 and over, adult: Secondary | ICD-10-CM

## 2020-11-28 LAB — T4, FREE: Free T4: 0.81 ng/dL (ref 0.60–1.60)

## 2020-11-28 LAB — TSH: TSH: 4.14 u[IU]/mL (ref 0.35–4.50)

## 2020-11-28 LAB — VITAMIN D 25 HYDROXY (VIT D DEFICIENCY, FRACTURES): VITD: 19.85 ng/mL — ABNORMAL LOW (ref 30.00–100.00)

## 2020-11-28 NOTE — Progress Notes (Signed)
Subjective:    Patient ID: Brianna Barr, female    DOB: 1996-01-14, 25 y.o.   MRN: [id number]  No chief complaint on file.   HPI Patient was seen today for f/u.  PT interested in weight loss medication options.  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  B/l plantar fasciitis making it hard to continue walking for exercise.  May have 2 meals per day, lunch at 2 am and dinner when she gets off in the am. Pt stopped eating red meat last yr and sodas.  Now drinks a Dr. Reino Kent zero once a week.  Drinking four 32 oz bottles of water per day.  Was told to cut down as it may be contributing to her incontinence.     Pt had 2 doses of Pfizer COVID-19 vaccine on 11/11/19 and 11/29/19, with booster on 08/15/20. Past Medical History:  Diagnosis Date  . Anxiety   . Asthma   . Depression   . Overactive bladder   . Stress incontinence     No Known Allergies  ROS General: Denies fever, chills, night sweats, changes in appetite  + wt gain HEENT: Denies headaches, ear pain, changes in vision, rhinorrhea, sore throat CV: Denies CP, palpitations, SOB, orthopnea Pulm: Denies SOB, cough, wheezing GI: Denies abdominal pain, nausea, vomiting, diarrhea, constipation GU: Denies dysuria, hematuria, frequency, vaginal discharge  + urinary incontinence Msk: Denies muscle cramps, joint pains Neuro: Denies weakness, numbness, tingling Skin: Denies rashes, bruising Psych: Denies depression, anxiety, hallucinations      Objective:    Blood pressure 124/80, pulse 84, temperature 98.3 F (36.8 C), temperature source Oral, weight 237 lb 6.4 oz (107.7 kg), SpO2 99 %.  Gen. Pleasant, well-nourished, in no distress, normal affect   HEENT: [place]/AT, face symmetric, conjunctiva clear, no scleral icterus, PERRLA, EOMI, nares patent without drainage Lungs: no accessory muscle use, CTAB, no wheezes or rales Cardiovascular: RRR, no m/r/g, no peripheral edema Musculoskeletal: No deformities, no cyanosis or clubbing, normal  tone Neuro:  A&Ox3, CN II-XII intact, normal gait Skin:  Warm, no lesions/ rash   Wt Readings from Last 3 Encounters:  11/28/20 237 lb 6.4 oz (107.7 kg)  10/31/20 234 lb 3.2 oz (106.2 kg)  10/23/20 231 lb (104.8 kg)    Lab Results  Component Value Date   WBC 10.2 10/03/2020   HGB 11.8 10/03/2020   HCT 37.6 10/03/2020   PLT 436 (H) 10/03/2020   GLUCOSE 84 08/18/2019   NA 140 08/18/2019   K 4.0 08/18/2019   CL 106 08/18/2019   CREATININE 0.86 08/18/2019   BUN 11 08/18/2019   CO2 24 08/18/2019   TSH 2.38 08/18/2019   HGBA1C 5.4 08/18/2019    Assessment/Plan:  Encounter for weight management -reviewed weight loss options -will likely have some difficulty given work schedule and h/o plantar fasciitis -consider medication options  Class 3 severe obesity without serious comorbidity with body mass index (BMI) of 40.0 to 44.9 in adult, unspecified obesity type (HCC)  -discussed lifestyle modifications including regular meals. - Plan: TSH, T4, free, Vitamin D, 25-hydroxy, Vitamin D, 25-hydroxy, TSH, T4, free  F/u in 1 month, sooner if needed  Abbe Amsterdam, MD

## 2020-11-28 NOTE — Patient Instructions (Signed)
Calorie Counting for Weight Loss Calories are units of energy. Your body needs a certain number of calories from food to keep going throughout the day. When you eat or drink more calories than your body needs, your body stores the extra calories mostly as fat. When you eat or drink fewer calories than your body needs, your body burns fat to get the energy it needs. Calorie counting means keeping track of how many calories you eat and drink each day. Calorie counting can be helpful if you need to lose weight. If you eat fewer calories than your body needs, you should lose weight. Ask your health care provider what a healthy weight is for you. For calorie counting to work, you will need to eat the right number of calories each day to lose a healthy amount of weight per week. A dietitian can help you figure out how many calories you need in a day and will suggest ways to reach your calorie goal.  A healthy amount of weight to lose each week is usually 1-2 lb (0.5-0.9 kg). This usually means that your daily calorie intake should be reduced by 500-750 calories.  Eating 1,200-1,500 calories a day can help most women lose weight.  Eating 1,500-1,800 calories a day can help most men lose weight. What do I need to know about calorie counting? Work with your health care provider or dietitian to determine how many calories you should get each day. To meet your daily calorie goal, you will need to:  Find out how many calories are in each food that you would like to eat. Try to do this before you eat.  Decide how much of the food you plan to eat.  Keep a food log. Do this by writing down what you ate and how many calories it had. To successfully lose weight, it is important to balance calorie counting with a healthy lifestyle that includes regular activity. Where do I find calorie information? The number of calories in a food can be found on a Nutrition Facts label. If a food does not have a Nutrition Facts  label, try to look up the calories online or ask your dietitian for help. Remember that calories are listed per serving. If you choose to have more than one serving of a food, you will have to multiply the calories per serving by the number of servings you plan to eat. For example, the label on a package of bread might say that a serving size is 1 slice and that there are 90 calories in a serving. If you eat 1 slice, you will have eaten 90 calories. If you eat 2 slices, you will have eaten 180 calories.   How do I keep a food log? After each time that you eat, record the following in your food log as soon as possible:  What you ate. Be sure to include toppings, sauces, and other extras on the food.  How much you ate. This can be measured in cups, ounces, or number of items.  How many calories were in each food and drink.  The total number of calories in the food you ate. Keep your food log near you, such as in a pocket-sized notebook or on an app or website on your mobile phone. Some programs will calculate calories for you and show you how many calories you have left to meet your daily goal. What are some portion-control tips?  Know how many calories are in a serving. This will   help you know how many servings you can have of a certain food.  Use a measuring cup to measure serving sizes. You could also try weighing out portions on a kitchen scale. With time, you will be able to estimate serving sizes for some foods.  Take time to put servings of different foods on your favorite plates or in your favorite bowls and cups so you know what a serving looks like.  Try not to eat straight from a food's packaging, such as from a bag or box. Eating straight from the package makes it hard to see how much you are eating and can lead to overeating. Put the amount you would like to eat in a cup or on a plate to make sure you are eating the right portion.  Use smaller plates, glasses, and bowls for smaller  portions and to prevent overeating.  Try not to multitask. For example, avoid watching TV or using your computer while eating. If it is time to eat, sit down at a table and enjoy your food. This will help you recognize when you are full. It will also help you be more mindful of what and how much you are eating. What are tips for following this plan? Reading food labels  Check the calorie count compared with the serving size. The serving size may be smaller than what you are used to eating.  Check the source of the calories. Try to choose foods that are high in protein, fiber, and vitamins, and low in saturated fat, trans fat, and sodium. Shopping  Read nutrition labels while you shop. This will help you make healthy decisions about which foods to buy.  Pay attention to nutrition labels for low-fat or fat-free foods. These foods sometimes have the same number of calories or more calories than the full-fat versions. They also often have added sugar, starch, or salt to make up for flavor that was removed with the fat.  Make a grocery list of lower-calorie foods and stick to it. Cooking  Try to cook your favorite foods in a healthier way. For example, try baking instead of frying.  Use low-fat dairy products. Meal planning  Use more fruits and vegetables. One-half of your plate should be fruits and vegetables.  Include lean proteins, such as chicken, turkey, and fish. Lifestyle Each week, aim to do one of the following:  150 minutes of moderate exercise, such as walking.  75 minutes of vigorous exercise, such as running. General information  Know how many calories are in the foods you eat most often. This will help you calculate calorie counts faster.  Find a way of tracking calories that works for you. Get creative. Try different apps or programs if writing down calories does not work for you. What foods should I eat?  Eat nutritious foods. It is better to have a nutritious,  high-calorie food, such as an avocado, than a food with few nutrients, such as a bag of potato chips.  Use your calories on foods and drinks that will fill you up and will not leave you hungry soon after eating. ? Examples of foods that fill you up are nuts and nut butters, vegetables, lean proteins, and high-fiber foods such as whole grains. High-fiber foods are foods with more than 5 g of fiber per serving.  Pay attention to calories in drinks. Low-calorie drinks include water and unsweetened drinks. The items listed above may not be a complete list of foods and beverages you can eat.   Contact a dietitian for more information.   What foods should I limit? Limit foods or drinks that are not good sources of vitamins, minerals, or protein or that are high in unhealthy fats. These include:  Candy.  Other sweets.  Sodas, specialty coffee drinks, alcohol, and juice. The items listed above may not be a complete list of foods and beverages you should avoid. Contact a dietitian for more information. How do I count calories when eating out?  Pay attention to portions. Often, portions are much larger when eating out. Try these tips to keep portions smaller: ? Consider sharing a meal instead of getting your own. ? If you get your own meal, eat only half of it. Before you start eating, ask for a container and put half of your meal into it. ? When available, consider ordering smaller portions from the menu instead of full portions.  Pay attention to your food and drink choices. Knowing the way food is cooked and what is included with the meal can help you eat fewer calories. ? If calories are listed on the menu, choose the lower-calorie options. ? Choose dishes that include vegetables, fruits, whole grains, low-fat dairy products, and lean proteins. ? Choose items that are boiled, broiled, grilled, or steamed. Avoid items that are buttered, battered, fried, or served with cream sauce. Items labeled as  crispy are usually fried, unless stated otherwise. ? Choose water, low-fat milk, unsweetened iced tea, or other drinks without added sugar. If you want an alcoholic beverage, choose a lower-calorie option, such as a glass of wine or light beer. ? Ask for dressings, sauces, and syrups on the side. These are usually high in calories, so you should limit the amount you eat. ? If you want a salad, choose a garden salad and ask for grilled meats. Avoid extra toppings such as bacon, cheese, or fried items. Ask for the dressing on the side, or ask for olive oil and vinegar or lemon to use as dressing.  Estimate how many servings of a food you are given. Knowing serving sizes will help you be aware of how much food you are eating at restaurants. Where to find more information  Centers for Disease Control and Prevention: www.cdc.gov  [place] Department of Agriculture: myplate.gov Summary  Calorie counting means keeping track of how many calories you eat and drink each day. If you eat fewer calories than your body needs, you should lose weight.  A healthy amount of weight to lose per week is usually 1-2 lb (0.5-0.9 kg). This usually means reducing your daily calorie intake by 500-750 calories.  The number of calories in a food can be found on a Nutrition Facts label. If a food does not have a Nutrition Facts label, try to look up the calories online or ask your dietitian for help.  Use smaller plates, glasses, and bowls for smaller portions and to prevent overeating.  Use your calories on foods and drinks that will fill you up and not leave you hungry shortly after a meal. This information is not intended to replace advice given to you by your health care provider. Make sure you discuss any questions you have with your health care provider. Document Revised: 11/24/2019 Document Reviewed: 11/24/2019 Elsevier Patient Education  2021 Elsevier Inc.  

## 2020-11-30 ENCOUNTER — Other Ambulatory Visit: Payer: Self-pay | Admitting: Family Medicine

## 2020-11-30 DIAGNOSIS — E559 Vitamin D deficiency, unspecified: Secondary | ICD-10-CM

## 2020-11-30 MED ORDER — VITAMIN D (ERGOCALCIFEROL) 1.25 MG (50000 UNIT) PO CAPS
50000.0000 [IU] | ORAL_CAPSULE | ORAL | 0 refills | Status: DC
Start: 2020-11-30 — End: 2021-05-03

## 2020-12-03 NOTE — Progress Notes (Signed)
[facility] Return Visit  SUBJECTIVE  History of Present Illness: Brianna Barr is a 25 y.o. female seen in follow-up for mixed incontinence and vaginismus. Plan at last visit was to start Myrbetriq 25mg  daily and to start pelvic floor physical therapy. Was also prescribed lidocaine jelly and vaginal valium tablets to help assist her at home dilator therapy.   Took Myrbetriq for about 5 weeks ago, saw some slight improvement of her symptoms. She had to change panty liners less and frequency improved. Urgency did not improve. Insurance is no longer covering.   Used the lidocaine jelly and vaginal valium two or three times, it helped some but was still uncomfortable with the dilator. She has physical therapy scheduled to start on 3/18.    Past Medical History: Patient  has a past medical history of Anxiety, Asthma, Depression, Overactive bladder, and Stress incontinence.   Past Surgical History: She  has a past surgical history that includes Wisdom tooth extraction.   Medications: She has a current medication list which includes the following prescription(s): albuterol, diazepam, ipratropium, mirabegron er, solifenacin, and vitamin d (ergocalciferol).   Allergies: Patient has No Known Allergies.   Social History: Patient  reports that she has never smoked. She has never used smokeless tobacco. She reports that she does not drink alcohol and does not use drugs.      OBJECTIVE     Physical Exam: Vitals:   12/05/20 0849  BP: 130/81  Pulse: 96  Weight: 237 lb (107.5 kg)  Height: 5\' 2"  (1.575 m)   Gen: No apparent distress, A&O x 3.  Detailed Urogynecologic Evaluation:  Deferred. Prior exam showed:  Tenderness with q-tip around introitus  Speculum exam deferred   Pelvic floor strength 0/V- unable to perform kegel  Pelvic floor musculature: Right levator tender, Right obturator tender, Left levator tender, Left obturator tender   ASSESSMENT AND  PLAN    Brianna Barr is a 25 y.o. with:  1. Overactive bladder   2. Levator spasm    1. OAB - Since myrbetriq is no longer covered, and she did not see any significant improvement, will change to Vesicare 5mg  daily. She will let Brianna Barr know if she has difficulty obtaining the medication.   2. Levator Spasm/ Dyspareunia - Discussed the addition of trigger point injections to levator muscles. This is a series of injections weekly for 6 weeks. Bupivacaine and kenalog combination is used. She is interested in starting this in conjunction with physical therapy that is already scheduled.   Return for trigger point injections.   25, MD  Time spent: I spent 20 minutes dedicated to the care of this patient on the date of this encounter to include pre-visit review of records, face-to-face time with the patient and post visit documentation and ordering medication/ testing.

## 2020-12-05 ENCOUNTER — Ambulatory Visit (INDEPENDENT_AMBULATORY_CARE_PROVIDER_SITE_OTHER): Payer: Commercial Managed Care - PPO | Admitting: Obstetrics and Gynecology

## 2020-12-05 ENCOUNTER — Other Ambulatory Visit: Payer: Self-pay

## 2020-12-05 ENCOUNTER — Encounter: Payer: Self-pay | Admitting: Obstetrics and Gynecology

## 2020-12-05 ENCOUNTER — Other Ambulatory Visit: Payer: Self-pay | Admitting: Obstetrics and Gynecology

## 2020-12-05 VITALS — BP 130/81 | HR 96 | Ht 62.0 in | Wt 237.0 lb

## 2020-12-05 DIAGNOSIS — M62838 Other muscle spasm: Secondary | ICD-10-CM

## 2020-12-05 DIAGNOSIS — N3281 Overactive bladder: Secondary | ICD-10-CM | POA: Diagnosis not present

## 2020-12-05 MED ORDER — SOLIFENACIN SUCCINATE 5 MG PO TABS
5.0000 mg | ORAL_TABLET | Freq: Every day | ORAL | 5 refills | Status: DC
Start: 2020-12-05 — End: 2020-12-06

## 2020-12-05 NOTE — Patient Instructions (Signed)
Will start trigger point injections into the pelvic floor muscles. This will be a series of injections weekly for 6 weeks. I will inject a combination of bupivacaine (an anesthetic) and kenalog (a steroid) into the muscles.

## 2020-12-06 MED ORDER — OXYBUTYNIN CHLORIDE ER 5 MG PO TB24: 5.0000 mg | ORAL_TABLET | Freq: Every day | ORAL | 5 refills | Status: DC

## 2020-12-06 NOTE — Telephone Encounter (Signed)
Vesicare too expensive. Sent Oxybutynin 5mg  XL to .

## 2020-12-10 ENCOUNTER — Other Ambulatory Visit: Payer: Self-pay

## 2020-12-10 ENCOUNTER — Ambulatory Visit (INDEPENDENT_AMBULATORY_CARE_PROVIDER_SITE_OTHER): Payer: 59 | Admitting: Psychiatry

## 2020-12-10 DIAGNOSIS — F411 Generalized anxiety disorder: Secondary | ICD-10-CM

## 2020-12-10 NOTE — Progress Notes (Signed)
[facility] Counselor/Therapist Progress Note  Patient ID: Brianna Barr, MRN: [id number],    Date: 12/10/2020  Time Spent:  60 minutes   9:00am to 10:00am   Treatment Type: Individual Therapy  Reported Symptoms: anxiety, depression  Mental Status Exam:  Appearance:   Well Groomed     Behavior:  Appropriate, Sharing and Motivated  Motor:  Normal  Speech/Language:   Clear and Coherent  Affect:  anxious, depressed  Mood:  anxious and depressed  Thought process:  goal directed  Thought content:    WNL  Sensory/Perceptual disturbances:    WNL  Orientation:  oriented to person, place, time/date, situation, day of week, month of year and year  Attention:  Good  Concentration:  Good and Fair  Memory:  WNL  Fund of knowledge:   Good  Insight:    Good  Judgment:   Good  Impulse Control:  Good   Risk Assessment: Danger to Self:  No Self-injurious Behavior: No Danger to Others: No Duty to Warn:no Physical Aggression / Violence:No  Access to Firearms a concern: No  Gang Involvement:No   Subjective: Patient today reporting anxiety and depression, with anxiety being the stronger symptom.  Interventions: Cognitive Behavioral Therapy and Solution-Oriented/Positive Psychology  Diagnosis:   ICD-10-CM   1. Generalized anxiety disorder  F41.1      Plan of Care: Patient not signing tx plan on computer screen due to [place].   Treatment Goals: Goals will remain on tx plan as patient works with strategies to meet her goals. Progress will be noted each session and documented in "Progress" section of Plan.  Long term goal: Reduce overalllevel, frequency, and intensity of the anxiety so that daily functioning is not impaired.  Short term goal: Increase understandingof beliefs and messages that produce anxiety, depression, or "worries".  Strategy: Identify, challenge,and replace anxious/depressive/fearful self-talk with positive, realistic, and empowering  self-talk.  Progress: Patient in today reporting anxiety and depression.  Anxiety is stronger symptom.  Still having issue with parents re: church choices "theirs vs hers etc." Still involved in meeting other people through dating app but hasn't really met anyone of interest yet. Has met a few that have not felt to be good matches for her. Anniversary date recently of her former fiance's death and she tearfully processed her thoughts, feelings, and was able to acknowledge some growth through her grief. Also some growth discussed in her patient re: wanting to meet someone for dating/relationships desires. Doesn't feel her social anxiety has been quite quite as bad recently. Recognizing and processing a lot of "what I can change and what I can't change" and states she has tended to focus more on what she can't change versus can change, and wants to begin working more on what she can change even if it's uncomfortable.  Still struggling with issues of being a single adult and still living with her parents versus being able to be out on her own, however is working towards that goal.  Worked today with her short-term goal and strategy especially regarding some of her thoughts and self talk that are anxious/depressive and trying to change them to be more reality based, positive, and empowering.  Worked on some specific examples and she is to continue this between sessions.  Encouraged patient to maintain contact with people who are supportive of her, continue work on her social anxiety strategies we have discussed in prior sessions and mention today, look for more positives versus negatives on a daily  basis, and interrupt the negative/anxious/depressive ways of viewing herself and begin seeing herself in a more positive, affirming way.  Goal review and progress/challenges noted with patient.  Next appointment within 2 to 3 weeks.   Shanon Ace, LCSW

## 2020-12-24 ENCOUNTER — Other Ambulatory Visit: Payer: Self-pay

## 2020-12-24 ENCOUNTER — Ambulatory Visit (INDEPENDENT_AMBULATORY_CARE_PROVIDER_SITE_OTHER): Payer: 59 | Admitting: Psychiatry

## 2020-12-24 DIAGNOSIS — F411 Generalized anxiety disorder: Secondary | ICD-10-CM | POA: Diagnosis not present

## 2020-12-24 NOTE — Progress Notes (Signed)
[facility] Counselor/Therapist Progress Note  Patient ID: Brianna Barr, MRN: [id number],    Date: 12/24/2020  Time Spent: 60 minutes   9:00am to 10:00am  Treatment Type: Individual Therapy  Reported Symptoms: anxiety, depression , "anxiety is the strongest symptom"   Mental Status Exam:  Appearance:   Casual     Behavior:  Appropriate, Sharing and Motivated  Motor:  Normal  Speech/Language:   Clear and Coherent  Affect:  anxious  Mood:  anxious and depressed  Thought process:  normal  Thought content:    WNL  Sensory/Perceptual disturbances:    WNL  Orientation:  oriented to person, place, time/date, situation, day of week, month of year and year  Attention:  Good  Concentration:  Fair  Memory:  WNL  Fund of knowledge:   Good  Insight:    Good  Judgment:   Good  Impulse Control:  Good   Risk Assessment: Danger to Self:  No Self-injurious Behavior: No Danger to Others: No Duty to Warn:no Physical Aggression / Violence:No  Access to Firearms a concern: No  Gang Involvement:No   Subjective: Patient today reporting anxiety and depression, with anxiety being the stronger symptom.  Interventions: Cognitive Behavioral Therapy and Solution-Oriented/Positive Psychology  Diagnosis:   ICD-10-CM   1. Generalized anxiety disorder  F41.1      Plan of Care: Patient not signing tx plan on computer screen due to COVID.   Treatment Goals: Goals will remain on tx plan as patient works with strategies to meet her goals. Progress will be noted each session and documented in "Progress" section of Plan.  Long term goal: Reduce overalllevel, frequency, and intensity of the anxiety so that daily functioning is not impaired.  Short term goal: Increase understandingof beliefs and messages that produce anxiety, depression, or "worries".  Strategy: Identify, challenge,and replace anxious/depressive/fearful self-talk with positive, realistic, and empowering  self-talk.  Progress: Patient in today reporting anxiety as her stronger symptom, and also some depression..  Feels that she has "done the right thing" by backing of the dating sites, and feels she has managed her finance's anniversary of his death as well as she could expect. Feels her health anxiety has worsened and she tearfully began talking about it today. More problems have arisen healthwise including "GI issues, pelvic floor, bladder, new specialists, new medications, new recommendation can be overwhelming".  Needed most of session today to process a lot of anxieties and fears. Starts a procedure late this week involving multiple shots by a "uro-gynecologist".  Struggling with the pain and the not being able to control some bodily functions, which patient admits can be embarrassment. Following up from last session, she states she is trying to look more for what might go well and is hopeful upcoming procedure will help, although it has some limitations per her doctor. Processes more about her issues of being single adult and still living with her parents and not being out on her own yet. With her health anxieties, we worked with her long and short term goals and strategy to help tag certain recurring anxious thoughts, interrupting them and replacing with more positive and reality-based thoughts that do not support her anxiety.  Also encouraged patient to be in touch with supportive friends, setting appropriate limits and boundaries within family, continued work with her social anxiety strategies, interrupting the negative/anxious/depressive ways she views and defines herself at times, and staying in the present focusing on what she can control versus cannot, and practicing  positive self talk.   Goal review and progress/challenges noted with patient.  Next appointment within 2 to 3 weeks.  Mathis Fare, LCSW

## 2020-12-27 NOTE — Progress Notes (Signed)
[facility] Return Visit  SUBJECTIVE  History of Present Illness: Brianna Barr is a 25 y.o. female seen in follow-up for incontinence, vaginismus and levator spasm.  Plan was to start vesicare 5mg  daily at last visit- was not able to get this medication and oxybutynin 5mg  was prescribed instead. Is now urinating 8-10 times per day, which has been reduced from previously. Has occasional leakage still. Denies dry mouth, dry eyes and constipation.    Past Medical History: Patient  has a past medical history of Anxiety, Asthma, Depression, Overactive bladder, and Stress incontinence.   Past Surgical History: She  has a past surgical history that includes Wisdom tooth extraction.   Medications: She has a current medication list which includes the following prescription(s): albuterol, diazepam, ipratropium, oxybutynin, and vitamin d (ergocalciferol).   Allergies: Patient has No Known Allergies.   Social History: Patient  reports that she has never smoked. She has never used smokeless tobacco. She reports that she does not drink alcohol and does not use drugs.      OBJECTIVE     Physical Exam: Vitals:   12/28/20 0934  BP: 120/82  Pulse: (!) 102  Weight: 237 lb (107.5 kg)   Gen: No apparent distress, A&O x 3.   Trigger point injection:  Indication(s): Levator spasm.   Informed Consent:  The alternatives, risks and benefits of the procedure were explained to the patient. Risks including, but not limited to discomfort, pain, bleeding, infection, injury to nearby structures, inability to perform the procedure, failure of the procedure were discussed.  All questions were answered and the patient elected to proceed.  Procedure:   The patient was positioned in dorsal lithotomy position.  The vaginal tissues were prepped first with lidocaine jelly 2% then after a few minutes with Hibiclens solution.  An injection of 10cc of a mixture of 90% anesthetic (0.25%  Bupivacaine) and 10% 40mg /ml Triamcinolone acetomide (Kenalog) was performed in multiple in the bilateral levator muscles, a total of 6 injection sites. Pressure was held over bleeding areas until good hemostasis was achieved.   The patient tolerated the procedure well with no apparent complications.   ASSESSMENT AND PLAN    Ms. Hohler is a 25 y.o. with:  1. Overactive bladder   2. Levator spasm     1. OAB - Increased oxybutynin to 10mg  daily. She can take two of the 5mg  tablets until she needs a refill.   2. Levator spasm - 1st set of trigger point injections performed today, will return next week for second series.  - Has PT scheduled to start for later this month   , MD  Time spent: I spent 15 minutes dedicated to the care of this patient on the date of this encounter to include pre-visit review of records, face-to-face time with the patient and post visit documentation and ordering medication/ testing. Additional time was spent for the procedure.

## 2020-12-28 ENCOUNTER — Ambulatory Visit (INDEPENDENT_AMBULATORY_CARE_PROVIDER_SITE_OTHER): Payer: Commercial Managed Care - PPO | Admitting: Obstetrics and Gynecology

## 2020-12-28 ENCOUNTER — Encounter: Payer: Self-pay | Admitting: Obstetrics and Gynecology

## 2020-12-28 ENCOUNTER — Other Ambulatory Visit: Payer: Self-pay

## 2020-12-28 VITALS — BP 120/82 | HR 102 | Wt 237.0 lb

## 2020-12-28 DIAGNOSIS — N3281 Overactive bladder: Secondary | ICD-10-CM | POA: Diagnosis not present

## 2020-12-28 DIAGNOSIS — M62838 Other muscle spasm: Secondary | ICD-10-CM | POA: Diagnosis not present

## 2020-12-28 MED ORDER — OXYBUTYNIN CHLORIDE ER 10 MG PO TB24: 10.0000 mg | ORAL_TABLET | Freq: Every day | ORAL | 5 refills | Status: DC

## 2021-01-01 MED ORDER — BUPIVACAINE HCL 0.25 % IJ SOLN
9.0000 mL | Freq: Once | INTRAMUSCULAR | Status: DC
Start: 2021-01-01 — End: 2021-01-04

## 2021-01-01 MED ORDER — LIDOCAINE HCL URETHRAL/MUCOSAL 2 % EX GEL
1.0000 "application " | Freq: Once | CUTANEOUS | Status: AC
  Administered 2021-01-04: 1 via TOPICAL

## 2021-01-01 MED ORDER — TRIAMCINOLONE ACETONIDE 40 MG/ML IJ SUSP
40.0000 mg | Freq: Once | INTRAMUSCULAR | Status: AC
  Administered 2020-12-28: 40 mg via INTRAMUSCULAR

## 2021-01-01 NOTE — Addendum Note (Signed)
Addended by: Marguerita Beards on: 01/01/2021 09:09 AM   Modules accepted: Orders

## 2021-01-03 NOTE — Progress Notes (Signed)
Ms. Brianna Barr is a 25 y.o. female who presents for levator trigger point injection, series #2.   Vitals:   01/04/21 1134  BP: 130/87  Pulse: 96    Indication(s): Levator spasm.   Informed Consent:  The alternatives, risks and benefits of the procedure were explained to the patient. Risks including, but not limited to discomfort, pain, bleeding, infection, injury to nearby structures, inability to perform the procedure, failure of the procedure were discussed.  All questions were answered and the patient elected to proceed.  Procedure:   The patient was positioned in dorsal lithotomy position.  The vaginal tissues were prepped with 2% lidocaine jelly and then Hibiclens solution.  An injection of 10cc of a mixture of 90% anesthetic (0.25% Bupivacaine) and 10% 40mg /ml Triamcinolone acetomide (Kenalog) was performed in multiple in the bilateral levator muscles, a total of 6 injection sites.  Pressure was held over bleeding areas until good hemostasis was achieved.   The patient tolerated the procedure well with no apparent complications.  , MD

## 2021-01-04 ENCOUNTER — Ambulatory Visit (INDEPENDENT_AMBULATORY_CARE_PROVIDER_SITE_OTHER): Payer: Commercial Managed Care - PPO | Admitting: Obstetrics and Gynecology

## 2021-01-04 ENCOUNTER — Ambulatory Visit (INDEPENDENT_AMBULATORY_CARE_PROVIDER_SITE_OTHER): Payer: Commercial Managed Care - PPO | Admitting: Family Medicine

## 2021-01-04 ENCOUNTER — Encounter: Payer: Self-pay | Admitting: Family Medicine

## 2021-01-04 ENCOUNTER — Encounter: Payer: Self-pay | Admitting: Obstetrics and Gynecology

## 2021-01-04 ENCOUNTER — Other Ambulatory Visit: Payer: Self-pay

## 2021-01-04 VITALS — BP 118/82 | HR 94 | Temp 98.5°F | Ht 62.0 in | Wt 240.0 lb

## 2021-01-04 VITALS — BP 130/87 | HR 96 | Wt 240.0 lb

## 2021-01-04 DIAGNOSIS — Z6841 Body Mass Index (BMI) 40.0 and over, adult: Secondary | ICD-10-CM

## 2021-01-04 DIAGNOSIS — M62838 Other muscle spasm: Secondary | ICD-10-CM | POA: Diagnosis not present

## 2021-01-04 MED ORDER — BUPIVACAINE HCL (PF) 0.25 % IJ SOLN
9.0000 mL | Freq: Once | INTRAMUSCULAR | Status: AC
  Administered 2020-12-28: 9 mL

## 2021-01-04 MED ORDER — PEN NEEDLES 32G X 6 MM MISC: 1 refills | Status: DC

## 2021-01-04 MED ORDER — BUPIVACAINE HCL 0.25 % IJ SOLN
9.0000 mL | Freq: Once | INTRAMUSCULAR | Status: DC
Start: 2021-01-04 — End: 2021-01-04

## 2021-01-04 MED ORDER — LIDOCAINE HCL URETHRAL/MUCOSAL 2 % EX GEL
1.0000 "application " | Freq: Once | CUTANEOUS | Status: AC
  Administered 2021-01-04: 1 via TOPICAL

## 2021-01-04 MED ORDER — LIRAGLUTIDE -WEIGHT MANAGEMENT 18 MG/3ML ~~LOC~~ SOPN: PEN_INJECTOR | SUBCUTANEOUS | 1 refills | Status: DC

## 2021-01-04 MED ORDER — TRIAMCINOLONE ACETONIDE 40 MG/ML IJ SUSP
40.0000 mg | Freq: Once | INTRAMUSCULAR | Status: AC
  Administered 2021-01-04: 40 mg via INTRAMUSCULAR

## 2021-01-04 MED ORDER — BUPIVACAINE HCL (PF) 0.25 % IJ SOLN
9.0000 mL | Freq: Once | INTRAMUSCULAR | Status: AC
  Administered 2021-01-04: 9 mL

## 2021-01-04 NOTE — Progress Notes (Signed)
Subjective:    Patient ID: Brianna Barr, female    DOB: 24-Feb-1996, 25 y.o.   MRN: [id number]  Chief Complaint  Patient presents with  . Weight Check    Pt states she has not lost any weight since last OV; and fatigue remains about the same; pt is taking the Vit D as prescribed     HPI Patient was seen today for follow-up on weight loss.  Patient has been eating 4 meals/ day x3 weeks.  Trying to eat last meal at 6 AM while still at work, then going to bed at 9 AM.  Cooking at home.  Trying to increase physical activity since the weather is getting warmer.  Drinking more water.  Feeling frustrated 2/2 weight gain.  Having to take anti-inflammatories for plantar fasciitis.  Interested in starting medication for weight loss.  Denies history of thyroid disease or pancreatitis.  Past Medical History:  Diagnosis Date  . Anxiety   . Asthma   . Depression   . Overactive bladder   . Stress incontinence     No Known Allergies  ROS General: Denies fever, chills, night sweats, changes in weight, changes in appetite + weight gain HEENT: Denies headaches, ear pain, changes in vision, rhinorrhea, sore throat CV: Denies CP, palpitations, SOB, orthopnea Pulm: Denies SOB, cough, wheezing GI: Denies abdominal pain, nausea, vomiting, diarrhea, constipation GU: Denies dysuria, hematuria, frequency, vaginal discharge Msk: Denies muscle cramps, joint pains Neuro: Denies weakness, numbness, tingling Skin: Denies rashes, bruising Psych: Denies depression, anxiety, hallucinations     Objective:    Blood pressure 118/82, pulse 94, temperature 98.5 F (36.9 C), temperature source Oral, height 5\' 2"  (1.575 m), weight 240 lb (108.9 kg), SpO2 98 %.  Gen. Pleasant, well-nourished, in no distress, normal affect   HEENT: [place]/AT, face symmetric, conjunctiva clear, no scleral icterus, PERRLA, EOMI, nares patent without drainage Lungs: no accessory muscle use, CTAB, no wheezes or rales Cardiovascular:  RRR, no m/r/g, no peripheral edema Musculoskeletal: No deformities, no cyanosis or clubbing, normal tone Neuro:  A&Ox3, CN II-XII intact, normal gait Skin:  Warm, no lesions/ rash   Wt Readings from Last 3 Encounters:  01/04/21 240 lb (108.9 kg)  12/28/20 237 lb (107.5 kg)  12/05/20 237 lb (107.5 kg)    Lab Results  Component Value Date   WBC 10.2 10/03/2020   HGB 11.8 10/03/2020   HCT 37.6 10/03/2020   PLT 436 (H) 10/03/2020   GLUCOSE 84 08/18/2019   NA 140 08/18/2019   K 4.0 08/18/2019   CL 106 08/18/2019   CREATININE 0.86 08/18/2019   BUN 11 08/18/2019   CO2 24 08/18/2019   TSH 4.14 11/28/2020   HGBA1C 5.4 08/18/2019    Assessment/Plan:  Class 3 severe obesity due to excess calories with serious comorbidity and body mass index (BMI) of 40.0 to 44.9 in adult (HCC)  -Continue lifestyle modifications including increasing physical activity -Continue ergocalciferol -Given handouts -We will start liraglutide 0.6 mg daily x 1 week and increase by 0.6 mg weekly as tolerated with a max of 3 mg/day. -If liraglutide cost prohibitive discussed other medications options -Given precautions - Plan: Liraglutide -Weight Management 18 MG/3ML SOPN, Insulin Pen Needle (PEN NEEDLES) 32G X 6 MM MISC  F/u in 4wks  08/20/2019, MD

## 2021-01-04 NOTE — Patient Instructions (Signed)
Liraglutide injection (Weight Management) What is this medicine? LIRAGLUTIDE (LIR a GLOO tide) is used to help people lose weight and maintain weight loss. It is used with a reduced-calorie diet and exercise. This medicine may be used for other purposes; ask your health care provider or pharmacist if you have questions. COMMON BRAND NAME(S): Saxenda What should I tell my health care provider before I take this medicine? They need to know if you have any of these conditions:  endocrine tumors (MEN 2) or if someone in your family had these tumors  gallbladder disease  high cholesterol  history of alcohol abuse problem  history of pancreatitis  kidney disease or if you are on dialysis  liver disease  previous swelling of the tongue, face, or lips with difficulty breathing, difficulty swallowing, hoarseness, or tightening of the throat  stomach problems  suicidal thoughts, plans, or attempt; a previous suicide attempt by you or a family member  thyroid cancer or if someone in your family had thyroid cancer  an unusual or allergic reaction to liraglutide, other medicines, foods, dyes, or preservatives  pregnant or trying to get pregnant  breast-feeding How should I use this medicine? This medicine is for injection under the skin of your upper leg, stomach area, or upper arm. You will be taught how to prepare and give this medicine. Use exactly as directed. Take your medicine at regular intervals. Do not take it more often than directed. This medicine comes with INSTRUCTIONS FOR USE. Ask your pharmacist for directions on how to use this medicine. Read the information carefully. Talk to your pharmacist or health care provider if you have questions. It is important that you put your used needles and syringes in a special sharps container. Do not put them in a trash can. If you do not have a sharps container, call your pharmacist or healthcare provider to get one. A special MedGuide  will be given to you by the pharmacist with each prescription and refill. Be sure to read this information carefully each time. Talk to your health care provider about the use of this medicine in children. While it may be prescribed for children as young as 12 years of age for selected conditions, precautions do apply. Overdosage: If you think you have taken too much of this medicine contact a poison control center or emergency room at once. NOTE: This medicine is only for you. Do not share this medicine with others. What if I miss a dose? If you miss a dose, take it as soon as you can. If it is almost time for your next dose, take only that dose. Do not take double or extra doses. If you miss your dose for 3 days or more, call your doctor or health care professional to talk about how to restart this medicine. What may interact with this medicine?  insulin and other medicines for diabetes This list may not describe all possible interactions. Give your health care provider a list of all the medicines, herbs, non-prescription drugs, or dietary supplements you use. Also tell them if you smoke, drink alcohol, or use illegal drugs. Some items may interact with your medicine. What should I watch for while using this medicine? Visit your doctor or health care professional for regular checks on your progress. Drink plenty of fluids while taking this medicine. Check with your doctor or health care professional if you get an attack of severe diarrhea, nausea, and vomiting. The loss of too much body fluid can make it   dangerous for you to take this medicine. This medicine may affect blood sugar levels. Ask your healthcare provider if changes in diet or medicines are needed if you have diabetes. Patients and their families should watch out for worsening depression or thoughts of suicide. Also watch out for sudden changes in feelings such as feeling anxious, agitated, panicky, irritable, hostile, aggressive,  impulsive, severely restless, overly excited and hyperactive, or not being able to sleep. If this happens, especially at the beginning of treatment or after a change in dose, call your health care professional. Women should inform their health care provider if they wish to become pregnant or think they might be pregnant. Losing weight while pregnant is not advised and may cause harm to the unborn child. Talk to your health care provider for more information. What side effects may I notice from receiving this medicine? Side effects that you should report to your doctor or health care professional as soon as possible:  allergic reactions like skin rash, itching or hives, swelling of the face, lips, or tongue  breathing problems  diarrhea that continues or is severe  lump or swelling on the neck  severe nausea  signs and symptoms of infection like fever or chills; cough; sore throat; pain or trouble passing urine  signs and symptoms of low blood sugar such as feeling anxious; confusion; dizziness; increased hunger; unusually weak or tired; increased sweating; shakiness; cold, clammy skin; irritable; headache; blurred vision; fast heartbeat; loss of consciousness  signs and symptoms of kidney injury like trouble passing urine or change in the amount of urine  trouble swallowing  unusual stomach upset or pain  vomiting Side effects that usually do not require medical attention (report to your doctor or health care professional if they continue or are bothersome):  constipation  decreased appetite  diarrhea  fatigue  headache  nausea  pain, redness, or irritation at site where injected  stomach upset  stuffy or runny nose This list may not describe all possible side effects. Call your doctor for medical advice about side effects. You may report side effects to FDA at 1-800-FDA-1088. Where should I keep my medicine? Keep out of the reach of children. Store unopened pen in a  refrigerator between 2 and 8 degrees C (36 and 46 degrees F). Do not freeze or use if the medicine has been frozen. Protect from light and excessive heat. After you first use the pen, it can be stored at room temperature between 15 and 30 degrees C (59 and 86 degrees F) or in a refrigerator. Throw away your used pen after 30 days or after the expiration date, whichever comes first. Do not store your pen with the needle attached. If the needle is left on, medicine may leak from the pen. NOTE: This sheet is a summary. It may not cover all possible information. If you have questions about this medicine, talk to your doctor, pharmacist, or health care provider.  2021 Elsevier/Gold Standard (2019-10-06 13:56:25)  Exercising to Lose Weight Exercise is structured, repetitive physical activity to improve fitness and health. Getting regular exercise is important for everyone. It is especially important if you are overweight. Being overweight increases your risk of heart disease, stroke, diabetes, high blood pressure, and several types of cancer. Reducing your calorie intake and exercising can help you lose weight. Exercise is usually categorized as moderate or vigorous intensity. To lose weight, most people need to do a certain amount of moderate-intensity or vigorous-intensity exercise each week. Moderate-intensity exercise  Moderate-intensity exercise is any activity that gets you moving enough to burn at least three times more energy (calories) than if you were sitting. Examples of moderate exercise include:  Walking a mile in 15 minutes.  Doing light yard work.  Biking at an easy pace. Most people should get at least 150 minutes (2 hours and 30 minutes) a week of moderate-intensity exercise to maintain their body weight.   Vigorous-intensity exercise Vigorous-intensity exercise is any activity that gets you moving enough to burn at least six times more calories than if you were sitting. When you  exercise at this intensity, you should be working hard enough that you are not able to carry on a conversation. Examples of vigorous exercise include:  Running.  Playing a team sport, such as football, basketball, and soccer.  Jumping rope. Most people should get at least 75 minutes (1 hour and 15 minutes) a week of vigorous-intensity exercise to maintain their body weight. How can exercise affect me? When you exercise enough to burn more calories than you eat, you lose weight. Exercise also reduces body fat and builds muscle. The more muscle you have, the more calories you burn. Exercise also:  Improves mood.  Reduces stress and tension.  Improves your overall fitness, flexibility, and endurance.  Increases bone strength. The amount of exercise you need to lose weight depends on:  Your age.  The type of exercise.  Any health conditions you have.  Your overall physical ability. Talk to your health care provider about how much exercise you need and what types of activities are safe for you. What actions can I take to lose weight? Nutrition  Make changes to your diet as told by your health care provider or diet and nutrition specialist (dietitian). This may include: ? Eating fewer calories. ? Eating more protein. ? Eating less unhealthy fats. ? Eating a diet that includes fresh fruits and vegetables, whole grains, low-fat dairy products, and lean protein. ? Avoiding foods with added fat, salt, and sugar.  Drink plenty of water while you exercise to prevent dehydration or heat stroke.   Activity  Choose an activity that you enjoy and set realistic goals. Your health care provider can help you make an exercise plan that works for you.  Exercise at a moderate or vigorous intensity most days of the week. ? The intensity of exercise may vary from person to person. You can tell how intense a workout is for you by paying attention to your breathing and heartbeat. Most people will  notice their breathing and heartbeat get faster with more intense exercise.  Do resistance training twice each week, such as: ? Push-ups. ? Sit-ups. ? Lifting weights. ? Using resistance bands.  Getting short amounts of exercise can be just as helpful as long structured periods of exercise. If you have trouble finding time to exercise, try to include exercise in your daily routine. ? Get up, stretch, and walk around every 30 minutes throughout the day. ? Go for a walk during your lunch break. ? Park your car farther away from your destination. ? If you take public transportation, get off one stop early and walk the rest of the way. ? Make phone calls while standing up and walking around. ? Take the stairs instead of elevators or escalators.  Wear comfortable clothes and shoes with good support.  Do not exercise so much that you hurt yourself, feel dizzy, or get very short of breath. Where to find more information  [place]  Department of Health and Human Services: ThisPath.fi  Centers for Disease Control and Prevention (CDC): FootballExhibition.com.br Contact a health care provider:  Before starting a new exercise program.  If you have questions or concerns about your weight.  If you have a medical problem that keeps you from exercising. Get help right away if you have any of the following while exercising:  Injury.  Dizziness.  Difficulty breathing or shortness of breath that does not go away when you stop exercising.  Chest pain.  Rapid heartbeat. Summary  Being overweight increases your risk of heart disease, stroke, diabetes, high blood pressure, and several types of cancer.  Losing weight happens when you burn more calories than you eat.  Reducing the amount of calories you eat in addition to getting regular moderate or vigorous exercise each week helps you lose weight. This information is not intended to replace advice given to you by your health care provider. Make sure you  discuss any questions you have with your health care provider. Document Revised: 02/09/2020 Document Reviewed: 02/09/2020 Elsevier Patient Education  2021 Elsevier Inc.  Preventing Unhealthy [employer] Gain, Adult Staying at a healthy weight is important to your overall health. When fat builds up in your body, you may become overweight or obese. Being overweight or obese increases your risk of developing certain health problems, such as heart disease, diabetes, sleeping problems, joint problems, and some types of cancer. Unhealthy weight gain is often the result of making unhealthy food choices or not getting enough exercise. You can make changes to your lifestyle to prevent obesity and stay as healthy as possible. What nutrition changes can be made?  Eat only as much as your body needs. To do this: ? Pay attention to signs that you are hungry or full. Stop eating as soon as you feel full. ? If you feel hungry, try drinking water first before eating. Drink enough water so your urine is clear or pale yellow. ? Eat smaller portions. Pay attention to portion sizes when eating out. ? Look at serving sizes on food labels. Most foods contain more than one serving per container. ? Eat the recommended number of calories for your gender and activity level. For most active people, a daily total of 2,000 calories is appropriate. If you are trying to lose weight or are not very active, you may need to eat fewer calories. Talk with your health care provider or a diet and nutrition specialist (dietitian) about how many calories you need each day.  Choose healthy foods, such as: ? Fruits and vegetables. At each meal, try to fill at least half of your plate with fruits and vegetables. ? Whole grains, such as whole-wheat bread, brown rice, and quinoa. ? Lean meats, such as chicken or fish. ? Other healthy proteins, such as beans, eggs, or tofu. ? Healthy fats, such as nuts, seeds, fatty fish, and olive  oil. ? Low-fat or fat-free dairy products.  Check food labels, and avoid food and drinks that: ? Are high in calories. ? Have added sugar. ? Are high in sodium. ? Have saturated fats or trans fats.  Cook foods in healthier ways, such as by baking, broiling, or grilling.  Make a meal plan for the week, and shop with a grocery list to help you stay on track with your purchases. Try to avoid going to the grocery store when you are hungry.  When grocery shopping, try to shop around the outside of the store first, where the fresh foods  are. Doing this helps you to avoid prepackaged foods, which can be high in sugar, salt (sodium), and fat.   What lifestyle changes can be made?  Exercise for 30 or more minutes on 5 or more days each week. Exercising may include brisk walking, yard work, biking, running, swimming, and team sports like basketball and soccer. Ask your health care provider which exercises are safe for you.  Do muscle-strengthening activities, such as lifting weights or using resistance bands, on 2 or more days a week.  Do not use any products that contain nicotine or tobacco, such as cigarettes and e-cigarettes. If you need help quitting, ask your health care provider.  Limit alcohol intake to no more than 1 drink a day for nonpregnant women and 2 drinks a day for men. One drink equals 12 oz of beer, 5 oz of wine, or 1 oz of hard liquor.  Try to get 7-9 hours of sleep each night.   What other changes can be made?  Keep a food and activity journal to keep track of: ? What you ate and how many calories you had. Remember to count the calories in sauces, dressings, and side dishes. ? Whether you were active, and what exercises you did. ? Your calorie, weight, and activity goals.  Check your weight regularly. Track any changes. If you notice you have gained weight, make changes to your diet or activity routine.  Avoid taking weight-loss medicines or supplements. Talk to your  health care provider before starting any new medicine or supplement.  Talk to your health care provider before trying any new diet or exercise plan. Why are these changes important? Eating healthy, staying active, and having healthy habits can help you to prevent obesity. Those changes also:  Help you manage stress and emotions.  Help you connect with friends and family.  Improve your self-esteem.  Improve your sleep.  Prevent long-term health problems. What can happen if changes are not made? Being obese or overweight can cause you to develop joint or bone problems, which can make it hard for you to stay active or do activities you enjoy. Being obese or overweight also puts stress on your heart and lungs and can lead to health problems like diabetes, heart disease, and some cancers. Where to find more information Talk with your health care provider or a dietitian about healthy eating and healthy lifestyle choices. You may also find information from:  [place] Department of Agriculture, MyPlate: https://ball-collins.biz/  American Heart Association: www.heart.org  Centers for Disease Control and Prevention: FootballExhibition.com.br Summary  Staying at a healthy weight is important to your overall health. It helps you to prevent certain diseases and health problems, such as heart disease, diabetes, joint problems, sleep disorders, and some types of cancer.  Being obese or overweight can cause you to develop joint or bone problems, which can make it hard for you to stay active or do activities you enjoy.  You can prevent unhealthy weight gain by eating a healthy diet, exercising regularly, not smoking, limiting alcohol, and getting enough sleep.  Talk with your health care provider or a dietitian for guidance about healthy eating and healthy lifestyle choices. This information is not intended to replace advice given to you by your health care provider. Make sure you discuss any questions you have with  your health care provider. Document Revised: 02/09/2020 Document Reviewed: 02/09/2020 Elsevier Patient Education  2021 [employer].

## 2021-01-04 NOTE — Addendum Note (Signed)
Addended by: Marguerita Beards on: 01/04/2021 12:02 PM   Modules accepted: Orders

## 2021-01-07 ENCOUNTER — Other Ambulatory Visit: Payer: Self-pay

## 2021-01-07 ENCOUNTER — Ambulatory Visit (INDEPENDENT_AMBULATORY_CARE_PROVIDER_SITE_OTHER): Payer: 59 | Admitting: Psychiatry

## 2021-01-07 DIAGNOSIS — F411 Generalized anxiety disorder: Secondary | ICD-10-CM | POA: Diagnosis not present

## 2021-01-07 NOTE — Progress Notes (Signed)
[facility] Counselor/Therapist Progress Note  Patient ID: Brianna Barr, MRN: [id number],    Date: 01/07/2021  Time Spent: 60 minutes    9:00am to 10:00am  Treatment Type: Individual Therapy  Reported Symptoms: anxiety, depression, stressed re: health concerns and treatments  Mental Status Exam:  Appearance:   Casual     Behavior:  Appropriate, Sharing and Motivated  Motor:  Normal  Speech/Language:   Clear and Coherent  Affect:  anxious  Mood:  anxious  Thought process:  goal directed  Thought content:    WNL  Sensory/Perceptual disturbances:    WNL  Orientation:  oriented to person, place, time/date, situation, day of week, month of year and year  Attention:  Good  Concentration:  Fair  Memory:  WNL  Fund of knowledge:   Good  Insight:    Good  Judgment:   Good  Impulse Control:  Good   Risk Assessment: Danger to Self:  No Self-injurious Behavior: No Danger to Others: No Duty to Warn:no Physical Aggression / Violence:No  Access to Firearms a concern: No  Gang Involvement:No   Subjective: Patient today reports anxiety and depression. "I've had a lot of continued health anxiety and am trying some new treatment and is very stressful.    Interventions: Cognitive Behavioral Therapy and Solution-Oriented/Positive Psychology  Diagnosis:   ICD-10-CM   1. Generalized anxiety disorder  F41.1      Plan of Care: Patient not signing tx plan on computer screen due to COVID.   Treatment Goals: Goals will remain on tx plan as patient works with strategies to meet her goals. Progress will be noted each session and documented in "Progress" section of Plan.  Long term goal: Reduce overalllevel, frequency, and intensity of the anxiety so that daily functioning is not impaired.  Short term goal: Increase understandingof beliefs and messages that produce anxiety, depression, or "worries".  Strategy: Identify, challenge,and replace  anxious/depressive/fearful self-talk with positive, realistic, and empowering self-talk.  Progress: Patient in today reporting anxiety and stress due to personal, family, and ongoing health concerns. Has been trying new treatment and unsure yet as to whether it will prove to be helpful. Discussed the emotional and physical impact of her current health issues related to" bladder, bowel, and pelvic floor concerns."  New procedure has been quite painful and that has been difficult. "Symptoms are embarrassing at times and it's just a lot to deal with, wonder about the future" and patient was able to process these thoughts and feelings including her fears, doubt, anxieties. Reviewed history of 2 incidents in her past that may or may not relate to current circumstances per doctor. (Not all details included in this note due to patient privacy needs.)  Still hoping to find a place to live on her own versus with parents but "not actively pursuing it at the moment."  Trying to stay in the present more and focus on what she can control, and not assuming negative outcomes. Worked with her short term goal and strategy in tx plan above to better understand beliefs and messages behind her anxiety and depression and work to replace the anxious/depressive thoughts with more hopeful, realistic, and empowering thoughts. Patient encouraged in her contact with her support network of friends, interrupting the anxious/depressive views with which she defines herself at times, practice positive self-talk, set and keep healthy boundaries with family as needed, practice strategies from sessions with her social anxiety, and realize her strength and some resiliency that she is  showing as she work of difficult issues in therapy sessions.   Goal review and progress/challenges noted with patient.  Next appointment within 2 to 3 weeks.  Mathis Fare, LCSW

## 2021-01-09 NOTE — Progress Notes (Signed)
Ms. Karris Deangelo is a 25 y.o. female who presents for levator trigger point injection, series #3.   Vitals:   01/11/21 1008  BP: 122/82  Pulse: 88    Indication(s): Levator spasm.   Informed Consent:  The alternatives, risks and benefits of the procedure were explained to the patient. Risks including, but not limited to discomfort, pain, bleeding, infection, injury to nearby structures, inability to perform the procedure, failure of the procedure were discussed.  All questions were answered and the patient elected to proceed.  Procedure:   The patient was positioned in dorsal lithotomy position.  The vaginal tissues were prepped with 2% lidocaine jelly and then Hibiclens solution.  An injection of 10cc of a mixture of 90% anesthetic (0.25% Bupivacaine) and 10% 40mg /ml Triamcinolone acetomide (Kenalog) was performed in multiple in the bilateral levator muscles, a total of 5 injection sites.  Pressure was held over bleeding areas until good hemostasis was achieved.   The patient tolerated the procedure well with no apparent complications.  , MD

## 2021-01-11 ENCOUNTER — Encounter: Payer: Self-pay | Admitting: Physical Therapy

## 2021-01-11 ENCOUNTER — Other Ambulatory Visit: Payer: Self-pay

## 2021-01-11 ENCOUNTER — Encounter: Payer: Self-pay | Admitting: Obstetrics and Gynecology

## 2021-01-11 ENCOUNTER — Ambulatory Visit (INDEPENDENT_AMBULATORY_CARE_PROVIDER_SITE_OTHER): Payer: Commercial Managed Care - PPO | Admitting: Obstetrics and Gynecology

## 2021-01-11 ENCOUNTER — Ambulatory Visit: Payer: 59 | Attending: Obstetrics and Gynecology | Admitting: Physical Therapy

## 2021-01-11 VITALS — BP 122/82 | HR 88 | Ht 62.0 in | Wt 240.0 lb

## 2021-01-11 DIAGNOSIS — N3941 Urge incontinence: Secondary | ICD-10-CM | POA: Insufficient documentation

## 2021-01-11 DIAGNOSIS — N942 Vaginismus: Secondary | ICD-10-CM | POA: Diagnosis present

## 2021-01-11 DIAGNOSIS — M62838 Other muscle spasm: Secondary | ICD-10-CM | POA: Diagnosis not present

## 2021-01-11 DIAGNOSIS — R279 Unspecified lack of coordination: Secondary | ICD-10-CM | POA: Diagnosis present

## 2021-01-11 DIAGNOSIS — R252 Cramp and spasm: Secondary | ICD-10-CM | POA: Diagnosis not present

## 2021-01-11 MED ORDER — BUPIVACAINE HCL (PF) 0.25 % IJ SOLN
9.0000 mL | Freq: Once | INTRAMUSCULAR | Status: AC
  Administered 2021-01-11: 9 mL

## 2021-01-11 MED ORDER — TRIAMCINOLONE ACETONIDE 40 MG/ML IJ SUSP
40.0000 mg | Freq: Once | INTRAMUSCULAR | Status: AC
  Administered 2021-01-11: 40 mg

## 2021-01-11 NOTE — Therapy (Signed)
[facility] [address], [address] [place], [place], [address] Phone: [phone]   Fax:  [phone]  Physical Therapy Evaluation  Patient Details  Name: Brianna Barr MRN: [id number] Date of Birth: 09-01-1996 Referring Provider (PT): Dr. Lanetta Inch   Encounter Date: 01/11/2021   PT End of Session - 01/11/21 1015    Visit Number 1    Date for PT Re-Evaluation 05/13/21    PT Start Time 0845    PT Stop Time 0930    PT Time Calculation (min) 45 min    Activity Tolerance Patient limited by pain;Patient tolerated treatment well    Behavior During Therapy [facility] for tasks assessed/performed           Past Medical History:  Diagnosis Date  . Anxiety   . Asthma   . Depression   . Overactive bladder   . Stress incontinence     Past Surgical History:  Procedure Laterality Date  . WISDOM TOOTH EXTRACTION      There were no vitals filed for this visit.    Subjective Assessment - 01/11/21 0849    Subjective Had PT 2 years ago with Eulis Foster, PT. Has issues with pelvic floor pain, vaginismus, incontinence.I have not started the dilator work. Patient has been having trigger point injections with Dr. Kaylyn Lim. It is sore down there.    Patient Stated Goals decreased pain; decreased incontinence    Currently in Pain? Yes    Pain Score 8    rest 1-2/10   Pain Location Vagina    Pain Orientation Mid    Pain Descriptors / Indicators Spasm;Tightness;Sharp;Aching;Sore    Pain Type Chronic pain    Pain Onset More than a month ago    Pain Frequency Constant    Aggravating Factors  vaginal exam    Pain Relieving Factors rest              [facility] PT Assessment - 01/11/21 0001      Assessment   Medical Diagnosis N32.81 overactive bladder; N94.10 Dyspareunia, female; K27.062 Levator spasm    Referring Provider (PT) Dr. Lanetta Inch    Onset Date/Surgical Date --   chronic   Prior Therapy yes but stopped  due covid; sees a chiroprator for the past 2 years      Precautions   Precautions None      Restrictions   Weight Bearing Restrictions No      Balance Screen   Has the patient fallen in the past 6 months No    Has the patient had a decrease in activity level because of a fear of falling?  No    Is the patient reluctant to leave their home because of a fear of falling?  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    Leisure walks      Cognition   Overall Cognitive Status Within Functional Limits for tasks assessed      Posture/Postural Control   Posture/Postural Control No significant limitations      ROM / Strength   AROM / PROM / Strength AROM;PROM;Strength      AROM   Overall AROM Comments lumbar ROM is full      PROM   Right Hip Flexion 85    Left Hip Flexion 95  Palpation   SI assessment  right ilium rotated anteriorly    Palpation comment abdominal tenderness throughout; tendernss located in bilateral hamstring, gluteals, quads, ITB, lateral hip, levator ani and Obtrator internist                      Objective measurements completed on examination: See above findings.     Pelvic Floor Special Questions - 01/11/21 0001    Prior Pregnancies No    Currently Sexually Active No   past very painful   Urinary Leakage Yes    Pad use 1-2 pads per day; 1 pad at night   gets up 1 time at night   Activities that cause leaking With strong urge;Coughing;Sneezing;Laughing;Lifting;Bending;Walking;Other    Other activities that cause leaking moving in the bed;    Urinary urgency Yes    Urinary frequency 10x during the day; 1 time at night    Fecal incontinence No   strain to have BM, IBS with diarrhea and constipation   Fluid intake drinks water, Dr. Reino KentPepper low cal 3 times per week    Falling out feeling (prolapse)  No   feels tense like she cannot relax   External Perineal Exam tenderness located on bil. ischiocavernosus, bulbocavernosus, perineal body, superficial transverse    Skin Integrity Intact;Other   dryness   External Palpation Q-tip with tenderness throughout the vulvar    Pelvic Floor Internal Exam Patient approves PT to assess pelvic floor and treatment    Exam Type Vaginal    Palpation able to place a Q-tip in vaginally with discomfort and not pain    Strength --   not able to assess due to pain   Tone increased tone                    PT Education - 01/11/21 1014    Education Details you tube videos for pelvic floor meditation and perineal massage; vaginal moisturizers    Person(s) Educated Patient    Methods Explanation;Handout;Demonstration    Comprehension Verbalized understanding;Returned demonstration            PT Short Term Goals - 01/11/21 1026      PT SHORT TERM GOAL #1   Title independent with initial HEP    Time 4    Period Weeks    Status New    Target Date 02/08/21      PT SHORT TERM GOAL #2   Title able to tolerate external palpation to the vulva area with pain level </= 3/10    Baseline --    Time 4    Period Weeks    Status New    Target Date 02/08/21      PT SHORT TERM GOAL #3   Title using pelvic floor meditation to relax the pelvic floor and decrease her anxiety    Time 4    Period Weeks    Status New    Target Date 02/08/21      PT SHORT TERM GOAL #4   Title understand vaginal health and how to restore ph balance of the vagina    Time 4    Period Weeks    Status New    Target Date 02/08/21             PT Long Term Goals - 01/11/21 1027      PT LONG TERM GOAL #1   Title independent with advanced HEP    Baseline --  Time 4    Period Months    Status New    Target Date 05/13/21      PT LONG TERM GOAL #2   Title able to tolerate a vaginal exam due to the ability to use the largest dilator and elongate the vaginal  canal with diaphragmatic breathing    Baseline --    Time 4    Period Months    Status New    Target Date 05/13/21      PT LONG TERM GOAL #3   Title pelvic pain decreased >/= 80% due to the ability to elongate the vaginal canal with largest dilator    Time 4    Period Months    Status New    Target Date 05/13/21      PT LONG TERM GOAL #4   Title urinary leakage decreased >/= 80% due to decreased over active bladder activity and understands ways to calm the bladder down    Baseline --    Time 4    Period Months    Status New    Target Date 05/13/21      PT LONG TERM GOAL #5   Title not having to wear a pad due to reduction of urinary leakage with activity and able to fully contract and relax her pelvic floor    Baseline --    Time 4    Period Months    Status New    Target Date 05/13/21                  Plan - 01/11/21 1016    Clinical Impression Statement Patient is a 25 year old female with overactive bladder, dyspareunia, and levator ani spasm. Patient has had pelvic floor physical therapy before but stopped due ot COVID. Patient is presently having injections into the levator muscles. Patient reports her daily pain is 1-2/10 and during vaginal penetration is 8/10. Patient was able to tolerate a Q-tip inserted into the vaginal canal and will be doing this at home. Patient was very anxious in therapy due to fear of the vaginal pain with exam. Patient has tenderness located in the abdomen, gluteals, hamstring, hip adductors, quadricep, ITB, lateral hip, ischiocavernosus, bulbocavernosus, obturator internist, perineal body, and superficial transverse. Pelvic floor strength was not assessed due to patient being anxious and Q-tip test was painful throughout the introitus. Patient leaks urine during the day and night with activity. She will urinate 10x per day and 1 time at night. Patient has urgency with toileting. She will strain to have a bowel movement. Left ilium is rotated  posteriorly. Bilateral hip flexion passively is limited due to pain and tightness. Patient has dilators at home but has not used them. Patient has vaginal valium so she was instructed to use prior to PT appointments but to assist with pain. Patient will benefit from skilled therapy to reduce pelvic pain, improve pelvic floor coordination, improve tissue mobility and quality of life.    Personal Factors and Comorbidities Fitness;Past/Current Experience;Time since onset of injury/illness/exacerbation    Examination-Activity Limitations Continence;Toileting;Bed Mobility;Squat;Lift;Bend    Examination-Participation Restrictions Interpersonal Relationship;Community Activity    Stability/Clinical Decision Making Stable/Uncomplicated    Clinical Decision Making Low    Rehab Potential Excellent    PT Frequency 1x / week    PT Duration Other (comment)   4 months   PT Treatment/Interventions ADLs/Self Care Home Management;Biofeedback;Cryotherapy;Electrical Stimulation;Moist Heat;Ultrasound;Therapeutic activities;Therapeutic exercise;Neuromuscular re-education;Patient/family education;Manual techniques;Passive range of motion;Dry needling;Taping;Spinal Manipulations;Joint Manipulations  PT Next Visit Plan mobilization of bilateral hips; hip stretches; abominal work; fascial work to the legs and gluteals    Consulted and Agree with Plan of Care Patient           Patient will benefit from skilled therapeutic intervention in order to improve the following deficits and impairments:  Decreased coordination,Decreased range of motion,Increased fascial restricitons,Decreased endurance,Increased muscle spasms,Decreased activity tolerance,Pain,Impaired flexibility,Decreased strength,Decreased mobility  Visit Diagnosis: Cramp and spasm - Plan: PT plan of care cert/re-cert  Unspecified lack of coordination - Plan: PT plan of care cert/re-cert  Urge incontinence of urine - Plan: PT plan of care  cert/re-cert  Vaginismus - Plan: PT plan of care cert/re-cert     Problem List Patient Active Problem List   Diagnosis Date Noted  . Anxiety and depression 07/07/2019  . Mild intermittent asthma without complication 07/07/2019  . Seasonal allergies 07/07/2019  . Plantar fasciitis 07/07/2019  . Hyperhidrosis 07/07/2019  . OAB (overactive bladder) 07/07/2019  . Allergy 08/06/2018    Eulis Foster, PT 01/11/21 10:32 AM   [facility] [address] [place], [place], [address] Phone: [phone]   Fax:  [phone]  Name: Brianna Barr MRN: [id number] Date of Birth: 29-Apr-1996

## 2021-01-11 NOTE — Patient Instructions (Addendum)
Self treatment for pelvic pain  [facility] PT   Guided Meditation for Pelvic Floor Relaxation  [employer]em[employer]  Moisturizers . They are used in the vagina to hydrate the mucous membrane that make up the vaginal canal.   Creams to use externally on the Vulva area  Mar[employer] (good for for cancer patients that had radiation to the area)- [place] or [employer].[email or web address]  V-magic cream - amazon  Julva-amazon  Vital "V Wild Yam salve ( help moisturize and help with thinning vulvar area, does have Beeswax  MoodMaid Botanical Pro-Meno Wild Yam Cream- Amazon  Desert Harvest Gele  Cleo by Zane Herald labial moisturizer (Amazon,   Coconut or olive oil  aloe   Things to avoid in the vaginal area . Do not use things to irritate the vulvar area . No lotions just specialized creams for the vulva area- Neogyn, V-magic, No soaps; can use Aveeno or Calendula cleanser if needed. Must be gentle . No deodorants . No douches . Good to sleep without underwear to let the vaginal area to air out . No scrubbing: spread the lips to let warm water rinse over labias and pat dry  Diaphragmatic breathing 4 times per day for 1-2 minutes  [facility] [place] [address], [address] [place], [place] [address] Phone # [phone] Fax [phone]

## 2021-01-11 NOTE — Addendum Note (Signed)
Addended by: Tish Frederickson A on: 01/11/2021 10:54 AM   Modules accepted: Orders

## 2021-01-17 ENCOUNTER — Ambulatory Visit: Payer: 59 | Admitting: Physical Therapy

## 2021-01-17 ENCOUNTER — Other Ambulatory Visit: Payer: Self-pay

## 2021-01-17 ENCOUNTER — Encounter: Payer: Self-pay | Admitting: Physical Therapy

## 2021-01-17 DIAGNOSIS — R252 Cramp and spasm: Secondary | ICD-10-CM | POA: Diagnosis not present

## 2021-01-17 DIAGNOSIS — R279 Unspecified lack of coordination: Secondary | ICD-10-CM

## 2021-01-17 DIAGNOSIS — N942 Vaginismus: Secondary | ICD-10-CM

## 2021-01-17 DIAGNOSIS — N3941 Urge incontinence: Secondary | ICD-10-CM

## 2021-01-17 NOTE — Patient Instructions (Signed)
Access Code: [email or web address] URL: [email or web address] Date: 01/17/2021 Prepared by: Eulis Foster  Program Notes Abdominal Massage for Constipation #[email or web address]    Exercises Supine Single Knee to Chest Stretch - 1 x daily - 7 x weekly - 1 sets - 2 reps - 30 sec hold Supine Pelvic Floor Stretch - 1 x daily - 7 x weekly - 1 sets - 2 reps - 30 sec hold Supine Piriformis Stretch Pulling Heel to Hip - 1 x daily - 7 x weekly - 1 sets - 2 reps - 30 sec hold Cat Cow - 1 x daily - 7 x weekly - 1 sets - 15 reps Hip Hinge Rock Back - 1 x daily - 7 x weekly - 1 sets - 15 reps Half Kneeling Hip Flexor Stretch with Sidebend - 1 x daily - 7 x weekly - 1 sets - 2 reps - 30 sec hold [facility] [address] [place], [place] [address] Phone # [phone] Fax [phone]

## 2021-01-17 NOTE — Therapy (Signed)
[facility] [address], [address] [place], [place], [address] Phone: [phone]   Fax:  [phone]  Physical Therapy Treatment  Patient Details  Name: Brianna Barr MRN: [id number] Date of Birth: 27-Nov-1995 Referring Provider (PT): Dr. Lanetta Inch   Encounter Date: 01/17/2021   PT End of Session - 01/17/21 1150    Visit Number 2    Date for PT Re-Evaluation 05/13/21    Authorization Type [employer]    Authorization - Visit Number 2    Authorization - Number of Visits 60    PT Start Time 1100    PT Stop Time 1142    PT Time Calculation (min) 42 min    Activity Tolerance Patient limited by pain;Patient tolerated treatment well    Behavior During Therapy [facility] for tasks assessed/performed           Past Medical History:  Diagnosis Date  . Anxiety   . Asthma   . Depression   . Overactive bladder   . Stress incontinence     Past Surgical History:  Procedure Laterality Date  . WISDOM TOOTH EXTRACTION      There were no vitals filed for this visit.   Subjective Assessment - 01/17/21 1103    Subjective NO changes. I am anxious using the Q-tips. Pain level is 2-3/10.    Patient Stated Goals decreased pain; decreased incontinence    Currently in Pain? Yes    Pain Score 8     Pain Location Vagina    Pain Orientation Mid    Pain Descriptors / Indicators Aching;Spasm;Sore;Sharp;Tightness    Pain Type Chronic pain    Pain Onset More than a month ago    Pain Frequency Intermittent    Aggravating Factors  vaginal exam, standing, moving    Pain Relieving Factors rest    Multiple Pain Sites No              [facility] PT Assessment - 01/17/21 0001      PROM   Right Hip Flexion 115   after manual mobilization   Left Hip Flexion 115   after manaul mobilization                        [facility] Adult PT Treatment/Exercise - 01/17/21 0001      Lumbar Exercises: Stretches   Single Knee to Chest  Stretch Right;Left;1 rep;30 seconds    Hip Flexor Stretch Right;Left    Hip Flexor Stretch Limitations half kneel with arm to side overhead    Piriformis Stretch Right;Left;1 rep    Piriformis Stretch Limitations supine    Other Lumbar Stretch Exercise happy baby with one leg at a time holding 30 sec      Lumbar Exercises: Quadruped   Madcat/Old Horse 15 reps    Other Quadruped Lumbar Exercises hip hinge in quadruped with tactile cues to keep spine flat 20x      Manual Therapy   Manual Therapy Joint mobilization;Soft tissue mobilization    Manual therapy comments manually stretched bilateral hips into flexion and external rotation    Joint Mobilization using mulligan belt to mobilize the hips grade 4 for distraction and lateral glide and inferior glide    Soft tissue mobilization abdominal massage with circular massage around the intestines, swooping massage around the intestines, vibration massage along the large intestines then bringing the tissu from the back to the front then showed patient the you tube video she can  follow to do the abdominal massage at home                  PT Education - 01/17/21 1145    Education Details Access Code: [email or web address]; you tube videos for abdominal massage    Person(s) Educated Patient    Methods Explanation;Demonstration;Verbal cues;Handout    Comprehension Returned demonstration;Verbalized understanding            PT Short Term Goals - 01/11/21 1026      PT SHORT TERM GOAL #1   Title independent with initial HEP    Time 4    Period Weeks    Status New    Target Date 02/08/21      PT SHORT TERM GOAL #2   Title able to tolerate external palpation to the vulva area with pain level </= 3/10    Baseline --    Time 4    Period Weeks    Status New    Target Date 02/08/21      PT SHORT TERM GOAL #3   Title using pelvic floor meditation to relax the pelvic floor and decrease her anxiety    Time 4    Period Weeks    Status New     Target Date 02/08/21      PT SHORT TERM GOAL #4   Title understand vaginal health and how to restore ph balance of the vagina    Time 4    Period Weeks    Status New    Target Date 02/08/21             PT Long Term Goals - 01/11/21 1027      PT LONG TERM GOAL #1   Title independent with advanced HEP    Baseline --    Time 4    Period Months    Status New    Target Date 05/13/21      PT LONG TERM GOAL #2   Title able to tolerate a vaginal exam due to the ability to use the largest dilator and elongate the vaginal canal with diaphragmatic breathing    Baseline --    Time 4    Period Months    Status New    Target Date 05/13/21      PT LONG TERM GOAL #3   Title pelvic pain decreased >/= 80% due to the ability to elongate the vaginal canal with largest dilator    Time 4    Period Months    Status New    Target Date 05/13/21      PT LONG TERM GOAL #4   Title urinary leakage decreased >/= 80% due to decreased over active bladder activity and understands ways to calm the bladder down    Baseline --    Time 4    Period Months    Status New    Target Date 05/13/21      PT LONG TERM GOAL #5   Title not having to wear a pad due to reduction of urinary leakage with activity and able to fully contract and relax her pelvic floor    Baseline --    Time 4    Period Months    Status New    Target Date 05/13/21                 Plan - 01/17/21 1151    Clinical Impression Statement Patient is using the Q-tip with pain level 2/10. Patient had  decreased back pain after the manual work on her abdomen and decreased firmness in the abdomen. Patient has difficulty with keeping spinal neutral with hip hinge and needs tactile cues Patient bilateral hip P/ROM for hip flexion increased to 115 degrees after manual mobilization. Patient has tightness in the hips. Patient will benefit from skilled therapy to reduce pelvic pain, improve pelvic floor coordination, improve tissue  mobility and quality of life.    Personal Factors and Comorbidities Fitness;Past/Current Experience;Time since onset of injury/illness/exacerbation    Examination-Activity Limitations Continence;Toileting;Bed Mobility;Squat;Lift;Bend    Examination-Participation Restrictions Interpersonal Relationship;Community Activity    Stability/Clinical Decision Making Stable/Uncomplicated    Rehab Potential Excellent    PT Frequency 1x / week    PT Duration Other (comment)   4 months   PT Treatment/Interventions ADLs/Self Care Home Management;Biofeedback;Cryotherapy;Electrical Stimulation;Moist Heat;Ultrasound;Therapeutic activities;Therapeutic exercise;Neuromuscular re-education;Patient/family education;Manual techniques;Passive range of motion;Dry needling;Taping;Spinal Manipulations;Joint Manipulations    PT Next Visit Plan diaphragmatic breathing, manual work to thighs and buttocks, and educate on perineal massage    PT Home Exercise Plan Access Code: [email or web address]    Recommended Other Services MD signed initial note    Consulted and Agree with Plan of Care Patient           Patient will benefit from skilled therapeutic intervention in order to improve the following deficits and impairments:  Decreased coordination,Decreased range of motion,Increased fascial restricitons,Decreased endurance,Increased muscle spasms,Decreased activity tolerance,Pain,Impaired flexibility,Decreased strength,Decreased mobility  Visit Diagnosis: Cramp and spasm  Unspecified lack of coordination  Urge incontinence of urine  Vaginismus     Problem List Patient Active Problem List   Diagnosis Date Noted  . Anxiety and depression 07/07/2019  . Mild intermittent asthma without complication 07/07/2019  . Seasonal allergies 07/07/2019  . Plantar fasciitis 07/07/2019  . Hyperhidrosis 07/07/2019  . OAB (overactive bladder) 07/07/2019  . Allergy 08/06/2018    Eulis Foster, PT 01/17/21 11:55 AM   [facility] [address], [address] [place], [place], [address] Phone: [phone]   Fax:  [phone]  Name: Brianna Barr MRN: [id number] Date of Birth: 1996/09/26

## 2021-01-18 ENCOUNTER — Encounter: Payer: Self-pay | Admitting: Obstetrics and Gynecology

## 2021-01-18 ENCOUNTER — Ambulatory Visit (INDEPENDENT_AMBULATORY_CARE_PROVIDER_SITE_OTHER): Payer: Commercial Managed Care - PPO | Admitting: Obstetrics and Gynecology

## 2021-01-18 VITALS — BP 114/78 | HR 103 | Wt 240.0 lb

## 2021-01-18 DIAGNOSIS — M62838 Other muscle spasm: Secondary | ICD-10-CM

## 2021-01-18 MED ORDER — TRIAMCINOLONE ACETONIDE 40 MG/ML IJ SUSP
40.0000 mg | Freq: Once | INTRAMUSCULAR | Status: AC
  Administered 2021-01-18: 40 mg

## 2021-01-18 MED ORDER — BUPIVACAINE HCL (PF) 0.25 % IJ SOLN
9.0000 mL | Freq: Once | INTRAMUSCULAR | Status: AC
  Administered 2021-01-18: 9 mL

## 2021-01-18 NOTE — Progress Notes (Signed)
Ms. Brianna Barr is a 25 y.o. female who presents for levator trigger point injection, series #4.   Vitals:   01/18/21 0906  BP: 114/78  Pulse: (!) 103    Indication(s): Levator spasm.   Informed Consent:  The alternatives, risks and benefits of the procedure were explained to the patient. Risks including, but not limited to discomfort, pain, bleeding, infection, injury to nearby structures, inability to perform the procedure, failure of the procedure were discussed.  All questions were answered and the patient elected to proceed.  Procedure:   The patient was positioned in dorsal lithotomy position.  The vaginal tissues were prepped with 2% lidocaine jelly and then Hibiclens solution.  An injection of 10cc of a mixture of 90% anesthetic (0.25% Bupivacaine) and 10% 40mg /ml Triamcinolone acetomide (Kenalog) was performed in multiple in the bilateral levator muscles, a total of 6 injection sites.  Pressure was held over bleeding areas until good hemostasis was achieved.   The patient tolerated the procedure well with no apparent complications.  , MD

## 2021-01-18 NOTE — Addendum Note (Signed)
Addended by: Tish Frederickson A on: 01/18/2021 09:47 AM   Modules accepted: Orders

## 2021-01-21 ENCOUNTER — Ambulatory Visit (INDEPENDENT_AMBULATORY_CARE_PROVIDER_SITE_OTHER): Payer: 59 | Admitting: Psychiatry

## 2021-01-21 ENCOUNTER — Other Ambulatory Visit: Payer: Self-pay

## 2021-01-21 DIAGNOSIS — F411 Generalized anxiety disorder: Secondary | ICD-10-CM

## 2021-01-21 NOTE — Progress Notes (Signed)
[facility] Counselor/Therapist Progress Note  Patient ID: Brianna Barr, MRN: [id number],    Date: 01/21/2021  Time Spent: 60 minutes   9:00am to 10:00am  Treatment Type: Individual Therapy  Reported Symptoms: anxiety, depression, with anxiety being stronger symptom  Mental Status Exam:  Appearance:   Casual     Behavior:  Appropriate, Sharing and Motivated  Motor:  Normal  Speech/Language:   Clear and Coherent  Affect:  anxious, depressed  Mood:  anxious and depressed  Thought process:  normal  Thought content:    WNL  Sensory/Perceptual disturbances:    WNL  Orientation:  oriented to person, place, time/date, situation, day of week, month of year and year  Attention:  Good  Concentration:  Good  Memory:  WNL  Fund of knowledge:   Good  Insight:    Good  Judgment:   Good  Impulse Control:  Good   Risk Assessment: Danger to Self:  No Self-injurious Behavior: No Danger to Others: No Duty to Warn:no Physical Aggression / Violence:No  Access to Firearms a concern: No  Gang Involvement:No   Subjective: Patient today reports anxiety and depression. Has had continued health anxiety re: newer treatments she is trying for a type of "pelvic floor dysfunction".   Interventions: Cognitive Behavioral Therapy, Solution-Oriented/Positive Psychology and Ego-Supportive  Diagnosis:   ICD-10-CM   1. Generalized anxiety disorder  F41.1      Plan of Care: Patient not signing tx plan on computer screen due to COVID.   Treatment Goals: Goals will remain on tx plan as patient works with strategies to meet her goals. Progress will be noted each session and documented in "Progress" section of Plan.  Long term goal: Reduce overalllevel, frequency, and intensity of the anxiety so that daily functioning is not impaired.  Short term goal: Increase understandingof beliefs and messages that produce anxiety, depression, or "worries".  Strategy: Identify,  challenge,and replace anxious/depressive/fearful self-talk with positive, realistic, and empowering self-talk.  Progress: Health anxiety with some current treatments she getting but difficult to manage the pain of injections, and also the embarrassment of symptoms. Discussed this today and responded well to support. Also began a medically supervised weight loss injection. Struggles with "messages in my head about wasting food, etc". Knows she's going to have to work hard to stop negative/judgemental self-talk. Personal, family, and health issues still fuel her anxiety and stress, which she processed more of her concerns in these areas today including fears, doubt, and anxieties. Has begun exercising some at home. Expresses she is still wanting to get a place of her own to live, feeling that will be good for her emotionally also, and hopes to maybe starting looking more seriously in October, with her student loans being a factor in her decision.  Worked further today with her strategy and treatment goal plan above to practice more of the anxious thought interruption and replacement with more empowering and realistic thoughts.  Following up from prior session, she states that she is trying to stay more in the present and focus on what she can control, and trying to decrease her negative/discouraging thoughts.  She does have a good network of friends and was encouraged to stay in contact with them, to work on improving the way she defines herself at times, to keep healthy boundaries with family and others as needed, practice social anxiety strategies per work done in recent sessions, and to recognize the strength she is showing and being more resilient as  she works on difficult issues that she feels has helped her back from moving forward in her emotional health.  Goal review and progress/challenges noted with patient.  Next appt within 2 to 3 weeks.   Mathis Fare,  LCSW

## 2021-01-24 NOTE — Progress Notes (Signed)
Brianna Barr is a 25 y.o. female who presents for levator trigger point injection, series #5. Has been using the dilators 4-5 times in the last week, and attended physical therapy. She feels that she has seen improvement in her symptoms.  Vitals:   01/25/21 0859  BP: 122/82  Pulse: (!) 112    Indication(s): Levator spasm.   Informed Consent:  The alternatives, risks and benefits of the procedure were explained to the patient. Risks including, but not limited to discomfort, pain, bleeding, infection, injury to nearby structures, inability to perform the procedure, failure of the procedure were discussed.  All questions were answered and the patient elected to proceed.  Procedure:   The patient was positioned in dorsal lithotomy position.  The vaginal tissues were prepped with 2% lidocaine jelly and then Hibiclens solution.  An injection of 10cc of a mixture of 90% anesthetic (0.25% Bupivacaine) and 10% 40mg /ml Triamcinolone acetomide (Kenalog) was performed in multiple in the bilateral levator muscles, a total of 6 injection sites.  Pressure was held over bleeding areas until good hemostasis was achieved.   The patient tolerated the procedure well with no apparent complications.  , MD

## 2021-01-25 ENCOUNTER — Ambulatory Visit (INDEPENDENT_AMBULATORY_CARE_PROVIDER_SITE_OTHER): Payer: Commercial Managed Care - PPO | Admitting: Obstetrics and Gynecology

## 2021-01-25 ENCOUNTER — Encounter: Payer: Self-pay | Admitting: Obstetrics and Gynecology

## 2021-01-25 VITALS — BP 122/82 | HR 112 | Wt 240.0 lb

## 2021-01-25 DIAGNOSIS — M62838 Other muscle spasm: Secondary | ICD-10-CM

## 2021-01-29 ENCOUNTER — Ambulatory Visit (INDEPENDENT_AMBULATORY_CARE_PROVIDER_SITE_OTHER): Payer: 59 | Admitting: Internal Medicine

## 2021-01-29 ENCOUNTER — Encounter: Payer: Self-pay | Admitting: Internal Medicine

## 2021-01-29 ENCOUNTER — Other Ambulatory Visit: Payer: Self-pay

## 2021-01-29 VITALS — BP 98/66 | HR 64 | Ht 62.0 in | Wt 234.6 lb

## 2021-01-29 DIAGNOSIS — R1030 Lower abdominal pain, unspecified: Secondary | ICD-10-CM

## 2021-01-29 DIAGNOSIS — K581 Irritable bowel syndrome with constipation: Secondary | ICD-10-CM | POA: Diagnosis not present

## 2021-01-29 NOTE — Patient Instructions (Addendum)
You have been given some samples of Linzess - take 30 minutes before your first meal of the day.  If this works well, call us and I will send in a prescription.  Please follow up on _____________________

## 2021-01-29 NOTE — Progress Notes (Signed)
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  She reports to me that 5 or 6 years ago after taking a prolonged course of antibiotics she developed problems with loose stools.  Subsequently constipation.  Since that time she has had constipation predominance alternating with occasional diarrhea.  She may go up to 1 week without a bowel movement.  She becomes uncomfortable.  Defecation relieves the discomfort.  She will also experience intermittent cramping with loose stools.  She has tried different agents including Benefiber with MiraLAX, Senokot, and occasional enemas.  She does not require medication for diarrhea.  She does not notice that meals exacerbate symptoms.  She does notice that she may have more loose stools with anxiety.  No significant nocturnal component.  No bleeding.  No family history of gastrointestinal malignancy.  She does have a history of pelvic floor dysfunction and vaginismus.  For these issues she is currently enrolled in physical therapy.  Review of blood work from December 2021 shows a hemoglobin of 11.8..  TSH from February 2022 was normal.  REVIEW OF SYSTEMS:  All non-GI ROS negative unless otherwise stated in the HPI except for sinus and allergy, anxiety, depression, back pain, fatigue, excessive urination, urinary frequency, urinary leakage  Past Medical History:  Diagnosis Date  . Anxiety   . Asthma   . Depression   . Overactive bladder   . Stress incontinence     Past Surgical History:  Procedure Laterality Date  . WISDOM TOOTH EXTRACTION      Social History Brianna Barr  reports that she has never smoked. She has never used smokeless tobacco. She reports that she does not drink alcohol and does not use drugs.  family history includes Cervical cancer in her maternal grandmother; Diabetes in her  maternal grandmother and paternal grandmother; Endometrial cancer in her paternal grandmother; Heart disease in her maternal grandmother and paternal grandmother; Hypertension in her father, maternal grandmother, and paternal grandmother; Multiple sclerosis in her mother; Stroke in her paternal grandfather and paternal grandmother.  No Known Allergies     PHYSICAL EXAMINATION: Vital signs: BP 98/66   Pulse 64   Ht 5\' 2"  (1.575 m)   Wt 234 lb 9.6 oz (106.4 kg)   BMI 42.91 kg/m   Constitutional: generally well-appearing, no acute distress Psychiatric: alert and oriented x3, cooperative Eyes: extraocular movements intact, anicteric, conjunctiva pink Mouth: oral pharynx moist, no lesions Neck: supple no lymphadenopathy Cardiovascular: heart regular rate and rhythm, no murmur Lungs: clear to auscultation bilaterally Abdomen: soft, obese, nontender, nondistended, no obvious ascites, no peritoneal signs, normal bowel sounds, no organomegaly Rectal: Omitted Extremities: no clubbing, cyanosis, or lower extremity edema bilaterally Skin: no lesions on visible extremities Neuro: No focal deficits.  Cranial nerves intact  ASSESSMENT:  1.  Constipation predominant irritable syndrome 2.  Pelvic floor dysfunction 3.  Obesity   PLAN:  1.  Trial of Linzess 145 mcg daily.  Samples given.  If tolerated and effective, continue and we will prescribe.  If tolerated but ineffective, I have instructed her to increase the dose to 290 mcg daily.  She will give feedback. 2.  Continues with pelvic floor physical therapy 3.  Routine GI office follow-up 6 weeks A total time of 45 minutes was spent preparing to see the patient, reviewing tests and outside office evaluations, obtaining comprehensive history, performing comprehensive physical  examination, counseling the patient regarding her above listed issues, ordering medications and directed the usage, and documenting clinical information in the health  record

## 2021-01-30 ENCOUNTER — Ambulatory Visit: Payer: 59 | Attending: Obstetrics and Gynecology | Admitting: Physical Therapy

## 2021-01-30 ENCOUNTER — Encounter: Payer: Self-pay | Admitting: Physical Therapy

## 2021-01-30 ENCOUNTER — Other Ambulatory Visit: Payer: Self-pay

## 2021-01-30 DIAGNOSIS — N942 Vaginismus: Secondary | ICD-10-CM

## 2021-01-30 DIAGNOSIS — R252 Cramp and spasm: Secondary | ICD-10-CM | POA: Diagnosis not present

## 2021-01-30 DIAGNOSIS — N3941 Urge incontinence: Secondary | ICD-10-CM | POA: Diagnosis present

## 2021-01-30 DIAGNOSIS — R279 Unspecified lack of coordination: Secondary | ICD-10-CM | POA: Diagnosis present

## 2021-01-30 NOTE — Therapy (Signed)
[facility] [address] [place], [place], [address] Phone: [phone]   Fax:  [phone]  Physical Therapy Treatment  Patient Details  Name: Brianna Barr MRN: [id number] Date of Birth: 1996-09-27 Referring Provider (PT): Dr. Lanetta Inch   Encounter Date: 01/30/2021   PT End of Session - 01/30/21 1236    Visit Number 3    Date for PT Re-Evaluation 05/13/21    Authorization Type UHC    Authorization - Visit Number 3    PT Start Time 1145    PT Stop Time 1230    PT Time Calculation (min) 45 min    Activity Tolerance Patient limited by pain;Patient tolerated treatment well    Behavior During Therapy [facility] for tasks assessed/performed           Past Medical History:  Diagnosis Date  . Anxiety   . Asthma   . Depression   . Overactive bladder   . Stress incontinence     Past Surgical History:  Procedure Laterality Date  . WISDOM TOOTH EXTRACTION      There were no vitals filed for this visit.   Subjective Assessment - 01/30/21 1151    Subjective Patient is on the second or third dilator. Patient has had 2 injections with Dr. Florian Buff. I am not using the Q-tip now. No change in incontinence yet.    Patient Stated Goals decreased pain; decreased incontinence    Currently in Pain? Yes    Pain Score 8     Pain Location Vagina    Pain Orientation Mid    Pain Descriptors / Indicators Aching;Sharp;Sore;Spasm;Tightness    Pain Type Chronic pain    Pain Onset More than a month ago    Pain Frequency Intermittent    Aggravating Factors  vaginal exam, standing, moving, with speculum    Pain Relieving Factors rest    Multiple Pain Sites No                          Pelvic Floor Special Questions - 01/30/21 0001    Pelvic Floor Internal Exam Patient approves PT to assess pelvic floor and treatment    Exam Type Vaginal             [facility] Adult PT Treatment/Exercise - 01/30/21  0001      Self-Care   Self-Care Other Self-Care Comments    Other Self-Care Comments  educated patient on how to use the dilators to massage the pelvic floor muslces and reduce the trigger points.      Manual Therapy   Manual Therapy Soft tissue mobilization;Internal Pelvic Floor    Soft tissue mobilization manual mobilization to the hp adductors, quads, along the pubic rami and levator ani while monitoring for pain    Internal Pelvic Floor gentle manual work to the perineal body, puborectalis, iliococcygeus, monitoring for pain no higher than 3/10                  PT Education - 01/30/21 1233    Education Details educated patient on how to use the dilator to massage the pelvic floor muscles internally; education on how to massage the pelvic floor muscles externally    Person(s) Educated Patient    Methods Explanation;Demonstration    Comprehension Verbalized understanding            PT Short Term Goals - 01/30/21 1239      PT SHORT TERM  GOAL #1   Title independent with initial HEP    Time 4    Period Weeks    Status Achieved      PT SHORT TERM GOAL #2   Title able to tolerate external palpation to the vulva area with pain level </= 3/10    Time 4    Period Weeks    Status Achieved      PT SHORT TERM GOAL #3   Title using pelvic floor meditation to relax the pelvic floor and decrease her anxiety    Time 4    Period Weeks    Status Achieved             PT Long Term Goals - 01/11/21 1027      PT LONG TERM GOAL #1   Title independent with advanced HEP    Baseline --    Time 4    Period Months    Status New    Target Date 05/13/21      PT LONG TERM GOAL #2   Title able to tolerate a vaginal exam due to the ability to use the largest dilator and elongate the vaginal canal with diaphragmatic breathing    Baseline --    Time 4    Period Months    Status New    Target Date 05/13/21      PT LONG TERM GOAL #3   Title pelvic pain decreased >/= 80% due  to the ability to elongate the vaginal canal with largest dilator    Time 4    Period Months    Status New    Target Date 05/13/21      PT LONG TERM GOAL #4   Title urinary leakage decreased >/= 80% due to decreased over active bladder activity and understands ways to calm the bladder down    Baseline --    Time 4    Period Months    Status New    Target Date 05/13/21      PT LONG TERM GOAL #5   Title not having to wear a pad due to reduction of urinary leakage with activity and able to fully contract and relax her pelvic floor    Baseline --    Time 4    Period Months    Status New    Target Date 05/13/21                 Plan - 01/30/21 1236    Clinical Impression Statement Patient is using the second dilator now due to reduction of muscle spasms from getting the trigger point injections. Patient was educated on how to massage the pelvic floor muscles externally and internally. Patient allowed the therapist to work on her pelvic floor muscles for the first time but had to monitor her pain to not be higher than 3/10. Patient will benefit from skilled therapy to reduce pelvic pain, improve pelvic floor coordination, improve tissue mobility and quality of lift.    Personal Factors and Comorbidities Fitness;Past/Current Experience;Time since onset of injury/illness/exacerbation    Examination-Activity Limitations Continence;Toileting;Bed Mobility;Squat;Lift;Bend    Examination-Participation Restrictions Interpersonal Relationship;Community Activity    Stability/Clinical Decision Making Stable/Uncomplicated    Rehab Potential Excellent    PT Frequency 1x / week    PT Duration Other (comment)   4 months   PT Treatment/Interventions ADLs/Self Care Home Management;Biofeedback;Cryotherapy;Electrical Stimulation;Moist Heat;Ultrasound;Therapeutic activities;Therapeutic exercise;Neuromuscular re-education;Patient/family education;Manual techniques;Passive range of motion;Dry  needling;Taping;Spinal Manipulations;Joint Manipulations    PT Next Visit  Plan diaphragmatic breathing, manual work to thighs and buttocks, manual work internally; discuss vaginal health and ph level    PT Home Exercise Plan Access Code: [email or web address]    Consulted and Agree with Plan of Care Patient           Patient will benefit from skilled therapeutic intervention in order to improve the following deficits and impairments:  Decreased coordination,Decreased range of motion,Increased fascial restricitons,Decreased endurance,Increased muscle spasms,Decreased activity tolerance,Pain,Impaired flexibility,Decreased strength,Decreased mobility  Visit Diagnosis: Cramp and spasm  Unspecified lack of coordination  Urge incontinence of urine  Vaginismus     Problem List Patient Active Problem List   Diagnosis Date Noted  . Anxiety and depression 07/07/2019  . Mild intermittent asthma without complication 07/07/2019  . Seasonal allergies 07/07/2019  . Plantar fasciitis 07/07/2019  . Hyperhidrosis 07/07/2019  . OAB (overactive bladder) 07/07/2019  . Allergy 08/06/2018    Eulis Foster, PT 01/30/21 12:41 PM   [facility] [address] [place], [place], [address] Phone: [phone]   Fax:  [phone]  Name: Brianna Barr MRN: [id number] Date of Birth: Nov 01, 1995

## 2021-01-30 NOTE — Progress Notes (Addendum)
Brianna Barr is a 25 y.o. female who presents for levator trigger point injection, series #6 for levator spasm and high tone pelvic floor dysfunction. Has been using the dilators 3-4 times in the last week, and attended physical therapy. Has had some improvement but still has increased pain at times.   Vitals:   02/01/21 0901  BP: 119/81  Pulse: (!) 101    Indication(s): Levator spasm.   Informed Consent:  The alternatives, risks and benefits of the procedure were explained to the patient. Risks including, but not limited to discomfort, pain, bleeding, infection, injury to nearby structures, inability to perform the procedure, failure of the procedure were discussed.  All questions were answered and the patient elected to proceed.  Procedure:   The patient was positioned in dorsal lithotomy position.  The vaginal tissues were prepped with 2% lidocaine jelly and then Hibiclens solution.  An injection of 10cc of a mixture of 90% anesthetic (0.25% Bupivacaine) and 10% 40mg /ml Triamcinolone acetomide (Kenalog) was performed in multiple in the bilateral levator muscles, a total of 6 injection sites.  Pressure was held over bleeding areas until good hemostasis was achieved.   The patient tolerated the procedure well with no apparent complications.  Plan:   - Series of injections has been completed and patient has made progress with PT and home dilator therapy.  - She is potentially interested in botox injections into the pelvic floor as she has made some progress with the anesthetic but pain/ spasm is not completely resolved. Will look into costs and inform her.   , MD

## 2021-02-01 ENCOUNTER — Encounter: Payer: Self-pay | Admitting: Obstetrics and Gynecology

## 2021-02-01 ENCOUNTER — Other Ambulatory Visit: Payer: Self-pay

## 2021-02-01 ENCOUNTER — Ambulatory Visit (INDEPENDENT_AMBULATORY_CARE_PROVIDER_SITE_OTHER): Payer: Commercial Managed Care - PPO | Admitting: Obstetrics and Gynecology

## 2021-02-01 VITALS — BP 119/81 | HR 101 | Wt 234.0 lb

## 2021-02-01 DIAGNOSIS — M62838 Other muscle spasm: Secondary | ICD-10-CM

## 2021-02-01 DIAGNOSIS — M6289 Other specified disorders of muscle: Secondary | ICD-10-CM

## 2021-02-01 MED ORDER — TRIAMCINOLONE ACETONIDE 40 MG/ML IJ SUSP: 40.0000 mg | Freq: Once | INTRAMUSCULAR | Status: DC

## 2021-02-01 MED ORDER — BUPIVACAINE HCL (PF) 0.25 % IJ SOLN
9.0000 mL | Freq: Once | INTRAMUSCULAR | Status: AC
  Administered 2021-02-01: 9 mL

## 2021-02-01 MED ORDER — TRIAMCINOLONE ACETONIDE 40 MG/ML IJ SUSP
40.0000 mg | Freq: Once | INTRAMUSCULAR | Status: AC
  Administered 2021-02-01: 40 mg

## 2021-02-01 NOTE — Addendum Note (Signed)
Addended by: Marguerita Beards on: 02/01/2021 09:42 AM   Modules accepted: Orders

## 2021-02-04 ENCOUNTER — Ambulatory Visit (INDEPENDENT_AMBULATORY_CARE_PROVIDER_SITE_OTHER): Payer: 59 | Admitting: Psychiatry

## 2021-02-04 ENCOUNTER — Other Ambulatory Visit: Payer: Self-pay

## 2021-02-04 DIAGNOSIS — F411 Generalized anxiety disorder: Secondary | ICD-10-CM | POA: Diagnosis not present

## 2021-02-04 NOTE — Progress Notes (Signed)
[facility] Counselor/Therapist Progress Note  Patient ID: Brianna Barr, MRN: [id number],    Date: 02/04/2021  Time Spent: 60 minutes    10:00am to 11:00am  Treatment Type: Individual Therapy  Reported Symptoms: Anxiety, depression, self-negating  Mental Status Exam:  Appearance:   Neat     Behavior:  Appropriate, Sharing and Motivated  Motor:  Normal  Speech/Language:   Clear and Coherent  Affect:  anxiety  Mood:  anxious and depressed  Thought process:  goal directed  Thought content:    WNL  Sensory/Perceptual disturbances:    WNL  Orientation:  oriented to person, place, time/date, situation, day of week, month of year and year  Attention:  Good  Concentration:  Good and Fair  Memory:  WNL  Fund of knowledge:   Good  Insight:    Good  Judgment:   Good  Impulse Control:  Good   Risk Assessment: Danger to Self:  No Self-injurious Behavior: No Danger to Others: No Duty to Warn:no Physical Aggression / Violence:No  Access to Firearms a concern: No  Gang Involvement:No   Subjective: Patient today reporting depression and anxiety, with anxiety being her stronger symptom. States the past couple weeks have been a little better, adding I have slightly more energy than I've had physically. Attended a [employer] which was helpful to her.  (See Progress Note below.)  Interventions: Cognitive Behavioral Therapy and Solution-Oriented/Positive Psychology  Diagnosis:   ICD-10-CM   1. Generalized anxiety disorder  F41.1      Plan of Care: Patient not signing tx plan on computer screen due to COVID.   Treatment Goals: Goals will remain on tx plan as patient works with strategies to meet her goals. Progress will be noted each session and documented in "Progress" section of Plan.  Long term goal: Reduce overalllevel, frequency, and intensity of the anxiety so that daily functioning is not impaired.  Short term goal: Increase  understandingof beliefs and messages that produce anxiety, depression, or "worries".  Strategy: Identify, challenge,and replace anxious/depressive/fearful self-talk with positive, realistic, and empowering self-talk.  Progress: Patient in today reporting daily anxiety and depression mostly related to personal, family issues, and health concerns. Some tearfulness as she processes her personal and health issues that create anxiety, depression, and discomfort for her. Tends to be more self-critical (weight issues and some interpersonal struggles) and we discussed this in more detail today, including what she feels she can change, decreasing the self-negating, being more self-forgiving and having grace with herself. Healthcare with her injections is "in a waiting period and to decide whether to have more injections or not."  Still self-administeres weight loss injection but wants to be more committed . "I know I can be the queen of self-sabotage" .  Processed more emotions and looked at what this has meant for her "since elementary school" and how to work towards changing this, per her short-term goal and strategy in treatment plan above.  Has made some small progress about "the messages in my head about wasting food" and seems to have understanding of how her thoughts have controlled a lot of her eating habits have been counterproductive for her.  Commits to working harder to stop her self judgment and negative self talk, and we worked on this some in session today with specific examples. Encouraged patient to continue working more with anxious thought interruption and replacement with more reality based and empowering thoughts, stay more in the present and focus on what  she can control, interrupt negative/discouraging thoughts, stay in contact with friends and others who are supportive of her, continue working on improving the way she defines herself, set and keep healthy boundaries with others as needed,  practice positive self-care and self talk, and recognize and feel good about the strength and resilience she is showing as she works hard on difficult issues in the midst of challenging circumstances and trying to improve her overall emotional health and move forward in a more positive direction.   We will review and progress/challenges noted with patient.  Next appointment within 2 to 3 weeks.   Mathis Fare, LCSW

## 2021-02-06 ENCOUNTER — Ambulatory Visit: Payer: 59 | Admitting: Physical Therapy

## 2021-02-06 ENCOUNTER — Encounter: Payer: Self-pay | Admitting: Physical Therapy

## 2021-02-06 ENCOUNTER — Other Ambulatory Visit: Payer: Self-pay

## 2021-02-06 DIAGNOSIS — R279 Unspecified lack of coordination: Secondary | ICD-10-CM

## 2021-02-06 DIAGNOSIS — R252 Cramp and spasm: Secondary | ICD-10-CM | POA: Diagnosis not present

## 2021-02-06 DIAGNOSIS — N942 Vaginismus: Secondary | ICD-10-CM

## 2021-02-06 DIAGNOSIS — N3941 Urge incontinence: Secondary | ICD-10-CM

## 2021-02-06 NOTE — Patient Instructions (Addendum)
   Foam Rolling Exercises to Reduce Pelvic Pain you tube  Trigger Point Dry Needling  . What is Trigger Point Dry Needling (DN)? o DN is a physical therapy technique used to treat muscle pain and dysfunction. Specifically, DN helps deactivate muscle trigger points (muscle knots).  o A thin filiform needle is used to penetrate the skin and stimulate the underlying trigger point. The goal is for a local twitch response (LTR) to occur and for the trigger point to relax. No medication of any kind is injected during the procedure.   . What Does Trigger Point Dry Needling Feel Like?  o The procedure feels different for each individual patient. Some patients report that they do not actually feel the needle enter the skin and overall the process is not painful. Very mild bleeding may occur. However, many patients feel a deep cramping in the muscle in which the needle was inserted. This is the local twitch response.   Marland Kitchen How Will I feel after the treatment? o Soreness is normal, and the onset of soreness may not occur for a few hours. Typically this soreness does not last longer than two days.  o Bruising is uncommon, however; ice can be used to decrease any possible bruising.  o In rare cases feeling tired or nauseous after the treatment is normal. In addition, your symptoms may get worse before they get better, this period will typically not last longer than 24 hours.   . What Can I do After My Treatment? o Increase your hydration by drinking more water for the next 24 hours. o You may place ice or heat on the areas treated that have become sore, however, do not use heat on inflamed or bruised areas. Heat often brings more relief post needling. o You can continue your regular activities, but vigorous activity is not recommended initially after the treatment for 24 hours. o DN is best combined with other physical therapy such as strengthening, stretching, and other therapies.    [facility] [address] [place], [place] [address] Phone # [phone] Fax [phone]

## 2021-02-06 NOTE — Therapy (Signed)
[facility] [address] [place], [place], [address] Phone: [phone]   Fax:  [phone]  Physical Therapy Treatment  Patient Details  Name: Brianna Barr MRN: [id number] Date of Birth: 1996/05/12 Referring Provider (PT): Dr. Lanetta Inch   Encounter Date: 02/06/2021   PT End of Session - 02/06/21 0827    Visit Number 4    Date for PT Re-Evaluation 05/13/21    Authorization Type UHC    Authorization - Visit Number 4    Authorization - Number of Visits 60    PT Start Time 0800    PT Stop Time 0842    PT Time Calculation (min) 42 min    Activity Tolerance Patient tolerated treatment well;No increased pain    Behavior During Therapy WFL for tasks assessed/performed           Past Medical History:  Diagnosis Date  . Anxiety   . Asthma   . Depression   . Overactive bladder   . Stress incontinence     Past Surgical History:  Procedure Laterality Date  . WISDOM TOOTH EXTRACTION      There were no vitals filed for this visit.   Subjective Assessment - 02/06/21 0804    Subjective Patient is on the second dilator now. Patient reports once the trigger point shot wears off  pain goes to a 5-6/10. MD is looking into the Botox injections.    Patient Stated Goals decreased pain; decreased incontinence    Currently in Pain? Yes    Pain Score 6     Pain Location Vagina    Pain Orientation Mid    Pain Descriptors / Indicators Aching;Sharp;Sore;Spasm;Tightness    Pain Type Chronic pain    Pain Onset More than a month ago    Pain Frequency Intermittent    Aggravating Factors  vaginal exam, standing, moving with speculum    Pain Relieving Factors rest    Multiple Pain Sites No                          Pelvic Floor Special Questions - 02/06/21 0001    Pelvic Floor Internal Exam Patient consents and approves PT to assess pelvic floor and treatment    Exam Type Vaginal              [facility] Adult PT Treatment/Exercise - 02/06/21 0001      Self-Care   Self-Care Other Self-Care Comments    Other Self-Care Comments  discuassed with patient on the progression of using the dilators to avoid the 5-6/10 pain      Neuro Re-ed    Neuro Re-ed Details  diaphragmatic breathing with blue band around the rib cage to open up the lower rib cage down into the abdomen and then pelvic floor;      Manual Therapy   Manual Therapy Soft tissue mobilization    Manual therapy comments to assess for dry needling    Soft tissue mobilization used the addaday to bilateral hamstring, gluteals and lumbar in sidely; manual work to bilateral hip adductors to lengthen after dry needling            Trigger Point Dry Needling - 02/06/21 0001    Consent Given? Yes    Education Handout Provided Yes    Muscles Treated Lower Quadrant Adductor longus/brevis/magnus    Adductor Response Twitch response elicited;Palpable increased muscle length  PT Education - 02/06/21 0845    Education Details educated patient on progression of dilators; you tube video for foam rolling; information on dry needling    Person(s) Educated Patient    Methods Explanation;Demonstration;Handout    Comprehension Verbalized understanding;Returned demonstration            PT Short Term Goals - 01/30/21 1239      PT SHORT TERM GOAL #1   Title independent with initial HEP    Time 4    Period Weeks    Status Achieved      PT SHORT TERM GOAL #2   Title able to tolerate external palpation to the vulva area with pain level </= 3/10    Time 4    Period Weeks    Status Achieved      PT SHORT TERM GOAL #3   Title using pelvic floor meditation to relax the pelvic floor and decrease her anxiety    Time 4    Period Weeks    Status Achieved             PT Long Term Goals - 02/06/21 2426      PT LONG TERM GOAL #1   Title independent with advanced HEP    Time 4    Period Months     Status On-going      PT LONG TERM GOAL #2   Title able to tolerate a vaginal exam due to the ability to use the largest dilator and elongate the vaginal canal with diaphragmatic breathing    Time 4    Period Months    Status On-going      PT LONG TERM GOAL #3   Title pelvic pain decreased >/= 80% due to the ability to elongate the vaginal canal with largest dilator    Time 4    Period Months    Status On-going      PT LONG TERM GOAL #4   Title urinary leakage decreased >/= 80% due to decreased over active bladder activity and understands ways to calm the bladder down    Time 4    Period Months    Status On-going                 Plan - 02/06/21 0848    Clinical Impression Statement Patien is on the second dilator and will have 5/10 pain when she is moving the dilator. Patient was educated on how to progress the dilator appropriately and not to have more than 3/10. Patient responded well to the dry needling and had lengthen her hip adductors. Patient was able to do the 360 breathing of the lower rib cage when she used the theraband around the lower rib cage and felt the pelvic floor relax. Patient will benefit from skilled to reduce pelvic pain, improve pelvic floor coordination, improve tissue mobility and quality of lift.    Personal Factors and Comorbidities Fitness;Past/Current Experience;Time since onset of injury/illness/exacerbation    Examination-Activity Limitations Continence;Toileting;Bed Mobility;Squat;Lift;Bend    Examination-Participation Restrictions Interpersonal Relationship;Community Activity    Stability/Clinical Decision Making Stable/Uncomplicated    Rehab Potential Excellent    PT Frequency 1x / week    PT Duration Other (comment)   4 months   PT Treatment/Interventions ADLs/Self Care Home Management;Biofeedback;Cryotherapy;Electrical Stimulation;Moist Heat;Ultrasound;Therapeutic activities;Therapeutic exercise;Neuromuscular re-education;Patient/family  education;Manual techniques;Passive range of motion;Dry needling;Taping;Spinal Manipulations;Joint Manipulations    PT Next Visit Plan dry needling to the perineal body and levator ani, discuss vaginal health, see if she is  ready for internal work    PT Home Exercise Plan Access Code: [email or web address]    Consulted and Agree with Plan of Care Patient           Patient will benefit from skilled therapeutic intervention in order to improve the following deficits and impairments:  Decreased coordination,Decreased range of motion,Increased fascial restricitons,Decreased endurance,Increased muscle spasms,Decreased activity tolerance,Pain,Impaired flexibility,Decreased strength,Decreased mobility  Visit Diagnosis: Cramp and spasm  Unspecified lack of coordination  Urge incontinence of urine  Vaginismus     Problem List Patient Active Problem List   Diagnosis Date Noted  . Anxiety and depression 07/07/2019  . Mild intermittent asthma without complication 07/07/2019  . Seasonal allergies 07/07/2019  . Plantar fasciitis 07/07/2019  . Hyperhidrosis 07/07/2019  . OAB (overactive bladder) 07/07/2019  . Allergy 08/06/2018    Eulis Foster, PT 02/06/21 8:53 AM   [facility] [address] [place], [place], [address] Phone: [phone]   Fax:  [phone]  Name: Brianna Barr MRN: [id number] Date of Birth: 1996/02/22

## 2021-02-13 ENCOUNTER — Encounter: Payer: Self-pay | Admitting: Physical Therapy

## 2021-02-13 ENCOUNTER — Telehealth: Payer: Self-pay | Admitting: *Deleted

## 2021-02-13 ENCOUNTER — Ambulatory Visit: Payer: 59 | Admitting: Physical Therapy

## 2021-02-13 ENCOUNTER — Other Ambulatory Visit: Payer: Self-pay

## 2021-02-13 DIAGNOSIS — R252 Cramp and spasm: Secondary | ICD-10-CM | POA: Diagnosis not present

## 2021-02-13 DIAGNOSIS — N942 Vaginismus: Secondary | ICD-10-CM

## 2021-02-13 DIAGNOSIS — R279 Unspecified lack of coordination: Secondary | ICD-10-CM

## 2021-02-13 DIAGNOSIS — N3941 Urge incontinence: Secondary | ICD-10-CM

## 2021-02-13 NOTE — Patient Instructions (Addendum)
Moisturizers . They are used in the vagina to hydrate the mucous membrane that make up the vaginal canal. . Designed to keep a more normal acid balance (ph) . Once placed in the vagina, it will last between two to three days.  . Use 2-3 times per week at bedtime  . Ingredients to avoid is glycerin and fragrance, can increase chance of infection . Should not be used just before sex due to causing irritation . Most are gels administered either in a tampon-shaped applicator or as a vaginal suppository. They are non-hormonal.   Types of Moisturizers(internal use)  . Vitamin E vaginal suppositories- Whole foods, Amazon . Moist Again . Coconut oil- can break down condoms . Julva- (Do no use if on Tamoxifen) amazon . Yes moisturizer- amazon . NeuEve Silk , NeuEve Silver for menopausal or over 65 (if have severe vaginal atrophy or cancer treatments use NeuEve Silk for  1 month than move to [employer])- [employer], ShapeConsultant.com.cy . Olive and Bee intimate cream- www.oliveandbee.com.au . Mae vaginal moisturizer- Amazon . Aloe .    Creams to use externally on the Vulva area  Marathon Oil (good for for cancer patients that had radiation to the area)- [place] or [employer].https://garcia-valdez.org/  V-magic cream - amazon  Julva-amazon  Vital "V Wild Yam salve ( help moisturize and help with thinning vulvar area, does have Beeswax  [employer] Pro-Meno Wild Yam Cream- Amazon  Desert Harvest Gele  Cleo by Zane Herald labial moisturizer (Amazon,     aloe   Things to avoid in the vaginal area . Do not use things to irritate the vulvar area . No lotions just specialized creams for the vulva area- Neogyn, V-magic, No soaps; can use Aveeno or Calendula cleanser if needed. Must be gentle . No deodorants . No douches . Good to sleep without underwear to let the vaginal area to air out . No scrubbing: spread the lips to let warm water rinse over labias and pat dry   Irritants/Allergens ( on  exposure, can cause immediate stinging or burning)  Body fluids: urine, feces, vaginal discharge, sweat, semen  Feminine Hygiene Products: douches, yogurt douche, feminine wipes, baby wipes, sanitary pads, panty liners, tampons, deodorants (including deodorants in tampons/pads), lotions, powders (including talcum powder), perfumes, shampoos,soaps  Sexual support: lubricants, condoms, diaphragms, spermicides, arousal stimulants  Laundry detergent, bleach, fabric softener  Cleansing products: soap, bubble baths and salts, shampoo, conditioner, perfumed toielt paper  Physical irritants: tight fitting clothes, Nylon underwear, synthetic undergarments/pantyhose, chemically treated clothing, latex, wash cloths, sponges, hot water, excessive washing, vigorous drying with towel/hair dryer  HPV medication, tea tree oil, [place]  Alcohol and astringents  Topical medicaments: Benzocaine, Neomycin, Chlorhexidine (in K-Y Jelly), Imidazole antifungal, Propylene glycol ( Preservative used in many products:, tea tree oil, preservatives and bases medications are placed in.   Adapted from the V Book by Gardiner Ramus. Roseanne Reno, MD., and Gwynneth Albright [facility] Books, 2002), and Proceedings in Obstetrics and Gynecology, 2014; 4(2):1    Gentle Vulvar care- Adapted from the National Vulvodynia Association and the V Book  Wear loose clothing.  Wear all-cotton underwear, and go without when at home. Avoid wearing pantyhose, wear thigh high or knee high pantyhose. If you do not use underwear with yoga pants, try using an all-cotton against the vulva. Tight clothing can be irritating to the vulva by increasing friction and exacerbate irritation and redness.  Wear loose fitting clothes when possible. When swimming, remove wet bathing suits and shower to remove cholerine  from the area.Marland Kitchen  Avoid hot tubs and heavily chlorinated pools. Shower after exercise to eliminate excess consider avoiding exercise that causes  friction or pressure to the area such as horseback riding, bicycle riding, and spinning classes.   To cleanse the area, use your fingers instead of a washcloth and plain water.  If you feel you must use a cleanser, unscented, non-alkaline cleanser such as Cetaphil, or mild soaps made for sensitive skin such as Dove or Neurtrogena. Be sure any cleaners are unscented. Avoid rubbing the vulva. Soak for five minutes in lukewarm water to remove any residue of sweat or lotions or other products.  Pat dry, and apply any prescribed medication. Avoid products with multiple ingredients.  Even those that sound designed for vulvar care, like A& D Original Ointment, baby lotion, or Vagisil, contain chemicals that could irritate or cause contact dermatitis. Avoid using bubble bath, feminine hygiene sprays, or any type of perfumed creams or sprays near the vulva.  In the bathroom, forgo moistened wipes. If you want moisture, use a spray bottle with plain water, and then pat dry. Use soft, white, unscented toilet paper. Avoid repetitive back and forth motion or rubbing. Drink plenty of water and fluids to dilute urine. Concentrated urine can be irritating to the vulvar skin.   When doing laundry, use hypoallergenic laundry detergent that is very mild, such as those made for babies. Avoid using fabric softeners with undergarments and double rinse undergarments.  For sexual intercourse, be sure to use a lubricant that is without additives or preservatives.  Additives like bactericides, spermicides, warming agents, and flavors can cause vulvar irritation. Rinse the vulva after intercourse. A frozen gel pack wrapped in a towel can be used for irritation after exercise or intercourse.   Trigger Point Dry Needling  . What is Trigger Point Dry Needling (DN)? o DN is a physical therapy technique used to treat muscle pain and dysfunction. Specifically, DN helps deactivate muscle trigger points (muscle knots).  o A thin  filiform needle is used to penetrate the skin and stimulate the underlying trigger point. The goal is for a local twitch response (LTR) to occur and for the trigger point to relax. No medication of any kind is injected during the procedure.   . What Does Trigger Point Dry Needling Feel Like?  o The procedure feels different for each individual patient. Some patients report that they do not actually feel the needle enter the skin and overall the process is not painful. Very mild bleeding may occur. However, many patients feel a deep cramping in the muscle in which the needle was inserted. This is the local twitch response.   Marland Kitchen How Will I feel after the treatment? o Soreness is normal, and the onset of soreness may not occur for a few hours. Typically this soreness does not last longer than two days.  o Bruising is uncommon, however; ice can be used to decrease any possible bruising.  o In rare cases feeling tired or nauseous after the treatment is normal. In addition, your symptoms may get worse before they get better, this period will typically not last longer than 24 hours.   . What Can I do After My Treatment? o Increase your hydration by drinking more water for the next 24 hours. o You may place ice or heat on the areas treated that have become sore, however, do not use heat on inflamed or bruised areas. Heat often brings more relief post needling. o You can continue  your regular activities, but vigorous activity is not recommended initially after the treatment for 24 hours. o DN is best combined with other physical therapy such as strengthening, stretching, and other therapies.    [facility] [address], [address] [place], [place] [address] Phone # [phone] Fax [phone]

## 2021-02-13 NOTE — Telephone Encounter (Signed)
DOB verified.  Called pt to inform her that her insurance would not cover botox injections for levator spasm. Advised pt that if she wanted to pay out of pocket for the botox that the price will be $622.  Pt will think about her options and let us know what she decides.

## 2021-02-13 NOTE — Therapy (Signed)
[facility] [address] [place], [place], [address] Phone: [phone]   Fax:  [phone]  Physical Therapy Treatment  Patient Details  Name: Brianna Barr MRN: [id number] Date of Birth: 1995-12-28 Referring Provider (PT): Dr. Lanetta Inch   Encounter Date: 02/13/2021   PT End of Session - 02/13/21 0906    Visit Number 5    Date for PT Re-Evaluation 05/13/21    Authorization Type UHC    Authorization - Visit Number 5    Authorization - Number of Visits 60    PT Start Time 0845    PT Stop Time 0840    PT Time Calculation (min) 1435 min    Activity Tolerance Patient tolerated treatment well;No increased pain    Behavior During Therapy WFL for tasks assessed/performed           Past Medical History:  Diagnosis Date  . Anxiety   . Asthma   . Depression   . Overactive bladder   . Stress incontinence     Past Surgical History:  Procedure Laterality Date  . WISDOM TOOTH EXTRACTION      There were no vitals filed for this visit.   Subjective Assessment - 02/13/21 0853    Subjective I use Good clean love to wash in the vaginal area.I am still on the first dilator due to the second one increases pain. Start with dilator is 3/10 and reduces to 1-2/10 over time. Urinary leakage has decreased by 50%. ureg is less frequent but ust as strong.    Patient Stated Goals decreased pain; decreased incontinence    Currently in Pain? Yes    Pain Score 6     Pain Location Vagina    Pain Orientation Mid    Pain Descriptors / Indicators Aching;Sharp;Sore;Spasm;Tightness    Pain Type Chronic pain    Pain Onset More than a month ago    Pain Frequency Intermittent    Aggravating Factors  vaginal exam, standing, movement with speculum    Pain Relieving Factors rest    Multiple Pain Sites No                          Pelvic Floor Special Questions - 02/13/21 0001    Pelvic Floor Internal Exam  Patient consents and approves PT to assess pelvic floor and treatment    Exam Type Vaginal             [facility] Adult PT Treatment/Exercise - 02/13/21 0001      Self-Care   Self-Care Other Self-Care Comments    Other Self-Care Comments  educated patien ton vulvar care and using desert harvest reveleum to reduce pain in the vulvar area with dilators.      Manual Therapy   Manual Therapy Soft tissue mobilization;Internal Pelvic Floor    Manual therapy comments to assess for dry needling    Soft tissue mobilization to the ischiocavernsosu and superficial transverse to elongate after dry needling    Internal Pelvic Floor to the perineal body to elongate the tissue after dry needling            Trigger Point Dry Needling - 02/13/21 0001    Consent Given? Yes    Education Handout Provided Yes    Dry Needling Comments isciocavernosus, perineal body and superficial transverse   elongation of muscle pallpated , palpable muscle spasm  PT Education - 02/13/21 [phone]    Education Details education on vaginal moisturizers and vaginal care; info on dry needling    Person(s) Educated Patient    Methods Explanation;Demonstration;Verbal cues;Handout    Comprehension Returned demonstration;Verbalized understanding            PT Short Term Goals - 02/13/21 0931      PT SHORT TERM GOAL #4   Title understand vaginal health and how to restore ph balance of the vagina    Time 4    Period Weeks    Status Achieved             PT Long Term Goals - 02/06/21 5732      PT LONG TERM GOAL #1   Title independent with advanced HEP    Time 4    Period Months    Status On-going      PT LONG TERM GOAL #2   Title able to tolerate a vaginal exam due to the ability to use the largest dilator and elongate the vaginal canal with diaphragmatic breathing    Time 4    Period Months    Status On-going      PT LONG TERM GOAL #3   Title pelvic pain decreased >/= 80% due to the  ability to elongate the vaginal canal with largest dilator    Time 4    Period Months    Status On-going      PT LONG TERM GOAL #4   Title urinary leakage decreased >/= 80% due to decreased over active bladder activity and understands ways to calm the bladder down    Time 4    Period Months    Status On-going                 Plan - 02/13/21 0906    Clinical Impression Statement Patient reports her leakage is 50% better. Her urge to void is less frequent but the same intensity. Patient was able to tolerate the therapist to place her index finger into the vaginal canal to work on the perineal body. Patient is still on the first dilator due to the increased pain with the second dilator. Patient will benefit from skilled therapy to reduce pelvic pain, improve pelvic floor coordination, improve tissue mobilty and quality of lift.    Personal Factors and Comorbidities Fitness;Past/Current Experience;Time since onset of injury/illness/exacerbation    Examination-Activity Limitations Continence;Toileting;Bed Mobility;Squat;Lift;Bend    Examination-Participation Restrictions Interpersonal Relationship;Community Activity    Stability/Clinical Decision Making Stable/Uncomplicated    Rehab Potential Excellent    PT Duration Other (comment)   4 months   PT Treatment/Interventions ADLs/Self Care Home Management;Biofeedback;Cryotherapy;Electrical Stimulation;Moist Heat;Ultrasound;Therapeutic activities;Therapeutic exercise;Neuromuscular re-education;Patient/family education;Manual techniques;Passive range of motion;Dry needling;Taping;Spinal Manipulations;Joint Manipulations    PT Next Visit Plan assess dry needling to the perineal body and levator ani, see about internal work after dry needling    PT Home Exercise Plan Access Code: [email or web address]    Consulted and Agree with Plan of Care Patient           Patient will benefit from skilled therapeutic intervention in order to improve the following  deficits and impairments:  Decreased coordination,Decreased range of motion,Increased fascial restricitons,Decreased endurance,Increased muscle spasms,Decreased activity tolerance,Pain,Impaired flexibility,Decreased strength,Decreased mobility  Visit Diagnosis: Cramp and spasm  Unspecified lack of coordination  Urge incontinence of urine  Vaginismus     Problem List Patient Active Problem List   Diagnosis Date Noted  . Anxiety and depression 07/07/2019  . Mild  intermittent asthma without complication 07/07/2019  . Seasonal allergies 07/07/2019  . Plantar fasciitis 07/07/2019  . Hyperhidrosis 07/07/2019  . OAB (overactive bladder) 07/07/2019  . Allergy 08/06/2018    Eulis Foster, PT 02/13/21 9:32 AM   [facility] [address], [address] [place], [place], [address] Phone: [phone]   Fax:  [phone]  Name: Paylin Hailu MRN: [id number] Date of Birth: 1996-05-10

## 2021-02-14 ENCOUNTER — Encounter (HOSPITAL_BASED_OUTPATIENT_CLINIC_OR_DEPARTMENT_OTHER): Payer: Self-pay | Admitting: *Deleted

## 2021-02-15 ENCOUNTER — Other Ambulatory Visit: Payer: Self-pay

## 2021-02-15 ENCOUNTER — Ambulatory Visit (INDEPENDENT_AMBULATORY_CARE_PROVIDER_SITE_OTHER): Payer: Commercial Managed Care - PPO | Admitting: Family Medicine

## 2021-02-15 ENCOUNTER — Encounter: Payer: Self-pay | Admitting: Family Medicine

## 2021-02-15 VITALS — BP 120/88 | HR 104 | Temp 98.3°F | Wt 231.2 lb

## 2021-02-15 DIAGNOSIS — Z6841 Body Mass Index (BMI) 40.0 and over, adult: Secondary | ICD-10-CM

## 2021-02-15 MED ORDER — LIRAGLUTIDE -WEIGHT MANAGEMENT 18 MG/3ML ~~LOC~~ SOPN: PEN_INJECTOR | SUBCUTANEOUS | 2 refills | Status: DC

## 2021-02-15 NOTE — Progress Notes (Signed)
Subjective:    Patient ID: Brianna Barr, female    DOB: 1995-10-30, 25 y.o.   MRN: [id number]  Chief Complaint  Patient presents with  . Follow-up    HPI Patient was seen today for f/u on weight management.  Initially insurance declined coverage, but approved liraglutide.  Currently taking liraglutide 2.4 mg daily.  Lost 3 pounds since the beginning of April.  Pt notes initially overeating 2/2 thinking she did not want to waste food/being unable to leave the table as a child until she ate everything on her plate.  Exercising, but sprained her ankle last wk. swelling finally went down this week.  Patient tried compression, rest, and other supportive care for her ankle.  Did chair yoga for exercise wall ankle was improving.  Past Medical History:  Diagnosis Date  . Anxiety   . Asthma   . Depression   . Overactive bladder   . Stress incontinence     No Known Allergies  ROS General: Denies fever, chills, night sweats, changes in weight, changes in appetite  + weight loss HEENT: Denies headaches, ear pain, changes in vision, rhinorrhea, sore throat CV: Denies CP, palpitations, SOB, orthopnea Pulm: Denies SOB, cough, wheezing GI: Denies abdominal pain, nausea, vomiting, diarrhea, constipation GU: Denies dysuria, hematuria, frequency, vaginal discharge Msk: Denies muscle cramps, joint pains   + left ankle soreness Neuro: Denies weakness, numbness, tingling Skin: Denies rashes, bruising Psych: Denies depression, anxiety, hallucinations      Objective:    Blood pressure 120/88, pulse (!) 104, temperature 98.3 F (36.8 C), temperature source Oral, weight 231 lb 3.2 oz (104.9 kg), SpO2 99 %.  Gen. Pleasant, well-nourished, in no distress, normal affect   HEENT: [place]/AT, face symmetric, conjunctiva clear, no scleral icterus, PERRLA, EOMI, nares patent without drainage Lungs: no accessory muscle use Cardiovascular: RRR, no peripheral edema Musculoskeletal: No deformities, no  cyanosis or clubbing, normal tone Neuro:  A&Ox3, CN II-XII intact, normal gait Skin:  Warm, no lesions/ rash   Wt Readings from Last 3 Encounters:  02/15/21 231 lb 3.2 oz (104.9 kg)  02/01/21 234 lb (106.1 kg)  01/29/21 234 lb 9.6 oz (106.4 kg)    Lab Results  Component Value Date   WBC 10.2 10/03/2020   HGB 11.8 10/03/2020   HCT 37.6 10/03/2020   PLT 436 (H) 10/03/2020   GLUCOSE 84 08/18/2019   NA 140 08/18/2019   K 4.0 08/18/2019   CL 106 08/18/2019   CREATININE 0.86 08/18/2019   BUN 11 08/18/2019   CO2 24 08/18/2019   TSH 4.14 11/28/2020   HGBA1C 5.4 08/18/2019    Assessment/Plan:  Class 3 severe obesity due to excess calories with serious comorbidity and body mass index (BMI) of 40.0 to 44.9 in adult (HCC) -3 lbs wt loss -continue increasing dose of liraglutide by 0.6 mg wkly for a max dose of 3.0 mg -continue lifestyle modifications  F/u in 6-8 wks  Abbe Amsterdam, MD

## 2021-02-18 ENCOUNTER — Other Ambulatory Visit: Payer: Self-pay

## 2021-02-18 ENCOUNTER — Ambulatory Visit (INDEPENDENT_AMBULATORY_CARE_PROVIDER_SITE_OTHER): Payer: 59 | Admitting: Psychiatry

## 2021-02-18 DIAGNOSIS — F411 Generalized anxiety disorder: Secondary | ICD-10-CM | POA: Diagnosis not present

## 2021-02-18 NOTE — Progress Notes (Signed)
[facility] Counselor/Therapist Progress Note  Patient ID: Brianna Barr, MRN: [id number],    Date: 02/18/2021  Time Spent: 60 minutes   9:00am to 10:00am   Treatment Type: Individual Therapy  Reported Symptoms: anxiety, depression, "anxiety is the stronger one"  Mental Status Exam:  Appearance:   Casual     Behavior:  Appropriate, Sharing and Motivated  Motor:  Normal  Speech/Language:   Clear and Coherent  Affect:  anxious, depressed  Mood:  anxious and depressed  Thought process:  goal directed  Thought content:    WNL  Sensory/Perceptual disturbances:    WNL  Orientation:  oriented to person, place, time/date, situation, day of week, month of year and year  Attention:  Good  Concentration:  Good and Fair  Memory:  WNL  Fund of knowledge:   Good  Insight:    Good  Judgment:   Good  Impulse Control:  Good   Risk Assessment: Danger to Self:  No Self-injurious Behavior: No Danger to Others: No Duty to Warn:no Physical Aggression / Violence:No  Access to Firearms a concern: No  Gang Involvement:No   Subjective: Today reports anxiety and depression, with anxiety being the stronger symptom. (See Progress Note below.)   below.)Interventions: Cognitive Behavioral Therapy and Solution-Oriented/Positive Psychology  Diagnosis:   ICD-10-CM   1. Generalized anxiety disorder  F41.1     Plan of Care: Patient not signing tx plan on computer screen due to COVID.   Treatment Goals: Goals will remain on tx plan as patient works with strategies to meet her goals. Progress will be noted each session and documented in "Progress" section of Plan.  Long term goal: Reduce overalllevel, frequency, and intensity of the anxiety so that daily functioning is not impaired.  Short term goal: Increase understandingof beliefs and messages that produce anxiety, depression, or "worries".  Strategy: Identify, challenge,and replace anxious/depressive/fearful  self-talk with positive, realistic, and empowering self-talk.  Progress: Patient in today reporting anxiety as stronger symptom, along with depression, related to health issues of patient and relationship difficulties with parents. Health issues are a main focus for patient as she is undergoing some very painful treatments to help in bladder control due to pelvic floor issues.  Hurt by parent comments re: her being overweight and processed this in session today and how it's counterproductive for patient. Ends up feeling hurtful for patient, yet feels addressing it may make their relationship worse and she is concerned what their reactions may be, anticipating it would not be helpful. Shared and processed some self-sabotage issues that impact her self-esteem, relationships, and feeling deserving. Questions herself and her ability to meet people and make new friends more easily. "I need to do a better job of separating who I am from what I think negatively about myself and what gets reinforced by my parents and in life when negative/nurtful things occur.  Continued work on her negative self judgment and self talk which we also practiced today with specific examples and patient is to continue working on this between appointments.  Encouraged her anxious thought interruption and replacement with more reality based and positive thoughts, improving the way she defines herself, setting and keeping healthy boundaries with others as needed, staying in contact with friends who are supportive of her, staying in the present and focusing on what she can control, practicing positive self-care and self talk, and realizing the resilience and strength that she is showing in the midst of challenging circumstances in working through  treatment goals to improve her emotional health and becoming healthier and happier.   Goal review and progress/challenges noted with patient.  Next appointment within 2 to 3 weeks.    Mathis Fare, LCSW

## 2021-02-21 ENCOUNTER — Ambulatory Visit: Payer: 59 | Admitting: Physical Therapy

## 2021-02-21 ENCOUNTER — Other Ambulatory Visit: Payer: Self-pay

## 2021-02-21 ENCOUNTER — Encounter: Payer: Self-pay | Admitting: Physical Therapy

## 2021-02-21 DIAGNOSIS — R252 Cramp and spasm: Secondary | ICD-10-CM | POA: Diagnosis not present

## 2021-02-21 DIAGNOSIS — R279 Unspecified lack of coordination: Secondary | ICD-10-CM

## 2021-02-21 DIAGNOSIS — N3941 Urge incontinence: Secondary | ICD-10-CM

## 2021-02-21 DIAGNOSIS — N942 Vaginismus: Secondary | ICD-10-CM

## 2021-02-21 NOTE — Therapy (Signed)
[facility] [address], [address] [place], [place], [address] Phone: [phone]   Fax:  [phone]  Physical Therapy Treatment  Patient Details  Name: Brianna Barr MRN: [id number] Date of Birth: [birth year] Referring Provider (PT): Dr. Lanetta Inch   Encounter Date: 02/21/2021   PT End of Session - 02/21/21 0945    Visit Number 6    Date for PT Re-Evaluation 05/13/21    Authorization Type UHC    Authorization - Visit Number 6    Authorization - Number of Visits 60    PT Start Time 0850    PT Stop Time 0940    PT Time Calculation (min) 50 min    Activity Tolerance Patient tolerated treatment well;No increased pain    Behavior During Therapy WFL for tasks assessed/performed           Past Medical History:  Diagnosis Date  . Anxiety   . Asthma   . Depression   . Overactive bladder   . Stress incontinence     Past Surgical History:  Procedure Laterality Date  . WISDOM TOOTH EXTRACTION      There were no vitals filed for this visit.   Subjective Assessment - 02/21/21 0853    Subjective I fell 1.5 weeks ago when I slid on my steps. I have been to the chiropractor several times after my fall. I had trouble with using the dilator due ot my hips hurt from fall.  I feel on my back sice with left leg under her    Patient Stated Goals decreased pain; decreased incontinence    Currently in Pain? Yes    Pain Score 8     Pain Location Vagina    Pain Orientation Mid    Pain Descriptors / Indicators Aching    Pain Type Chronic pain    Pain Onset More than a month ago    Pain Frequency Intermittent    Aggravating Factors  vaginal exam, standing, movement with speculum    Pain Relieving Factors rest    Multiple Pain Sites No              [facility] PT Assessment - 02/21/21 0001      PROM   Left Hip Flexion 90    Left Hip External Rotation  55      Palpation   SI assessment  ASIS are equal                          [facility] Adult PT Treatment/Exercise - 02/21/21 0001      Self-Care   Self-Care Other Self-Care Comments    Other Self-Care Comments  educated patient on different ways to use her dilator to decrease strain on her hips      Lumbar Exercises: Stretches   Hip Flexor Stretch Right;Left;1 rep;60 seconds    Hip Flexor Stretch Limitations prone on foam roll    ITB Stretch Right;Left;1 rep;60 seconds    ITB Stretch Limitations sidely on the foam roll    Piriformis Stretch Right;Left;1 rep;60 seconds    Piriformis Stretch Limitations suing the foam roll and sitting on it      Manual Therapy   Manual Therapy Joint mobilization;Internal Pelvic Floor    Manual therapy comments manually stretched left hip for rotation, hip adductors, and hip flexion; to assess for dry needling    Joint Mobilization using the mulligan belt moiblzation of the left hip for distraction,  infrerior glide, lateral glide grade 3    Internal Pelvic Floor manual mobilization to the perineal body, superficial transverse, levator ani. along the sides of the bladder, sides of the rectum while monitoring for pain and patinet being anxious            Trigger Point Dry Needling - 02/21/21 0001    Consent Given? Yes    Education Handout Provided Previously provided    Dry Needling Comments perineal body, bilateral superficial transverse   twitch response and elongation of the tissue               PT Education - 02/21/21 0944    Education Details educated patient different ways to use the dilator without straining her hips    Person(s) Educated Patient    Methods Explanation;Demonstration    Comprehension Verbalized understanding            PT Short Term Goals - 02/13/21 0931      PT SHORT TERM GOAL #4   Title understand vaginal health and how to restore ph balance of the vagina    Time 4    Period Weeks    Status Achieved             PT Long Term Goals - 02/21/21 0950       PT LONG TERM GOAL #1   Title independent with advanced HEP    Time 4    Period Months      PT LONG TERM GOAL #2   Title able to tolerate a vaginal exam due to the ability to use the largest dilator and elongate the vaginal canal with diaphragmatic breathing    Time 4    Period Months    Status On-going      PT LONG TERM GOAL #3   Title pelvic pain decreased >/= 80% due to the ability to elongate the vaginal canal with largest dilator    Time 4    Period Months    Status On-going      PT LONG TERM GOAL #4   Title urinary leakage decreased >/= 80% due to decreased over active bladder activity and understands ways to calm the bladder down    Time 4    Period Months    Status On-going      PT LONG TERM GOAL #5   Title not having to wear a pad due to reduction of urinary leakage with activity and able to fully contract and relax her pelvic floor    Time 4    Period Months    Status On-going                 Plan - 02/21/21 0946    Clinical Impression Statement Patient fell 1.5 weeks ago and strained her left hip and lower leg. She had decreased ROM of the left hip but after manual work had full left hip flexion and ER. Patient was able to tolerate internal manual work to the pelvic floor muscles with pain no greater than 3/10. Patient had softening of the perineal body and superficial transverse after the dry needling. Patient is using the dilators but had some difficulty after she fell and injured her left leg so therapist instructed her on other positions to use the dilator. Patient will benefit from skilled therapy to reduce pelvic pain, improve pelvic floor coordination, improve tissue mobility and quality of life.    Personal Factors and Comorbidities Fitness;Past/Current Experience;Time since onset of injury/illness/exacerbation  Examination-Activity Limitations Continence;Toileting;Bed Mobility;Squat;Lift;Bend    Examination-Participation Restrictions Interpersonal  Relationship;Community Activity    Stability/Clinical Decision Making Stable/Uncomplicated    Rehab Potential Excellent    PT Frequency 1x / week    PT Duration Other (comment)   4 months   PT Treatment/Interventions ADLs/Self Care Home Management;Biofeedback;Cryotherapy;Electrical Stimulation;Moist Heat;Ultrasound;Therapeutic activities;Therapeutic exercise;Neuromuscular re-education;Patient/family education;Manual techniques;Passive range of motion;Dry needling;Taping;Spinal Manipulations;Joint Manipulations    PT Next Visit Plan continue to dry needling to the pelvic floor muscles including the levator ani, internal work to the pelvic floor, around the bladder    PT Home Exercise Plan Access Code: [email or web address]    Consulted and Agree with Plan of Care Patient           Patient will benefit from skilled therapeutic intervention in order to improve the following deficits and impairments:  Decreased coordination,Decreased range of motion,Increased fascial restricitons,Decreased endurance,Increased muscle spasms,Decreased activity tolerance,Pain,Impaired flexibility,Decreased strength,Decreased mobility  Visit Diagnosis: Cramp and spasm  Unspecified lack of coordination  Urge incontinence of urine  Vaginismus     Problem List Patient Active Problem List   Diagnosis Date Noted  . Anxiety and depression 07/07/2019  . Mild intermittent asthma without complication 07/07/2019  . Seasonal allergies 07/07/2019  . Plantar fasciitis 07/07/2019  . Hyperhidrosis 07/07/2019  . OAB (overactive bladder) 07/07/2019  . Allergy 08/06/2018    Eulis Foster, PT 02/21/21 9:51 AM   [facility] [address], [address] [place], [place], [address] Phone: [phone]   Fax:  [phone]  Name: Brianna Barr MRN: [id number] Date of Birth: 08/01/1996

## 2021-02-25 ENCOUNTER — Telehealth: Payer: Self-pay | Admitting: Internal Medicine

## 2021-02-25 MED ORDER — LINACLOTIDE 145 MCG PO CAPS: 145.0000 ug | ORAL_CAPSULE | Freq: Every day | ORAL | 3 refills | Status: DC

## 2021-02-25 NOTE — Telephone Encounter (Signed)
Sent in rx for Linzess 

## 2021-02-25 NOTE — Telephone Encounter (Signed)
Patient called said the Linzess medication is working well for her and is requesting we send a script to her pharmacy.

## 2021-02-28 ENCOUNTER — Encounter: Payer: Commercial Managed Care - PPO | Admitting: Physical Therapy

## 2021-03-04 ENCOUNTER — Ambulatory Visit (INDEPENDENT_AMBULATORY_CARE_PROVIDER_SITE_OTHER): Payer: 59 | Admitting: Psychiatry

## 2021-03-04 ENCOUNTER — Other Ambulatory Visit: Payer: Self-pay

## 2021-03-04 DIAGNOSIS — F411 Generalized anxiety disorder: Secondary | ICD-10-CM | POA: Diagnosis not present

## 2021-03-04 NOTE — Progress Notes (Signed)
[facility] Counselor/Therapist Progress Note  Patient ID: Brianna Barr, MRN: [id number],    Date: 03/04/2021  Time Spent: 60 minutes    9:00am to 11:00am  Treatment Type: Individual Therapy  Reported Symptoms: anxiety, depression  Mental Status Exam:  Appearance:   Neat     Behavior:  Appropriate, Sharing and Motivated  Motor:  Normal  Speech/Language:   Clear and Coherent  Affect:  anxiety, depression  Mood:  anxious and depressed  Thought process:  goal directed  Thought content:    obsessiveness  Sensory/Perceptual disturbances:    WNL  Orientation:  oriented to person, place, time/date, situation, day of week, month of year and year  Attention:  Good  Concentration:  Good  Memory:  WNL  Fund of knowledge:   Good  Insight:    Good  Judgment:   Good  Impulse Control:  Good   Risk Assessment: Danger to Self:  No Self-injurious Behavior: No Danger to Others: No Duty to Warn:no Physical Aggression / Violence:No  Access to Firearms a concern: No  Gang Involvement:No   Subjective: Patient today reporting anxiety and depression.  Stressed with some social situations and with serious illness of grandmother with whom patient had been close.  See progress note below.  Interventions: Solution-Oriented/Positive Psychology, Ego-Supportive and Grief Therapy  Diagnosis:   ICD-10-CM   1. Generalized anxiety disorder  F41.1      Plan of Care: Patient not signing tx plan on computer screen due to COVID.   Treatment Goals: Goals will remain on tx plan as patient works with strategies to meet her goals. Progress will be noted each session and documented in "Progress" section of Plan.  Long term goal: Reduce overalllevel, frequency, and intensity of the anxiety so that daily functioning is not impaired.  Short term goal: Increase understandingof beliefs and messages that produce anxiety, depression, or "worries".  Strategy: Identify, challenge,and  replace anxious/depressive/fearful self-talk with positive, realistic, and empowering self-talk.  Progress: Patient in today reporting anxiety, depression, and anticipatory grief as grandmother with whom she has been close is "actively dying" and hospital called family in to say she will not survive. Patient needed session today to process a feelings of grief re: her grandmother after having visited her in ICU this weekend. "Dealing with my own sadness, and watching my dad grieve over his mom is also painful." Work stress is heightened and that is increasing her personal and family stress. Physical issues continue with her bladder and has temporarily stopped her injection treatments but is continuing Physical Therapy. Per last session Patient states she is still open open to relationships but being careful and not self-sabotaging, but still questions herself and trying to interrupt negative thought patterns and self-judgment. Encouraged patient to be patient with herself in her sadness re: grandmother who is not expected to live much longer, and with her responsibilities at work and within family.  Also encouraged her to practice more of the anxious thought interruption/replacement with more positive thoughts, keeping healthy boundaries with others as needed, improving the way she defines herself, staying in contact with friends who are supportive of her, practicing more positive self-care and self talk, staying in the present and focus on what she can control, setting realistic limits with others, and feeling good about the strength and resilience that she shows in the midst of difficult circumstances as she works with her treatment goals to be happier and more emotionally healthy.  Goal review and progress/challenges noted  with patient.  Next appt  within 2 weeks.   Mathis Fare, LCSW

## 2021-03-07 ENCOUNTER — Ambulatory Visit: Payer: 59 | Attending: Obstetrics and Gynecology | Admitting: Physical Therapy

## 2021-03-07 ENCOUNTER — Encounter: Payer: Self-pay | Admitting: Physical Therapy

## 2021-03-07 ENCOUNTER — Other Ambulatory Visit: Payer: Self-pay

## 2021-03-07 DIAGNOSIS — R252 Cramp and spasm: Secondary | ICD-10-CM | POA: Diagnosis not present

## 2021-03-07 DIAGNOSIS — N942 Vaginismus: Secondary | ICD-10-CM | POA: Diagnosis present

## 2021-03-07 DIAGNOSIS — R279 Unspecified lack of coordination: Secondary | ICD-10-CM | POA: Diagnosis present

## 2021-03-07 DIAGNOSIS — N3941 Urge incontinence: Secondary | ICD-10-CM

## 2021-03-07 NOTE — Therapy (Signed)
[facility] [address], [address] [place], [place], [address] Phone: [phone]   Fax:  [phone]  Physical Therapy Treatment  Patient Details  Name: Brianna Barr MRN: [id number] Date of Birth: 1996/01/24 Referring Provider (PT): Dr. Lanetta Inch   Encounter Date: 03/07/2021   PT End of Session - 03/07/21 0928    Visit Number 7    Date for PT Re-Evaluation 05/13/21    Authorization Type UHC    Authorization - Visit Number 7    Authorization - Number of Visits 60    PT Start Time 0845    PT Stop Time 0923    PT Time Calculation (min) 38 min    Activity Tolerance Patient tolerated treatment well;No increased pain    Behavior During Therapy WFL for tasks assessed/performed           Past Medical History:  Diagnosis Date  . Anxiety   . Asthma   . Depression   . Overactive bladder   . Stress incontinence     Past Surgical History:  Procedure Laterality Date  . WISDOM TOOTH EXTRACTION      There were no vitals filed for this visit.   Subjective Assessment - 03/07/21 0848    Subjective Not much has changed. I am on the first one. I have used the lidocaine lubricant. Pain level is 2/10.    Patient Stated Goals decreased pain; decreased incontinence    Currently in Pain? Yes    Pain Score 8    for insertion then will reduce to 0/10.   Pain Location Vagina    Pain Orientation Mid    Pain Descriptors / Indicators Aching    Pain Type Chronic pain    Pain Onset More than a month ago    Pain Frequency Intermittent    Aggravating Factors  vaginal exam, standing, movement with speculum    Pain Relieving Factors rest    Multiple Pain Sites No                             [facility] Adult PT Treatment/Exercise - 03/07/21 0001      Self-Care   Self-Care Other Self-Care Comments    Other Self-Care Comments  discuss using the dilator daily and using the dilator for 30 minutes with the  lidocain lubricant and use for 30 minutes.      Neuro Re-ed    Neuro Re-ed Details  diaphragmatic breathing with blue band around the rib cage to open up the lower rib cage down into the abdomen and then pelvic floor;      Manual Therapy   Manual Therapy Soft tissue mobilization;Myofascial release    Soft tissue mobilization to bilateral hip adductors and ischiocavernosus while monitoring for pain    Myofascial Release fascial release around the bladder, suprapubically, release of the lower abdominals, release of the urogenital diaphgram with one hand on the suprapubic area and other on the sacrum                  PT Education - 03/07/21 0928    Education Details education on using the dilator for 30 minutes and using the lidocain cream and to do daliy    Person(s) Educated Patient    Methods Explanation    Comprehension Verbalized understanding            PT Short Term Goals - 03/07/21 0932  PT SHORT TERM GOAL #1   Title independent with initial HEP    Time 4    Period Weeks    Status Achieved      PT SHORT TERM GOAL #2   Title able to tolerate external palpation to the vulva area with pain level </= 3/10    Time 4    Period Weeks    Status Achieved      PT SHORT TERM GOAL #3   Title using pelvic floor meditation to relax the pelvic floor and decrease her anxiety    Time 4    Period Weeks    Status Achieved      PT SHORT TERM GOAL #4   Title understand vaginal health and how to restore ph balance of the vagina    Time 4    Period Weeks    Status Achieved             PT Long Term Goals - 03/07/21 0932      PT LONG TERM GOAL #1   Title independent with advanced HEP    Time 4    Period Months    Status On-going      PT LONG TERM GOAL #2   Title able to tolerate a vaginal exam due to the ability to use the largest dilator and elongate the vaginal canal with diaphragmatic breathing    Time 4    Period Months    Status On-going      PT LONG  TERM GOAL #3   Title pelvic pain decreased >/= 80% due to the ability to elongate the vaginal canal with largest dilator    Time 4    Period Months    Status On-going      PT LONG TERM GOAL #4   Title urinary leakage decreased >/= 80% due to decreased over active bladder activity and understands ways to calm the bladder down    Baseline decreased by 60%    Time 4    Period Months    Status On-going      PT LONG TERM GOAL #5   Title not having to wear a pad due to reduction of urinary leakage with activity and able to fully contract and relax her pelvic floor    Time 4    Period Months    Status On-going                 Plan - 03/07/21 4098    Clinical Impression Statement Patient is able to urinate every 3-4 hours with increased urine flow. Patient reports her urinary leakage is 605 better. Patient had increased tigthness in the lower abdomen but after manual work there was increased in tissue mobility. Patient had no pain after therapy. Patient pain level with the first dilator is 2/10 so she will begin with the second dilator and use the [employer]. Patient still has difficutly with bowel movement due to tight pelvic floor and IBS. Patient will benefit from skilled therapy to reduce pelvic pain, improve pelvic floor coordinaiton, improve tissue mobility and quality of life.    Personal Factors and Comorbidities Fitness;Past/Current Experience;Time since onset of injury/illness/exacerbation    Examination-Activity Limitations Continence;Toileting;Bed Mobility;Squat;Lift;Bend    Examination-Participation Restrictions Interpersonal Relationship;Community Activity    Stability/Clinical Decision Making Stable/Uncomplicated    Rehab Potential Excellent    PT Frequency 1x / week    PT Duration Other (comment)   4 months   PT Treatment/Interventions ADLs/Self Care Home  Management;Biofeedback;Cryotherapy;Electrical Stimulation;Moist Heat;Ultrasound;Therapeutic  activities;Therapeutic exercise;Neuromuscular re-education;Patient/family education;Manual techniques;Passive range of motion;Dry needling;Taping;Spinal Manipulations;Joint Manipulations    PT Next Visit Plan continue to dry needling to the pelvic floor muscles including the levator ani, internal work to the pelvic floor, around the bladder; see how it is going with the dilator    PT Home Exercise Plan Access Code: [email or web address]    Consulted and Agree with Plan of Care Patient           Patient will benefit from skilled therapeutic intervention in order to improve the following deficits and impairments:  Decreased coordination,Decreased range of motion,Increased fascial restricitons,Decreased endurance,Increased muscle spasms,Decreased activity tolerance,Pain,Impaired flexibility,Decreased strength,Decreased mobility  Visit Diagnosis: Cramp and spasm  Unspecified lack of coordination  Urge incontinence of urine  Vaginismus     Problem List Patient Active Problem List   Diagnosis Date Noted  . Anxiety and depression 07/07/2019  . Mild intermittent asthma without complication 07/07/2019  . Seasonal allergies 07/07/2019  . Plantar fasciitis 07/07/2019  . Hyperhidrosis 07/07/2019  . OAB (overactive bladder) 07/07/2019  . Allergy 08/06/2018   Eulis Foster, PT 03/07/21 9:33 AM   [facility] [address] [place], [place], [address] Phone: [phone]   Fax:  [phone]  Name: Brianna Barr MRN: [id number] Date of Birth: 04/27/1996

## 2021-03-13 ENCOUNTER — Other Ambulatory Visit: Payer: Self-pay

## 2021-03-13 ENCOUNTER — Ambulatory Visit (INDEPENDENT_AMBULATORY_CARE_PROVIDER_SITE_OTHER): Payer: Commercial Managed Care - PPO | Admitting: Family Medicine

## 2021-03-13 ENCOUNTER — Encounter: Payer: Self-pay | Admitting: Family Medicine

## 2021-03-13 VITALS — BP 110/60 | HR 109 | Temp 98.1°F | Wt 226.0 lb

## 2021-03-13 DIAGNOSIS — L906 Striae atrophicae: Secondary | ICD-10-CM | POA: Diagnosis not present

## 2021-03-13 DIAGNOSIS — R55 Syncope and collapse: Secondary | ICD-10-CM | POA: Diagnosis not present

## 2021-03-13 DIAGNOSIS — R233 Spontaneous ecchymoses: Secondary | ICD-10-CM | POA: Diagnosis not present

## 2021-03-13 DIAGNOSIS — L304 Erythema intertrigo: Secondary | ICD-10-CM | POA: Diagnosis not present

## 2021-03-13 DIAGNOSIS — R319 Hematuria, unspecified: Secondary | ICD-10-CM

## 2021-03-13 DIAGNOSIS — I959 Hypotension, unspecified: Secondary | ICD-10-CM | POA: Diagnosis not present

## 2021-03-13 DIAGNOSIS — Z6841 Body Mass Index (BMI) 40.0 and over, adult: Secondary | ICD-10-CM

## 2021-03-13 DIAGNOSIS — J302 Other seasonal allergic rhinitis: Secondary | ICD-10-CM

## 2021-03-13 DIAGNOSIS — R42 Dizziness and giddiness: Secondary | ICD-10-CM

## 2021-03-13 LAB — POCT URINALYSIS DIPSTICK
Bilirubin, UA: NEGATIVE
Glucose, UA: NEGATIVE
Ketones, UA: NEGATIVE
Nitrite, UA: NEGATIVE
Protein, UA: POSITIVE — AB
Spec Grav, UA: 1.025 (ref 1.010–1.025)
Urobilinogen, UA: NEGATIVE E.U./dL — AB
pH, UA: 6 (ref 5.0–8.0)

## 2021-03-13 MED ORDER — KETOCONAZOLE 2 % EX CREA: 1.0000 "application " | TOPICAL_CREAM | Freq: Every day | CUTANEOUS | 0 refills | Status: DC

## 2021-03-13 MED ORDER — NYSTATIN 100000 UNIT/GM EX POWD: 1.0000 "application " | Freq: Two times a day (BID) | CUTANEOUS | 0 refills | Status: DC

## 2021-03-13 MED ORDER — MONTELUKAST SODIUM 10 MG PO TABS: 10.0000 mg | ORAL_TABLET | Freq: Every day | ORAL | 4 refills | Status: DC

## 2021-03-13 NOTE — Progress Notes (Signed)
Subjective:    Patient ID: Brianna Barr, female    DOB: [birth year], 25 y.o.   MRN: [id number]  Chief Complaint  Patient presents with  . Rash    Rash started 8 days ago, on lt thigh. Has since had vertigo symptoms, stated she is not dure if they are related. Also had a rash on lower rt side of stomach used hydrocortisone ream and aloe vera gel on stomach and it helped with itching but not redness    HPI Patient was seen today for several ongoing concerns as mentioned above.  Pt noticed a nonpruritic petechial rash on L thigh.  She then noticed another rash on her stomach that had a burning sensation to it.  Pt tried cortisone cream and aloe vera gel on it which helped with the itching but not the redness.  Pt also notes syncopal episode while at work.  The room was spinning.  States bp was 83/46.  Typically drinking 1 L of water per day.  Denies changes in foods, soaps, lotions, detergents or medications.  Patient stopped taking liraglutide 2.4 mg for the last 6 days 2/2 current concerns.  Pt weight 231 lbs on 4/22 and is currently 226 lbs.  Pt restarted Linzess 2 months ago, now having 2-4 loose stools per day instead of 6-8.  Having increased seasonal allergy symptoms.  Typically takes OTC meds if needed.  Past Medical History:  Diagnosis Date  . Anxiety   . Asthma   . Depression   . Overactive bladder   . Stress incontinence     No Known Allergies  ROS General: Denies fever, chills, night sweats, changes in weight, changes in appetite +syncope HEENT: Denies headaches, ear pain, changes in vision, rhinorrhea, sore throat CV: Denies CP, palpitations, SOB, orthopnea Pulm: Denies SOB, cough, wheezing GI: Denies abdominal pain, nausea, vomiting, diarrhea, constipation  + loose stools GU: Denies dysuria, hematuria, frequency, vaginal discharge Msk: Denies muscle cramps, joint pains Neuro: Denies weakness, numbness, tingling Skin: Denies bruising  + rashes Psych: Denies depression,  anxiety, hallucinations     Objective:    Blood pressure 110/60, pulse (!) 109, temperature 98.1 F (36.7 C), temperature source Oral, weight 226 lb (102.5 kg), SpO2 98 %.  Gen. Pleasant, well-nourished, in no distress, normal affect   HEENT: [place]/AT, face symmetric, conjunctiva clear, no scleral icterus, PERRLA, EOMI, nares patent without drainage, pharynx without erythema or exudate. Neck: No JVD, no thyromegaly, no carotid bruits Lungs: no accessory muscle use, CTAB, no wheezes or rales Cardiovascular: RRR, no m/r/g, no peripheral edema Abdomen: BS present, soft, NT/ND, no hepatosplenomegaly. Musculoskeletal: No deformities, no cyanosis or clubbing, normal tone Neuro:  A&Ox3, CN II-XII intact, normal gait Skin:  Warm, dry, intact.  Right lateral abdomen with light purple striae.  Satellite lesions and erythematous plaques in pannus.  Left proximal thigh with nonblanching pinpoint erythematous petechial-like lesions  Wt Readings from Last 3 Encounters:  02/15/21 231 lb 3.2 oz (104.9 kg)  02/01/21 234 lb (106.1 kg)  01/29/21 234 lb 9.6 oz (106.4 kg)    Lab Results  Component Value Date   WBC 10.2 10/03/2020   HGB 11.8 10/03/2020   HCT 37.6 10/03/2020   PLT 436 (H) 10/03/2020   GLUCOSE 84 08/18/2019   NA 140 08/18/2019   K 4.0 08/18/2019   CL 106 08/18/2019   CREATININE 0.86 08/18/2019   BUN 11 08/18/2019   CO2 24 08/18/2019   TSH 4.14 11/28/2020   HGBA1C 5.4 08/18/2019  Assessment/Plan:  Intertrigo -Discussed wearing moisture wicking fabrics  - Plan: nystatin (MYCOSTATIN/NYSTOP) powder, ketoconazole (NIZORAL) 2 % cream  Vertigo -Discussed possible causes including dehydration, recent illness, malposition of otolith, etc. -Given handout on Epley maneuver - Plan: CMP  Striae purple  -Patient encouraged to avoid using cortisone cream on abdomen -Discussed using a good moisturizing lotion -Given new striae and other symptoms consider a.m. cortisol level depending  on results of labs obtained this visit - Plan: CMP  Petechiae  - Plan: POCT urinalysis dipstick  Hypotension, unspecified hypotension type -Patient encouraged to increase p.o. intake of water and fluids -Other lifestyle modifications encouraged  - Plan: POCT urinalysis dipstick, CMP  Seasonal allergies  - Plan: montelukast (SINGULAIR) 10 MG tablet  Syncope, unspecified syncope type  -Obtain orthostatic BPs -Discussed various causes including hypotension, hypoglycemia, etc. -We will obtain labs -Patient encouraged to eat regular meals -We will continue to hold liraglutide - Plan: POCT urinalysis dipstick, CMP, CBC with Differential/Platelets, Hemoglobin A1c, T4, Free, TSH, CANCELED: Hemoglobin A1c  Class 3 severe obesity without serious comorbidity with body mass index (BMI) of 40.0 to 44.9 in adult, unspecified obesity type (HCC) -Continue lifestyle modifications -We will hold liraglutide 2.4 mg until resolution of current symptoms including syncope  Hematuria, unspecified type -UA dark yellow in color, trace leuks, protein, SG 1.025, 2+ RBCs -We will obtain UCx.  Update not enough urine obtained for culture.  Can recollect and also obtain micro  - Plan: Urine Culture  F/u as needed  Abbe Amsterdam, MD

## 2021-03-13 NOTE — Patient Instructions (Addendum)
Continue to hold liraglutide injections until you are feeling better/we get lab results.   Happy Birthday!!!   Intertrigo Intertrigo is skin irritation or inflammation (dermatitis) that occurs when folds of skin rub together. The irritation can cause a rash and make skin raw and itchy. This condition most commonly occurs in the skin folds of these areas:  Toes.  Armpits.  Groin.  Under the belly.  Under the breasts.  Buttocks. Intertrigo is not passed from person to person (is not contagious). What are the causes? This condition is caused by heat, moisture, rubbing (friction), and not enough air circulation. The condition can be made worse by:  Sweat.  Bacteria.  A fungus, such as yeast. What increases the risk? This condition is more likely to occur if you have moisture in your skin folds. You are more likely to develop this condition if you:  Have diabetes.  Are overweight.  Are not able to move around or are not active.  Live in a warm and moist climate.  Wear splints, braces, or other medical devices.  Are not able to control your bowels or bladder (have incontinence). What are the signs or symptoms? Symptoms of this condition include:  A pink or red skin rash in the skin fold or near the skin fold.  Raw or scaly skin.  Itchiness.  A burning feeling.  Bleeding.  Leaking fluid.  A bad smell. How is this diagnosed? This condition is diagnosed with a medical history and physical exam. You may also have a skin swab to test for bacteria or a fungus. How is this treated? This condition may be treated by:  Cleaning and drying your skin.  Taking an antibiotic medicine or using an antibiotic skin cream for a bacterial infection.  Using an antifungal cream on your skin or taking pills for an infection that was caused by a fungus, such as yeast.  Using a steroid ointment to relieve itchiness and irritation.  Separating the skin fold with a clean cotton  cloth to absorb moisture and allow air to flow into the area. Follow these instructions at home:  Keep the affected area clean and dry.  Do not scratch your skin.  Stay in a cool environment as much as possible. Use an air conditioner or fan, if available.  Apply over-the-counter and prescription medicines only as told by your health care provider.  If you were prescribed an antibiotic medicine, use it as told by your health care provider. Do not stop using the antibiotic even if your condition improves.  Keep all follow-up visits as told by your health care provider. This is important. How is this prevented?  Maintain a healthy weight.  Take care of your feet, especially if you have diabetes. Foot care includes: ? Wearing shoes that fit well. ? Keeping your feet dry. ? Wearing clean, breathable socks.  Protect the skin around your groin and buttocks, especially if you have incontinence. Skin protection includes: ? Following a regular cleaning routine. ? Using skin protectant creams, powders, or ointments. ? Changing protection pads frequently.  Do not wear tight clothes. Wear clothes that are loose, absorbent, and made of cotton.  Wear a bra that gives good support, if needed.  Shower and dry yourself well after activity or exercise. Use a hair dryer on a cool setting to dry between skin folds, especially after you bathe.  If you have diabetes, keep your blood sugar under control.   Contact a health care provider if:  Your  symptoms do not improve with treatment.  Your symptoms get worse or they spread.  You notice increased redness and warmth.  You have a fever. Summary  Intertrigo is skin irritation or inflammation (dermatitis) that occurs when folds of skin rub together.  This condition is caused by heat, moisture, rubbing (friction), and not enough air circulation.  This condition may be treated by cleaning and drying your skin and with medicines.  Apply  over-the-counter and prescription medicines only as told by your health care provider.  Keep all follow-up visits as told by your health care provider. This is important. This information is not intended to replace advice given to you by your health care provider. Make sure you discuss any questions you have with your health care provider. Document Revised: 03/15/2018 Document Reviewed: 03/15/2018 Elsevier Patient Education  2021 [employer]  Syncope  Syncope refers to a condition in which a person temporarily loses consciousness. Syncope may also be called fainting or passing out. It is caused by a sudden decrease in blood flow to the brain. Even though most causes of syncope are not dangerous, syncope can be a sign of a serious medical problem. Your health care provider may do tests to find the reason why you are having syncope. Signs that you may be about to faint include:  Feeling dizzy or light-headed.  Feeling nauseous.  Seeing all white or all black in your field of vision.  Having cold, clammy skin. If you faint, get medical help right away. Call your local emergency services (911 in the [place] Do not drive yourself to the hospital. Follow these instructions at home: Pay attention to any changes in your symptoms. Take these actions to stay safe and to help relieve your symptoms: Lifestyle  Do not drive, use machinery, or play sports until your health care provider says it is okay.  Do not drink alcohol.  Do not use any products that contain nicotine or tobacco, such as cigarettes and e-cigarettes. If you need help quitting, ask your health care provider.  Drink enough fluid to keep your urine pale yellow. General instructions  Take over-the-counter and prescription medicines only as told by your health care provider.  If you are taking blood pressure or heart medicine, get up slowly and take several minutes to sit and then stand. This can reduce dizziness or  light-headedness.  Have someone stay with you until you feel stable.  If you start to feel like you might faint, lie down right away and raise (elevate) your feet above the level of your heart. Breathe deeply and steadily. Wait until all the symptoms have passed.  Keep all follow-up visits as told by your health care provider. This is important. Get help right away if you:  Have a severe headache.  Faint once or repeatedly.  Have pain in your chest, abdomen, or back.  Have a very fast or irregular heartbeat (palpitations).  Have pain when you breathe.  Are bleeding from your mouth or rectum, or you have black or tarry stool.  Have a seizure.  Are confused.  Have trouble walking.  Have severe weakness.  Have vision problems. These symptoms may represent a serious problem that is an emergency. Do not wait to see if your symptoms will go away. Get medical help right away. Call your local emergency services (911 in the [place] Do not drive yourself to the hospital. Summary  Syncope refers to a condition in which a person temporarily loses consciousness. It is caused  by a sudden decrease in blood flow to the brain.  Signs that you may be about to faint include dizziness, feeling light-headed, feeling nauseous, sudden vision changes, or cold, clammy skin.  Although most causes of syncope are not dangerous, syncope can be a sign of a serious medical problem. If you faint, get medical help right away. This information is not intended to replace advice given to you by your health care provider. Make sure you discuss any questions you have with your health care provider. Document Revised: 02/23/2020 Document Reviewed: 02/23/2020 Elsevier Patient Education  2021 [employer]  Writer marks are streaks or lines that appear on the skin because your skin stretched too quickly. They may show up on your stomach, buttocks, breasts, or thighs. The streaks or lines may feel  itchy and appear pink, red, or purple. Over time, the stretch marks may lose their color and look white or silvery. Stretch marks do not go away, but they can fade or become less noticeable with time or with treatment. Women get stretch marks more often than men. They can also run in the family. Stretch marks are most common in:  Pregnant women.  People who gain weight quickly.  Boys and girls going through puberty.  People who use steroid creams or take steroid drugs.  Bodybuilders who build muscles too quickly.  People with certain conditions, such as Cushing's syndrome or Marfan syndrome. Follow these instructions at home: General instructions  Use skin lotion, creams, or gels to moisturize your skin. Massage them into your skin. Try to moisturize often.  Use prescribed cream or lotions as told by your health care provider.  Eat a healthy and balanced diet with enough vitamins and minerals to keep your skin healthy.  Talk with your health care provider about treatments that can help fade stretch marks, if needed. Ways to lower your chances of getting stretch marks  Maintain a healthy weight and avoid losing or gaining weight quickly.  If you are pregnant, work with your health care provider to make sure that your weight gain is in a healthy range.  Avoid bulking up too quickly if you are exercising to build muscle.  Take steroids and use steroid creams only as told by your health care provider.   Contact a health care provider if:  You would like to know what treatment options are available to help fade your stretch marks. Summary  Stretch marks are streaks or lines that appear on your skin because the skin stretched too quickly. They may show up on your stomach, buttocks, breasts, or thighs.  Stretch marks do not go away, but they may fade with time or treatment.  Talk with your health care provider about treatments that can help fade stretch marks, if needed. This  information is not intended to replace advice given to you by your health care provider. Make sure you discuss any questions you have with your health care provider. Document Revised: 08/09/2020 Document Reviewed: 08/09/2020 Elsevier Patient Education  2021 [employer]  Allergies, Adult An allergy is a condition in which the body's defense system (immune system) comes in contact with an allergen and reacts to it. An allergen is anything that causes an allergic reaction. Allergens cause the immune system to make proteins for fighting infections (antibodies). These antibodies cause cells to release chemicals called histamines that set off the symptoms of an allergic reaction. Allergies often affect the nasal passages (allergic rhinitis), eyes (allergic conjunctivitis), skin (atopic dermatitis),  and stomach. Allergies can be mild, moderate, or severe. They cannot spread from person to person. Allergies can develop at any age and may be outgrown. What are the causes? This condition is caused by allergens. Common allergens include:  Outdoor allergens, such as pollen, car fumes, and mold.  Indoor allergens, such as dust, smoke, mold, and pet dander.  Other allergens, such as foods, medicines, scents, insect bites or stings, and other skin irritants. What increases the risk? You are more likely to develop this condition if you have:  Family members with allergies.  Family members who have any condition that may be caused by allergens, such as asthma. This may make you more likely to have other allergies. What are the signs or symptoms? Symptoms of this condition depend on the severity of the allergy. Mild to moderate symptoms  Runny nose, stuffy nose (nasal congestion), or sneezing.  Itchy mouth, ears, or throat.  A feeling of mucus dripping down the back of your throat (postnasal drip).  Sore throat.  Itchy, red, watery, or puffy eyes.  Skin rash, or itchy, red, swollen areas of skin  (hives).  Stomach cramps or bloating. Severe symptoms Severe allergies to food, medicine, or insect bites may cause anaphylaxis, which can be life-threatening. Symptoms include:  A red (flushed) face.  Wheezing or coughing.  Swollen lips, tongue, or mouth.  Tight or swollen throat.  Chest pain or tightness, or rapid heartbeat.  Trouble breathing or shortness of breath.  Pain in the abdomen, vomiting, or diarrhea.  Dizziness or fainting. How is this diagnosed? This condition is diagnosed based on your symptoms, your family and medical history, and a physical exam. You may also have tests, including:  Skin tests to see how your skin reacts to allergens that may be causing your symptoms. Tests include: ? Skin prick test. For this test, an allergen is introduced to your body through a small opening in the skin. ? Intradermal skin test. For this test, a small amount of allergen is injected under the first layer of your skin. ? Patch test. For this test, a small amount of allergen is placed on your skin. The area is covered and then checked after a few days.  Blood tests.  A challenge test. For this test, you will eat or breathe in a small amount of allergen to see if you have an allergic reaction. You may also be asked to:  Keep a food diary. This is a record of all the foods, drinks, and symptoms you have in a day.  Try an elimination diet. To do this: ? Remove certain foods from your diet. ? Add those foods back one by one to find out if any foods cause an allergic reaction. How is this treated? Treatment for allergies depends on your symptoms. Treatment may include:  Cold, wet cloths (cold compresses) to soothe itching and swelling.  Eye drops or nasal sprays.  Nasal irrigation to help clear your mucus or keep the nasal passages moist.  A humidifier to add moisture to the air.  Skin creams to treat rashes or itching.  Oral antihistamines or other medicines to block  the reaction or to treat inflammation.  Diet changes to remove foods that cause allergies.  Being exposed again and again to tiny amounts of allergens to help you build a defense against it (tolerance). This is called immunotherapy. Examples include: ? Allergy shot. You receive an injection that contains an allergen. ? Sublingual immunotherapy. You take a small dose  of allergen under your tongue.  Emergency injection for anaphylaxis. You give yourself a shot using a syringe (auto-injector) that contains the amount of medicine you need. Your health care provider will teach you how to give yourself an injection.      Follow these instructions at home: Medicines  Take or apply over-the-counter and prescription medicines only as told by your health care provider.  Always carry your auto-injector pen if you are at risk of anaphylaxis. Give yourself an injection as told by your health care provider.   Eating and drinking  Follow instructions from your health care provider about eating or drinking restrictions.  Drink enough fluid to keep your urine pale yellow. General instructions  Wear a medical alert bracelet or necklace to let others know that you have had anaphylaxis before.  Avoid known allergens whenever possible.  Keep all follow-up visits as told by your health care provider. This is important. Contact a health care provider if:  Your symptoms do not get better with treatment. Get help right away if:  You have symptoms of anaphylaxis. These include: ? Swollen mouth, tongue, or throat. ? Pain or tightness in your chest. ? Trouble breathing or shortness of breath. ? Dizziness or fainting. ? Severe abdominal pain, vomiting, or diarrhea. These symptoms may represent a serious problem that is an emergency. Do not wait to see if the symptoms will go away. Get medical help right away. Call your local emergency services (911 in the [place] Do not drive yourself to the  hospital. Summary  Take or apply over-the-counter and prescription medicines only as told by your health care provider.  Avoid known allergens when possible.  Always carry your auto-injector pen if you are at risk of anaphylaxis. Give yourself an injection as told by your health care provider.  Wear a medical alert bracelet or necklace to let others know that you have had anaphylaxis before.  Anaphylaxis is a life-threatening emergency. Get help right away. This information is not intended to replace advice given to you by your health care provider. Make sure you discuss any questions you have with your health care provider. Document Revised: 06/11/2020 Document Reviewed: 08/24/2019 Elsevier Patient Education  2021 [employer] Inc.  How to Perform the Epley Maneuver The Epley maneuver is an exercise that relieves symptoms of vertigo. Vertigo is the feeling that you or your surroundings are moving when they are not. When you feel vertigo, you may feel like the room is spinning and may have trouble walking. The Epley maneuver is used for a type of vertigo caused by a calcium deposit in a part of the inner ear. The maneuver involves changing head positions to help the deposit move out of the area. You can do this maneuver at home whenever you have symptoms of vertigo. You can repeat it in 24 hours if your vertigo has not gone away. Even though the Epley maneuver may relieve your vertigo for a few weeks, it is possible that your symptoms will return. This maneuver relieves vertigo, but it does not relieve dizziness. What are the risks? If it is done correctly, the Epley maneuver is considered safe. Sometimes it can lead to dizziness or nausea that goes away after a short time. If you develop other symptoms--such as changes in vision, weakness, or numbness--stop doing the maneuver and call your health care provider. Supplies needed:  A bed or table.  A pillow. How to do the Epley maneuver 1. Sit  on the edge of a  bed or table with your back straight and your legs extended or hanging over the edge of the bed or table. 2. Turn your head halfway toward the affected ear or side as told by your health care provider. 3. Lie backward quickly with your head turned until you are lying flat on your back. You may want to position a pillow under your shoulders. 4. Hold this position for at least 30 seconds. If you feel dizzy or have symptoms of vertigo, continue to hold the position until the symptoms stop. 5. Turn your head to the opposite direction until your unaffected ear is facing the floor. 6. Hold this position for at least 30 seconds. If you feel dizzy or have symptoms of vertigo, continue to hold the position until the symptoms stop. 7. Turn your whole body to the same side as your head so that you are positioned on your side. Your head will now be nearly facedown. Hold for at least 30 seconds. If you feel dizzy or have symptoms of vertigo, continue to hold the position until the symptoms stop. 8. Sit back up. You can repeat the maneuver in 24 hours if your vertigo does not go away.      Follow these instructions at home: For 24 hours after doing the Epley maneuver:  Keep your head in an upright position.  When lying down to sleep or rest, keep your head raised (elevated) with two or more pillows.  Avoid excessive neck movements. Activity  Do not drive or use machinery if you feel dizzy.  After doing the Epley maneuver, return to your normal activities as told by your health care provider. Ask your health care provider what activities are safe for you. General instructions  Drink enough fluid to keep your urine pale yellow.  Do not drink alcohol.  Take over-the-counter and prescription medicines only as told by your health care provider.  Keep all follow-up visits as told by your health care provider. This is important. Preventing vertigo symptoms Ask your health care provider  if there is anything you should do at home to prevent vertigo. He or she may recommend that you:  Keep your head elevated with two or more pillows while you sleep.  Do not sleep on the side of your affected ear.  Get up slowly from bed.  Avoid sudden movements during the day.  Avoid extreme head positions or movement, such as looking up or bending over. Contact a health care provider if:  Your vertigo gets worse.  You have other symptoms, including: ? Nausea. ? Vomiting. ? Headache. Get help right away if you:  Have vision changes.  Have a headache or neck pain that is severe or getting worse.  Cannot stop vomiting.  Have new numbness or weakness in any part of your body. Summary  Vertigo is the feeling that you or your surroundings are moving when they are not.  The Epley maneuver is an exercise that relieves symptoms of vertigo.  If the Epley maneuver is done correctly, it is considered safe and relieves vertigo quickly. This information is not intended to replace advice given to you by your health care provider. Make sure you discuss any questions you have with your health care provider. Document Revised: 08/10/2019 Document Reviewed: 08/10/2019 Elsevier Patient Education  2021 [employer].

## 2021-03-14 ENCOUNTER — Emergency Department (INDEPENDENT_AMBULATORY_CARE_PROVIDER_SITE_OTHER)
Admission: RE | Admit: 2021-03-14 | Discharge: 2021-03-14 | Disposition: A | Discharge status: Home / Self Care | Payer: 59 | Source: Ambulatory Visit

## 2021-03-14 ENCOUNTER — Encounter: Payer: Commercial Managed Care - PPO | Admitting: Physical Therapy

## 2021-03-14 ENCOUNTER — Other Ambulatory Visit: Payer: Commercial Managed Care - PPO

## 2021-03-14 VITALS — BP 129/84 | HR 101

## 2021-03-14 DIAGNOSIS — J029 Acute pharyngitis, unspecified: Secondary | ICD-10-CM

## 2021-03-14 DIAGNOSIS — R491 Aphonia: Secondary | ICD-10-CM | POA: Diagnosis not present

## 2021-03-14 LAB — URINE CULTURE
MICRO NUMBER:: 11905817
SPECIMEN QUALITY:: ADEQUATE

## 2021-03-14 LAB — POCT RAPID STREP A (OFFICE): Rapid Strep A Screen: NEGATIVE

## 2021-03-14 MED ORDER — METHYLPREDNISOLONE ACETATE 80 MG/ML IJ SUSP
80.0000 mg | Freq: Once | INTRAMUSCULAR | Status: AC
  Administered 2021-03-14: 80 mg via INTRAMUSCULAR

## 2021-03-14 NOTE — ED Triage Notes (Signed)
Patient c/o sore throat since this morning.  Patient is not having any other sx's.  Patient has used cough drops and Chlorseptic spray.  Patient is vaccinated.

## 2021-03-14 NOTE — Discharge Instructions (Signed)
Advised/instructed patient on conservative measures for now may alternate between Tylenol (1000 mg) and Ibuprofen (800 mg) for sore throat pain.  Advised patient we will follow-up with throat culture results once returned.

## 2021-03-14 NOTE — ED Provider Notes (Signed)
[facility]    CSN: [id number] Arrival date & time: 03/14/21  1010      History   Chief Complaint Chief Complaint  Patient presents with  . Appointment    Sore throat    HPI Brianna Barr is a 25 y.o. female.   HPI female presents with sore throat and voice loss since this morning.  Denies fever or other symptoms.  Past Medical History:  Diagnosis Date  . Anxiety   . Asthma   . Depression   . Overactive bladder   . Stress incontinence     Patient Active Problem List   Diagnosis Date Noted  . Anxiety and depression 07/07/2019  . Mild intermittent asthma without complication 07/07/2019  . Seasonal allergies 07/07/2019  . Plantar fasciitis 07/07/2019  . Hyperhidrosis 07/07/2019  . OAB (overactive bladder) 07/07/2019  . Allergy 08/06/2018    Past Surgical History:  Procedure Laterality Date  . WISDOM TOOTH EXTRACTION      OB History    Gravida  0   Para  0   Term  0   Preterm  0   AB  0   Living  0     SAB  0   IAB  0   Ectopic  0   Multiple  0   Live Births  0            Home Medications    Prior to Admission medications   Medication Sig Start Date End Date Taking? Authorizing Provider  albuterol (VENTOLIN HFA) 108 (90 Base) MCG/ACT inhaler Inhale 2 puffs into the lungs every 6 (six) hours as needed for wheezing or shortness of breath. 11/09/19  Yes Deeann Saint, MD  diazepam (VALIUM) 5 MG tablet Place 1 tablet vaginally nightly as needed for muscle spasm/ pelvic pain. 10/23/20  Yes Marguerita Beards, MD  Insulin Pen Needle (PEN NEEDLES) 32G X 6 MM MISC Use as directed for subcu injection daily 01/04/21  Yes Deeann Saint, MD  ketoconazole (NIZORAL) 2 % cream Apply 1 application topically daily. 03/13/21  Yes Deeann Saint, MD  linaclotide [facility]) 145 MCG CAPS capsule Take 1 capsule (145 mcg total) by mouth daily before breakfast. 02/25/21  Yes Hilarie Fredrickson, MD  Liraglutide -Weight Management 18 MG/3ML SOPN  Inject 2.4 mg into the skin daily x1 week.  Can by 0.6 mg weekly was tolerated for a max dose of 3.0 mg. 02/15/21  Yes Deeann Saint, MD  montelukast (SINGULAIR) 10 MG tablet Take 1 tablet (10 mg total) by mouth at bedtime. 03/13/21  Yes Deeann Saint, MD  nystatin (MYCOSTATIN/NYSTOP) powder Apply 1 application topically 2 (two) times daily. 03/13/21  Yes Deeann Saint, MD  oxybutynin (DITROPAN-XL) 10 MG 24 hr tablet Take 1 tablet (10 mg total) by mouth daily. 12/28/20  Yes Marguerita Beards, MD  Vitamin D, Ergocalciferol, (DRISDOL) 1.25 MG (50000 UNIT) CAPS capsule Take 1 capsule (50,000 Units total) by mouth every 7 (seven) days. 11/30/20   Deeann Saint, MD    Family History Family History  Problem Relation Age of Onset  . Multiple sclerosis Mother   . Hypertension Father   . Hypertension Maternal Grandmother   . Diabetes Maternal Grandmother   . Heart disease Maternal Grandmother   . Cervical cancer Maternal Grandmother   . Stroke Paternal Grandmother   . Heart disease Paternal Grandmother   . Hypertension Paternal Grandmother   . Diabetes Paternal Grandmother   .  Endometrial cancer Paternal Grandmother   . Stroke Paternal Grandfather   . Colon cancer Neg Hx   . Colon polyps Neg Hx     Social History Social History   Tobacco Use  . Smoking status: Never Smoker  . Smokeless tobacco: Never Used  Vaping Use  . Vaping Use: Never used  Substance Use Topics  . Alcohol use: No  . Drug use: No     Allergies   Patient has no known allergies.   Review of Systems Review of Systems  Constitutional: Negative.   HENT: Positive for sore throat and voice change.   Eyes: Negative.   Respiratory: Negative.   Cardiovascular: Negative.   Gastrointestinal: Negative.   Genitourinary: Negative.   Musculoskeletal: Negative.   Skin: Negative.   Neurological: Negative.      Physical Exam Triage Vital Signs ED Triage Vitals  Enc Vitals Group     BP 03/14/21 1135  129/84     Pulse Rate 03/14/21 1135 (!) 101     Resp --      Temp --      Temp Source 03/14/21 1135 Oral     SpO2 03/14/21 1135 99 %     Weight --      Height --      Head Circumference --      Peak Flow --      Pain Score 03/14/21 1136 7     Pain Loc --      Pain Edu? --      Excl. in GC? --    No data found.  Updated Vital Signs BP 129/84 (BP Location: Left Arm)   Pulse (!) 101   SpO2 99%      Physical Exam Vitals and nursing note reviewed.  Constitutional:      General: She is not in acute distress.    Appearance: She is obese. She is not ill-appearing.  HENT:     Head: Normocephalic and atraumatic.     Right Ear: Tympanic membrane, ear canal and external ear normal.     Left Ear: Tympanic membrane, ear canal and external ear normal.     Nose: Nose normal.     Mouth/Throat:     Mouth: Mucous membranes are moist.     Pharynx: Oropharynx is clear. No oropharyngeal exudate or posterior oropharyngeal erythema.  Eyes:     Extraocular Movements: Extraocular movements intact.     Conjunctiva/sclera: Conjunctivae normal.     Pupils: Pupils are equal, round, and reactive to light.  Cardiovascular:     Rate and Rhythm: Normal rate and regular rhythm.     Pulses: Normal pulses.     Heart sounds: Normal heart sounds.  Pulmonary:     Effort: Pulmonary effort is normal.     Breath sounds: Normal breath sounds.  Musculoskeletal:        General: Normal range of motion.     Cervical back: Normal range of motion and neck supple. Tenderness present.  Lymphadenopathy:     Cervical: Cervical adenopathy present.  Skin:    General: Skin is warm.  Neurological:     General: No focal deficit present.     Mental Status: She is alert and oriented to person, place, and time.  Psychiatric:        Mood and Affect: Mood normal.        Behavior: Behavior normal.      UC Treatments / Results  Labs (all labs ordered are listed, but  only abnormal results are displayed) Labs  Reviewed  CULTURE, GROUP A STREP  POCT RAPID STREP A (OFFICE)    EKG   Radiology No results found.  Procedures Procedures (including critical care time)  Medications Ordered in UC Medications  methylPREDNISolone acetate (DEPO-MEDROL) injection 80 mg (80 mg Intramuscular Given 03/14/21 1245)    Initial Impression / Assessment and Plan / UC Course  I have reviewed the triage vital signs and the nursing notes.  Pertinent labs & imaging results that were available during my care of the patient were reviewed by me and considered in my medical decision making (see chart for details).     MDM: 1.  Pharyngitis, 2.  Voice loss.  Throat culture ordered.  Patient discharged home, hemodynamically stable. Final Clinical Impressions(s) / UC Diagnoses   Final diagnoses:  Sore throat  Loss of voice  Acute pharyngitis, unspecified etiology     Discharge Instructions     Advised/instructed patient on conservative measures for now may alternate between Tylenol (1000 mg) and Ibuprofen (800 mg) for sore throat pain.  Advised patient we will follow-up with throat culture results once returned.    ED Prescriptions    None     PDMP not reviewed this encounter.   Trevor Iha, FNP 03/14/21 1254

## 2021-03-15 LAB — CULTURE, GROUP A STREP

## 2021-03-18 ENCOUNTER — Telehealth: Payer: Self-pay | Admitting: Emergency Medicine

## 2021-03-18 ENCOUNTER — Ambulatory Visit: Payer: 59 | Admitting: Psychiatry

## 2021-03-18 NOTE — Telephone Encounter (Signed)
Spoke with patient states that she is feeling about the same.  Advised patient that a throat culture was ordered by the provider but one was not sent out.  Offered patient to come by and we will be glad to re-swab her.  Patient will continue to monitor herself and if she's no better in a couple of days, she will follow up with PCP or return for re-evaluation.  Patient voices understanding.

## 2021-03-18 NOTE — Progress Notes (Signed)
Patient viewed results on MyChart. 

## 2021-03-21 ENCOUNTER — Encounter: Payer: Self-pay | Admitting: Physical Therapy

## 2021-03-21 ENCOUNTER — Ambulatory Visit: Payer: 59 | Admitting: Physical Therapy

## 2021-03-21 ENCOUNTER — Other Ambulatory Visit: Payer: Self-pay

## 2021-03-21 DIAGNOSIS — R252 Cramp and spasm: Secondary | ICD-10-CM

## 2021-03-21 DIAGNOSIS — R279 Unspecified lack of coordination: Secondary | ICD-10-CM

## 2021-03-21 DIAGNOSIS — N942 Vaginismus: Secondary | ICD-10-CM

## 2021-03-21 DIAGNOSIS — N3941 Urge incontinence: Secondary | ICD-10-CM

## 2021-03-21 NOTE — Therapy (Signed)
[facility] [address] [place], [place], [address] Phone: [phone]   Fax:  [phone]  Physical Therapy Treatment  Patient Details  Name: Brianna Barr MRN: [id number] Date of Birth: [birth year] Referring Provider (PT): Dr. Lanetta Inch   Encounter Date: 03/21/2021   PT End of Session - 03/21/21 0925    Visit Number 8    Date for PT Re-Evaluation 05/13/21    Authorization Type [employer]    Authorization - Visit Number 8    Authorization - Number of Visits 60    PT Start Time 0845    PT Stop Time 0925    PT Time Calculation (min) 40 min    Activity Tolerance Patient tolerated treatment well;No increased pain    Behavior During Therapy WFL for tasks assessed/performed           Past Medical History:  Diagnosis Date  . Anxiety   . Asthma   . Depression   . Overactive bladder   . Stress incontinence     Past Surgical History:  Procedure Laterality Date  . WISDOM TOOTH EXTRACTION      There were no vitals filed for this visit.   Subjective Assessment - 03/21/21 0850    Subjective Since initial eval. Urinary control and less often and being able to hold her urine when she has the urge. The sensitivity is less reactive. The initial pain level starts at 7/10 instead of 9/10 then goes to a 2-3/10. Urinary leakage with less accidents and is improved by 75%.    Patient Stated Goals decreased pain; decreased incontinence    Currently in Pain? Yes    Pain Score --   1/10 doing nothing; dilator work starts 6-7/10 then reduces 2-3/10.   Pain Location Vagina    Pain Orientation Mid    Pain Descriptors / Indicators Sharp;Aching;Sore   dilator starts as sharp and aching then goes to soreness   Pain Type Chronic pain    Pain Onset More than a month ago    Pain Frequency Constant    Aggravating Factors  vaginal exam, using a dilator, speculum    Pain Relieving Factors rest    Multiple Pain Sites No               [facility] PT Assessment - 03/21/21 0001      Assessment   Medical Diagnosis N32.81 overactive bladder; N94.10 Dyspareunia, female; Y60.630 Levator spasm    Referring Provider (PT) Dr. Lanetta Inch      PROM   Right Hip Flexion 120    Left Hip Flexion 120    Left Hip External Rotation  70                      Pelvic Floor Special Questions - 03/21/21 0001    Pelvic Floor Internal Exam Patient consents and approves PT to assess pelvic floor and treatment    Exam Type Rectal             [facility] Adult PT Treatment/Exercise - 03/21/21 0001      Lumbar Exercises: Stretches   Other Lumbar Stretch Exercise hip adductor stretch sitting      Lumbar Exercises: Aerobic   Stationary Bike 7 minutes at evel 1 while assessing patient      Manual Therapy   Manual Therapy Soft tissue mobilization;Internal Pelvic Floor    Soft tissue mobilization using the addaday to the hip adductors and quads  Internal Pelvic Floor manual mobilization to perineal body, and right levator ani to elongate after dry needling            Trigger Point Dry Needling - 03/21/21 0001    Consent Given? Yes    Education Handout Provided Previously provided    Dry Needling Comments perineal body, levator ani on the right   twitch response, tissue elongation                 PT Short Term Goals - 03/07/21 0932      PT SHORT TERM GOAL #1   Title independent with initial HEP    Time 4    Period Weeks    Status Achieved      PT SHORT TERM GOAL #2   Title able to tolerate external palpation to the vulva area with pain level </= 3/10    Time 4    Period Weeks    Status Achieved      PT SHORT TERM GOAL #3   Title using pelvic floor meditation to relax the pelvic floor and decrease her anxiety    Time 4    Period Weeks    Status Achieved      PT SHORT TERM GOAL #4   Title understand vaginal health and how to restore ph balance of the vagina    Time 4    Period Weeks     Status Achieved             PT Long Term Goals - 03/07/21 0932      PT LONG TERM GOAL #1   Title independent with advanced HEP    Time 4    Period Months    Status On-going      PT LONG TERM GOAL #2   Title able to tolerate a vaginal exam due to the ability to use the largest dilator and elongate the vaginal canal with diaphragmatic breathing    Time 4    Period Months    Status On-going      PT LONG TERM GOAL #3   Title pelvic pain decreased >/= 80% due to the ability to elongate the vaginal canal with largest dilator    Time 4    Period Months    Status On-going      PT LONG TERM GOAL #4   Title urinary leakage decreased >/= 80% due to decreased over active bladder activity and understands ways to calm the bladder down    Baseline decreased by 60%    Time 4    Period Months    Status On-going      PT LONG TERM GOAL #5   Title not having to wear a pad due to reduction of urinary leakage with activity and able to fully contract and relax her pelvic floor    Time 4    Period Months    Status On-going                 Plan - 03/21/21 0906    Clinical Impression Statement Patient reports her urinary leakage is 75% better. She is able to wait longer to go to the bathroom when she has the urge. Her sensitivity is less reactive. She will initially start at pain level 6-7/10 and reduce to 2-3/10. She has improved urinary control. Patient did well with the dry needling to the levator ani and perineal body. Patient had some trigger point releases. Patient had less pain, down to 2/10  after the dry needling. Patient will benefit from skilled therapy to reduce pelvic pain, improve pelvic floor coordination, improve tissue mobility and quality of life.    Personal Factors and Comorbidities Fitness;Past/Current Experience;Time since onset of injury/illness/exacerbation    Examination-Activity Limitations Continence;Toileting;Bed Mobility;Squat;Lift;Bend     Examination-Participation Restrictions Interpersonal Relationship;Community Activity    Stability/Clinical Decision Making Stable/Uncomplicated    Rehab Potential Excellent    PT Frequency 1x / week    PT Duration Other (comment)   4 months   PT Treatment/Interventions ADLs/Self Care Home Management;Biofeedback;Cryotherapy;Electrical Stimulation;Moist Heat;Ultrasound;Therapeutic activities;Therapeutic exercise;Neuromuscular re-education;Patient/family education;Manual techniques;Passive range of motion;Dry needling;Taping;Spinal Manipulations;Joint Manipulations    PT Next Visit Plan assess dry needling, work internally, around the bladder, stretch the hip adductors    PT Home Exercise Plan Access Code: [email or web address]    Consulted and Agree with Plan of Care Patient           Patient will benefit from skilled therapeutic intervention in order to improve the following deficits and impairments:  Decreased coordination,Decreased range of motion,Increased fascial restricitons,Decreased endurance,Increased muscle spasms,Decreased activity tolerance,Pain,Impaired flexibility,Decreased strength,Decreased mobility  Visit Diagnosis: Cramp and spasm  Unspecified lack of coordination  Urge incontinence of urine  Vaginismus     Problem List Patient Active Problem List   Diagnosis Date Noted  . Anxiety and depression 07/07/2019  . Mild intermittent asthma without complication 07/07/2019  . Seasonal allergies 07/07/2019  . Plantar fasciitis 07/07/2019  . Hyperhidrosis 07/07/2019  . OAB (overactive bladder) 07/07/2019  . Allergy 08/06/2018    Eulis Foster, PT 03/21/21 9:32 AM   [facility] [address], [address] [place], [place], [address] Phone: [phone]   Fax:  [phone]  Name: Brianna Barr MRN: [id number] Date of Birth: [birth year]

## 2021-03-26 ENCOUNTER — Encounter: Payer: Self-pay | Admitting: Family Medicine

## 2021-04-01 ENCOUNTER — Ambulatory Visit (INDEPENDENT_AMBULATORY_CARE_PROVIDER_SITE_OTHER): Payer: 59 | Admitting: Psychiatry

## 2021-04-01 ENCOUNTER — Other Ambulatory Visit: Payer: Self-pay

## 2021-04-01 DIAGNOSIS — F411 Generalized anxiety disorder: Secondary | ICD-10-CM

## 2021-04-01 NOTE — Progress Notes (Signed)
[facility] Counselor/Therapist Progress Note  Patient ID: Brianna Barr, MRN: [id number],    Date: 04/01/2021  Time Spent: 60 minutes  Treatment Type: Individual Therapy  Reported Symptoms: anxiety, grief re: paternal grandmother's death, tearfulness  Mental Status Exam:  Appearance:   Well Groomed     Behavior:  Appropriate, Rigid and Motivated  Motor:  Normal  Speech/Language:   Clear and Coherent  Affect:  anxious, some depresion, sadness re: family death  Mood:  anxious, depressed and sad, tearful  Thought process:  goal directed  Thought content:    WNL  Sensory/Perceptual disturbances:    WNL  Orientation:  oriented to person, place, time/date, situation, day of week, month of year and year  Attention:  Fair  Concentration:  Fair  Memory:  WNL  Fund of knowledge:   Good  Insight:    Good  Judgment:   Good  Impulse Control:  Good   Risk Assessment: Danger to Self:  No Self-injurious Behavior: No Danger to Others: No Duty to Warn:no Physical Aggression / Violence:No  Access to Firearms a concern: No  Gang Involvement:No   Subjective:  Patient in today reporting anxiety, sadness, and an unexpected issue with "really bad sore throat diagnosed as pharyngitis and went to Urgent Care", and grandmother to whom she was close died and "that was very stressful". "Lots of family dysfunction in the midst of dealing with the death and arrangements after grandmother's death, and that complicated matters for everybody."  Tearfully talked through the family issues these past couple weeks that particularly affected patient. Difficult for her to see her dad grieve, which she processed a lot of those feelings in session today. Feels she is making some progress in her grief. Friends hosted her a 25th birthday party which went well. Work stressors continues and have been harder to manage due to her grief and other family concerns. Physical concerns have leveled out some although  not stopped. Feels that the family communication and stressors recently have been a "big issue" for her and stirring up more anxiety. Worked with her short term and long term goals in tx plan especially in better understanding the beliefs and messages she was experiencing during recent grief and family dysfunction, and worked changing the unhealthy and anxious beliefs to be more reality-based and calming beliefs.  See Progress/Plan below.        Interventions: Cognitive Behavioral Therapy, Solution-Oriented/Positive Psychology and Ego-Supportive  Diagnosis:   ICD-10-CM   1. Generalized anxiety disorder  F41.1      Treatment Goal Plan: Patient not signing tx plan on computer screen due to COVID.  Treatment Goals: Goals will remain on tx plan as patient works with strategies to meet her goals. Progress will be noted each session and documented in "Progress" section of Plan. Long term goal: Reduce overalllevel, frequency, and intensity of the anxiety so that daily functioning is not impaired. Short term goal: Increase understandingof beliefs and messages that produce anxiety, depression, or "worries". Strategy: Identify, challenge,and replace anxious/depressive/fearful self-talk with positive, realistic, and empowering self-talk.   Progress / Plan:  Patient today showed good motivation in sharing and working through the sadness/grief of grandmother's death and the feelings complicated by some family dysfunction.  The patient did a thorough job of talking through these factors which all were complicating her own grief process.  Seemed to feel some peace by the end of session and knows that right now it is a process for her.  Reinforced her anxious thought interruption/replacement with more positive thoughts and encouraged patient to work on defining herself in more positive ways and feel good about herself and how she is managing stressors, keep healthy boundaries with others as needed,  practice more consistent positive self-care and self talk, staying in contact with people who are supportive of her, set realistic boundaries and keep them, stay in the present and focus on what she can control, and to recognize the strength and resilience that she shows and working with goal-directed behaviors in the midst of difficult and often changing circumstances as she works to move forward in a more positive direction and overall improved emotional health.  Goal review and progress/challenges noted with patient.  Next appointment within 2 to 3 weeks.   Mathis Fare, LCSW

## 2021-04-04 ENCOUNTER — Ambulatory Visit (INDEPENDENT_AMBULATORY_CARE_PROVIDER_SITE_OTHER): Payer: 59 | Admitting: Internal Medicine

## 2021-04-04 ENCOUNTER — Encounter: Payer: Self-pay | Admitting: Internal Medicine

## 2021-04-04 VITALS — BP 126/82 | HR 96 | Ht 62.0 in | Wt 229.0 lb

## 2021-04-04 DIAGNOSIS — K581 Irritable bowel syndrome with constipation: Secondary | ICD-10-CM | POA: Diagnosis not present

## 2021-04-04 DIAGNOSIS — R1030 Lower abdominal pain, unspecified: Secondary | ICD-10-CM

## 2021-04-04 MED ORDER — LINACLOTIDE 145 MCG PO CAPS: 145.0000 ug | ORAL_CAPSULE | Freq: Every day | ORAL | 3 refills | Status: AC

## 2021-04-04 NOTE — Patient Instructions (Signed)
If you are age 25 or older, your body mass index should be between 23-30. Your Body mass index is 41.88 kg/m. If this is out of the aforementioned range listed, please consider follow up with your Primary Care Provider.  If you are age 53 or younger, your body mass index should be between 19-25. Your Body mass index is 41.88 kg/m. If this is out of the aformentioned range listed, please consider follow up with your Primary Care Provider.   __________________________________________________________  The [facility] providers would like to encourage you to use [facility] to communicate with providers for non-urgent requests or questions.  Due to long hold times on the telephone, sending your provider a message by [facility] may be a faster and more efficient way to get a response.  Please allow 48 business hours for a response.  Please remember that this is for non-urgent requests.   We have sent the following medications to your pharmacy for you to pick up at your convenience:  Linzess  Please follow up in one year

## 2021-04-04 NOTE — Progress Notes (Signed)
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  See that dictation for details.  She also has a history of pelvic floor dysfunction for which she is undergoing physical therapy.  Patient was given samples of Linzess 145 mcg daily.  Initially she required 290 mcg daily.  This helped.  Currently taking 145 mcg daily.  She reports excellent results with the medication.  He is having up to 3 soft bowel movements per day.  Less abdominal pain.  No bleeding.  She does request medication refill.  REVIEW OF SYSTEMS:  All non-GI ROS negative unless otherwise stated in the HPI except for sinus allergy, anxiety, depression, back pain, sleeping problems, urinary frequency, urinary leakage  Past Medical History:  Diagnosis Date   Anxiety    Asthma    Depression    Overactive bladder    Stress incontinence     Past Surgical History:  Procedure Laterality Date   WISDOM TOOTH EXTRACTION      Social History Brianna Barr  reports that she has never smoked. She has never used smokeless tobacco. She reports that she does not drink alcohol and does not use drugs.  family history includes Cervical cancer in her maternal grandmother; Cirrhosis in her maternal grandfather; Diabetes in her maternal grandmother and paternal grandmother; Endometrial cancer in her paternal grandmother; Heart disease in her maternal grandmother and paternal grandmother; Hypertension in her father, maternal grandmother, and paternal grandmother; Multiple sclerosis in her mother; Stroke in her paternal grandfather and paternal grandmother.  No Known Allergies     PHYSICAL EXAMINATION: Vital signs: BP 126/82   Pulse 96   Ht 5\' 2"  (1.575 m)   Wt 229 lb (103.9 kg)   SpO2 99%   BMI 41.88 kg/m   Constitutional: generally well-appearing, no acute distress Psychiatric: alert and  oriented x3, cooperative Eyes: extraocular movements intact, anicteric, conjunctiva pink Mouth: Mask Neck: supple no lymphadenopathy Cardiovascular: heart regular rate and rhythm, no murmur Lungs: clear to auscultation bilaterally Abdomen: soft, obese, nontender, nondistended, no obvious ascites, no peritoneal signs, normal bowel sounds, no organomegaly Rectal: Omitted Extremities: no clubbing, cyanosis, or lower extremity edema bilaterally Skin: no lesions on visible extremities Neuro: No focal deficits.  Cranial nerves intact  ASSESSMENT:  1.  Constipation predominant irritable bowel syndrome.  Improved on Linzess 145 mcg daily. 2.  Morbid obesity 3.  Pelvic floor dysfunction   PLAN:  1.  Continue Linzess.  Medication risks reviewed.  Prescription refilled. 2.  Continue pelvic floor physical therapy 3.  Routine GI office follow-up 1 year.  Sooner if needed

## 2021-04-11 ENCOUNTER — Other Ambulatory Visit: Payer: Self-pay

## 2021-04-11 ENCOUNTER — Other Ambulatory Visit (INDEPENDENT_AMBULATORY_CARE_PROVIDER_SITE_OTHER): Payer: 59

## 2021-04-11 DIAGNOSIS — L906 Striae atrophicae: Secondary | ICD-10-CM

## 2021-04-11 DIAGNOSIS — I959 Hypotension, unspecified: Secondary | ICD-10-CM | POA: Diagnosis not present

## 2021-04-11 DIAGNOSIS — R55 Syncope and collapse: Secondary | ICD-10-CM | POA: Diagnosis not present

## 2021-04-11 DIAGNOSIS — R42 Dizziness and giddiness: Secondary | ICD-10-CM

## 2021-04-11 LAB — COMPREHENSIVE METABOLIC PANEL
ALT: 10 U/L (ref 0–35)
AST: 9 U/L (ref 0–37)
Albumin: 3.9 g/dL (ref 3.5–5.2)
Alkaline Phosphatase: 76 U/L (ref 39–117)
BUN: 11 mg/dL (ref 6–23)
CO2: 28 mEq/L (ref 19–32)
Calcium: 9.1 mg/dL (ref 8.4–10.5)
Chloride: 104 mEq/L (ref 96–112)
Creatinine, Ser: 0.89 mg/dL (ref 0.40–1.20)
GFR: 90.33 mL/min (ref >60.00)
Glucose, Bld: 87 mg/dL (ref 70–99)
Potassium: 4.5 mEq/L (ref 3.5–5.1)
Sodium: 139 mEq/L (ref 135–145)
Total Bilirubin: 0.3 mg/dL (ref 0.2–1.2)
Total Protein: 6.6 g/dL (ref 6.0–8.3)

## 2021-04-11 LAB — CBC WITH DIFFERENTIAL/PLATELET
Basophils Absolute: 0.1 10*3/uL (ref 0.0–0.1)
Basophils Relative: 0.4 % (ref 0.0–3.0)
Eosinophils Absolute: 0.1 10*3/uL (ref 0.0–0.7)
Eosinophils Relative: 0.7 % (ref 0.0–5.0)
HCT: 39.1 % (ref 36.0–46.0)
Hemoglobin: 12.8 g/dL (ref 12.0–15.0)
Lymphocytes Relative: 26.7 % (ref 12.0–46.0)
Lymphs Abs: 3.3 10*3/uL (ref 0.7–4.0)
MCHC: 32.7 g/dL (ref 30.0–36.0)
MCV: 81.7 fl (ref 78.0–100.0)
Monocytes Absolute: 0.9 10*3/uL (ref 0.1–1.0)
Monocytes Relative: 6.9 % (ref 3.0–12.0)
Neutro Abs: 8.1 10*3/uL — ABNORMAL HIGH (ref 1.4–7.7)
Neutrophils Relative %: 65.3 % (ref 43.0–77.0)
Platelets: 411 10*3/uL — ABNORMAL HIGH (ref 150.0–400.0)
RBC: 4.79 Mil/uL (ref 3.87–5.11)
RDW: 15.4 % (ref 11.5–15.5)
WBC: 12.5 10*3/uL — ABNORMAL HIGH (ref 4.0–10.5)

## 2021-04-11 LAB — HEMOGLOBIN A1C: Hgb A1c MFr Bld: 5.7 % (ref 4.6–6.5)

## 2021-04-11 LAB — T4, FREE: Free T4: 0.85 ng/dL (ref 0.60–1.60)

## 2021-04-11 LAB — TSH: TSH: 6.67 u[IU]/mL — ABNORMAL HIGH (ref 0.35–4.50)

## 2021-04-12 ENCOUNTER — Encounter: Payer: Self-pay | Admitting: Physical Therapy

## 2021-04-12 ENCOUNTER — Ambulatory Visit: Payer: 59 | Attending: Obstetrics and Gynecology | Admitting: Physical Therapy

## 2021-04-12 DIAGNOSIS — R279 Unspecified lack of coordination: Secondary | ICD-10-CM | POA: Diagnosis present

## 2021-04-12 DIAGNOSIS — N942 Vaginismus: Secondary | ICD-10-CM | POA: Diagnosis present

## 2021-04-12 DIAGNOSIS — R252 Cramp and spasm: Secondary | ICD-10-CM | POA: Diagnosis present

## 2021-04-12 DIAGNOSIS — N3941 Urge incontinence: Secondary | ICD-10-CM | POA: Diagnosis present

## 2021-04-12 NOTE — Therapy (Signed)
[facility] [address] [place], [place], [address] Phone: [phone]   Fax:  [phone]  Physical Therapy Treatment  Patient Details  Name: Brianna Barr MRN: [id number] Date of Birth: 09/30/1996 Referring Provider (PT): Dr. Lanetta Inch   Encounter Date: 04/12/2021   PT End of Session - 04/12/21 0928     Visit Number 8    Date for PT Re-Evaluation 05/13/21    Authorization Type [employer]    Authorization - Visit Number 9    Authorization - Number of Visits 60    PT Start Time 0845    PT Stop Time 0930    PT Time Calculation (min) 45 min    Activity Tolerance Patient tolerated treatment well;No increased pain    Behavior During Therapy WFL for tasks assessed/performed             Past Medical History:  Diagnosis Date   Anxiety    Asthma    Depression    Overactive bladder    Stress incontinence     Past Surgical History:  Procedure Laterality Date   WISDOM TOOTH EXTRACTION      There were no vitals filed for this visit.   Subjective Assessment - 04/12/21 0850     Subjective Patient is on the second dilator. Initially the insertion of the dilator is pain level 6/10 then when the dilator is in the pain decreases to a 3/10.    Patient Stated Goals decreased pain; decreased incontinence    Currently in Pain? Yes    Pain Score 4     Pain Location Vagina    Pain Orientation Mid    Pain Descriptors / Indicators Sharp;Aching;Sore    Pain Type Chronic pain    Pain Onset More than a month ago    Pain Frequency Intermittent    Aggravating Factors  vaginal exam, using a dilator or speculum    Pain Relieving Factors rest    Multiple Pain Sites No                            Pelvic Floor Special Questions - 04/12/21 0001     Pelvic Floor Internal Exam Patient consents and approves PT to assess pelvic floor and treatment               [facility] Adult PT  Treatment/Exercise - 04/12/21 0001       Self-Care   Self-Care Other Self-Care Comments    Other Self-Care Comments  discussed with patien ton reducing stress, how to relax the pelvic flor,      Lumbar Exercises: Stretches   Hip Flexor Stretch Right;Left;1 rep;60 seconds    Hip Flexor Stretch Limitations prone on foam roll    ITB Stretch Right;Left;1 rep;60 seconds    ITB Stretch Limitations sidely on the foam roll    Piriformis Stretch Right;Left;1 rep;60 seconds    Piriformis Stretch Limitations sittng on the foam roll    Other Lumbar Stretch Exercise hip adductor stretch sitting      Manual Therapy   Manual Therapy Internal Pelvic Floor    Internal Pelvic Floor manual mobilization to to bulbocavernsosus, ischiocavernosus, and levator ani while monitoring pain                    PT Education - 04/12/21 7253     Education Details discussed with patient on stress release to reduce the pelvic floor  tension    Person(s) Educated Patient    Methods Explanation    Comprehension Verbalized understanding              PT Short Term Goals - 03/07/21 0932       PT SHORT TERM GOAL #1   Title independent with initial HEP    Time 4    Period Weeks    Status Achieved      PT SHORT TERM GOAL #2   Title able to tolerate external palpation to the vulva area with pain level </= 3/10    Time 4    Period Weeks    Status Achieved      PT SHORT TERM GOAL #3   Title using pelvic floor meditation to relax the pelvic floor and decrease her anxiety    Time 4    Period Weeks    Status Achieved      PT SHORT TERM GOAL #4   Title understand vaginal health and how to restore ph balance of the vagina    Time 4    Period Weeks    Status Achieved               PT Long Term Goals - 03/07/21 0932       PT LONG TERM GOAL #1   Title independent with advanced HEP    Time 4    Period Months    Status On-going      PT LONG TERM GOAL #2   Title able to tolerate a  vaginal exam due to the ability to use the largest dilator and elongate the vaginal canal with diaphragmatic breathing    Time 4    Period Months    Status On-going      PT LONG TERM GOAL #3   Title pelvic pain decreased >/= 80% due to the ability to elongate the vaginal canal with largest dilator    Time 4    Period Months    Status On-going      PT LONG TERM GOAL #4   Title urinary leakage decreased >/= 80% due to decreased over active bladder activity and understands ways to calm the bladder down    Baseline decreased by 60%    Time 4    Period Months    Status On-going      PT LONG TERM GOAL #5   Title not having to wear a pad due to reduction of urinary leakage with activity and able to fully contract and relax her pelvic floor    Time 4    Period Months    Status On-going                   Plan - 04/12/21 0929     Clinical Impression Statement Patient seemed very stressed today and could be affecting the pelvic floor. She is holding her tension in the pelvic floor. we discussed her taking care of herself and trying to balance work and personal time. He pelvic floor muscles were softer than in the past. Her pain is more intermittent than constant. AS she inserts the second dilator her pain is a 6/10 initially then goes to 3/10. Patient high level of pain is 4/10 compared to 7/10. Patient understands to use the dilator more often. Patient will benefit from skilled therapy to reduce pelvic pain, improve pelvic coordination, improve tissue mobility, and quality of life.    Personal Factors and Comorbidities Fitness;Past/Current Experience;Time since onset of  injury/illness/exacerbation    Examination-Activity Limitations Continence;Toileting;Bed Mobility;Squat;Lift;Bend    Examination-Participation Restrictions Interpersonal Relationship;Community Activity    Stability/Clinical Decision Making Stable/Uncomplicated    Rehab Potential Excellent    PT Frequency 1x / week     PT Duration Other (comment)   4 months   PT Treatment/Interventions ADLs/Self Care Home Management;Biofeedback;Cryotherapy;Electrical Stimulation;Moist Heat;Ultrasound;Therapeutic activities;Therapeutic exercise;Neuromuscular re-education;Patient/family education;Manual techniques;Passive range of motion;Dry needling;Taping;Spinal Manipulations;Joint Manipulations    PT Next Visit Plan assess dry needling, work internally, around the bladder, stretch the hip adductors; see how the dilator is doing; renewal note    PT Home Exercise Plan Access Code: [email or web address]    Consulted and Agree with Plan of Care Patient             Patient will benefit from skilled therapeutic intervention in order to improve the following deficits and impairments:  Decreased coordination, Decreased range of motion, Increased fascial restricitons, Decreased endurance, Increased muscle spasms, Decreased activity tolerance, Pain, Impaired flexibility, Decreased strength, Decreased mobility  Visit Diagnosis: Cramp and spasm  Unspecified lack of coordination  Urge incontinence of urine  Vaginismus     Problem List Patient Active Problem List   Diagnosis Date Noted   Anxiety and depression 07/07/2019   Mild intermittent asthma without complication 07/07/2019   Seasonal allergies 07/07/2019   Plantar fasciitis 07/07/2019   Hyperhidrosis 07/07/2019   OAB (overactive bladder) 07/07/2019   Allergy 08/06/2018    Eulis Foster, PT 04/12/21 9:34 AM  [facility] [address] [place], [place], [address] Phone: [phone]   Fax:  [phone]  Name: Jyra Lagares MRN: [id number] Date of Birth: 07-26-1996

## 2021-04-15 ENCOUNTER — Ambulatory Visit (INDEPENDENT_AMBULATORY_CARE_PROVIDER_SITE_OTHER): Payer: 59 | Admitting: Psychiatry

## 2021-04-15 ENCOUNTER — Other Ambulatory Visit: Payer: Self-pay

## 2021-04-15 DIAGNOSIS — F411 Generalized anxiety disorder: Secondary | ICD-10-CM | POA: Diagnosis not present

## 2021-04-15 NOTE — Progress Notes (Signed)
[facility] Counselor/Therapist Progress Note  Patient ID: Brianna Barr, MRN: [id number],    Date: 04/15/2021  Time Spent: 60 minutes   Treatment Type: Individual Therapy  Reported Symptoms: anxiety,depression  Mental Status Exam:  Appearance:   Casual and Neat     Behavior:  Appropriate, Sharing, and Motivated  Motor:  Normal  Speech/Language:   Clear and Coherent  Affect:  Depressed and anxious  Mood:  anxious and depressed  Thought process:  goal directed  Thought content:    WNL  Sensory/Perceptual disturbances:    WNL  Orientation:  oriented to person, place, time/date, situation, day of week, month of year, and year  Attention:  Good  Concentration:  Good and Fair  Memory:  WNL  Fund of knowledge:   Good  Insight:    Good  Judgment:   Good  Impulse Control:  Good   Risk Assessment: Danger to Self:  No Self-injurious Behavior: No Danger to Others: No Duty to Warn:no Physical Aggression / Violence:No  Access to Firearms a concern: No  Gang Involvement:No   Subjective:  In today reporting anxiety and some increased depression but no SI.  Feeling some improvement in her grief regarding grandmother's recent death and family dysfunction that was significant during that time.  Work stressors continue and patient has continued to take more extra shifts, which probably has not been helpful to her emotionally and she agrees with that.  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  Still having some health concerns that are being addressed "but there is no easy fixes and it can get really frustrating".  Patient discussed this more in detail today and was supported in her feelings and struggles as a young adult having to deal with some many issues that  affect her daily life and outlook.  Communication issues within the family remain but not quite as bad as in recent weeks.  Continued some work today on her depression and also family dynamics and helping patient better understand the communication and relationship issues as well as set better limits as to how it impacts her.  This is more challenging for her right now due to the increase in depression.  Denies any SI.    Interventions: Solution-Oriented/Positive Psychology and Insight-Oriented  Diagnosis:   ICD-10-CM   1. Generalized anxiety disorder  F41.1       Plan:  Patient today showing motivation but it is challenged some with her current anxiety and depression. Feels she is progressing with her grief over grandmother's death. Is having some increased depression especially related to her persistent physical issues. Tending to be at home on days off and by day end "not having done anything." Encouraged to look at some activities that she can do on her own , "but since my work schedule can be erratic it makes it difficult to  do things."  Looked at some ways to improve/simplify her communication with her core group of friends. Today shares that more recently she has been more tearful and feeling more depressed, with No SI.  Her PCP has prescribed antidepressant (wellbutrin, zoloft) medication previously in 2021, and patient did not respond well.  Is willing to see a medication provider here in our office for  evaluation for current meds that could be helpful with her symptoms.  Does seem to feel calmer by end of session.  Encouraged her in her continued interruption of anxious/depressive thoughts and replaced with more encouraging and reality based thoughts.  Also encouraged patient to continue working on more positively defining herself as well as using positive self talk consistently.  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  Goal review and progress/challenges noted with patient.  Next appt within 2-3 weeks.   Mathis Fare, LCSW

## 2021-05-02 ENCOUNTER — Other Ambulatory Visit: Payer: Self-pay

## 2021-05-02 ENCOUNTER — Ambulatory Visit (INDEPENDENT_AMBULATORY_CARE_PROVIDER_SITE_OTHER): Payer: 59 | Admitting: Psychiatry

## 2021-05-02 DIAGNOSIS — F411 Generalized anxiety disorder: Secondary | ICD-10-CM | POA: Diagnosis not present

## 2021-05-02 NOTE — Progress Notes (Signed)
[facility] Counselor/Therapist Progress Note  Patient ID: Brianna Barr, MRN: [id number],    Date: 05/02/2021  Time Spent: 58 minutes   Treatment Type: Individual Therapy  Reported Symptoms: anxiety, depression  Mental Status Exam:  Appearance:   Casual and Neat     Behavior:  Appropriate, Sharing, and Motivated  Motor:  Normal  Speech/Language:   Clear and Coherent  Affect:  Anxious, depressed  Mood:  anxious and depressed  Thought process:  goal directed  Thought content:    Obsessions  Sensory/Perceptual disturbances:    WNL  Orientation:  oriented to person, place, time/date, situation, day of week, month of year, year, and stated date of May 02, 2021  Attention:  Fair  Concentration:  Fair  Memory:  WNL  Fund of knowledge:   Good  Insight:    Good  Judgment:   Good  Impulse Control:  Good   Risk Assessment: Danger to Self:  No Self-injurious Behavior: No Danger to Others: No Duty to Warn:no Physical Aggression / Violence:No  Access to Firearms a concern: No  Gang Involvement:No   Subjective:  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  Not allowing for enough rest/sleep as she picks up extra shifts at work.  Agrees to just work her regular schedule between now and next therapy appt to see how she feels any difference. Health issues persist and she is meeting her appts on those issues. Frustrated at not seeing much relief.Obsessive and intrusive thoughts. Denies any SI. Contacts with closest friends has decreased some due to their schedules and vacations. Some up and downs with grief and grandmother's death, but some better over all with that. Processed her obsessive/intrusive and anxious/depressive thoughts about herself and her situation, and she seemed to "feel less obsessed with her thoughts."  Does feel that her stress will lessen if she takes a little time off work. Feels some groundedness in her spirituality  but also challenged sometimes when "she wonders if she deserves to have all these problems." To confront more of her anxious/depressive/fearful thinking, sorting through them and working as we have done some in sessions on changing them to be more realistic and encouraging.  Feels that she is doing better with her grief regarding her grandmother's death and some of the family dysfunction that happened during that time.  Work stress continues to be an issue but she has agreed to not take on extra shifts between now and next appointment, feeling that may prove to be helpful.  Also to take some intentional time for herself and to reach out to her closer friends.    Interventions: Cognitive Behavioral Therapy and Insight-Oriented  Diagnosis:   ICD-10-CM   1. Generalized anxiety disorder  F41.1        Plan:  Patient today showing good motivation despite struggling with anxiety and depression, most of which is related to personal, health issues, and family as noted above.  No SI.  Does seem less tearful and more grounded by end of session and added that it helps to be able to vent privately about her concerns as there is not a lot of people that she talks to about sensitive issues.  Encouraged patient to continue with some behaviors that have typically helped some between sessions including: Continue interrupting anxious/depressive thoughts replacing them with more reality based thoughts that do not feed her anxiety and depression, continue work on positively identifying herself, using more consistent positive  self talk, staying in touch with her core group of friends, set more limits as to how many extra shifts she takes on at work, trying to get as much good sleep as possible, stay in the present and focus on what she can control, set and keep healthy boundaries with others, remain in contact with people who are supportive of her, and to feel good about the strength she is showing as she continues her work  with goal-directed behaviors in the midst of difficult and sometimes discouraging circumstances as she tries to move forward in a more positive direction to better emotional and physical health..  Goal review and progress/challenges noted with patient.  Next appointment within 2 to 3 weeks.   Mathis Fare, LCSW

## 2021-05-03 ENCOUNTER — Encounter: Payer: Self-pay | Admitting: Adult Health

## 2021-05-03 ENCOUNTER — Ambulatory Visit (INDEPENDENT_AMBULATORY_CARE_PROVIDER_SITE_OTHER): Payer: 59 | Admitting: Adult Health

## 2021-05-03 VITALS — BP 134/85 | HR 102 | Ht 62.0 in | Wt 227.0 lb

## 2021-05-03 DIAGNOSIS — G4700 Insomnia, unspecified: Secondary | ICD-10-CM

## 2021-05-03 DIAGNOSIS — F411 Generalized anxiety disorder: Secondary | ICD-10-CM | POA: Diagnosis not present

## 2021-05-03 DIAGNOSIS — F331 Major depressive disorder, recurrent, moderate: Secondary | ICD-10-CM | POA: Diagnosis not present

## 2021-05-03 DIAGNOSIS — F410 Panic disorder [episodic paroxysmal anxiety] without agoraphobia: Secondary | ICD-10-CM | POA: Diagnosis not present

## 2021-05-03 MED ORDER — HYDROXYZINE HCL 25 MG PO TABS: 25.0000 mg | ORAL_TABLET | Freq: Three times a day (TID) | ORAL | 5 refills | Status: DC | PRN

## 2021-05-03 MED ORDER — BUPROPION HCL ER (XL) 150 MG PO TB24: ORAL_TABLET | ORAL | 5 refills | Status: DC

## 2021-05-03 MED ORDER — PROPRANOLOL HCL 10 MG PO TABS: 10.0000 mg | ORAL_TABLET | Freq: Two times a day (BID) | ORAL | 5 refills | Status: DC

## 2021-05-03 NOTE — Progress Notes (Signed)
[facility] MD/PA/NP Initial Note  05/03/2021 11:13 AM Brianna Barr  MRN:  [id number]  Chief Complaint:   HPI:   Describes mood today as "not so good". Pleasant. Tearful - "every day". Mood symptoms - reports depression, anxiety, and irritability. Stating "there has been a dark cloud over me the past few months that I can't shake". Reports panic attacks - 3 to 4 times a week - "ramped up over past few months".  Worries about "everything under the sun". Likes to have things in order - plans, schedules. Stating "I'm very controlling". Decreased interest and motivation. Has taken psychotropic medications previously with success and feels they may be helpful. Seeing therapist bi-weekly. Energy levels low. Active, does not have a regular exercise routine.  Enjoys some usual interests and activities. Single. Lives with parents and 2 younger sisters. Spending time with family. Has a core group of 3 friends. Appetite adequate. Weight stable. Sleeps well most nights. Averages 3 to 6 hours. Focus and concentration difficulties over past 2 months. Completing tasks. Managing aspects of household. Works as a Engineer, civil (consulting) at [employer].  Denies SI or HI.  Denies AH or VH.  Previous medication trials: Zoloft in college, Viibryd, Wellbutrin  Visit Diagnosis:    ICD-10-CM   1. Generalized anxiety disorder  F41.1 propranolol (INDERAL) 10 MG tablet    hydrOXYzine (ATARAX/VISTARIL) 25 MG tablet    2. Major depressive disorder, recurrent episode, moderate (HCC)  F33.1 buPROPion (WELLBUTRIN XL) 150 MG 24 hr tablet    3. Panic attacks  F41.0     4. Insomnia, unspecified type  G47.00       Past Psychiatric History: Denies psychiatric hospitalization.   Past Medical History:  Past Medical History:  Diagnosis Date   Anxiety    Asthma    Depression    Overactive bladder    Stress incontinence     Past Surgical History:  Procedure Laterality Date   WISDOM TOOTH EXTRACTION      Family Psychiatric  History: Denies any family history of mental illness.   Family History:  Family History  Problem Relation Age of Onset   Multiple sclerosis Mother    Hypertension Father    Hypertension Maternal Grandmother    Diabetes Maternal Grandmother    Heart disease Maternal Grandmother    Cervical cancer Maternal Grandmother    Cirrhosis Maternal Grandfather    Stroke Paternal Grandmother    Heart disease Paternal Grandmother    Hypertension Paternal Grandmother    Diabetes Paternal Grandmother    Endometrial cancer Paternal Grandmother    Stroke Paternal Grandfather    Colon cancer Neg Hx    Colon polyps Neg Hx    Pancreatic cancer Neg Hx    Esophageal cancer Neg Hx    Liver disease Neg Hx    Stomach cancer Neg Hx     Social History:  Social History   Socioeconomic History   Marital status: Single    Spouse name: Not on file   Number of children: Not on file   Years of education: Not on file   Highest education level: Not on file  Occupational History   Not on file  Tobacco Use   Smoking status: Never   Smokeless tobacco: Never  Vaping Use   Vaping Use: Never used  Substance and Sexual Activity   Alcohol use: No   Drug use: No   Sexual activity: Not Currently    Partners: Male    Birth control/protection: I.U.D., Abstinence  Other Topics Concern   Not on file  Social History Narrative   Not on file   Social Determinants of Health   Financial Resource Strain: Not on file  Food Insecurity: Not on file  Transportation Needs: Not on file  Physical Activity: Not on file  Stress: Not on file  Social Connections: Not on file    Allergies: No Known Allergies  Metabolic Disorder Labs: Lab Results  Component Value Date   HGBA1C 5.7 04/11/2021   No results found for: PROLACTIN No results found for: CHOL, TRIG, HDL, CHOLHDL, VLDL, LDLCALC Lab Results  Component Value Date   TSH 6.67 (H) 04/11/2021   TSH 4.14 11/28/2020    Therapeutic Level Labs: No results  found for: LITHIUM No results found for: VALPROATE No components found for:  CBMZ  Current Medications: Current Outpatient Medications  Medication Sig Dispense Refill   buPROPion (WELLBUTRIN XL) 150 MG 24 hr tablet Take one tablet every morning for 7 days, then take two tablets every morning. 60 tablet 5   hydrOXYzine (ATARAX/VISTARIL) 25 MG tablet Take 1 tablet (25 mg total) by mouth 3 (three) times daily as needed. 90 tablet 5   propranolol (INDERAL) 10 MG tablet Take 1 tablet (10 mg total) by mouth 2 (two) times daily. 60 tablet 5   albuterol (VENTOLIN HFA) 108 (90 Base) MCG/ACT inhaler Inhale 2 puffs into the lungs every 6 (six) hours as needed for wheezing or shortness of breath. 6.7 g 1   linaclotide (LINZESS) 145 MCG CAPS capsule Take 1 capsule (145 mcg total) by mouth daily before breakfast. 90 capsule 3   montelukast (SINGULAIR) 10 MG tablet Take 1 tablet (10 mg total) by mouth at bedtime. 30 tablet 4   oxybutynin (DITROPAN-XL) 10 MG 24 hr tablet Take 1 tablet (10 mg total) by mouth daily. 30 tablet 5   No current facility-administered medications for this visit.    Medication Side Effects: none  Orders placed this visit:  No orders of the defined types were placed in this encounter.   Psychiatric Specialty Exam:  Review of Systems  Musculoskeletal:  Negative for gait problem.  Neurological:  Negative for tremors.  Psychiatric/Behavioral:         Please refer to HPI   Blood pressure 134/85, pulse (!) 102, height 5\' 2"  (1.575 m), weight 227 lb (103 kg).Body mass index is 41.52 kg/m.  General Appearance: Casual and Neat  Eye Contact:  Good  Speech:  Clear and Coherent and Normal Rate  Volume:  Normal  Mood:  Anxious and Depressed  Affect:  Appropriate and Congruent  Thought Process:  Coherent and Descriptions of Associations: Intact  Orientation:  Full (Time, Place, and Person)  Thought Content: Logical   Suicidal Thoughts:  No  Homicidal Thoughts:  No  Memory:  WNL   Judgement:  Good  Insight:  Good  Psychomotor Activity:  Normal  Concentration:  Concentration: Good  Recall:  Good  Fund of Knowledge: Good  Language: Good  Assets:  Communication Skills Desire for Improvement Financial Resources/Insurance Housing Intimacy Leisure Time Physical Health Resilience Social Support Talents/Skills Transportation Vocational/Educational  ADL's:  Intact  Cognition: WNL  Prognosis:  Good   Screenings:  GAD-7    Flowsheet Row Office Visit from 06/25/2020 in [facility] [place] Office Visit from 11/03/2019 in [facility] from 09/29/2019 in [facility] [place] from 08/18/2019 in [facility] [place] from 07/07/2019 in [facility]  Total GAD-7 Score 18 7 17 19 14       PHQ2-9    Flowsheet Row Office Visit from 06/25/2020 in [facility] [place] Office Visit from 11/03/2019 in [facility] [place] Office Visit from 09/29/2019 in [facility] [place] Office Visit from 08/18/2019 in [facility] [place] from 07/07/2019 in [facility] [place]  PHQ-2 Total Score 4 3 4 3 2   PHQ-9 Total Score 16 9 12 11 11       Flowsheet Row ED from 03/14/2021 in [facility] RISK CATEGORY No Risk       Receiving Psychotherapy: Yes - 03/16/2021  Treatment Plan/Recommendations:   Plan:  PDMP reviewed  Add Wellbutrin XL 150mg  every morning x 7 days, then increase to 300mg  every morning. Add Propranolol 10mg  BID Add Hydroxyzine 25mg  - 1 to 2 at bedtime.  RTC 4 weeks   Time spent with patient was 60 minutes. Greater than 50% of face to face time with patient was spent on counseling and coordination of care.   Patient advised to contact office with any questions, adverse effects, or acute worsening in signs and symptoms.      [facility],  NP

## 2021-05-13 ENCOUNTER — Other Ambulatory Visit: Payer: Self-pay

## 2021-05-13 ENCOUNTER — Encounter: Payer: Self-pay | Admitting: Physical Therapy

## 2021-05-13 ENCOUNTER — Ambulatory Visit: Payer: 59 | Attending: Obstetrics and Gynecology | Admitting: Physical Therapy

## 2021-05-13 DIAGNOSIS — R279 Unspecified lack of coordination: Secondary | ICD-10-CM | POA: Insufficient documentation

## 2021-05-13 DIAGNOSIS — N3941 Urge incontinence: Secondary | ICD-10-CM | POA: Diagnosis present

## 2021-05-13 DIAGNOSIS — N942 Vaginismus: Secondary | ICD-10-CM | POA: Diagnosis present

## 2021-05-13 DIAGNOSIS — R252 Cramp and spasm: Secondary | ICD-10-CM | POA: Diagnosis present

## 2021-05-13 NOTE — Therapy (Signed)
[facility] [address], [place], [place], [address] Phone: [phone]   Fax:  [phone]  Physical Therapy Treatment  Patient Details  Name: Brianna Barr MRN: [id number] Date of Birth: 1996-05-21 Referring Provider (PT): Dr. Sherlene Shams   Encounter Date: 05/13/2021   PT End of Session - 05/13/21 1620     Visit Number 9    Date for PT Re-Evaluation 05/13/21    Authorization Type [employer]    Authorization - Visit Number 8    Authorization - Number of Visits 60    PT Start Time 1400    PT Stop Time 3244    PT Time Calculation (min) 45 min    Activity Tolerance Patient tolerated treatment well;No increased pain    Behavior During Therapy WFL for tasks assessed/performed             Past Medical History:  Diagnosis Date   Anxiety    Asthma    Depression    Overactive bladder    Stress incontinence     Past Surgical History:  Procedure Laterality Date   WISDOM TOOTH EXTRACTION      There were no vitals filed for this visit.   Subjective Assessment - 05/13/21 1401     Subjective Everything feels the same. Patient is on the second or third dilator depending on the day. Not always abel to be on the third dilator due to pain.    Patient Stated Goals decreased pain; decreased incontinence    Currently in Pain? Yes    Pain Score 3    the tihrd dilator is a 4/10   Pain Location Vagina    Pain Orientation Mid    Pain Descriptors / Indicators Sharp;Aching;Sore    Pain Type Chronic pain    Pain Onset More than a month ago    Pain Frequency Intermittent    Aggravating Factors  vaginal exam, using a dilator or speculum    Pain Relieving Factors rest    Multiple Pain Sites No                [facility] PT Assessment - 05/13/21 0001       Assessment   Medical Diagnosis N32.81 overactive bladder; N94.10 Dyspareunia, female; W10.272 Levator spasm    Referring Provider (PT) Dr. Sherlene Shams    Prior Therapy yes      Precautions   Precautions None      Restrictions   Weight Bearing Restrictions No      Balance Screen   Has the patient fallen in the past 6 months No    Has the patient had a decrease in activity level because of a fear of falling?  No    Is the patient reluctant to leave their home because of a fear of falling?  No      Home Ecologist residence      Prior Function   Level of Independence Independent    Vocation Full time employment    Vocation Requirements nurse    Leisure walks      Posture/Postural Control   Posture/Postural Control No significant limitations      ROM / Strength   AROM / PROM / Strength Strength;PROM;AROM      PROM   Right Hip Flexion 120    Left Hip Flexion 120    Left Hip External Rotation  70      Palpation   SI assessment  ASIS are equal                        Pelvic Floor Special Questions - 05/13/21 0001     Pelvic Floor Internal Exam Patient consents and approves PT to assess pelvic floor and treatment    Exam Type Vaginal               [facility] Adult PT Treatment/Exercise - 05/13/21 0001       Self-Care   Self-Care Other Self-Care Comments    Other Self-Care Comments  reviewed how to use the dilator and go slowly, then use for 30 minutes to let the tissue relax and elongate      Lumbar Exercises: Stretches   Active Hamstring Stretch Right;Left;1 rep;30 seconds    Active Hamstring Stretch Limitations supine with a strap    ITB Stretch Right;Left;1 rep;60 seconds    ITB Stretch Limitations with a strap in supine      Manual Therapy   Manual Therapy Internal Pelvic Floor;Soft tissue mobilization    Soft tissue mobilization bilateral hip adductors to prepare for internal work    Internal Pelvic Floor using the [place] reveleum while performingmanual work to the introitus to expand the tissue and along the levator ani going slowly and monitoring  for pain, started with index finger then ended with index and middle finger in the vaginal canal for first time                    PT Education - 05/13/21 1441     Education Details reviewd how to use the dilator and progress, use for 30 minutes every other day    Person(s) Educated Patient    Methods Explanation    Comprehension Verbalized understanding              PT Short Term Goals - 03/07/21 0932       PT SHORT TERM GOAL #1   Title independent with initial HEP    Time 4    Period Weeks    Status Achieved      PT SHORT TERM GOAL #2   Title able to tolerate external palpation to the vulva area with pain level </= 3/10    Time 4    Period Weeks    Status Achieved      PT SHORT TERM GOAL #3   Title using pelvic floor meditation to relax the pelvic floor and decrease her anxiety    Time 4    Period Weeks    Status Achieved      PT SHORT TERM GOAL #4   Title understand vaginal health and how to restore ph balance of the vagina    Time 4    Period Weeks    Status Achieved               PT Long Term Goals - 05/13/21 1628       PT LONG TERM GOAL #1   Title independent with advanced HEP    Time 4    Period Months    Status Achieved      PT LONG TERM GOAL #2   Title able to tolerate a vaginal exam due to the ability to use the largest dilator and elongate the vaginal canal with diaphragmatic breathing    Baseline She can tolerat 2 fingers into the vaginal canal    Time 4    Period Months  Status Partially Met      PT LONG TERM GOAL #3   Title pelvic pain decreased >/= 80% due to the ability to elongate the vaginal canal with largest dilator    Time 4    Period Months    Status Not Met      PT LONG TERM GOAL #4   Title urinary leakage decreased >/= 80% due to decreased over active bladder activity and understands ways to calm the bladder down    Baseline decreased by 60%    Time 4    Period Months    Status Partially Met      PT  LONG TERM GOAL #5   Title not having to wear a pad due to reduction of urinary leakage with activity and able to fully contract and relax her pelvic floor    Time 4    Period Months    Status Not Met                   Plan - 05/13/21 1620     Clinical Impression Statement Patient is able to use the second and sometimes the third dilator. Patient continues to have difficulty with finding time for her to relax due to an intense work schedule. During therapy the patient was able to tolerate the therapist  to use index finger and middle finger in the canal for the first time. Patient understands how to use her dilators and progress herself. She was instructed to use the dilators for 30 minutes to give her tissue time to relax and expand. Patient has increased motion in her hips. Patient would like to take a brake from therapy at this time and focus on the dilator. She wants to resume when she starts back with the trigger point injections and the therapist agrees on this plan. She still has pain and urinary leakage due to the pelvic floor tightness. Her tissue relax slowly so it is good to take time at home to work on the tissue.    Personal Factors and Comorbidities Fitness;Past/Current Experience;Time since onset of injury/illness/exacerbation    Examination-Activity Limitations Continence;Toileting;Bed Mobility;Squat;Lift;Bend    Examination-Participation Restrictions Interpersonal Relationship;Community Activity    Stability/Clinical Decision Making Stable/Uncomplicated    Rehab Potential Excellent    PT Treatment/Interventions ADLs/Self Care Home Management;Biofeedback;Cryotherapy;Electrical Stimulation;Moist Heat;Ultrasound;Therapeutic activities;Therapeutic exercise;Neuromuscular re-education;Patient/family education;Manual techniques;Passive range of motion;Dry needling;Taping;Spinal Manipulations;Joint Manipulations    PT Next Visit Plan discharge to HEP with the dilators. She is to get  a new script for physical therapy when she starts back with trigger point injections into the pelvic floor muscles    PT Home Exercise Plan Access Code: [email or web address]    Consulted and Agree with Plan of Care Patient             Patient will benefit from skilled therapeutic intervention in order to improve the following deficits and impairments:  Decreased coordination, Decreased range of motion, Increased fascial restricitons, Decreased endurance, Increased muscle spasms, Decreased activity tolerance, Pain, Impaired flexibility, Decreased strength, Decreased mobility  Visit Diagnosis: Cramp and spasm  Unspecified lack of coordination  Urge incontinence of urine  Vaginismus     Problem List Patient Active Problem List   Diagnosis Date Noted   Anxiety and depression 07/07/2019   Mild intermittent asthma without complication 57/32/2025   Seasonal allergies 07/07/2019   Plantar fasciitis 07/07/2019   Hyperhidrosis 07/07/2019   OAB (overactive bladder) 07/07/2019   Allergy 08/06/2018    Earlie Counts, PT 05/13/21 4:30  PM  [facility] [address] [place], [place], [address] Phone: [phone]   Fax:  [phone]  Name: Takako Minckler MRN: [id number] Date of Birth: 1995/12/09  PHYSICAL THERAPY DISCHARGE SUMMARY  Visits from Start of Care: 9  Current functional level related to goals / functional outcomes: See above.    Remaining deficits: See above.    Education / Equipment: HEP and using her vaginal dilators   Patient agrees to discharge. Patient goals were partially met. Patient is being discharged due to  patient taking time to work on using her dilators and home and will resume therapy when she starts pelvic floor trigger point injections.Thank you for the referral. Earlie Counts, PT 05/13/21 4:31 PM

## 2021-05-15 ENCOUNTER — Other Ambulatory Visit: Payer: Self-pay

## 2021-05-15 ENCOUNTER — Ambulatory Visit (INDEPENDENT_AMBULATORY_CARE_PROVIDER_SITE_OTHER): Payer: 59 | Admitting: Psychiatry

## 2021-05-15 DIAGNOSIS — F411 Generalized anxiety disorder: Secondary | ICD-10-CM

## 2021-05-15 NOTE — Progress Notes (Signed)
[facility] Counselor/Therapist Progress Note  Patient ID: Brianna Barr, MRN: [id number],    Date: 05/15/2021  Time Spent: 58 minutes   Treatment Type: Individual Therapy  Reported Symptoms:anxiety, depression  Mental Status Exam:  Appearance:   Casual and Neat     Behavior:  Appropriate, Sharing, and Motivated  Motor:  Normal  Speech/Language:   Clear and Coherent and Normal Rate  Affect:  Anxious, depressed  Mood:  anxious and depressed  Thought process:  goal directed  Thought content:    Some obsessiveness  Sensory/Perceptual disturbances:    WNL  Orientation:  oriented to person, place, time/date, situation, day of week, month of year, year, and stated date of May 15, 2021  Attention:  Fair  Concentration:  Fair  Memory:  WNL  Fund of knowledge:   Good  Insight:    Good  Judgment:   Good  Impulse Control:  Good   Risk Assessment: Danger to Self:  No Self-injurious Behavior: No Danger to Others: No Duty to Warn:no Physical Aggression / Violence:No  Access to Firearms a concern: No  Gang Involvement:No   Subjective:   Patient in today reporting anxiety, depression, some lowered motivation.  Some tearfulness.  Denies any SI. Saw med provider recently and re-started Wellbutrin "and is following directions as prescribed, hasn't noticed feeling any different." Admits little things upset her and especially when "things are out of my control." Stressed with work and processed that at length along with some of her disappointment. Did follow through on her commitment not to pick up extra work shifts since last appt. 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 Control issues come up frequently. Worked today with some of her control issues in hopes that she can let go "or try letting go of having to have control".,  Also discussing how this works against her and trying to have a fuller life.,  including going to a different church from what her parents choose.  That is a pretty significant issue for patient and she feels that she has to go to their church since she is living with them and they do not support her going to a different church.  Discussed some ways in session today that she might try to work with this issue.  Also worked on her self talk which tends to be more negative than positive and she is to continue working on this between sessions. She is to work on confronting more of her anxious/depressive thoughts as well as her "need to control things" thoughts, and worked on changing both of them to be more healthy and empowering versus restrictive or depressive.  Interventions: Cognitive Behavioral Therapy and Insight-Oriented  Diagnosis:   ICD-10-CM   1. Generalized anxiety disorder  F41.1        Plan of Care:   Patient not signing tx plan on computer screen due to COVID. Treatment Goals: Goals will remain on tx plan as patient works with strategies to meet her goals.  Progress will be noted each session and documented in "Progress" section of Plan. Long term goal: Reduce overall level, frequency, and intensity of the anxiety so that daily functioning is not impaired. Short term goal: Increase understanding of beliefs and messages that produce anxiety, depression, or "worries". Strategy: Identify, challenge, and replace anxious/depressive/fearful self-talk with positive, realistic, and empowering self-talk.     Plan:  Patient today showing less motivation than in previous  appointment but it tends to be more mood dependent.  As the session went on however I did notice some improvement in her motivation.  Encouraged patient to continue with some behaviors that have typically helped her between sessions including: Using more consistent positive self talk, staying in touch with her core group of friends and being open to meeting new friends, continue interrupting  anxious/depressive thoughts and replace them with more reality based thoughts that do not feed her anxiety nor depression, continue work on positively identifying herself, setting more limits as to how many extra shifts she takes on at work, try to allow ample time for good sleep, stay in the present and focus on what she can control, set and keep healthy boundaries with others, remain in contact with people who are supportive, and recognize the strengths she is showing as she works with goal-directed behaviors in the midst of difficult and discouraging circumstances as she tries to move forward in a more positive direction of improved emotional and physical health.   Goal review and progress/challenges noted with patient.  Next appointment within 2 to 3 weeks.   Mathis Fare, LCSW

## 2021-05-17 ENCOUNTER — Telehealth: Payer: Self-pay

## 2021-05-17 NOTE — Telephone Encounter (Signed)
PA was approved for [facility]

## 2021-05-17 NOTE — Telephone Encounter (Signed)
PA sent for [facility] waiting on a response Key [id number]

## 2021-05-28 ENCOUNTER — Other Ambulatory Visit: Payer: Self-pay | Admitting: Adult Health

## 2021-05-28 DIAGNOSIS — F331 Major depressive disorder, recurrent, moderate: Secondary | ICD-10-CM

## 2021-05-28 DIAGNOSIS — F411 Generalized anxiety disorder: Secondary | ICD-10-CM

## 2021-05-30 ENCOUNTER — Other Ambulatory Visit: Payer: Self-pay

## 2021-05-30 ENCOUNTER — Ambulatory Visit (INDEPENDENT_AMBULATORY_CARE_PROVIDER_SITE_OTHER): Payer: 59 | Admitting: Psychiatry

## 2021-05-30 DIAGNOSIS — F331 Major depressive disorder, recurrent, moderate: Secondary | ICD-10-CM

## 2021-05-30 NOTE — Progress Notes (Signed)
[facility] Counselor/Therapist Progress Note  Patient ID: Brianna Barr, MRN: [id number],    Date: 05/30/2021  Time Spent: 57 minutes   Treatment Type: Individual Therapy  Reported Symptoms: anxiety, depression, states depression is stronger symptom more recently  Mental Status Exam:  Appearance:   Neat     Behavior:  Appropriate, Sharing, and Motivated  Motor:  Normal  Speech/Language:   Clear and Coherent  Affect:  Anxious, depressed  Mood:  anxious and depressed  Thought process:  normal  Thought content:    WNL  Sensory/Perceptual disturbances:    WNL  Orientation:  oriented to person, place, time/date, situation, day of week, month of year, year, and stated date of Aug. 4, 2022  Attention:  Good  Concentration:  Good and Fair  Memory:  WNL  Fund of knowledge:   Good  Insight:    Good and Fair  Judgment:   Good  Impulse Control:  Good and Fair   Risk Assessment: Danger to Self:  No Self-injurious Behavior: No Danger to Others: No Duty to Warn:no Physical Aggression / Violence:No  Access to Firearms a concern: No  Gang Involvement:No   Subjective:  Patient in today reporting anxiety, depression, but "am noticing some improvement in my anxiety and depression." But still significantly depressed and not finding much joy especially when she has time off and could be enjoying the time for herself. Tearfully processed some depressing and painful thoughts/feelings about her depression. Has recently followed through a bit more on suggested positive talk and actions but finds it hard to follow through for very long.  Has stopped taking extra work hours and that has helped some. Assuming the negatives is still a critical issue and we worked on this more today. Agreed today to stop assuming the worst, and stopping negative self-talk, and to not be cancelling times planned with her friends Working to see the damage that negative thinking and assumptions do to her and  trying to interrupt them and not keep repeating then throughout a day, but rather replace those thoughts with more reality-based and encouraging thoughts that do not feed her depression/anxiety/ negativity.  Agrees today to not take on any extra job shifts at work as she does feel this has helped her some more recently.  Denies any SI.   Interventions: Cognitive Behavioral Therapy and Insight-Oriented  Diagnosis:   ICD-10-CM   1. Major depressive disorder, recurrent episode, moderate (HCC)  F33.1       Treatment goal plan:   Patient not signing tx plan on computer screen due to COVID. Treatment Goals: Goals will remain on tx plan as patient works with strategies to meet her goals.  Progress will be noted each session and documented in "Progress" section of Plan. Long term goal: Reduce overall level, frequency, and intensity of the anxiety so that daily functioning is not impaired. Short term goal: Increase understanding of beliefs and messages that produce anxiety, depression, or "worries". Strategy: Identify, challenge, and replace anxious/depressive/fearful self-talk with positive, realistic, and empowering self-talk.     Plan:  Patient today showing that his motivation initially but as the session went on her motivation and engagement in session improved significantly.  Struggling with more depression recently and also some anxiety.  Reports she has been remaining on her medication but admits that she struggles in following through with strategies discussed in sessions.  She does feel it would be helpful if she could have less depressive thoughts and agrees to  work on this more between sessions, as noted above..  She denies any SI.  Encouraged patient to practice some behaviors that have typically proven to be helpful to her: Staying in touch with her core group of friends and being open to meeting new friends, continue interrupting anxious/negative/depressive thoughts and replacing them  with more reality based thoughts that do not feed her anxiety/depression/negativity, use more consistent positive self talk, continue work on positively identifying herself, setting more limits as to how many extra shifts she takes on at work, allow ample time for good sleep, stay in the present and focus on what she can control, set and keep healthy boundaries with others, stay in contact with people who are supportive, to new to not take on extra jobs shifts at work so that she does get some time off, and feel good about the strength she is showing as she works with goal-directed behaviors in the midst of difficult and discouraging circumstances as she tries to move forward in a more positive direction of improved emotional and physical health.  Goal review and progress/challenges noted with patient.  Next appointment within 2 to 3 weeks.   Mathis Fare, LCSW

## 2021-05-31 ENCOUNTER — Encounter: Payer: 59 | Admitting: Physical Therapy

## 2021-05-31 ENCOUNTER — Ambulatory Visit: Payer: 59 | Admitting: Adult Health

## 2021-06-04 ENCOUNTER — Other Ambulatory Visit: Payer: Self-pay

## 2021-06-04 ENCOUNTER — Ambulatory Visit (INDEPENDENT_AMBULATORY_CARE_PROVIDER_SITE_OTHER): Payer: 59 | Admitting: Adult Health

## 2021-06-04 ENCOUNTER — Encounter: Payer: Self-pay | Admitting: Adult Health

## 2021-06-04 DIAGNOSIS — F331 Major depressive disorder, recurrent, moderate: Secondary | ICD-10-CM

## 2021-06-04 DIAGNOSIS — F410 Panic disorder [episodic paroxysmal anxiety] without agoraphobia: Secondary | ICD-10-CM | POA: Diagnosis not present

## 2021-06-04 DIAGNOSIS — G4700 Insomnia, unspecified: Secondary | ICD-10-CM | POA: Diagnosis not present

## 2021-06-04 DIAGNOSIS — F411 Generalized anxiety disorder: Secondary | ICD-10-CM | POA: Diagnosis not present

## 2021-06-04 MED ORDER — BUPROPION HCL ER (XL) 150 MG PO TB24
ORAL_TABLET | ORAL | 5 refills | Status: DC
Start: 2021-06-04 — End: 2021-07-16

## 2021-06-04 NOTE — Progress Notes (Signed)
Brianna Barr [id number] [birth year] 25 y.o.  Subjective:   Patient ID:  Brianna Barr is a 25 y.o. (DOB 1996-07-09) female.  Chief Complaint: No chief complaint on file.   HPI Brianna Barr presents to the office today for follow-up of MDD, GAD, panic attacks, and insomnia.  Describes mood today as "a little better". Pleasant. Tearful - "every day". Mood symptoms - continues to report depression, anxiety, and irritability. Decreased worry - "a slight improvement". Decreased panic attacks - "that is better". Stating "I can tell a slight difference in my mood". Feels like medications have helped, but would like to increase dose of Wellbutrin. Improved interest and motivation. Taking medications as prescribed. Seeing therapist bi-weekly. Energy levels low. Active, does not have a regular exercise routine.  Enjoys some usual interests and activities. Single. Lives with parents and 2 younger sisters. Spending time with family. Has a core group of 3 friends. Appetite adequate. Weight stable. Sleeps ing difficulties. Averages 3 to 6 hours. Able to get to sleep easier, but not staying asleep. Focus and concentration difficulties - "improved". Completing tasks. Managing aspects of household. Works as a Engineer, civil (consulting) at [employer].  Denies SI or HI.  Denies AH or VH.  Previous medication trials: Zoloft in college, Viibryd, Wellbutrin   GAD-7    Flowsheet Row Office Visit from 06/25/2020 in [facility] from 11/03/2019 in [facility] from 09/29/2019 in [facility] from 08/18/2019 in [facility] from 07/07/2019 in [facility]  Total GAD-7 Score 18 7 17 19 14       PHQ2-9    Flowsheet Row Office Visit from 06/25/2020 in [facility] from 11/03/2019 in [facility] from  09/29/2019 in [facility] from 08/18/2019 in [facility] from 07/07/2019 in [facility] Total Score 4 3 4 3 2   PHQ-9 Total Score 16 9 12 11 11       Flowsheet Row ED from 03/14/2021 in [facility] RISK CATEGORY No Risk        Review of Systems:  Review of Systems  Musculoskeletal:  Negative for gait problem.  Neurological:  Negative for tremors.  Psychiatric/Behavioral:         Please refer to HPI   Medications: I have reviewed the patient's current medications.  Current Outpatient Medications  Medication Sig Dispense Refill   albuterol (VENTOLIN HFA) 108 (90 Base) MCG/ACT inhaler Inhale 2 puffs into the lungs every 6 (six) hours as needed for wheezing or shortness of breath. 6.7 g 1   buPROPion (WELLBUTRIN XL) 150 MG 24 hr tablet Take three tablets every morning. 90 tablet 5   hydrOXYzine (ATARAX/VISTARIL) 25 MG tablet Take 1 tablet (25 mg total) by mouth 3 (three) times daily as needed. 90 tablet 5   linaclotide (LINZESS) 145 MCG CAPS capsule Take 1 capsule (145 mcg total) by mouth daily before breakfast. 90 capsule 3   montelukast (SINGULAIR) 10 MG tablet Take 1 tablet (10 mg total) by mouth at bedtime. 30 tablet 4   oxybutynin (DITROPAN-XL) 10 MG 24 hr tablet Take 1 tablet (10 mg total) by mouth daily. 30 tablet 5   propranolol (INDERAL) 10 MG tablet Take 1 tablet (10 mg total) by mouth 2 (two) times daily. 60 tablet 5   No  current facility-administered medications for this visit.    Medication Side Effects: None  Allergies: No Known Allergies  Past Medical History:  Diagnosis Date   Anxiety    Asthma    Depression    Overactive bladder    Stress incontinence     Past Medical History, Surgical history, Social history, and Family history were reviewed and updated as appropriate.   Please see review of systems for further details on the  patient's review from today.   Objective:   Physical Exam:  There were no vitals taken for this visit.  Physical Exam Constitutional:      General: She is not in acute distress. Musculoskeletal:        General: No deformity.  Neurological:     Mental Status: She is alert and oriented to person, place, and time.     Coordination: Coordination normal.  Psychiatric:        Attention and Perception: Attention and perception normal. She does not perceive auditory or visual hallucinations.        Mood and Affect: Mood normal. Mood is not anxious or depressed. Affect is not labile, blunt, angry or inappropriate.        Speech: Speech normal.        Behavior: Behavior normal.        Thought Content: Thought content normal. Thought content is not paranoid or delusional. Thought content does not include homicidal or suicidal ideation. Thought content does not include homicidal or suicidal plan.        Cognition and Memory: Cognition and memory normal.        Judgment: Judgment normal.     Comments: Insight intact    Lab Review:     Component Value Date/Time   NA 139 04/11/2021 0745   K 4.5 04/11/2021 0745   CL 104 04/11/2021 0745   CO2 28 04/11/2021 0745   GLUCOSE 87 04/11/2021 0745   BUN 11 04/11/2021 0745   CREATININE 0.89 04/11/2021 0745   CALCIUM 9.1 04/11/2021 0745   PROT 6.6 04/11/2021 0745   ALBUMIN 3.9 04/11/2021 0745   AST 9 04/11/2021 0745   ALT 10 04/11/2021 0745   ALKPHOS 76 04/11/2021 0745   BILITOT 0.3 04/11/2021 0745       Component Value Date/Time   WBC 12.5 (H) 04/11/2021 0745   RBC 4.79 04/11/2021 0745   HGB 12.8 04/11/2021 0745   HCT 39.1 04/11/2021 0745   PLT 411.0 (H) 04/11/2021 0745   MCV 81.7 04/11/2021 0745   MCH 23.4 (L) 10/03/2020 1146   MCHC 32.7 04/11/2021 0745   RDW 15.4 04/11/2021 0745   LYMPHSABS 3.3 04/11/2021 0745   MONOABS 0.9 04/11/2021 0745   EOSABS 0.1 04/11/2021 0745   BASOSABS 0.1 04/11/2021 0745    No results found for:  POCLITH, LITHIUM   No results found for: PHENYTOIN, PHENOBARB, VALPROATE, CBMZ   .res Assessment: Plan:     Plan:  PDMP reviewed  Increase Wellbutrin XL 300mg  to 450mg  every morning. Denies seizure history. Propranolol 10mg  BID Hydroxyzine 25mg  - 1 to 2 at bedtime.  RTC 4 weeks  Time spent with patient was 25 minutes. Greater than 50% of face to face time with patient was spent on counseling and coordination of care.   Diagnoses and all orders for this visit:  Insomnia, unspecified type  Panic attacks  Generalized anxiety disorder  Major depressive disorder, recurrent episode, moderate (HCC) -     buPROPion (WELLBUTRIN XL) 150 MG 24  hr tablet; Take three tablets every morning.    Please see After Visit Summary for patient specific instructions.  Future Appointments  Date Time Provider Department Center  06/11/2021  9:00 AM Mathis Fare, LCSW [facility] None  06/26/2021  9:00 AM Mathis Fare, LCSW [facility] None  07/02/2021 10:40 AM Pieter Fooks, Thereasa Solo, NP [facility] None  07/10/2021  9:00 AM Mathis Fare, LCSW [facility] None  07/25/2021  9:00 AM Mathis Fare, LCSW [facility] None  08/07/2021  9:00 AM Mathis Fare, LCSW [facility] None  08/21/2021  9:00 AM Mathis Fare, LCSW [facility] None    No orders of the defined types were placed in this encounter.   -------------------------------

## 2021-06-06 ENCOUNTER — Other Ambulatory Visit: Payer: Self-pay

## 2021-06-06 ENCOUNTER — Ambulatory Visit (HOSPITAL_BASED_OUTPATIENT_CLINIC_OR_DEPARTMENT_OTHER)
Admission: RE | Admit: 2021-06-06 | Discharge: 2021-06-06 | Disposition: A | Discharge status: Home / Self Care | Payer: 59 | Source: Ambulatory Visit | Attending: Emergency Medicine | Admitting: Emergency Medicine

## 2021-06-06 ENCOUNTER — Encounter: Payer: Self-pay | Admitting: Emergency Medicine

## 2021-06-06 ENCOUNTER — Ambulatory Visit
Admission: EM | Admit: 2021-06-06 | Discharge: 2021-06-06 | Disposition: A | Discharge status: Home / Self Care | Payer: 59 | Attending: Emergency Medicine | Admitting: Emergency Medicine

## 2021-06-06 DIAGNOSIS — S99921A Unspecified injury of right foot, initial encounter: Secondary | ICD-10-CM | POA: Diagnosis not present

## 2021-06-06 DIAGNOSIS — M79674 Pain in right toe(s): Secondary | ICD-10-CM | POA: Insufficient documentation

## 2021-06-06 MED ORDER — IBUPROFEN 800 MG PO TABS: 800.0000 mg | ORAL_TABLET | Freq: Three times a day (TID) | ORAL | 0 refills | Status: DC

## 2021-06-06 MED ORDER — MUPIROCIN 2 % EX OINT: 1.0000 "application " | TOPICAL_OINTMENT | Freq: Two times a day (BID) | CUTANEOUS | 0 refills | Status: DC

## 2021-06-06 NOTE — ED Triage Notes (Signed)
Pt is present today with right big toe nail injury. Pt states that she hit her toe against a brick stair case this morning. Pt states that it hurst to bend her toe.

## 2021-06-06 NOTE — ED Provider Notes (Signed)
[facility]    CSN: [id number] Arrival date & time: 06/06/21  1253      History   Chief Complaint Chief Complaint  Patient presents with   Toe Injury    HPI Brianna Barr is a 25 y.o. female history of asthma, presenting today for evaluation of toe injury.  Hit her right toe on a brick step earlier today causing her toenail to crack.  As the days progressed she has developed increased pain and discomfort to her toe and is concerned about possible fracture.  HPI  Past Medical History:  Diagnosis Date   Anxiety    Asthma    Depression    Overactive bladder    Stress incontinence     Patient Active Problem List   Diagnosis Date Noted   Anxiety and depression 07/07/2019   Mild intermittent asthma without complication 07/07/2019   Seasonal allergies 07/07/2019   Plantar fasciitis 07/07/2019   Hyperhidrosis 07/07/2019   OAB (overactive bladder) 07/07/2019   Allergy 08/06/2018    Past Surgical History:  Procedure Laterality Date   WISDOM TOOTH EXTRACTION      OB History     Gravida  0   Para  0   Term  0   Preterm  0   AB  0   Living  0      SAB  0   IAB  0   Ectopic  0   Multiple  0   Live Births  0            Home Medications    Prior to Admission medications   Medication Sig Start Date End Date Taking? Authorizing Provider  ibuprofen (ADVIL) 800 MG tablet Take 1 tablet (800 mg total) by mouth 3 (three) times daily. 06/06/21  Yes Lameshia Hypolite C, PA-C  mupirocin ointment (BACTROBAN) 2 % Apply 1 application topically 2 (two) times daily. 06/06/21  Yes Indiya Izquierdo C, PA-C  albuterol (VENTOLIN HFA) 108 (90 Base) MCG/ACT inhaler Inhale 2 puffs into the lungs every 6 (six) hours as needed for wheezing or shortness of breath. 11/09/19   Deeann Saint, MD  buPROPion (WELLBUTRIN XL) 150 MG 24 hr tablet Take three tablets every morning. 06/04/21   Mozingo, Thereasa Solo, NP  hydrOXYzine (ATARAX/VISTARIL) 25 MG tablet Take 1  tablet (25 mg total) by mouth 3 (three) times daily as needed. 05/03/21   Mozingo, Thereasa Solo, NP  linaclotide [facility]) 145 MCG CAPS capsule Take 1 capsule (145 mcg total) by mouth daily before breakfast. 04/04/21   Hilarie Fredrickson, MD  montelukast (SINGULAIR) 10 MG tablet Take 1 tablet (10 mg total) by mouth at bedtime. 03/13/21   Deeann Saint, MD  oxybutynin (DITROPAN-XL) 10 MG 24 hr tablet Take 1 tablet (10 mg total) by mouth daily. 12/28/20   Marguerita Beards, MD  propranolol (INDERAL) 10 MG tablet Take 1 tablet (10 mg total) by mouth 2 (two) times daily. 05/03/21   Mozingo, Thereasa Solo, NP    Family History Family History  Problem Relation Age of Onset   Multiple sclerosis Mother    Hypertension Father    Hypertension Maternal Grandmother    Diabetes Maternal Grandmother    Heart disease Maternal Grandmother    Cervical cancer Maternal Grandmother    Cirrhosis Maternal Grandfather    Stroke Paternal Grandmother    Heart disease Paternal Grandmother    Hypertension Paternal Grandmother    Diabetes Paternal Grandmother    Endometrial cancer  Paternal Grandmother    Stroke Paternal Grandfather    Colon cancer Neg Hx    Colon polyps Neg Hx    Pancreatic cancer Neg Hx    Esophageal cancer Neg Hx    Liver disease Neg Hx    Stomach cancer Neg Hx     Social History Social History   Tobacco Use   Smoking status: Never   Smokeless tobacco: Never  Vaping Use   Vaping Use: Never used  Substance Use Topics   Alcohol use: No   Drug use: No     Allergies   Patient has no known allergies.   Review of Systems Review of Systems  Constitutional:  Negative for fatigue and fever.  HENT:  Negative for mouth sores.   Eyes:  Negative for visual disturbance.  Respiratory:  Negative for shortness of breath.   Cardiovascular:  Negative for chest pain.  Gastrointestinal:  Negative for abdominal pain, nausea and vomiting.  Genitourinary:  Negative for genital sores.   Musculoskeletal:  Positive for arthralgias. Negative for joint swelling.  Skin:  Negative for color change, rash and wound.  Neurological:  Negative for dizziness, weakness, light-headedness and headaches.    Physical Exam Triage Vital Signs ED Triage Vitals  Enc Vitals Group     BP 06/06/21 1313 138/87     Pulse Rate 06/06/21 1313 95     Resp 06/06/21 1313 18     Temp 06/06/21 1313 99.1 F (37.3 C)     Temp Source 06/06/21 1313 Oral     SpO2 06/06/21 1313 98 %     Weight --      Height --      Head Circumference --      Peak Flow --      Pain Score 06/06/21 1312 7     Pain Loc --      Pain Edu? --      Excl. in GC? --    No data found.  Updated Vital Signs BP 138/87   Pulse 95   Temp 99.1 F (37.3 C) (Oral)   Resp 18   SpO2 98%   Visual Acuity Right Eye Distance:   Left Eye Distance:   Bilateral Distance:    Right Eye Near:   Left Eye Near:    Bilateral Near:     Physical Exam Vitals and nursing note reviewed.  Constitutional:      Appearance: She is well-developed.     Comments: No acute distress  HENT:     Head: Normocephalic and atraumatic.     Nose: Nose normal.  Eyes:     Conjunctiva/sclera: Conjunctivae normal.  Cardiovascular:     Rate and Rhythm: Normal rate.  Pulmonary:     Effort: Pulmonary effort is normal. No respiratory distress.  Abdominal:     General: There is no distension.  Musculoskeletal:        General: Normal range of motion.     Cervical back: Neck supple.     Comments: Right great toe with overlying nail cracked throughout distal half, does not extend into cuticle, underlying blood present, unable to visualize extent of bleeding due to nail polish present, tenderness diffusely throughout great toe up until MCP joint Dorsalis pedis 2+  Skin:    General: Skin is warm and dry.  Neurological:     Mental Status: She is alert and oriented to person, place, and time.     UC Treatments / Results  Labs (all labs ordered  are  listed, but only abnormal results are displayed) Labs Reviewed - No data to display  EKG   Radiology No results found.  Procedures Procedures (including critical care time)  Medications Ordered in UC Medications - No data to display  Initial Impression / Assessment and Plan / UC Course  I have reviewed the triage vital signs and the nursing notes.  Pertinent labs & imaging results that were available during my care of the patient were reviewed by me and considered in my medical decision making (see chart for details).     Right toe injury-nail injury, discussed nail care, nail polish present, unclear extent of possible underlying subungual hematoma, discussed monitoring with patient.  Anti-inflammatories as needed, x-ray order placed.  Bactroban topically to help prevent secondary infection.  Discussed strict return precautions. Patient verbalized understanding and is agreeable with plan.  Final Clinical Impressions(s) / UC Diagnoses   Final diagnoses:  Injury of right great toe, initial encounter     Discharge Instructions      Please go for xray Use anti-inflammatories for pain/swelling. You may take up to 800 mg Ibuprofen every 8 hours with food. You may supplement Ibuprofen with Tylenol [phone] mg every 8 hours.  Keep nail clean and trimmed May soak in warm soapy water twice daily Dry well, apply antibiotic ointment twice daily to help prevent infection Please follow-up for any concerns regarding healing     ED Prescriptions     Medication Sig Dispense Auth. Provider   ibuprofen (ADVIL) 800 MG tablet Take 1 tablet (800 mg total) by mouth 3 (three) times daily. 21 tablet Amilcar Reever C, PA-C   mupirocin ointment (BACTROBAN) 2 % Apply 1 application topically 2 (two) times daily. 30 g Eily Louvier, [place] C, PA-C      PDMP not reviewed this encounter.   Lew Dawes, PA-C 06/06/21 1435

## 2021-06-06 NOTE — Discharge Instructions (Addendum)
Please go for xray Use anti-inflammatories for pain/swelling. You may take up to 800 mg Ibuprofen every 8 hours with food. You may supplement Ibuprofen with Tylenol [phone] mg every 8 hours.  Keep nail clean and trimmed May soak in warm soapy water twice daily Dry well, apply antibiotic ointment twice daily to help prevent infection Please follow-up for any concerns regarding healing

## 2021-06-08 ENCOUNTER — Other Ambulatory Visit: Payer: Self-pay | Admitting: Family Medicine

## 2021-06-08 DIAGNOSIS — J302 Other seasonal allergic rhinitis: Secondary | ICD-10-CM

## 2021-06-11 ENCOUNTER — Other Ambulatory Visit: Payer: Self-pay

## 2021-06-11 ENCOUNTER — Ambulatory Visit (INDEPENDENT_AMBULATORY_CARE_PROVIDER_SITE_OTHER): Payer: 59 | Admitting: Psychiatry

## 2021-06-11 DIAGNOSIS — F411 Generalized anxiety disorder: Secondary | ICD-10-CM

## 2021-06-11 NOTE — Progress Notes (Signed)
[facility] Counselor/Therapist Progress Note  Patient ID: Brianna Barr, MRN: [id number],    Date: 06/11/2021  Time Spent: 58 minutes  Treatment Type: Individual Therapy  Reported Symptoms: anxiety, depression "has improved some"  Mental Status Exam:  Appearance:   Casual     Behavior:  Appropriate, Sharing, and motivated "fair"  Motor:  Normal  Speech/Language:   Clear and Coherent  Affect:  Anxiety, some depression  Mood:  anxious and depressed  Thought process:  goal directed  Thought content:    Some obsessiveness   Sensory/Perceptual disturbances:    WNL  Orientation:  oriented to person, place, time/date, situation, day of week, month of year, year, and stated date of Aug. 16, 2022  Attention:  Good  Concentration:  Good and Fair  Memory:  WNL  Fund of knowledge:   Good  Insight:    Good  Judgment:   Good  Impulse Control:  Good   Risk Assessment: Danger to Self:  No Self-injurious Behavior: No Danger to Others: No Duty to Warn:no Physical Aggression / Violence:No  Access to Firearms a concern: No  Gang Involvement:No   Subjective:   Patient in today reporting anxiety, depression with some improvement in depression. Anxiety and depression mostly related to work stressors, continued health issues, family, and personal concerns.  Denies any SI.  Continues to work on improved self-care, especially in not taking on extra shifts at work, when she's already stressed. Younger sister is getting married and this brings up some difficult issues and deep feelings for patient "not feeling good enough or wanted in a relationship."  Some tearfulness as these feeling has some longer history for patient based on her sharing today. Processed her sad feelings in session today which helped her unload more of her past pain that seems to hold her back, especially some of the inner-family "pain and hurt". Reports she's continues to try and have better self-talk and self-care,  set better limits and boundaries within family and beyond, which is helping patient. Discussed her follow-through on goal-directed behaviors and working harder to interrupt negative assumptions.  Discussed more today also about the negative damage from negative thoughts and assumptions which patient easily related to.  Interventions: Cognitive Behavioral Therapy, Ego-Supportive, and Insight-Oriented  Diagnosis:   ICD-10-CM   1. Generalized anxiety disorder  F41.1        Treatment goal plan:   Patient not signing tx plan on computer screen due to COVID. Treatment Goals: Goals will remain on tx plan as patient works with strategies to meet her goals.  Progress will be noted each session and documented in "Progress" section of Plan. Long term goal: Reduce overall level, frequency, and intensity of the anxiety so that daily functioning is not impaired. Short term goal: Increase understanding of beliefs and messages that produce anxiety, depression, or "worries". Strategy: Identify, challenge, and replace anxious/depressive/fearful self-talk with positive, realistic, and empowering self-talk.     Plan:  Patient today showing motivation which increased over the course of the session today as patient was very verbally and emotionally involved in discussing treatment goal directed behaviors, especially in managing some self esteem issues but also managing hurts within family situations.  Today, anxiety was a stronger symptom, but closely linked with her depressive feelings.  Encouraged patient to continue with some behaviors that have proven to be helpful to her previously including: Interrupting her anxious/negative/depressive thoughts and replacing them with more reality based and encouraging thoughts that do not  feed her negative feelings, staying in touch with her core group of friends, being open to meeting new friends when she has the opportunities, use more consistent positive self talk,  setting limits as to how many extra shifts she takes on at work which for right now she is trying not to take on any, identify herself in positive ways, allow for good sleep patterns, staying in the present focusing on what she can control, stay in contact with supportive people, set and keep healthy boundaries with others, not over personalizing other family members comments, and to recognize the strengths she is showing in working with goal-directed behaviors during difficult and discouraging circumstances as she tries to move forward in a more positive direction of improved emotional and physical health.  Goal review and progress/challenges noted with patient.  Appointment within 2 weeks.   Mathis Fare, LCSW

## 2021-06-12 ENCOUNTER — Encounter: Payer: 59 | Admitting: Physical Therapy

## 2021-06-26 ENCOUNTER — Other Ambulatory Visit: Payer: Self-pay

## 2021-06-26 ENCOUNTER — Ambulatory Visit (INDEPENDENT_AMBULATORY_CARE_PROVIDER_SITE_OTHER): Payer: 59 | Admitting: Psychiatry

## 2021-06-26 DIAGNOSIS — F411 Generalized anxiety disorder: Secondary | ICD-10-CM | POA: Diagnosis not present

## 2021-06-26 NOTE — Progress Notes (Signed)
[facility] Counselor/Therapist Progress Note  Patient ID: Brianna Barr, MRN: [id number],    Date: 06/26/2021  Time Spent: 55 minutes   Treatment Type: Individual Therapy  Reported Symptoms: anxiety, depression  Mental Status Exam:  Appearance:   Neat     Behavior:  Appropriate, Sharing, and Motivated  Motor:  Normal  Speech/Language:   Clear and Coherent  Affect:  Anxious, depressed  Mood:  anxious and depressed  Thought process:  goal directed  Thought content:    WNL  Sensory/Perceptual disturbances:    WNL  Orientation:  oriented to person, place, time/date, situation, day of week, month of year, year, and stated date of Aug. 31, 2022  Attention:  Fair  Concentration:  Good and Fair  Memory:  WNL  Fund of knowledge:   Good  Insight:    Good  Judgment:   Good  Impulse Control:  Good   Risk Assessment: Danger to Self:  No Self-injurious Behavior: No Danger to Others: No Duty to Warn:no Physical Aggression / Violence:No  Access to Firearms a concern: No  Gang Involvement:No   Subjective: Patient in today reporting anxiety and depression with no significant changes, is staying on meds, antidepressant has recently been increased. Stressors related to work, health issues, personal, and family. Feels her motivation level has been down and some exhaustion from work (healthcare). "Haven't made a conscious, consistent effort with strategies we've talked about in session", and "having a lot of negative self-talk which doesn't help, but the frequency has not been quite as bad." States "it's not often and it's not every day" and she was able to see that as a positive.  Admits with her work in a very stressful environment and dealing with her health issues, it has been hard to get motivated.  Easier when she is here in office and talking/processing with therapist. Wants to be more motivated, enthusiastic about implementing things we talk about in sessions, stop beating  myself up, not holding onto  guilt, be more proactive. "I feel like I'm not worthy of being healthy and having a positive outlook, and have healing, because I don't think I'm capable and it's hard for me to do something different and maintain it." "Am good at talking a good talk but I don't persevere." "I'm 25 and have had this negative self-defeating mindset for a long time and it's hard to change and sometimes feel "it's a lost cause."  Worked with patient on processing these feeling that block her from making changes and feeling better.  Specifically focusing on her working more diligently on her self talk, interrupting negative assumptions, and not feeling that she is worthy.  Interventions: Cognitive Behavioral Therapy, Ego-Supportive, and Insight-Oriented  Diagnosis:   ICD-10-CM   1. Generalized anxiety disorder  F41.1       Treatment goal plan:   Patient not signing tx plan on computer screen due to COVID. Treatment Goals: Goals will remain on tx plan as patient works with strategies to meet her goals.  Progress will be noted each session and documented in "Progress" section of Plan. Long term goal: Reduce overall level, frequency, and intensity of the anxiety so that daily functioning is not impaired. Short term goal: Increase understanding of beliefs and messages that produce anxiety, depression, or "worries". Strategy: Identify, challenge, and replace anxious/depressive/fearful self-talk with positive, realistic, and empowering self-talk.    Plan: Patient today showing decreased motivation initially compared to last session however as today's session continued she  definitely improved in her motivation and engagement in session.  Focusing more on what seems to be blocking her from trying some of the strategies and thought patterns we discussed and she was able to explain the lengthy history of some of her anxiety, depression, and negative thoughts and her difficulty in getting and  staying motivated.  Discussed some of the blocks for patient as well as ways of managing them better and feeling less controlled by them.  Encouraged patient to practice positive behaviors that have been helpful previously including: Staying in touch with her core group of friends, interrupting anxious/negative/depressive thoughts and challenging them and replacing with more realistic and empowering thoughts that do not feed anxiety nor depression, being open to meeting new friends when she has the opportunity, using more consistent positive self talk, setting limits as to how many extra shifts she takes on at work which for right now she is trying to not take on any extra, identifying herself in positive ways, staying in the present focusing on what she can control, allowing for good sleep patterns, staying in contact with supportive people, set and keep healthy boundaries with others as needed, not over personalizing other family members comments, and feel good about the strength she is showing in working with goal-directed behaviors during difficult and discouraging circumstances as she seeks to move forward in a more positive direction of improved emotional and physical health.  Goal review and progress/challenges noted with patient.  Next appointment within 2 weeks.   Mathis Fare, LCSW

## 2021-06-28 ENCOUNTER — Other Ambulatory Visit: Payer: Self-pay | Admitting: Adult Health

## 2021-06-28 DIAGNOSIS — F411 Generalized anxiety disorder: Secondary | ICD-10-CM

## 2021-06-28 NOTE — Telephone Encounter (Signed)
90 day ok?

## 2021-07-02 ENCOUNTER — Other Ambulatory Visit: Payer: Self-pay

## 2021-07-02 ENCOUNTER — Encounter: Payer: Self-pay | Admitting: Adult Health

## 2021-07-02 ENCOUNTER — Ambulatory Visit (INDEPENDENT_AMBULATORY_CARE_PROVIDER_SITE_OTHER): Payer: 59 | Admitting: Adult Health

## 2021-07-02 DIAGNOSIS — F331 Major depressive disorder, recurrent, moderate: Secondary | ICD-10-CM | POA: Diagnosis not present

## 2021-07-02 DIAGNOSIS — F411 Generalized anxiety disorder: Secondary | ICD-10-CM | POA: Diagnosis not present

## 2021-07-02 DIAGNOSIS — F410 Panic disorder [episodic paroxysmal anxiety] without agoraphobia: Secondary | ICD-10-CM

## 2021-07-02 DIAGNOSIS — G4700 Insomnia, unspecified: Secondary | ICD-10-CM | POA: Diagnosis not present

## 2021-07-02 MED ORDER — ESCITALOPRAM OXALATE 10 MG PO TABS: 10.0000 mg | ORAL_TABLET | Freq: Every day | ORAL | 2 refills | Status: DC

## 2021-07-02 NOTE — Progress Notes (Signed)
Brianna Barr [id number] August 11, 1996 25 y.o.  Subjective:   Patient ID:  Brianna Barr is a 25 y.o. (DOB 1995-12-15) female.  Chief Complaint: No chief complaint on file.   HPI Brianna Barr presents to the office today for follow-up of MDD, GAD, panic attacks, and insomnia.  Describes mood today as "better". Pleasant. Tearful - "4 to 5 out of 7 days". Mood symptoms - reports decreased depression, anxiety, and irritability. Decreased worry. Decreased panic attacks - "a few scattered in there". Stating "I am feeling better". Feels like she is making "progress". Has tolerated increase in Wellbutrin - "still not where I want to be". Improved interest and motivation. Taking medications as prescribed. Seeing therapist bi-weekly - Brianna Barr. Energy levels lower. Active, does not have a regular exercise routine.  Enjoys some usual interests and activities. Single. Lives with parents and 2 younger sisters. Spending time with family. Has a core group of 3 friends. Appetite adequate. Weight stable. Sleeps better some nights than others, but always a struggle. Averages 4 to 7 hours. Focus and concentration "slightly improved". Completing tasks. Managing aspects of household. Works as a Engineer, civil (consulting) at [employer].  Denies SI or HI.  Denies AH or VH.  Previous medication trials: Zoloft in college, Viibryd, Wellbutrin   GAD-7    Flowsheet Row Office Visit from 06/25/2020 in [facility] from 11/03/2019 in [facility] from 09/29/2019 in [facility] from 08/18/2019 in [facility] from 07/07/2019 in [facility] [place]  Total GAD-7 Score 18 7 17 19 14       PHQ2-9    Flowsheet Row Office Visit from 06/25/2020 in [facility] [place] Office Visit from 11/03/2019 in [facility] [place] from 09/29/2019 in [facility] [place] from 08/18/2019 in [facility] [place] from 07/07/2019 in [facility] [place] Total Score 4 3 4 3 2   PHQ-9 Total Score 16 9 12 11 11       Flowsheet Row ED from 06/06/2021 in [facility] from 03/14/2021 in [facility] RISK CATEGORY No Risk No Risk        Review of Systems:  Review of Systems  Musculoskeletal:  Negative for gait problem.  Neurological:  Negative for tremors.  Psychiatric/Behavioral:         Please refer to HPI   Medications: I have reviewed the patient's current medications.  Current Outpatient Medications  Medication Sig Dispense Refill   escitalopram (LEXAPRO) 10 MG tablet Take 1 tablet (10 mg total) by mouth daily. 30 tablet 2   albuterol (VENTOLIN HFA) 108 (90 Base) MCG/ACT inhaler Inhale 2 puffs into the lungs every 6 (six) hours as needed for wheezing or shortness of breath. 6.7 g 1   buPROPion (WELLBUTRIN XL) 150 MG 24 hr tablet Take three tablets every morning. 90 tablet 5   hydrOXYzine (ATARAX/VISTARIL) 25 MG tablet TAKE 1 TABLET BY MOUTH THREE TIMES A DAY AS NEEDED 270 tablet 0   linaclotide (LINZESS) 145 MCG CAPS capsule Take 1 capsule (145 mcg total) by mouth daily before breakfast. 90 capsule 3   montelukast (SINGULAIR) 10 MG tablet TAKE 1 TABLET BY MOUTH EVERYDAY AT BEDTIME 90 tablet 0   mupirocin ointment (BACTROBAN) 2 % Apply 1 application topically 2 (two) times daily. 30 g 0  oxybutynin (DITROPAN-XL) 10 MG 24 hr tablet Take 1 tablet (10 mg total) by mouth daily. 30 tablet 5   propranolol (INDERAL) 10 MG tablet Take 1 tablet (10 mg total) by mouth 2 (two) times daily. 60 tablet 5   No current facility-administered medications for this visit.    Medication Side Effects: None  Allergies: No Known Allergies  Past Medical History:  Diagnosis Date   Anxiety    Asthma    Depression    Overactive  bladder    Stress incontinence     Past Medical History, Surgical history, Social history, and Family history were reviewed and updated as appropriate.   Please see review of systems for further details on the patient's review from today.   Objective:   Physical Exam:  There were no vitals taken for this visit.  Physical Exam Constitutional:      General: She is not in acute distress. Musculoskeletal:        General: No deformity.  Neurological:     Mental Status: She is alert and oriented to person, place, and time.     Coordination: Coordination normal.  Psychiatric:        Attention and Perception: Attention and perception normal. She does not perceive auditory or visual hallucinations.        Mood and Affect: Mood normal. Mood is not anxious or depressed. Affect is not labile, blunt, angry or inappropriate.        Speech: Speech normal.        Behavior: Behavior normal.        Thought Content: Thought content normal. Thought content is not paranoid or delusional. Thought content does not include homicidal or suicidal ideation. Thought content does not include homicidal or suicidal plan.        Cognition and Memory: Cognition and memory normal.        Judgment: Judgment normal.     Comments: Insight intact    Lab Review:     Component Value Date/Time   NA 139 04/11/2021 0745   K 4.5 04/11/2021 0745   CL 104 04/11/2021 0745   CO2 28 04/11/2021 0745   GLUCOSE 87 04/11/2021 0745   BUN 11 04/11/2021 0745   CREATININE 0.89 04/11/2021 0745   CALCIUM 9.1 04/11/2021 0745   PROT 6.6 04/11/2021 0745   ALBUMIN 3.9 04/11/2021 0745   AST 9 04/11/2021 0745   ALT 10 04/11/2021 0745   ALKPHOS 76 04/11/2021 0745   BILITOT 0.3 04/11/2021 0745       Component Value Date/Time   WBC 12.5 (H) 04/11/2021 0745   RBC 4.79 04/11/2021 0745   HGB 12.8 04/11/2021 0745   HCT 39.1 04/11/2021 0745   PLT 411.0 (H) 04/11/2021 0745   MCV 81.7 04/11/2021 0745   MCH 23.4 (L) 10/03/2020 1146    MCHC 32.7 04/11/2021 0745   RDW 15.4 04/11/2021 0745   LYMPHSABS 3.3 04/11/2021 0745   MONOABS 0.9 04/11/2021 0745   EOSABS 0.1 04/11/2021 0745   BASOSABS 0.1 04/11/2021 0745    No results found for: POCLITH, LITHIUM   No results found for: PHENYTOIN, PHENOBARB, VALPROATE, CBMZ   .res Assessment: Plan:    Plan:  PDMP reviewed  Wellbutrin XL 450mg  every morning. Denies seizure history. Propranolol 10mg  BID Hydroxyzine 25mg  - 1 to 2 at bedtime. Add Lexapro 10mg  daily  RTC 4 weeks  Time spent with patient was 25 minutes. Greater than 50% of face to face time with patient was spent on  counseling and coordination of care.   Diagnoses and all orders for this visit:  Generalized anxiety disorder -     escitalopram (LEXAPRO) 10 MG tablet; Take 1 tablet (10 mg total) by mouth daily.  Insomnia, unspecified type  Major depressive disorder, recurrent episode, moderate (HCC)  Panic attacks    Please see After Visit Summary for patient specific instructions.  Future Appointments  Date Time Provider Department Center  07/04/2021 10:15 AM Lenn Sink, DPM [facility]  07/10/2021  9:00 AM Mathis Fare, LCSW [facility] None  07/25/2021  9:00 AM Mathis Fare, LCSW [facility] None  08/07/2021  9:00 AM Mathis Fare, LCSW [facility] None  08/21/2021  9:00 AM Mathis Fare, LCSW [facility] None    No orders of the defined types were placed in this encounter.   -------------------------------

## 2021-07-04 ENCOUNTER — Other Ambulatory Visit: Payer: Self-pay

## 2021-07-04 ENCOUNTER — Ambulatory Visit (INDEPENDENT_AMBULATORY_CARE_PROVIDER_SITE_OTHER): Payer: Managed Care, Other (non HMO) | Admitting: Podiatry

## 2021-07-04 ENCOUNTER — Encounter: Payer: Self-pay | Admitting: Podiatry

## 2021-07-04 ENCOUNTER — Ambulatory Visit (INDEPENDENT_AMBULATORY_CARE_PROVIDER_SITE_OTHER): Payer: 59

## 2021-07-04 DIAGNOSIS — M722 Plantar fascial fibromatosis: Secondary | ICD-10-CM

## 2021-07-04 MED ORDER — TRIAMCINOLONE ACETONIDE 10 MG/ML IJ SUSP
20.0000 mg | Freq: Once | INTRAMUSCULAR | Status: AC
Start: 2021-07-04 — End: 2021-07-04
  Administered 2021-07-04: 20 mg

## 2021-07-04 NOTE — Progress Notes (Signed)
Subjective:   Patient ID: Brianna Barr, female   DOB: 25 y.o.   MRN: [id number]   HPI Patient presents stating she has had a lot of pain in the bottom of both her heels and its only been over the last couple months and she was pretty good for upwards of a year   ROS      Objective:  Physical Exam  Neurovascular status intact exquisite discomfort medial fascial band bilateral also a cracked big toenail she was concerned about right     Assessment:  Acute plantar fasciitis bilateral with inflammation with a damaged right hallux nail 1 month ago     Plan:  H&P reviewed condition sterile prep injected the plantar fascial bilateral 3 mg Kenalog 5 mg Xylocaine and allow nail to grow out normally.  If symptoms recur I will see her back and I do not mind if this turns out to be once a year once every 18 months  X-rays indicate that there is minimal spurring moderate depression of the arch bilateral no indication stress fracture arthritis associated with condition

## 2021-07-10 ENCOUNTER — Other Ambulatory Visit: Payer: Self-pay

## 2021-07-10 ENCOUNTER — Ambulatory Visit (INDEPENDENT_AMBULATORY_CARE_PROVIDER_SITE_OTHER): Payer: 59 | Admitting: Psychiatry

## 2021-07-10 DIAGNOSIS — F411 Generalized anxiety disorder: Secondary | ICD-10-CM

## 2021-07-10 NOTE — Progress Notes (Signed)
[facility] Counselor/Therapist Progress Note  Patient ID: Brianna Barr, MRN: [id number],    Date: 07/10/2021  Time Spent: 58 minutes   Treatment Type: Individual Therapy  Reported Symptoms: anxiety, depression, states anxiety is the stronger symptom  Mental Status Exam:  Appearance:   Casual     Behavior:  Appropriate, Sharing, and Motivated  Motor:  Normal  Speech/Language:   Clear and Coherent  Affect:  Anxious, depressed  Mood:  anxious and depressed  Thought process:  goal directed  Thought content:    Some obsessive thoughts "sometimes more at home than at work"  Sensory/Perceptual disturbances:    WNL  Orientation:  oriented to person, place, time/date, situation, day of week, month of year, year, and stated date of Sept 14, 2022  Attention:  Good  Concentration:  Good and Fair  Memory:  WNL  Fund of knowledge:   Good  Insight:    Good  Judgment:   Good  Impulse Control:  Good and Fair   Risk Assessment: Danger to Self:  No Self-injurious Behavior: No Danger to Others: No Duty to Warn:no Physical Aggression / Violence:No  Access to Firearms a concern: No  Gang Involvement:No   Subjective:  Patient in today reporting depression and anxiety, with anxiety being her stronger symptom. Stressors include health issues, work and family concerns. Staying on meds as prescribed. Noticing improvement "in ease of getting to sleep a bit easier which makes the bedtime obsessive/anxious thoughts lessen, and the hours I sleep have increased to 5-6 hours and hopefully will increase more." Has continued to not take on the extra work shifts on her job and feels that is helping also. Working more on her self-talk being more positive and she notices more progress there. She shared specific examples of improved self talk in session today which reflects more effort and perseverance on her part. Reports  her motivation level has increased some and partly because of seeing progress  she is making. Worked further today on her feelings re: guilt and not feeling "worthy." To do homework assignment on these 2 issues and we will pick up on this next session.  Interventions: Cognitive Behavioral Therapy and Insight-Oriented  Diagnosis:   ICD-10-CM   1. Generalized anxiety disorder  F41.1      Treatment goal plan:   Patient not signing tx plan on computer screen due to COVID. Treatment Goals: Goals will remain on tx plan as patient works with strategies to meet her goals.  Progress will be noted each session and documented in "Progress" section of Plan. Long term goal: Reduce overall level, frequency, and intensity of the anxiety so that daily functioning is not impaired. Short term goal: Increase understanding of beliefs and messages that produce anxiety, depression, or "worries". Strategy: Identify, challenge, and replace anxious/depressive/fearful self-talk with positive, realistic, and empowering self-talk.    Plan: Patient today showing improved motivation since last session and was very actively involved in session.  She reports noticing some gradual improvement which is encouraging for her.  Worked further today on some of her self negating talk and behaviors, as well as more generalized anxiety symptoms.  Is noticing also some improved sleep which she finds helpful and is hoping that will continue.  Is under a lot of stress at home helping care for father who had recent knee surgery as well as some health concerns with mom, and patient's own health concerns.  Is considering getting a second opinion for her ongoing and  worsening foot issue.  Encouraged patient to practice positive behaviors that can be helpful to her including: Interrupting anxious/negative/depressive thoughts and challenging them and replacing with more realistic and empowering thoughts that do not lead to anxiety nor depression, staying in touch with her core group of friends, being open to meeting new  friends when she has the opportunity, practice more consistent positive self talk, continue to refrain from taking on extra work shifts at this point, identifying herself in positive ways, staying in the present focusing on what she can control, saying no when she needs to say no, allowing for good sleep patterns, staying in contact with supportive people, set and keep healthy boundaries with others, refrain from over personalizing other family members comments, take breaks as she can throughout the day, and feel good about the strength she is showing as she works with goal-directed behaviors to move forward in a more positive direction of improved emotional and physical health, and especially in her own self esteem and wellbeing.  Goal review and progress/challenges noted with patient.  Next appointment within 2 weeks.   Mathis Fare, LCSW

## 2021-07-12 ENCOUNTER — Other Ambulatory Visit: Payer: Self-pay | Admitting: Adult Health

## 2021-07-12 DIAGNOSIS — F331 Major depressive disorder, recurrent, moderate: Secondary | ICD-10-CM

## 2021-07-24 ENCOUNTER — Ambulatory Visit: Payer: 59 | Admitting: Psychiatry

## 2021-07-25 ENCOUNTER — Other Ambulatory Visit: Payer: Self-pay

## 2021-07-25 ENCOUNTER — Ambulatory Visit (INDEPENDENT_AMBULATORY_CARE_PROVIDER_SITE_OTHER): Payer: 59 | Admitting: Psychiatry

## 2021-07-25 DIAGNOSIS — F411 Generalized anxiety disorder: Secondary | ICD-10-CM | POA: Diagnosis not present

## 2021-07-25 NOTE — Progress Notes (Signed)
[facility] Counselor/Therapist Progress Note  Patient ID: Brianna Barr, MRN: [id number],    Date: 07/25/2021  Time Spent: 58 minutes   Treatment Type: Individual Therapy  Reported Symptoms: anxiety, depression  Mental Status Exam:  Appearance:   Casual     Behavior:  Appropriate, Sharing, and Motivated  Motor:  Normal  Speech/Language:   Clear and Coherent  Affect:  Depressed and anxious  Mood:  anxious and depressed  Thought process:  goal directed  Thought content:    Some overthinking  Sensory/Perceptual disturbances:    WNL  Orientation:  oriented to person, place, time/date, situation, day of week, month of year, year, and stated date of Sept. 29, 2022  Attention:  Good  Concentration:  Good and Fair  Memory:  WNL  Fund of knowledge:   Good  Insight:    Good  Judgment:   Good  Impulse Control:  Good   Risk Assessment: Danger to Self:  No Self-injurious Behavior: No Danger to Others: No Duty to Warn:no Physical Aggression / Violence:No  Access to Firearms a concern: No  Gang Involvement:No   Subjective:  Patient in today reporting anxiety, depression related personal, health concerns, relationships, finances, and work. Still getting to sleep some easier which seems to help decrease obsessive/anxious thoughts at bedtime. "Outside stuff going on that keeps me from sleeping well." Trying to let go more of "those type of things in order to get better sleep." Continues not taking extra shifts at work which she feels has been helpful. States she is making progress and understands progress is not always predictable.  Absence of a relationship is a concern for patient which she processed more today, feeling "something is wrong with her and that nobody really wants her." Wanting to find some connections to other young adults; wanting to explore another church but parents with whom she has lived were wanting her to remain in their "home church" where there are few  young adults. Looking for place of her own (house/apt?) and has now found a place so feels good about this new venture, although added more "temporary stress". Looked at ways to better mange her anxiety with more positive self-talk (and shared more examples today),  deep breathing exercises, staying in the present and "not overthinking/worrying", taking 1 day at the time and pacing herself. Feelings of "unworthiness" discussed last session, some better.  Interventions: Solution-Oriented/Positive Psychology, Ego-Supportive, and Insight-Oriented  Diagnosis:   ICD-10-CM   1. Generalized anxiety disorder  F41.1      Treatment goal plan:   Patient not signing tx plan on computer screen due to COVID. Treatment Goals: Goals will remain on tx plan as patient works with strategies to meet her goals.  Progress will be noted each session and documented in "Progress" section of Plan. Long term goal: Reduce overall level, frequency, and intensity of the anxiety so that daily functioning is not impaired. Short term goal: Increase understanding of beliefs and messages that produce anxiety, depression, or "worries". Strategy: Identify, challenge, and replace anxious/depressive/fearful self-talk with positive, realistic, and empowering self-talk.    Plan: Patient today showing good motivation and engagement in session.  As noted above good follow-up on some goal directed behaviors and noticing some additional gradual improvement which I think is also feeding patient's motivation.  She is to continue the steps she is currently taking in relation to her goals and also continue having healthier boundaries with her time and not taking on extra shifts at  work.  Encouraged patient in practicing positive behaviors that have been helpful to her previously including: Staying in touch with her core group of friends, being open to meeting new friends when she has the opportunity to practice more consistent positive self  talk, continue to refrain from taking on extra work shifts at this point, interrupting anxious/negative/depressive thoughts and challenging them and replacing with more realistic and empowering thoughts that do not feed her anxiety nor depression, identifying herself in positive ways, staying in the present focusing on what she can control, saying no when she needs to say no, allow for good sleep patterns, staying in contact with supportive people, set and keep healthy boundaries with others, refrain from over personalizing other family members comments, take breaks as she can throughout the day, and recognize the strengths she is showing as she works with goal-directed behaviors to move in a direction that supports improved self esteem, emotional health, and overall wellbeing.   Goal review and progress/challenges noted with patient.  Next appointment within 2 to 3 weeks.  This record has been created using [employer].  Chart creation errors have been sought, but may not always have been located and corrected.  Such creation errors do not reflect on the standard of medical care provided.    Mathis Fare, LCSW

## 2021-07-26 ENCOUNTER — Other Ambulatory Visit: Payer: Self-pay | Admitting: Adult Health

## 2021-07-26 DIAGNOSIS — F411 Generalized anxiety disorder: Secondary | ICD-10-CM

## 2021-07-26 NOTE — Telephone Encounter (Signed)
90 day ok?

## 2021-07-30 ENCOUNTER — Encounter: Payer: Self-pay | Admitting: Adult Health

## 2021-07-30 ENCOUNTER — Other Ambulatory Visit: Payer: Self-pay

## 2021-07-30 ENCOUNTER — Ambulatory Visit (INDEPENDENT_AMBULATORY_CARE_PROVIDER_SITE_OTHER): Payer: 59 | Admitting: Adult Health

## 2021-07-30 DIAGNOSIS — G4700 Insomnia, unspecified: Secondary | ICD-10-CM | POA: Diagnosis not present

## 2021-07-30 DIAGNOSIS — F411 Generalized anxiety disorder: Secondary | ICD-10-CM | POA: Diagnosis not present

## 2021-07-30 DIAGNOSIS — F410 Panic disorder [episodic paroxysmal anxiety] without agoraphobia: Secondary | ICD-10-CM

## 2021-07-30 DIAGNOSIS — F331 Major depressive disorder, recurrent, moderate: Secondary | ICD-10-CM

## 2021-07-30 NOTE — Progress Notes (Signed)
Brianna Barr [id number] 07-07-1996 25 y.o.  Subjective:   Patient ID:  Brianna Barr is a 25 y.o. (DOB 1996-10-18) female.  Chief Complaint: No chief complaint on file.   HPI Brianna Barr presents to the office today for follow-up of MDD, GAD, panic attacks, and insomnia.  Describes mood today as "better". Pleasant. Tearful - "1 to 2 days out of 7 days". Mood symptoms - reports decreased depression - "significantly improved", anxiety - "improved", and irritability - "better". Decreased worry. Decreased panic attacks - "a couple". Stating "I do feeling better". Improved interest and motivation. Taking medications as prescribed. Seeing therapist bi-weekly - Rockne Menghini. Energy levels Improved - has bursts of energy where she can get things done". Active, does not have a regular exercise routine.  Enjoys some usual interests and activities. Single. Lives with parents and 2 younger sisters. Spending time with family. Has a core group of 3 friends. Appetite adequate. Weight stable. Sleeps better some nights than others. Feels it is easier to get to sleep. Averages 4 to 7 hours. Focus and concentration "slightly improved". Completing tasks. Managing aspects of household. Works as a Engineer, civil (consulting) at [employer].  Denies SI or HI.  Denies AH or VH.  Previous medication trials: Zoloft in college, Viibryd, Wellbutrin    GAD-7    Flowsheet Row Office Visit from 06/25/2020 in [facility] from 11/03/2019 in [facility] from 09/29/2019 in [facility] from 08/18/2019 in [facility] from 07/07/2019 in [facility] [place]  Total GAD-7 Score 18 7 17 19 14       PHQ2-9    Flowsheet Row Office Visit from 06/25/2020 in [facility] [place] Office Visit from 11/03/2019 in [facility] [place] from 09/29/2019 in [facility] [place] from 08/18/2019 in [facility] [place] from 07/07/2019 in [facility] [place] Total Score 4 3 4 3 2   PHQ-9 Total Score 16 9 12 11 11       Flowsheet Row ED from 06/06/2021 in [facility] from 03/14/2021 in [facility] RISK CATEGORY No Risk No Risk        Review of Systems:  Review of Systems  Musculoskeletal:  Negative for gait problem.  Neurological:  Negative for tremors.  Psychiatric/Behavioral:         Please refer to HPI   Medications: I have reviewed the patient's current medications.  Current Outpatient Medications  Medication Sig Dispense Refill   albuterol (VENTOLIN HFA) 108 (90 Base) MCG/ACT inhaler Inhale 2 puffs into the lungs every 6 (six) hours as needed for wheezing or shortness of breath. 6.7 g 1   buPROPion (WELLBUTRIN XL) 150 MG 24 hr tablet TAKE THREE TABLETS EVERY MORNING. 270 tablet 0   escitalopram (LEXAPRO) 10 MG tablet TAKE 1 TABLET BY MOUTH EVERY DAY 90 tablet 0   hydrOXYzine (ATARAX/VISTARIL) 25 MG tablet TAKE 1 TABLET BY MOUTH THREE TIMES A DAY AS NEEDED 270 tablet 0   linaclotide (LINZESS) 145 MCG CAPS capsule Take 1 capsule (145 mcg total) by mouth daily before breakfast. 90 capsule 3   montelukast (SINGULAIR) 10 MG tablet TAKE 1 TABLET BY MOUTH EVERYDAY AT BEDTIME 90 tablet 0   mupirocin ointment (BACTROBAN) 2 % Apply 1 application topically 2 (two) times daily. 30 g 0  oxybutynin (DITROPAN-XL) 10 MG 24 hr tablet Take 1 tablet (10 mg total) by mouth daily. 30 tablet 5   propranolol (INDERAL) 10 MG tablet Take 1 tablet (10 mg total) by mouth 2 (two) times daily. 60 tablet 5   No current facility-administered medications for this visit.    Medication Side Effects: None  Allergies: No Known Allergies  Past Medical History:  Diagnosis Date   Anxiety    Asthma    Depression    Overactive bladder     Stress incontinence     Past Medical History, Surgical history, Social history, and Family history were reviewed and updated as appropriate.   Please see review of systems for further details on the patient's review from today.   Objective:   Physical Exam:  There were no vitals taken for this visit.  Physical Exam Constitutional:      General: She is not in acute distress. Musculoskeletal:        General: No deformity.  Neurological:     Mental Status: She is alert and oriented to person, place, and time.     Coordination: Coordination normal.  Psychiatric:        Attention and Perception: Attention and perception normal. She does not perceive auditory or visual hallucinations.        Mood and Affect: Mood normal. Mood is not anxious or depressed. Affect is not labile, blunt, angry or inappropriate.        Speech: Speech normal.        Behavior: Behavior normal.        Thought Content: Thought content normal. Thought content is not paranoid or delusional. Thought content does not include homicidal or suicidal ideation. Thought content does not include homicidal or suicidal plan.        Cognition and Memory: Cognition and memory normal.        Judgment: Judgment normal.     Comments: Insight intact    Lab Review:     Component Value Date/Time   NA 139 04/11/2021 0745   K 4.5 04/11/2021 0745   CL 104 04/11/2021 0745   CO2 28 04/11/2021 0745   GLUCOSE 87 04/11/2021 0745   BUN 11 04/11/2021 0745   CREATININE 0.89 04/11/2021 0745   CALCIUM 9.1 04/11/2021 0745   PROT 6.6 04/11/2021 0745   ALBUMIN 3.9 04/11/2021 0745   AST 9 04/11/2021 0745   ALT 10 04/11/2021 0745   ALKPHOS 76 04/11/2021 0745   BILITOT 0.3 04/11/2021 0745       Component Value Date/Time   WBC 12.5 (H) 04/11/2021 0745   RBC 4.79 04/11/2021 0745   HGB 12.8 04/11/2021 0745   HCT 39.1 04/11/2021 0745   PLT 411.0 (H) 04/11/2021 0745   MCV 81.7 04/11/2021 0745   MCH 23.4 (L) 10/03/2020 1146   MCHC  32.7 04/11/2021 0745   RDW 15.4 04/11/2021 0745   LYMPHSABS 3.3 04/11/2021 0745   MONOABS 0.9 04/11/2021 0745   EOSABS 0.1 04/11/2021 0745   BASOSABS 0.1 04/11/2021 0745    No results found for: POCLITH, LITHIUM   No results found for: PHENYTOIN, PHENOBARB, VALPROATE, CBMZ   .res Assessment: Plan:    Plan:  PDMP reviewed  Wellbutrin XL 450mg  every morning. Denies seizure history. Propranolol 10mg  BID Hydroxyzine 25mg  - 1 to 2 at bedtime. Add Lexapro 10mg  daily  RTC 4 weeks  Diagnoses and all orders for this visit:  Generalized anxiety disorder  Panic attacks  Insomnia, unspecified type  Major  depressive disorder, recurrent episode, moderate (HCC)    Please see After Visit Summary for patient specific instructions.  Future Appointments  Date Time Provider Department Center  08/07/2021  9:00 AM Mathis Fare, LCSW [facility] None  08/21/2021  9:00 AM Mathis Fare, LCSW [facility] None  09/04/2021  9:00 AM Mathis Fare, LCSW [facility] None  09/18/2021  9:00 AM Mathis Fare, LCSW [facility] None  10/02/2021  9:00 AM Mathis Fare, LCSW [facility] None  10/16/2021  9:00 AM Mathis Fare, LCSW [facility] None    No orders of the defined types were placed in this encounter.   -------------------------------

## 2021-08-07 ENCOUNTER — Ambulatory Visit (INDEPENDENT_AMBULATORY_CARE_PROVIDER_SITE_OTHER): Payer: 59 | Admitting: Psychiatry

## 2021-08-07 ENCOUNTER — Other Ambulatory Visit: Payer: Self-pay

## 2021-08-07 DIAGNOSIS — F411 Generalized anxiety disorder: Secondary | ICD-10-CM

## 2021-08-07 NOTE — Progress Notes (Signed)
[facility] Counselor/Therapist Progress Note  Patient ID: Brianna Barr, MRN: [id number],    Date: 08/07/2021  Time Spent: 58 minutes   Treatment Type: Individual Therapy  Reported Symptoms: anxiety, depression  Mental Status Exam:  Appearance:   Casual and Neat     Behavior:  Appropriate, Sharing, and Motivated  Motor:  Normal  Speech/Language:   Clear and Coherent  Affect:  Flat and anxious  Mood:  anxious and depressed  Thought process:  goal directed  Thought content:    Some obsessiveness  Sensory/Perceptual disturbances:    WNL  Orientation:  oriented to person, place, time/date, situation, day of week, month of year, year, and stated date of Oct. 12, 2022  Attention:  Good  Concentration:  Good and Fair  Memory:  WNL  Fund of knowledge:   Good  Insight:    Good  Judgment:   Good  Impulse Control:  Good   Risk Assessment: Danger to Self:  No Self-injurious Behavior: No Danger to Others: No Duty to Warn:no Physical Aggression / Violence:No  Access to Firearms a concern: No  Gang Involvement:No   Subjective:  Patient in today reporting anxiety and depression related to personal health concerns, "future oriented concerns" ( moving, sister's wedding), family relationships, and work stressors. Reports her "mood baseline has improved some, generally some lessened sadness and depression." Worked on improving her level of motivation for trying more things that might help her mood and lessen anxiety.  Feeling some improvement in her depression and anxiety but "hard to articulate", although as she continued to share, she became more calm and realistic in her self expectations and also realizing she can't control everything. Is to be moving into her first house approx 09/06/2021 which has become very stressful for patient most recently. Worked more specifically on needing to set limits for herself and being able to take more risks for personal growth and trying to  connect more with people. Anxious thoughts have been some worse but have been more situational versus pervasive as they have been in the past. Sleep is still some better. Working to let go of more things "in the past" that she is needing to let go of in order to move forward. Wants a relationship at some point and trying to be more open. Trying to let go more of negative self talk and thinking she may never have a relationship and be loved by some one.  Working on not letting nor feeling controlled as much by others and being able to make more independent decisions for herself.  Feelings of unworthiness have declined some since last session and she continues to work with this.   Interventions: Solution-Oriented/Positive Psychology, Ego-Supportive, and Insight-Oriented  Diagnosis:   ICD-10-CM   1. Generalized anxiety disorder  F41.1            Treatment goal plan:   Patient not signing tx plan on computer screen due to COVID. Treatment Goals: Goals will remain on tx plan as patient works with strategies to meet her goals.  Progress will be noted each session and documented in "Progress" section of Plan. Long term goal: Reduce overall level, frequency, and intensity of the anxiety so that daily functioning is not impaired. Short term goal: Increase understanding of beliefs and messages that produce anxiety, depression, or "worries". Strategy: Identify, challenge, and replace anxious/depressive/fearful self-talk with positive, realistic, and empowering self-talk.     Plan:   Patient today showing good motivation and participation  in session today as she worked on issues of anxiety and depression related to personal health concerns, issues within family relationships, and work stressors as noted above.  Has only taken one extra shift at work which was within the past week but remains committed to not doing that anymore at this point.  Will only work her regular work hours right now.   Encouraged patient in regularly practicing positive behaviors including: Being open to meeting new friends when she has the opportunity, staying in touch with her core group of friends, consistent positive self talk, refrain from taking on extra work shifts at this point, interrupting anxious/negative/depressive thoughts and challenging them to replace with more realistic and empowering thoughts that do not feed her anxiety nor depression, identifying herself in positive ways, staying in the present focusing on what she can control, saying no when she needs to say no, allow for good sleep patterns, staying in contact with supportive people, set and keep healthy boundaries with others, refrain from over personalizing other family members comments, taking breaks as she can throughout the day, and realize the strength she is showing as she works with goal-directed behaviors to move in a direction that supports improved self-esteem, emotional health, and overall wellbeing.  Goal review and progress/challenges noted with patient.  Next appointment within 2 weeks.  This record has been created using [employer].  Chart creation errors have been sought, but may not always have been located and corrected.  Such creation errors do not reflect on the standard of medical care provided.    Mathis Fare, LCSW

## 2021-08-21 ENCOUNTER — Other Ambulatory Visit: Payer: Self-pay

## 2021-08-21 ENCOUNTER — Ambulatory Visit (INDEPENDENT_AMBULATORY_CARE_PROVIDER_SITE_OTHER): Payer: 59 | Admitting: Psychiatry

## 2021-08-21 DIAGNOSIS — F411 Generalized anxiety disorder: Secondary | ICD-10-CM | POA: Diagnosis not present

## 2021-08-21 NOTE — Progress Notes (Signed)
[facility] Counselor/Therapist Progress Note  Patient ID: Brianna Barr, MRN: [id number],    Date: 08/21/2021  Time Spent: 58 minutes   Treatment Type: Individual Therapy  Reported Symptoms: anxiety, depression  Mental Status Exam:  Appearance:   Casual and Neat     Behavior:  Appropriate, Sharing, and Motivated  Motor:  Normal  Speech/Language:   Clear and Coherent and Normal Rate  Affect:  Depressed and anxiety  Mood:  anxious and depressed  Thought process:  normal  Thought content:    Some obsessiveness, overthinking  Sensory/Perceptual disturbances:    WNL  Orientation:  oriented to person, place, time/date, situation, day of week, month of year, year, and stated date of Oct. 26, 2022  Attention:  Good  Concentration:  Good and Fair  Memory:  WNL  Fund of knowledge:   Good  Insight:    Good  Judgment:   Good  Impulse Control:  Good   Risk Assessment: Danger to Self:  No Self-injurious Behavior: No Danger to Others: No Duty to Warn:no Physical Aggression / Violence:No  Access to Firearms a concern: No   Gang Involvement:No   Subjective: Patient in today reporting anxiety and depression, with anxiety being the stronger symptom. Anxiety related to personal, family, and work stressors. Is moving out of parent's home soon and that has created additional stress for patient. Shared more concerns re: family relationships and health concerns. Some tearfulness as she explained how there's been multiple "things that have gone wrong in the process of trying to get through process of buying my first home." Processed a lot of her anxieties as well as depression linked to her moving and other personal/family issues. Also trying to better manage some "future" concerns and not feeling that she has to handle everything right now, but rather stick with some better boundaries for herself.Worked more on her positive self talk ad not assuming the worst., as she tried to limit  her negative thoughts and self-talk. Continues work on letting go of things in the past so it doesn't block her from moving forward.   Interventions: Ego-Supportive and Insight-Oriented  Diagnosis:   ICD-10-CM   1. Generalized anxiety disorder  F41.1      Treatment goal plan:   Patient not signing tx plan on computer screen due to COVID. Treatment Goals: Goals will remain on tx plan as patient works with strategies to meet her goals.  Progress will be noted each session and documented in "Progress" section of Plan. Long term goal: Reduce overall level, frequency, and intensity of the anxiety so that daily functioning is not impaired. Short term goal: Increase understanding of beliefs and messages that produce anxiety, depression, or "worries". Strategy: Identify, challenge, and replace anxious/depressive/fearful self-talk with positive, realistic, and empowering self-talk.    Plan: Patient today showing good motivation and engagement in session, working intentionally on her anxiety, some depression, and looking at what she can control or change and what she cannot.  Positive self-care and self-talk emphasized also with continued letting to of past so as to keep moving forward personally and emotionally.  Does report that she does have a sense more now that she is making more progress and admits not feeling good about herself has been an issue but one that she is addressing more directly at this point.  Encouraged patient with practicing positive behaviors including: Consistent positive self talk refraining from taking on extra work shifts at this point, being open to meeting new  friends when she has the opportunity, staying in touch with her small core group of friends, interrupting anxious/negative/depressive thoughts and challenging them to replace with more realistic and empowering thoughts that do not feed her anxiety nor depression, identifying herself in positive ways, staying in the  present focusing on what she can control, saying no when she needs to say no, allow for good sleep patterns, staying in contact with supportive people, setting and keeping healthy boundaries with others, refrain from over personalizing other family members comments, taking breaks as she can throughout the day, believing in herself more, and recognize the strength she shows working with goal-directed behaviors to move in a direction that supports improved emotional health, improved self esteem, and overall wellbeing.  Goal review and progress/challenges noted with.  Next appointment within 2 weeks.  This record has been created using [employer].  Chart creation errors have been sought, but may not always have been located and corrected.  Such creation errors do not reflect on the standard of medical care provided.     Mathis Fare, LCSW

## 2021-08-27 ENCOUNTER — Other Ambulatory Visit: Payer: Self-pay

## 2021-08-27 ENCOUNTER — Encounter: Payer: Self-pay | Admitting: Adult Health

## 2021-08-27 ENCOUNTER — Ambulatory Visit (INDEPENDENT_AMBULATORY_CARE_PROVIDER_SITE_OTHER): Payer: 59 | Admitting: Adult Health

## 2021-08-27 DIAGNOSIS — F411 Generalized anxiety disorder: Secondary | ICD-10-CM

## 2021-08-27 DIAGNOSIS — F331 Major depressive disorder, recurrent, moderate: Secondary | ICD-10-CM | POA: Diagnosis not present

## 2021-08-27 DIAGNOSIS — G4700 Insomnia, unspecified: Secondary | ICD-10-CM

## 2021-08-27 DIAGNOSIS — F410 Panic disorder [episodic paroxysmal anxiety] without agoraphobia: Secondary | ICD-10-CM | POA: Diagnosis not present

## 2021-08-27 MED ORDER — BUPROPION HCL ER (XL) 150 MG PO TB24: ORAL_TABLET | ORAL | 0 refills | Status: DC

## 2021-08-27 MED ORDER — HYDROXYZINE HCL 25 MG PO TABS: 25.0000 mg | ORAL_TABLET | Freq: Three times a day (TID) | ORAL | 0 refills | Status: DC | PRN

## 2021-08-27 MED ORDER — ESCITALOPRAM OXALATE 10 MG PO TABS: 10.0000 mg | ORAL_TABLET | Freq: Every day | ORAL | 0 refills | Status: DC

## 2021-08-27 MED ORDER — PROPRANOLOL HCL 10 MG PO TABS: 10.0000 mg | ORAL_TABLET | Freq: Two times a day (BID) | ORAL | 0 refills | Status: DC

## 2021-08-27 NOTE — Progress Notes (Signed)
Brianna Barr [id number] 06/10/1996 25 y.o.  Subjective:   Patient ID:  Brianna Barr is a 25 y.o. (DOB Sep 03, 1996) female.  Chief Complaint: No chief complaint on file.   HPI Brianna Barr presents to the office today for follow-up of MDD, GAD, panic attacks, and insomnia.  Describes mood today as "better". Pleasant. Tearful at times - "improved. Mood symptoms - reports decreased depression - "improved", anxiety - "improved", and irritability - "better". Decreased panic attacks - "few and far between. Decreased worry and rumination - "more justified". Stating "I'm feeling better". Improved interest and motivation. Planning to move out of parents in a few weeks - has purchased her first house. Taking medications as prescribed. Seeing therapist bi-weekly - Rockne Menghini. Energy levels "not as bad as it was". Active, does not have a regular exercise routine.  Enjoys some usual interests and activities. Single. Lives with parents and 2 younger sisters. Spending time with family. Has a core group of 3 friends. Appetite adequate. Weight stable. Sleeps better some nights than others. Averages 4 to 7 hours. Focus and concentration "decent enough". Completing tasks. Managing aspects of household. Works as a Engineer, civil (consulting) at [employer].  Denies SI or HI.  Denies AH or VH.  Previous medication trials: Zoloft in college, Viibryd, Wellbutrin   GAD-7    Flowsheet Row Office Visit from 06/25/2020 in [facility] from 11/03/2019 in [facility] from 09/29/2019 in [facility] from 08/18/2019 in [facility] at [facility] from 07/07/2019 in [facility] [place] [facility]  Total GAD-7 Score 18 7 17 19 14       PHQ2-9    Flowsheet Row Office Visit from 06/25/2020 in [facility] [place] Office Visit from 11/03/2019 in [facility] [place] from  09/29/2019 in [facility] [place] from 08/18/2019 in [facility] [place] from 07/07/2019 in [facility] [place] Total Score 4 3 4 3 2   PHQ-9 Total Score 16 9 12 11 11       Flowsheet Row ED from 06/06/2021 in [facility] ED from 03/14/2021 in [facility] RISK CATEGORY No Risk No Risk        Review of Systems:  Review of Systems  Musculoskeletal:  Negative for gait problem.  Neurological:  Negative for tremors.  Psychiatric/Behavioral:         Please refer to HPI   Medications: I have reviewed the patient's current medications.  Current Outpatient Medications  Medication Sig Dispense Refill   albuterol (VENTOLIN HFA) 108 (90 Base) MCG/ACT inhaler Inhale 2 puffs into the lungs every 6 (six) hours as needed for wheezing or shortness of breath. 6.7 g 1   buPROPion (WELLBUTRIN XL) 150 MG 24 hr tablet TAKE THREE TABLETS EVERY MORNING. 270 tablet 0   escitalopram (LEXAPRO) 10 MG tablet Take 1 tablet (10 mg total) by mouth daily. 90 tablet 0   hydrOXYzine (ATARAX/VISTARIL) 25 MG tablet Take 1 tablet (25 mg total) by mouth 3 (three) times daily as needed. 270 tablet 0   linaclotide (LINZESS) 145 MCG CAPS capsule Take 1 capsule (145 mcg total) by mouth daily before breakfast. 90 capsule 3   montelukast (SINGULAIR) 10 MG tablet TAKE 1 TABLET BY MOUTH EVERYDAY AT BEDTIME 90 tablet 0   mupirocin ointment (BACTROBAN) 2 % Apply 1 application topically 2 (two)  times daily. 30 g 0   oxybutynin (DITROPAN-XL) 10 MG 24 hr tablet Take 1 tablet (10 mg total) by mouth daily. 30 tablet 5   propranolol (INDERAL) 10 MG tablet Take 1 tablet (10 mg total) by mouth 2 (two) times daily. 180 tablet 0   No current facility-administered medications for this visit.    Medication Side Effects: None  Allergies: No Known Allergies  Past Medical History:  Diagnosis Date   Anxiety     Asthma    Depression    Overactive bladder    Stress incontinence     Past Medical History, Surgical history, Social history, and Family history were reviewed and updated as appropriate.   Please see review of systems for further details on the patient's review from today.   Objective:   Physical Exam:  There were no vitals taken for this visit.  Physical Exam Constitutional:      General: She is not in acute distress. Musculoskeletal:        General: No deformity.  Neurological:     Mental Status: She is alert and oriented to person, place, and time.     Coordination: Coordination normal.  Psychiatric:        Attention and Perception: Attention and perception normal. She does not perceive auditory or visual hallucinations.        Mood and Affect: Mood normal. Mood is not anxious or depressed. Affect is not labile, blunt, angry or inappropriate.        Speech: Speech normal.        Behavior: Behavior normal.        Thought Content: Thought content normal. Thought content is not paranoid or delusional. Thought content does not include homicidal or suicidal ideation. Thought content does not include homicidal or suicidal plan.        Cognition and Memory: Cognition and memory normal.        Judgment: Judgment normal.     Comments: Insight intact    Lab Review:     Component Value Date/Time   NA 139 04/11/2021 0745   K 4.5 04/11/2021 0745   CL 104 04/11/2021 0745   CO2 28 04/11/2021 0745   GLUCOSE 87 04/11/2021 0745   BUN 11 04/11/2021 0745   CREATININE 0.89 04/11/2021 0745   CALCIUM 9.1 04/11/2021 0745   PROT 6.6 04/11/2021 0745   ALBUMIN 3.9 04/11/2021 0745   AST 9 04/11/2021 0745   ALT 10 04/11/2021 0745   ALKPHOS 76 04/11/2021 0745   BILITOT 0.3 04/11/2021 0745       Component Value Date/Time   WBC 12.5 (H) 04/11/2021 0745   RBC 4.79 04/11/2021 0745   HGB 12.8 04/11/2021 0745   HCT 39.1 04/11/2021 0745   PLT 411.0 (H) 04/11/2021 0745   MCV 81.7  04/11/2021 0745   MCH 23.4 (L) 10/03/2020 1146   MCHC 32.7 04/11/2021 0745   RDW 15.4 04/11/2021 0745   LYMPHSABS 3.3 04/11/2021 0745   MONOABS 0.9 04/11/2021 0745   EOSABS 0.1 04/11/2021 0745   BASOSABS 0.1 04/11/2021 0745    No results found for: POCLITH, LITHIUM   No results found for: PHENYTOIN, PHENOBARB, VALPROATE, CBMZ   .res Assessment: Plan:    Plan:  PDMP reviewed  Wellbutrin XL 450mg  every morning. Denies seizure history. Propranolol 10mg  BID Hydroxyzine 25mg  - 1 to 2 at bedtime. Lexapro 10mg  daily  RTC 2 months  Diagnoses and all orders for this visit:  Panic attacks  Generalized anxiety disorder -  propranolol (INDERAL) 10 MG tablet; Take 1 tablet (10 mg total) by mouth 2 (two) times daily. -     escitalopram (LEXAPRO) 10 MG tablet; Take 1 tablet (10 mg total) by mouth daily. -     hydrOXYzine (ATARAX/VISTARIL) 25 MG tablet; Take 1 tablet (25 mg total) by mouth 3 (three) times daily as needed.  Major depressive disorder, recurrent episode, moderate (HCC) -     buPROPion (WELLBUTRIN XL) 150 MG 24 hr tablet; TAKE THREE TABLETS EVERY MORNING.  Insomnia, unspecified type    Please see After Visit Summary for patient specific instructions.  Future Appointments  Date Time Provider Department Center  09/04/2021  9:00 AM Mathis Fare, LCSW [facility] None  09/18/2021  9:00 AM Mathis Fare, LCSW [facility] None  10/02/2021  9:00 AM Mathis Fare, LCSW [facility] None  10/16/2021  9:00 AM Mathis Fare, LCSW [facility] None    No orders of the defined types were placed in this encounter.   -------------------------------

## 2021-09-04 ENCOUNTER — Ambulatory Visit: Payer: 59 | Admitting: Psychiatry

## 2021-09-18 ENCOUNTER — Other Ambulatory Visit: Payer: Self-pay

## 2021-09-18 ENCOUNTER — Ambulatory Visit (INDEPENDENT_AMBULATORY_CARE_PROVIDER_SITE_OTHER): Payer: 59 | Admitting: Psychiatry

## 2021-09-18 DIAGNOSIS — F411 Generalized anxiety disorder: Secondary | ICD-10-CM

## 2021-09-18 NOTE — Progress Notes (Signed)
[facility] Counselor/Therapist Progress Note  Patient ID: Brianna Barr, MRN: [id number],    Date: 09/18/2021  Time Spent: 58 minutes   Treatment Type: Individual Therapy  Reported Symptoms: anxiety, depression "but my stronger symptom is anxiety"  Mental Status Exam:  Appearance:   Neat     Behavior:  Appropriate, Sharing, and Motivated  Motor:  Normal  Speech/Language:   Clear and Coherent  Affect:  Depressed and anxious  Mood:  anxious and depressed  Thought process:  goal directed  Thought content:    Some obsessiveness  Sensory/Perceptual disturbances:    WNL  Orientation:  oriented to person, place, time/date, situation, day of week, month of year, year, and stated date of Nov. 23, 2022  Attention:  Good  Concentration:  Fair  Memory:  WNL  Fund of knowledge:   Good  Insight:    Good  Judgment:   Good  Impulse Control:  Good   Risk Assessment: Danger to Self:  No Self-injurious Behavior: No Danger to Others: No Duty to Warn:no Physical Aggression / Violence:No  Access to Firearms a concern: No  Gang Involvement:No   Subjective:   Patient in today reporting anxiety as her main symptom, along with depression. States depression is better, "I don't just sit around and be sad now, able to focus more on other things."  Moved to her first home 2 weeks ago and still has a lot to do "to get settled." Encountered several problems along the way in her move, including having the moving company cancelling short time before her moving date, in addition to other unexpected glitches with the moves. " Also exhausted from "the whole stress of moving and did not take time off from work." Focusing on better self-care, looking for more of the positives versus negative. Had baby shower for friend and felt it didn't go as well as planned. Lots of current stressors "but trying to stay in the present rather than worrying too much about the future concerns". Trying to let go of past  issues that can hold her back now and does see herself putting more effort into this.  Interventions: Cognitive Behavioral Therapy, Ego-Supportive, and Insight-Oriented  Treatment goal plan:   Patient not signing tx plan on computer screen due to COVID. Treatment Goals: Goals will remain on tx plan as patient works with strategies to meet her goals.  Progress will be noted each session and documented in "Progress" section of Plan. Long term goal: Reduce overall level, frequency, and intensity of the anxiety so that daily functioning is not impaired. Short term goal: Increase understanding of beliefs and messages that produce anxiety, depression, or "worries". Strategy: Identify, challenge, and replace anxious/depressive/fearful self-talk with positive, realistic, and empowering self-talk.    Diagnosis:   ICD-10-CM   1. Generalized anxiety disorder  F41.1       Plan: Patient today showing good motivation today and participation in session as she worked on her anxiety and depression, setting better boundaries, trying to stay in the present versus jumping ahead to future worries, having more realistic expectations of herself especially during times of change.  Emphasizing positive self talk and positive self-care.  Seems to recognize some of the progress she is making, although more slowly than she would like.  Encouraged her in practicing positive behaviors including: Believing in herself more, consistent positive self talk, refraining from taking on extra work shifts, being more open to meeting new friends when she has the opportunity, staying  in touch with her small core group of friends, interrupting anxious/negative/depressive thoughts and challenge them to replace with more realistic and empowering thoughts that do not feed her anxiety nor depression, identifying herself in more positive ways, staying in the present focusing on what she can change, saying no when she needs to say no, allow  for good sleep patterns, keeping her expectations realistic, staying in contact with supportive people, setting and keeping healthy boundaries, refrain from over personalizing the comments of other family members or people outside the family, taking breaks as she can throughout the day, and recognize the strength she shows working with goal-directed behaviors to move in a direction that supports improved emotional health.  Goal review and progress/challenges noted with patient.   Next appointment within 2 weeks.  This record has been created using [employer].  Chart creation errors have been sought, but may not always have been located and corrected.  Such creation errors do not reflect on the standard of medical care provided.    Mathis Fare, LCSW

## 2021-10-02 ENCOUNTER — Ambulatory Visit (INDEPENDENT_AMBULATORY_CARE_PROVIDER_SITE_OTHER): Payer: 59 | Admitting: Psychiatry

## 2021-10-02 ENCOUNTER — Other Ambulatory Visit: Payer: Self-pay

## 2021-10-02 DIAGNOSIS — F411 Generalized anxiety disorder: Secondary | ICD-10-CM | POA: Diagnosis not present

## 2021-10-02 NOTE — Progress Notes (Signed)
[facility] Counselor/Therapist Progress Note  Patient ID: Brianna Barr, MRN: [id number],    Date: 10/02/2021  Time Spent: 58 minutes   Treatment Type: Individual Therapy  Reported Symptoms: anxiety, depression "not as strong" and "I probably work on it more and feel more progress with". "Anxiety can be more difficult and hard to get away from because almost everything I do seems to carry some anxiety with it." States I've struggled with anxiety since I was a child but knows of no trauma or reasons that she can think of that could have been triggers.   Mental Status Exam:  Appearance:   Casual and Neat     Behavior:  Appropriate, Sharing, and Motivated  Motor:  Normal  Speech/Language:   Clear and Coherent  Affect:  Depressed and anxious  Mood:  anxious and depressed  Thought process:  goal directed  Thought content:    Overthinking, some obsessiveness  Sensory/Perceptual disturbances:    WNL  Orientation:  oriented to person, place, time/date, situation, day of week, month of year, year, and stated date of Dec. 7, 2022  Attention:  Good  Concentration:  Good  Memory:  WNL  Fund of knowledge:   Good  Insight:    Good/Fair  Judgment:   Good  Impulse Control:  Good   Risk Assessment: Danger to Self:  No Self-injurious Behavior: No Danger to Others: No Duty to Warn:no Physical Aggression / Violence:No  Access to Firearms a concern: No  Gang Involvement:No   Subjective:  Patient in today reporting anxiety and depression with the depression having decreased some. Discussed as noted above that she's been anxious since being a young child but knows of no reasons nor triggering events to her anxiety. Very difficult to follow through on strategies to decrease her anxiety, and improve her self-esteem/self-image. Seems more committed by session end and realizes it is a big challenge but she really wants to "push forward and make more progress on her anxiety" which often  leads to worrying.  Encouraged to stay more in the present rather than worrying about things in the past and also about things into the future.  Working harder today to let go of some things from the past that have held her back.  Interventions: Solution-Oriented/Positive Psychology, Ego-Supportive, and Insight-Oriented  Treatment goal plan:   Patient not signing tx plan on computer screen due to COVID. Treatment Goals: Goals will remain on tx plan as patient works with strategies to meet her goals.  Progress will be noted each session and documented in "Progress" section of Plan. Long term goal: Reduce overall level, frequency, and intensity of the anxiety so that daily functioning is not impaired. Short term goal: Increase understanding of beliefs and messages that produce anxiety, depression, or "worries". Strategy: Identify, challenge, and replace anxious/depressive/fearful self-talk with positive, realistic, and empowering self-talk.    Diagnosis:   ICD-10-CM   1. Generalized anxiety disorder  F41.1      Plan: Patient today showing good motivation and active participation in session today as she worked on primarily her anxiety, some longer-term family issues that have held her back, setting healthy boundaries, and trying to stay in the present rather than the past nor too far into the future.  Still encouraged her to have more realistic expectations of herself and her follow through and working on strategies outside of session.  Emphasized positive self-care and positive self talk which patient feels she is trying to do a little  more than usual.  Encouraged her and her practice of positive behaviors including: Believing more in herself and her ability to make significant changes, refraining from taking on extra work shifts which she is sticking to for right now, being more open to meeting new friends when she has the opportunity, consistent positive self talk, staying in touch with her  small core group of friends, interrupting anxious/negative/depressive thoughts and challenging them to replace with more realistic and empowering thoughts that do not feed her anxiety nor depression, identifying herself in more positive ways, staying in the present focusing on what she can change, saying no when she needs to say no, allow for good sleep patterns, keeping her expectations realistic, staying in contact with supportive people, setting and keeping healthy boundaries, refraining from over personalizing the comments of other people including her family, taking breaks as she can throughout the day, and feel good about the strength she shows working with goal-directed behaviors to move in a direction that supports improved emotional health.  Goal review and progress/challenges noted with patient.  Next appointment within 2 weeks.  This record has been created using [employer].  Chart creation errors have been sought, but may not always have been located and corrected.  Such creation errors do not reflect on the standard of medical care provided.   Mathis Fare, LCSW

## 2021-10-16 ENCOUNTER — Other Ambulatory Visit: Payer: Self-pay

## 2021-10-16 ENCOUNTER — Ambulatory Visit (INDEPENDENT_AMBULATORY_CARE_PROVIDER_SITE_OTHER): Payer: 59 | Admitting: Psychiatry

## 2021-10-16 DIAGNOSIS — F411 Generalized anxiety disorder: Secondary | ICD-10-CM

## 2021-10-16 NOTE — Progress Notes (Signed)
[facility] Counselor/Therapist Progress Note  Patient ID: Brianna Barr, MRN: [id number],    Date: 10/16/2021  Time Spent: 57 minutes  Treatment Type: Individual Therapy  Reported Symptoms: anxiety, depression  Mental Status Exam:  Appearance:   Casual and Neat     Behavior:  Appropriate, Sharing, and Motivated  Motor:  Normal  Speech/Language:   Clear and Coherent  Affect:  Depressed and sad, anxious  Mood:  anxious and depressed  Thought process:  goal directed  Thought content:    Overthinking, some obsessiveness  Sensory/Perceptual disturbances:    WNL  Orientation:  oriented to person, place, time/date, situation, day of week, and month of year, Dec. 21, 2022  Attention:  Good  Concentration:  Good and Fair  Memory:  WNL  Fund of knowledge:   Good  Insight:    Good  Judgment:   Good  Impulse Control:  Good   Risk Assessment: Danger to Self:  No Self-injurious Behavior: No Danger to Others: No Duty to Warn:no Physical Aggression / Violence:No  Access to Firearms a concern: No  Gang Involvement:No   Subjective: Patient in today reporting anxiety and depression. Stressed/anxious/depressed re: some family dynamics and stressful events coming up. "This brings up a lot of stuff from my past as well as things I'm working on in the present that feed her anxiety and depression. Very sensitive to "being alone and not in a relationship." Processed more of her feelings today re: "being single and alone", and talking through ways she might be more with others single and not feel that she doesn't belong. Enjoys attending and having connections but some difficulty with new relationships. Still helping encourage her mom after latest knee replacement, with some challenges. Working to be more focused with strategies discussed with patient including increasing motivation and not assuming worst case scenarios, but also setting limits as needed due to other things going on in  her family and life right now.  Continues to work on letting go in weeding out some things from her past that have contributed to her difficulty moving forward in the present.  Interventions: Solution-Oriented/Positive Psychology, Ego-Supportive, and Insight-Oriented  Treatment goal plan:   Patient not signing tx plan on computer screen due to COVID. Treatment Goals: Goals will remain on tx plan as patient works with strategies to meet her goals.  Progress will be noted each session and documented in "Progress" section of Plan. Long term goal: Reduce overall level, frequency, and intensity of the anxiety so that daily functioning is not impaired. Short term goal: Increase understanding of beliefs and messages that produce anxiety, depression, or "worries". Strategy: Identify, challenge, and replace anxious/depressive/fearful self-talk with positive, realistic, and empowering self-talk.   Diagnosis:   ICD-10-CM   1. Generalized anxiety disorder  F41.1      Plan: Patient today showing motivation which did seem to increase some during session as she actively participated in working on her anxiety, family related issues, having healthier boundaries, healthier self-esteem and ways of viewing herself more positively, and things from the past that tend to hold her back from moving forward.  Working to improve self-esteem more, including positive self-care and positive self talk.  Encouraged patient and her practice of positive behaviors including: Believing more in herself and her ability to make significant changes, refraining from taking on extra work shifts, being more open to meeting new friends when she has the opportunity, consistent positive self talk, staying in touch with her small  core group of friends, interrupting anxious/negative/depressive thoughts and challenging them to replace with more realistic and empowering thoughts that do not feed her anxiety nor depression, identifying herself in  more positive ways, staying in the present focusing on what she can change, saying no when she needs to say no without guilt, allow for good sleep patterns, keeping her expectations realistic, staying in contact with supportive people, setting and keeping healthy boundaries, refraining from over personalizing the comments of other people including her family, taking breaks as she can throughout the day, for every negative thought replace it with 2 positives, and recognize the strengths she shows working with goal-directed behaviors to move in a direction that supports improved emotional health and overall wellbeing.  Goal review and progress/challenges noted with patient.  Next appointment within 2X.  This record has been created using [employer].  Chart creation errors have been sought, but may not always have been located and corrected.  Such creation errors do not reflect on the standard of medical care provided.   Mathis Fare, LCSW

## 2021-10-29 ENCOUNTER — Ambulatory Visit (INDEPENDENT_AMBULATORY_CARE_PROVIDER_SITE_OTHER): Payer: Commercial Managed Care - PPO | Admitting: Adult Health

## 2021-10-29 ENCOUNTER — Other Ambulatory Visit: Payer: Self-pay

## 2021-10-29 ENCOUNTER — Encounter: Payer: Self-pay | Admitting: Adult Health

## 2021-10-29 DIAGNOSIS — F331 Major depressive disorder, recurrent, moderate: Secondary | ICD-10-CM | POA: Diagnosis not present

## 2021-10-29 DIAGNOSIS — F410 Panic disorder [episodic paroxysmal anxiety] without agoraphobia: Secondary | ICD-10-CM

## 2021-10-29 DIAGNOSIS — F411 Generalized anxiety disorder: Secondary | ICD-10-CM | POA: Diagnosis not present

## 2021-10-29 DIAGNOSIS — G4700 Insomnia, unspecified: Secondary | ICD-10-CM | POA: Diagnosis not present

## 2021-10-29 MED ORDER — PROPRANOLOL HCL 10 MG PO TABS: 10.0000 mg | ORAL_TABLET | Freq: Two times a day (BID) | ORAL | 3 refills | Status: DC

## 2021-10-29 MED ORDER — HYDROXYZINE HCL 25 MG PO TABS: 25.0000 mg | ORAL_TABLET | Freq: Three times a day (TID) | ORAL | 3 refills | Status: DC | PRN

## 2021-10-29 MED ORDER — BUPROPION HCL ER (XL) 150 MG PO TB24: ORAL_TABLET | ORAL | 3 refills | Status: DC

## 2021-10-29 MED ORDER — ESCITALOPRAM OXALATE 10 MG PO TABS: 10.0000 mg | ORAL_TABLET | Freq: Every day | ORAL | 3 refills | Status: DC

## 2021-10-29 NOTE — Progress Notes (Signed)
Brianna Kaufmannlana Dionne Hammar [id number] 12-Mar-1996 25 y.o.  Subjective:   Patient ID:  Brianna Barr is a 26 y.o. (DOB 12-Mar-1996) female.  Chief Complaint: No chief complaint on file.   HPI Brianna Barr presents to the office today for follow-up of MDD, GAD, panic attacks, and insomnia.  Describes mood today as "ok". Pleasant. Tearful at times - "improved. Mood symptoms - reports decreased depression - "seasonal", anxiety - "greatly improved", and irritability - "a little bit, not much". Decreased panic attacks - "a few - farther apart". Decreased worry and rumination - "a little bit of that left". Stating "I feel like I'm doing ok". Stable interest and motivation. Planning to move out of parents in a few weeks - has purchased her first house. Taking medications as prescribed. Seeing therapist bi-weekly - Rockne Menghiniebbie Dowd. Energy levels stable. Active, does not have a regular exercise routine.  Enjoys some usual interests and activities. Single. Lives alone. Parents and 2 younger sisters local. Spending time with family. Has a core group of 3 friends. Appetite adequate. Weight stable. Sleeps better some nights than others. Averages 4 to 7 hours - feels more rested. Focus and concentration "pretty good". Completing tasks. Managing aspects of household. Works as a Engineer, civil (consulting)nurse at [employer].  Denies SI or HI.  Denies AH or VH.  Previous medication trials: Zoloft in college, Viibryd, Wellbutrin   GAD-7    Flowsheet Row [facility]  Total GAD-7 Score 18 7 17 19 14       PHQ2-9    Flowsheet Row [facility]  PHQ-2 Total Score 4 3 4 3 2   PHQ-9 Total Score 16 9 12 11 11       Flow[facility]  C-SSRS RISK CATEGORY No Risk No Risk        Review of Systems:  Review of Systems  Musculoskeletal:  Negative for gait problem.  Neurological:  Negative for tremors.  Psychiatric/Behavioral:         Please refer to HPI   Medications: I have reviewed the patient's current medications.  Current Outpatient Medications  Medication Sig Dispense Refill   albuterol (VENTOLIN HFA) 108 (90 Base) MCG/ACT inhaler Inhale 2 puffs into the lungs every 6 (six) hours as needed for wheezing or shortness of breath. 6.7 g 1   buPROPion (WELLBUTRIN XL) 150 MG 24 hr tablet TAKE THREE TABLETS EVERY MORNING. 270 tablet 3   escitalopram (LEXAPRO) 10 MG tablet Take 1 tablet (10 mg total) by mouth daily. 90 tablet 3   hydrOXYzine (ATARAX) 25 MG tablet Take 1 tablet (25 mg total) by mouth 3 (three) times daily as needed. 270 tablet 3   linaclotide (LINZESS) 145 MCG CAPS capsule Take 1 capsule (145 mcg total) by mouth daily before breakfast. 90 capsule 3   montelukast (SINGULAIR) 10 MG tablet TAKE 1 TABLET BY MOUTH EVERYDAY AT BEDTIME 90 tablet 0  mupirocin ointment (BACTROBAN) 2 % Apply 1 application topically 2 (two) times daily. 30 g 0   oxybutynin (DITROPAN-XL) 10 MG 24 hr tablet Take 1 tablet (10 mg total) by mouth daily. 30 tablet 5   propranolol (INDERAL) 10 MG tablet Take 1 tablet (10 mg total) by mouth 2 (two) times daily. 180 tablet 3   No current facility-administered medications for this visit.    Medication Side Effects: None  Allergies: No Known Allergies  Past Medical  History:  Diagnosis Date   Anxiety    Asthma    Depression    Overactive bladder    Stress incontinence     Past Medical History, Surgical history, Social history, and Family history were reviewed and updated as appropriate.   Please see review of systems for further details on the patient's review from today.   Objective:   Physical Exam:  There were no vitals taken for this visit.  Physical Exam Constitutional:      General: She is not in acute distress. Musculoskeletal:        General: No deformity.  Neurological:     Mental Status: She is alert and oriented to person, place, and time.     Coordination: Coordination normal.  Psychiatric:        Attention and Perception: Attention and perception normal. She does not perceive auditory or visual hallucinations.        Mood and Affect: Mood normal. Mood is not anxious or depressed. Affect is not labile, blunt, angry or inappropriate.        Speech: Speech normal.        Behavior: Behavior normal.        Thought Content: Thought content normal. Thought content is not paranoid or delusional. Thought content does not include homicidal or suicidal ideation. Thought content does not include homicidal or suicidal plan.        Cognition and Memory: Cognition and memory normal.        Judgment: Judgment normal.     Comments: Insight intact    Lab Review:     Component Value Date/Time   NA 139 04/11/2021 0745   K 4.5 04/11/2021 0745   CL 104 04/11/2021 0745   CO2 28 04/11/2021 0745   GLUCOSE 87 04/11/2021 0745   BUN 11 04/11/2021 0745   CREATININE 0.89 04/11/2021 0745   CALCIUM 9.1 04/11/2021 0745   PROT 6.6 04/11/2021 0745   ALBUMIN 3.9 04/11/2021 0745   AST 9 04/11/2021 0745   ALT 10 04/11/2021 0745   ALKPHOS 76 04/11/2021 0745   BILITOT 0.3 04/11/2021 0745       Component Value Date/Time   WBC 12.5 (H) 04/11/2021 0745   RBC 4.79 04/11/2021 0745   HGB 12.8 04/11/2021 0745   HCT 39.1 04/11/2021 0745   PLT 411.0  (H) 04/11/2021 0745   MCV 81.7 04/11/2021 0745   MCH 23.4 (L) 10/03/2020 1146   MCHC 32.7 04/11/2021 0745   RDW 15.4 04/11/2021 0745   LYMPHSABS 3.3 04/11/2021 0745   MONOABS 0.9 04/11/2021 0745   EOSABS 0.1 04/11/2021 0745   BASOSABS 0.1 04/11/2021 0745    No results found for: POCLITH, LITHIUM   No results found for: PHENYTOIN, PHENOBARB, VALPROATE, CBMZ   .res Assessment: Plan:    Plan:  PDMP reviewed  Wellbutrin XL 450mg  every morning. Denies seizure history. Propranolol 10mg  BID Hydroxyzine 25mg  - 1 to 2 at bedtime. Lexapro 10mg  daily  RTC 3 months    Diagnoses  and all orders for this visit:  Panic attacks  Major depressive disorder, recurrent episode, moderate (HCC) -     buPROPion (WELLBUTRIN XL) 150 MG 24 hr tablet; TAKE THREE TABLETS EVERY MORNING.  Generalized anxiety disorder -     escitalopram (LEXAPRO) 10 MG tablet; Take 1 tablet (10 mg total) by mouth daily. -     hydrOXYzine (ATARAX) 25 MG tablet; Take 1 tablet (25 mg total) by mouth 3 (three) times daily as needed. -     propranolol (INDERAL) 10 MG tablet; Take 1 tablet (10 mg total) by mouth 2 (two) times daily.  Insomnia, unspecified type     Please see After Visit Summary for patient specific instructions.  Future Appointments  Date Time Provider Department Center  11/05/2021 12:00 PM Mathis Fare, LCSW [facility] None  11/19/2021 10:00 AM Mathis Fare, LCSW [facility] None  12/04/2021  9:00 AM Mathis Fare, LCSW [facility] None  12/18/2021  9:00 AM Mathis Fare, LCSW [facility] None    No orders of the defined types were placed in this encounter.   -------------------------------

## 2021-11-05 ENCOUNTER — Ambulatory Visit: Payer: 59 | Admitting: Psychiatry

## 2021-11-13 ENCOUNTER — Encounter: Payer: Self-pay | Admitting: Family Medicine

## 2021-11-13 ENCOUNTER — Ambulatory Visit: Payer: Commercial Managed Care - PPO | Admitting: Family Medicine

## 2021-11-13 VITALS — BP 122/76 | HR 91 | Temp 98.2°F | Wt 231.0 lb

## 2021-11-13 DIAGNOSIS — R82998 Other abnormal findings in urine: Secondary | ICD-10-CM | POA: Diagnosis not present

## 2021-11-13 DIAGNOSIS — M549 Dorsalgia, unspecified: Secondary | ICD-10-CM | POA: Diagnosis not present

## 2021-11-13 DIAGNOSIS — M545 Low back pain, unspecified: Secondary | ICD-10-CM

## 2021-11-13 DIAGNOSIS — F32A Depression, unspecified: Secondary | ICD-10-CM

## 2021-11-13 DIAGNOSIS — E559 Vitamin D deficiency, unspecified: Secondary | ICD-10-CM | POA: Diagnosis not present

## 2021-11-13 DIAGNOSIS — R630 Anorexia: Secondary | ICD-10-CM

## 2021-11-13 DIAGNOSIS — F419 Anxiety disorder, unspecified: Secondary | ICD-10-CM

## 2021-11-13 DIAGNOSIS — R5383 Other fatigue: Secondary | ICD-10-CM

## 2021-11-13 DIAGNOSIS — M722 Plantar fascial fibromatosis: Secondary | ICD-10-CM

## 2021-11-13 DIAGNOSIS — Z6841 Body Mass Index (BMI) 40.0 and over, adult: Secondary | ICD-10-CM | POA: Diagnosis not present

## 2021-11-13 DIAGNOSIS — R319 Hematuria, unspecified: Secondary | ICD-10-CM

## 2021-11-13 LAB — BASIC METABOLIC PANEL
BUN: 13 mg/dL (ref 6–23)
CO2: 27 mEq/L (ref 19–32)
Calcium: 9.1 mg/dL (ref 8.4–10.5)
Chloride: 103 mEq/L (ref 96–112)
Creatinine, Ser: 0.81 mg/dL (ref 0.40–1.20)
GFR: 100.72 mL/min (ref >60.00)
Glucose, Bld: 113 mg/dL — ABNORMAL HIGH (ref 70–99)
Potassium: 4.2 mEq/L (ref 3.5–5.1)
Sodium: 137 mEq/L (ref 135–145)

## 2021-11-13 LAB — POCT URINALYSIS DIPSTICK
Bilirubin, UA: NEGATIVE
Glucose, UA: NEGATIVE
Ketones, UA: NEGATIVE
Leukocytes, UA: NEGATIVE
Nitrite, UA: NEGATIVE
Protein, UA: NEGATIVE
Spec Grav, UA: 1.025 (ref 1.010–1.025)
Urobilinogen, UA: NEGATIVE E.U./dL — AB
pH, UA: 6 (ref 5.0–8.0)

## 2021-11-13 LAB — CBC WITH DIFFERENTIAL/PLATELET
Basophils Absolute: 0.1 10*3/uL (ref 0.0–0.1)
Basophils Relative: 0.7 % (ref 0.0–3.0)
Eosinophils Absolute: 0.2 10*3/uL (ref 0.0–0.7)
Eosinophils Relative: 2.2 % (ref 0.0–5.0)
HCT: 39.6 % (ref 36.0–46.0)
Hemoglobin: 12.8 g/dL (ref 12.0–15.0)
Lymphocytes Relative: 24.4 % (ref 12.0–46.0)
Lymphs Abs: 2.7 10*3/uL (ref 0.7–4.0)
MCHC: 32.4 g/dL (ref 30.0–36.0)
MCV: 82.7 fl (ref 78.0–100.0)
Monocytes Absolute: 0.6 10*3/uL (ref 0.1–1.0)
Monocytes Relative: 5.7 % (ref 3.0–12.0)
Neutro Abs: 7.5 10*3/uL (ref 1.4–7.7)
Neutrophils Relative %: 67 % (ref 43.0–77.0)
Platelets: 336 10*3/uL (ref 150.0–400.0)
RBC: 4.79 Mil/uL (ref 3.87–5.11)
RDW: 14 % (ref 11.5–15.5)
WBC: 11.1 10*3/uL — ABNORMAL HIGH (ref 4.0–10.5)

## 2021-11-13 LAB — HEMOGLOBIN A1C: Hgb A1c MFr Bld: 5.5 % (ref 4.6–6.5)

## 2021-11-13 LAB — T4, FREE: Free T4: 1.05 ng/dL (ref 0.60–1.60)

## 2021-11-13 LAB — TSH: TSH: 4.12 u[IU]/mL (ref 0.35–5.50)

## 2021-11-13 LAB — VITAMIN B12: Vitamin B-12: 539 pg/mL (ref 211–911)

## 2021-11-13 LAB — VITAMIN D 25 HYDROXY (VIT D DEFICIENCY, FRACTURES): VITD: 21.31 ng/mL — ABNORMAL LOW (ref 30.00–100.00)

## 2021-11-13 MED ORDER — CYCLOBENZAPRINE HCL 5 MG PO TABS: 2.5000 mg | ORAL_TABLET | Freq: Every evening | ORAL | 0 refills | Status: DC | PRN

## 2021-11-13 NOTE — Progress Notes (Signed)
Subjective:    Patient ID: Brianna Barr, female    DOB: 04-23-1996, 26 y.o.   MRN: [id number]  Chief Complaint  Patient presents with   Follow-up    A1c showed pre DM last time. Urine smells like candy   Back Pain    Lower back spasms, constant pain. Going on a while, possibly 1 year. Urine smells like candy    HPI Patient was seen today for ongoing concerns.  Patient wants to follow-up/repeat labs from 04/11/2021.  Patient also mentions a sweet odor to [employer]" x 6-8 weeks.  Patient followed by urology and in pelvic floor PT.  Patient forgets to eat during the day or may have 1 meal.  States that depression symptoms have improved slightly.  Still having difficulty with sleep.  May sleep for hours or 12 hours at a time.  Patient endorses intermittent fatigue x 5-6 years.  This latest episode of fatigue has been going on for 6-7 months.  Seeing a psychiatrist.  Ladon Applebaum recent change to medications.  Taking Lexapro and Wellbutrin XL.  Seen by podiatry for plantar fasciitis.  Had 1 injection several months ago.  Does stretching exercises each morning.  Endorses pain/aching in upper and lower back times several months plus.  Patient states pain typically 8-9/10 when getting off work and may go to a 5/10 after using a heating pad.  Patient wearing custom orthotics in shoes for work.  Using Tiger balm and other topical analgesics as well as OTC p.o. analgesic.  In the past went to a chiropractor.  Also tried massage, however states it made her "sick".  Pt had emesis a few hours after the massage.  Past Medical History:  Diagnosis Date   Anxiety    Asthma    Depression    Overactive bladder    Stress incontinence   Social history: Patient is a Marine scientist.  No Known Allergies  ROS General: Denies fever, chills, night sweats, changes in weight + changes in appetite, fatigue HEENT: Denies headaches, ear pain, changes in vision, rhinorrhea, sore throat CV: Denies CP, palpitations, SOB,  orthopnea Pulm: Denies SOB, cough, wheezing GI: Denies abdominal pain, nausea, vomiting, diarrhea, constipation GU: Denies dysuria, hematuria, frequency, vaginal discharge Msk: Denies muscle cramps, joint pains  + back pain, knee pain Neuro: Denies weakness, numbness, tingling Skin: Denies rashes, bruising Psych: Denies hallucinations  + anxiety, depression insomnia     Objective:    Blood pressure 122/76, pulse 91, temperature 98.2 F (36.8 C), temperature source Oral, weight 231 lb (104.8 kg), SpO2 97 %.  Gen. Pleasant, well-nourished, in no distress, normal affect   HEENT: [place]/AT, face symmetric, conjunctiva clear, no scleral icterus, PERRLA, EOMI, nares patent without drainage Lungs: no accessory muscle use, CTAB, no wheezes or rales Cardiovascular: RRR, no m/r/g, no peripheral edema Musculoskeletal: TTP of cervical, thoracic, lumbar spine, and paraspinal muscles.  Tension in bilateral trapezius muscles.  Pain with full flexion and extension of spine.  no deformities, no cyanosis or clubbing, normal tone Neuro:  A&Ox3, CN II-XII intact, normal gait Skin:  Warm, no lesions/ rash   Wt Readings from Last 3 Encounters:  04/04/21 229 lb (103.9 kg)  03/13/21 226 lb (102.5 kg)  02/15/21 231 lb 3.2 oz (104.9 kg)    Lab Results  Component Value Date   WBC 12.5 (H) 04/11/2021   HGB 12.8 04/11/2021   HCT 39.1 04/11/2021   PLT 411.0 (H) 04/11/2021   GLUCOSE 87 04/11/2021   ALT 10 04/11/2021  AST 9 04/11/2021   NA 139 04/11/2021   K 4.5 04/11/2021   CL 104 04/11/2021   CREATININE 0.89 04/11/2021   BUN 11 04/11/2021   CO2 28 04/11/2021   TSH 6.67 (H) 04/11/2021   HGBA1C 5.7 04/11/2021   Depression screen PHQ 2/9 11/13/2021 06/25/2020 11/03/2019  Decreased Interest 2 2 1   Down, Depressed, Hopeless 2 2 2   PHQ - 2 Score 4 4 3   Altered sleeping 3 3 2   Tired, decreased energy 2 2 2   Change in appetite 2 2 0  Feeling bad or failure about yourself  1 2 1   Trouble concentrating 3 2  1   Moving slowly or fidgety/restless 0 1 0  Suicidal thoughts 0 0 0  PHQ-9 Score 15 16 9   Difficult doing work/chores - - -    Assessment/Plan:  Low back pain, unspecified back pain laterality, unspecified chronicity, unspecified whether sciatica present  -Discussed possible causes including muscle tension 2/2 stress, weight gain -Consider water aerobics, acupuncture, massage. - Plan: POCT urinalysis dipstick, CBC with Differential/Platelet, cyclobenzaprine (FLEXERIL) 5 MG tablet, Ambulatory referral to Physical Therapy  Upper back pain  - Plan: CBC with Differential/Platelet, cyclobenzaprine (FLEXERIL) 5 MG tablet, Ambulatory referral to Physical Therapy  Sweet urine odor -POC UA with 1+ hematuria as noted on prior Uas.  - Plan: Hemoglobin A1c  Other fatigue  -Likely multifactorial 2/2 anxiety, depression, vitamin deficiencies, insomnia, etc. - Plan: Hemoglobin A1c, TSH, T4, Free, Vitamin D, 25-hydroxy, Vitamin [id number], Basic metabolic panel  Decreased appetite  -Likely 2/2 increased anxiety/depression -Discussed the importance of eating regular meals -Continue follow-up with psychiatry - Plan: Hemoglobin A1c  Vitamin D deficiency  - Plan: Vitamin D, 25-hydroxy  Class 3 severe obesity due to excess calories with serious comorbidity and body mass index (BMI) of 40.0 to 44.9 in adult ([place]) -Lifestyle modifications encouraged - Plan: TSH, T4, Free, Vitamin D, 25-hydroxy, CBC with Differential/Platelet  Plantar fasciitis -Stable -Status post steroid injections -Continue stretching exercises, custom orthotics -Continue follow-up with podiatry  Hematuria, unspecified type -Chronic -Noted again on UA this visit -Continue follow-up with urology  Anxiety and depression -Stable -PHQ-9 score 15 -Continue current medications including Wellbutrin and Lexapro -Continue follow-up with psychiatry  F/u as needed  Grier Mitts, MD

## 2021-11-15 ENCOUNTER — Other Ambulatory Visit: Payer: Self-pay | Admitting: Family Medicine

## 2021-11-15 DIAGNOSIS — E559 Vitamin D deficiency, unspecified: Secondary | ICD-10-CM

## 2021-11-15 MED ORDER — VITAMIN D (ERGOCALCIFEROL) 1.25 MG (50000 UNIT) PO CAPS: 50000.0000 [IU] | ORAL_CAPSULE | ORAL | 0 refills | Status: DC

## 2021-11-19 ENCOUNTER — Encounter: Payer: Self-pay | Admitting: Physical Therapy

## 2021-11-19 ENCOUNTER — Ambulatory Visit (INDEPENDENT_AMBULATORY_CARE_PROVIDER_SITE_OTHER): Payer: Commercial Managed Care - PPO | Admitting: Psychiatry

## 2021-11-19 ENCOUNTER — Ambulatory Visit: Payer: Commercial Managed Care - PPO | Attending: Family Medicine | Admitting: Physical Therapy

## 2021-11-19 ENCOUNTER — Other Ambulatory Visit: Payer: Self-pay

## 2021-11-19 DIAGNOSIS — G8929 Other chronic pain: Secondary | ICD-10-CM | POA: Insufficient documentation

## 2021-11-19 DIAGNOSIS — M549 Dorsalgia, unspecified: Secondary | ICD-10-CM | POA: Insufficient documentation

## 2021-11-19 DIAGNOSIS — R252 Cramp and spasm: Secondary | ICD-10-CM | POA: Diagnosis present

## 2021-11-19 DIAGNOSIS — M545 Low back pain, unspecified: Secondary | ICD-10-CM | POA: Diagnosis present

## 2021-11-19 DIAGNOSIS — R293 Abnormal posture: Secondary | ICD-10-CM | POA: Insufficient documentation

## 2021-11-19 DIAGNOSIS — M6281 Muscle weakness (generalized): Secondary | ICD-10-CM | POA: Insufficient documentation

## 2021-11-19 DIAGNOSIS — F411 Generalized anxiety disorder: Secondary | ICD-10-CM | POA: Diagnosis not present

## 2021-11-19 NOTE — Progress Notes (Signed)
[facility] Counselor/Therapist Progress Note  Patient ID: Brianna Barr, MRN: [id number],    Date: 11/19/2021  Time Spent:  58 minutes  Treatment Type: Individual Therapy  Reported Symptoms: anxiety "worse than panic", depression. "States anxiety is her main symptom for now."  Mental Status Exam:  Appearance:   Casual     Behavior:  Appropriate, Sharing, and Motivated  Motor:  Normal  Speech/Language:   Clear and Coherent  Affect:  Depressed and anxiety  Mood:  anxious and depressed  Thought process:  goal directed  Thought content:    Some overthinking and obsessiveness  Sensory/Perceptual disturbances:    WNL  Orientation:  oriented to person, place, time/date, situation, day of week, month of year, year, and stated date of Jan. 24, 2023  Attention:  Good  Concentration:  Good and Fair  Memory:  WNL  Fund of knowledge:   Good  Insight:    Good  Judgment:   Good  Impulse Control:  Good   Risk Assessment: Danger to Self:  No Self-injurious Behavior: No Danger to Others: No Duty to Warn:no Physical Aggression / Violence:No  Access to Firearms a concern: No  Gang Involvement:No   Subjective:  Patient in today reporting anxiety, depression.  "Anxiety is main symptom." "I think my mood has been better, not consistently better but more better days." Is getting used to having a house but can be isolating at times.  Have let friends know about the isolation and makes efforts to be with friends more and friends drop by her home more often. Helping mom some as she can after mom's knee replacement surgery. Anxiety and depression mostly related to personal, work, family, and relationships/dating concerns which she processed today. Has started attending a new church, smaller and is getting to know a few people, and signed up to be in a group. Shared and discussed someone she met on a dating site online and some mixed feeling about the other person and about patient herself.  "Issues for me being single and alone" which she talked through more today and seemed to feel a little better regarding these issues.  Continues to work on situations from her past that have contributed to her difficulty moving forward.  Remaining focused with strategies which helps her in motivation and trying to not assume worst case scenarios, and having healthier limits for herself at work and within her family.  Interventions: Solution-Oriented/Positive Psychology, Ego-Supportive, and Insight-Oriented  Treatment goal plan:   Patient not signing tx plan on computer screen due to [place]. Treatment Goals: Goals will remain on tx plan as patient works with strategies to meet her goals.  Progress will be noted each session and documented in "Progress" section of Plan. Long term goal: Reduce overall level, frequency, and intensity of the anxiety so that daily functioning is not impaired. Short term goal: Increase understanding of beliefs and messages that produce anxiety, depression, or "worries". Strategy: Identify, challenge, and replace anxious/depressive/fearful self-talk with positive, realistic, and empowering self-talk.   Diagnosis:   ICD-10-CM   1. Generalized anxiety disorder  F41.1      Plan:  Patient today showing motivation and participated well in session working on her anxiety and depression, fewer panic attacks and feels her meds have helped the panic symptoms.  Working today on her anxiety and some depression related to some of her family issues and also not being in an ongoing relationship with someone.  Did reach out on a chance to  meet someone but that does not seem to have move forward just yet although there are some unknowns in the situation due to the other person's work schedule and training.  Continues to address family related issues, practicing healthier boundaries, and taking good risks such as checking out a new church and she has continued to attend there several  Sundays and meeting people.  Has signed up for a small group there and waiting to see how that plays out.  Continuing to focus on thoughts and feelings from past that tend to hold her back.  Encouraging her in practicing more positive behaviors including: Reflecting on any progress more often, for every negative thought replace with 2 positives, believing in herself more in her ability to make significant changes, refraining from taking on extra work shifts, being more open to meeting new friends when she has the opportunity, consistent positive self talk, staying in touch with her small core group of friends, interrupting anxious/negative/depressive thoughts and challenging them to replace with more realistic and empowering thoughts that do not feed her anxiety nor depression, identifying herself in more positive ways, staying in the present focusing on what she can change, saying no when she needs to say no without guilt, allow for good sleep patterns, keeping her expectations realistic, staying in contact with supportive people, setting and keeping healthy boundaries, refraining from over personalizing the comments of other people including her family, taking breaks as she can throughout the day, and recognize the strength she shows working with goal-directed behaviors to move in a direction that supports improved emotional health.  Goal review and progress/challenges noted with patient.  Next appointment within 2 to 3 weeks.  This record has been created using [employer].  Chart creation errors have been sought, but may not always have been located and corrected.  Such creation errors do not reflect on the standard of medical care provided.  Shanon Ace, LCSW

## 2021-11-19 NOTE — Patient Instructions (Signed)
Access Code: [email or web address] URL: [email or web address] Date: 11/19/2021 Prepared by: Loistine Simas Coy R[place]  Exercises Seated Hamstring Stretch - 2 x daily - 7 x weekly - 1 sets - 2 reps - 30 hold Seated Flexion Stretch - 2 x daily - 7 x weekly - 1 sets - 3 reps - 30 hold Child's Pose Stretch - 2 x daily - 7 x weekly - 1 sets - 3 reps - 10 hold Child's Pose with Sidebending - 2 x daily - 7 x weekly - 1 sets - 3 reps - 10 hold

## 2021-11-19 NOTE — Therapy (Signed)
[facility] @ [facility] [address] [place], [place], [address] Phone: [phone]   Fax:  [phone]  Physical Therapy Evaluation  Patient Details  Name: Brianna Barr MRN: [id number] Date of Birth: 11/15/1995 Referring Provider (PT): Deeann Saint, MD   Encounter Date: 11/19/2021   PT End of Session - 11/19/21 0946     Visit Number 1    Date for PT Re-Evaluation 01/14/22    Authorization Type UMR/[employer], [employer] employee    Authorization Time Period through 10/26/22    Authorization - Visit Number 1    Authorization - Number of Visits 60    PT Start Time [phone]    PT Stop Time 0930    PT Time Calculation (min) 44 min    Activity Tolerance Patient tolerated treatment well    Behavior During Therapy [facility] for tasks assessed/performed             Past Medical History:  Diagnosis Date   Anxiety    Asthma    Depression    Overactive bladder    Stress incontinence     Past Surgical History:  Procedure Laterality Date   WISDOM TOOTH EXTRACTION      There were no vitals filed for this visit.    Subjective Assessment - 11/19/21 0847     Subjective Pt referred to OPPT with upper back/shoulder pain and LBP worse with working, lifting and carrying (Pt is a Engineer, civil (consulting) with [employer]).  Pain extends occassionally down lateral hips and thighs but not past knees.  Symptoms are chronic but worsening.    Pertinent History PMH: bil plantar fasciitis, vit D deficiency, fatigue, obesity, anxiety, depression    Limitations Sitting;Lifting;Standing    How long can you sit comfortably? 45-60 min (LBP)    How long can you stand comfortably? 20-30 min    How long can you walk comfortably? can improve LBP but bil feet pain limits walking    Diagnostic tests none    Patient Stated Goals less pain    Currently in Pain? Yes    Pain Score 2    can get to 10/10 but mostly up to 4-5/10   Pain Location Back    Pain Orientation Lower;Mid    Pain  Descriptors / Indicators Dull;Aching;Tightness    Pain Type Chronic pain    Pain Radiating Towards bil hips and lateral thighs intermittently    Pain Onset More than a month ago    Pain Frequency Constant    Aggravating Factors  prolonged sitting, standing > 20 min, bend/lift/carry    Pain Relieving Factors OTC meds, heat, topicals    Effect of Pain on Daily Activities pain after working shifts    Multiple Pain Sites Yes    Pain Score 2   ranges 2-8/10   Pain Location Back    Pain Orientation Upper;Right;Left    Pain Descriptors / Indicators Aching;Dull;Tightness;Burning    Pain Type Chronic pain    Pain Radiating Towards bil shoulders and intrascapular area    Pain Onset More than a month ago    Pain Frequency Constant    Aggravating Factors  bend/lift/carry, turning head    Pain Relieving Factors heat, OTC meds, topicals    Effect of Pain on Daily Activities pain at work                [facility] PT Assessment - 11/19/21 0001       Assessment   Medical Diagnosis M54.50 (  ICD-10-CM) - Low back pain, unspecified back pain laterality, unspecified chronicity, unspecified whether sciatica present M54.9 (ICD-10-CM) - Upper back pain    Referring Provider (PT) Deeann SaintBanks, Shannon R, MD    Onset Date/Surgical Date --   chronic, worsened over past 6-8 mos   Hand Dominance Right    Next MD Visit 12/14/21    Prior Therapy not for this pain, did pelvic PT in past      Precautions   Precautions None      Restrictions   Weight Bearing Restrictions No      Balance Screen   Has the patient fallen in the past 6 months No    Has the patient had a decrease in activity level because of a fear of falling?  No    Is the patient reluctant to leave their home because of a fear of falling?  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[employer])    Leisure watch TV, takes dog out, arts/crafts      Observation/Other Assessments   Focus on Therapeutic Outcomes (FOTO)  44% goal 61%      Functional Tests   Functional tests Squat      Squat   Comments lack of core control for lumbar spine lordosis, lack of hip hinge use      Posture/Postural Control   Posture/Postural Control Postural limitations    Postural Limitations Rounded Shoulders;Increased lumbar lordosis;Decreased thoracic kyphosis    Posture Comments sway back posture      ROM / Strength   AROM / PROM / Strength AROM;Strength;PROM      AROM   AROM Assessment Site Lumbar;Cervical    Cervical Flexion 45    Cervical Extension 35    Cervical - Right Side Bend 28    Cervical - Left Side Bend 40    Cervical - Right Rotation 70    Cervical - Left Rotation 70    Lumbar Flexion fingers to within 4 inches of floor, hamstring and lumbar stretch    Lumbar Extension full with pain    Lumbar - Right Side Bend full with contralateral stretch    Lumbar - Left Side Bend full with contralateral stretch    Lumbar - Right Rotation full with LBP    Lumbar - Left Rotation full with LBP      PROM   Overall PROM Comments bil hips limited 50% bil with pain on assessment      Strength   Overall Strength Comments bil shoulders and intrascapular strength 4-/5, hip flexion 4/5, hip abd and ext 4-/5    Strength Assessment Site Hip;Knee    Right/Left Hip Right;Left    Right Hip Flexion 4/5    Right Hip Extension 4-/5    Right Hip External Rotation  4-/5    Right Hip Internal Rotation 4-/5    Right Hip ABduction 4-/5    Right Hip ADduction 3+/5    Left Hip Flexion 4/5    Left Hip Extension 4-/5    Left Hip External Rotation 4-/5    Left Hip Internal Rotation  3+/5    Left Hip ABduction 4-/5    Left Hip ADduction 3+/5    Right/Left  Knee Right;Left    Right Knee Flexion 3+/5    Right Knee Extension 4/5    Left Knee Flexion 3+/5    Left Knee Extension 4/5      Flexibility   Soft Tissue Assessment /Muscle Length yes    Hamstrings 45 deg bil    Quadriceps end range limited bil    Piriformis limited 35% bil    Quadratus Lumborum limited end range bil      Palpation   Spinal mobility pain with lumbar PAs L3-L5    Palpation comment bil upper traps, levator, rhomboids.  bil lumbar paraspinals, QL, gluteals, piriformis      Special Tests    Special Tests Hip Special Tests;Lumbar    Lumbar Tests other    Hip Special Tests  Luisa Hart (FABER) Test;Hip Scouring      other   Comments relief with prone sacral distraction      Luisa Hart (FABER) Test   Findings Positive    Comments bil      Hip Scouring   Findings Positive    Side --   bil   Comments + FADIR                        Objective measurements completed on examination: See above findings.       [facility] Adult PT Treatment/Exercise - 11/19/21 0001       Self-Care   Self-Care Posture    Posture standing alignment sternum over pubic bone, widen collar bones to correct rounded shoulders, release buttock gripping and find core.  Practiced dynamic functional squat using alignment and hip hinge with TA                     PT Education - 11/19/21 0932     Education Details Access Code: [email or web address]    Person(s) Educated Patient    Methods Explanation;Demonstration;Handout    Comprehension Verbalized understanding;Returned demonstration              PT Short Term Goals - 11/19/21 0948       PT SHORT TERM GOAL #1   Title Pt will be ind with initial HEP    Time 2    Period Weeks    Status New    Target Date 12/03/21      PT SHORT TERM GOAL #2   Title Pt able to demo proper standing alignment and dynamic squat technique with min or no pain    Time 2    Period Weeks    Status New    Target Date 12/03/21      PT SHORT  TERM GOAL #3   Title Pt will be able to demo proper dynamic squat and lift technique with proper technique and min/no pain    Time 3    Period Weeks    Status New    Target Date 12/10/21      PT SHORT TERM GOAL #4   Title Pt will improve hamstring length to at least 60 deg    Time 4    Period Weeks    Status New    Target Date 12/17/21      PT SHORT TERM GOAL #5   Title Pt will demo carry of up to 15lb box anteriorly and 10lb unilateral carry at side with good alignment and tolerance.  Time 4    Period Weeks    Status New    Target Date 12/17/21               PT Long Term Goals - 11/19/21 0950       PT LONG TERM GOAL #1   Title independent with advanced HEP    Time 8    Period Weeks    Status New    Target Date 01/14/22      PT LONG TERM GOAL #2   Title Pt will report improved pain with work shift demands as night nurse with pain not to exceed 5/10 most work shifts.    Baseline -    Time 8    Period Weeks    Status New    Target Date 01/14/22      PT LONG TERM GOAL #3   Title Pt will improve postural strength for upper quadrants, trunk/core, and hips/thighs to at least 4+/5 for improved work Forensic scientist.    Baseline strength ranges 3+/5 to 4/5    Time 8    Period Weeks    Status New    Target Date 01/14/22      PT LONG TERM GOAL #4   Title FOTO improved to 61% or greater to demo improved function.    Baseline 44% at eval    Time 8    Period Weeks    Status New    Target Date 01/14/22      PT LONG TERM GOAL #5   Title Pt will be able to perform standing and seated tasks for up to 1 hour with min or no pain for improved work and community tolerance.    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  Pain is worse with prolonged  sitting, standing > 20', and bend/lift/carry tasks at work.  Pt uses OTC meds, topical creams and heat for relief.  Bil plantar fascitis is present and limits walking for exercise.  Pt presents with abnormal posture, postural weakness, limited hip ROM and limited LE flexibility.  She has diffuse tenderness across bil upper quadrants, lumbar region and bil hips.  Hip FABER and FADIR are positive bil.  Pt has a history of pelvic floor dysfunction with ongoing challenge with continence and pubic bone pain.  PT educated and practiced finding proper standing alignment and introduced functional dynamic squat using hip hinge, maintained alignment and core activation.  Pt was able to perform without pain when performed with these cues.  Pt will benefit from body mechanics and postural/functoinal task strength training, LE stretching, and improved hip mobility to maximize her function with less pain.    Personal Factors and Comorbidities Time since onset of injury/illness/exacerbation;Fitness;Profession    Examination-Activity Limitations Locomotion Level;Bend;Sit;Squat;Stand;Lift;Carry    Examination-Participation Restrictions Occupation;Cleaning;Community Activity;Driving;Shop;Laundry    Stability/Clinical Decision Making Stable/Uncomplicated    Clinical Decision Making Low    Rehab Potential Good    PT Frequency 1x / week    PT Duration 8 weeks    PT Treatment/Interventions ADLs/Self Care Home Management;Electrical Stimulation;Moist Heat;Functional mobility training;Therapeutic activities;Therapeutic exercise;Neuromuscular re-education;Manual techniques;Patient/family education;Passive range of motion;Dry needling;Joint Manipulations;Spinal Manipulations;Taping  PT Next Visit Plan review HEP, progress scapular/UE strength, intro core and hip strength as tol, try DN to hips/lumbar and upper quadrants    PT Home Exercise Plan Access Code: [email or web address]    Consulted and Agree with Plan of Care Patient              Patient will benefit from skilled therapeutic intervention in order to improve the following deficits and impairments:  Abnormal gait, Pain, Postural dysfunction, Decreased strength, Decreased mobility, Impaired flexibility, Improper body mechanics, Decreased activity tolerance  Visit Diagnosis: Chronic bilateral low back pain without sciatica - Plan: PT plan of care cert/re-cert  Abnormal posture - Plan: PT plan of care cert/re-cert  Muscle weakness (generalized) - Plan: PT plan of care cert/re-cert  Cramp and spasm - Plan: PT plan of care cert/re-cert     Problem List Patient Active Problem List   Diagnosis Date Noted   Anxiety and depression 07/07/2019   Mild intermittent asthma without complication 07/07/2019   Seasonal allergies 07/07/2019   Plantar fasciitis 07/07/2019   Hyperhidrosis 07/07/2019   OAB (overactive bladder) 07/07/2019   Allergy 08/06/2018    Morton PetersJohanna Charly Holcomb, PT 11/19/21 10:12 AM    [facility] [address] [place], [place], [address] Phone: [phone]   Fax:  [phone]  Name: Efraim Kaufmannlana Dionne Twaddell MRN: [id number] Date of Birth: Feb 16, 1996

## 2021-11-29 ENCOUNTER — Ambulatory Visit: Payer: Commercial Managed Care - PPO | Attending: Family Medicine | Admitting: Physical Therapy

## 2021-11-29 ENCOUNTER — Other Ambulatory Visit: Payer: Self-pay

## 2021-11-29 DIAGNOSIS — M6281 Muscle weakness (generalized): Secondary | ICD-10-CM

## 2021-11-29 DIAGNOSIS — G8929 Other chronic pain: Secondary | ICD-10-CM | POA: Insufficient documentation

## 2021-11-29 DIAGNOSIS — M545 Low back pain, unspecified: Secondary | ICD-10-CM | POA: Diagnosis not present

## 2021-11-29 DIAGNOSIS — R293 Abnormal posture: Secondary | ICD-10-CM | POA: Diagnosis present

## 2021-11-29 DIAGNOSIS — R252 Cramp and spasm: Secondary | ICD-10-CM

## 2021-11-29 NOTE — Patient Instructions (Signed)
Access Code: [email or web address] URL: [email or web address] Date: 11/29/2021 Prepared by: Lavinia Sharps  Exercises Seated Hamstring Stretch - 2 x daily - 7 x weekly - 1 sets - 2 reps - 30 hold Seated Flexion Stretch - 2 x daily - 7 x weekly - 1 sets - 3 reps - 30 hold Child's Pose Stretch - 2 x daily - 7 x weekly - 1 sets - 3 reps - 10 hold Child's Pose with Sidebending - 2 x daily - 7 x weekly - 1 sets - 3 reps - 10 hold Supine Transversus Abdominis Bracing - Hands on Stomach - 1 x daily - 7 x weekly - 1 sets - 5 reps Hooklying Isometric Hip Flexion with Opposite Arm - 1 x daily - 7 x weekly - 1 sets - 5 reps Supine Bent Knee Foot Taps - 1 x daily - 7 x weekly - 1 sets - 5 reps Standing Quadratus Lumborum Stretch with Doorway - 1 x daily - 7 x weekly - 1 sets - 3 reps - 20 hold Seated Quadratus Lumborum Stretch in Chair - 1 x daily - 7 x weekly - 1 sets - 3 reps - 20 hold

## 2021-11-29 NOTE — Therapy (Signed)
[facility] @ [place], [place], [address] Phone: [phone]   Fax:  [phone]  Physical Therapy Treatment  Patient Details  Name: Brianna Barr MRN: [id number] Date of Birth: 04/05/1996 Referring Provider (PT): Billie Ruddy, MD   Encounter Date: 11/29/2021   PT End of Session - 11/29/21 0847     Visit Number 2    Date for PT Re-Evaluation 01/14/22    Authorization Type UMR/[employer], [facility] employee    Authorization Time Period through 10/26/22    Authorization - Visit Number 2    Authorization - Number of Visits 60    PT Start Time 0800    PT Stop Time 0845    PT Time Calculation (min) 45 min    Activity Tolerance Patient tolerated treatment well             Past Medical History:  Diagnosis Date   Anxiety    Asthma    Depression    Overactive bladder    Stress incontinence     Past Surgical History:  Procedure Laterality Date   WISDOM TOOTH EXTRACTION      There were no vitals filed for this visit.   Subjective Assessment - 11/29/21 0800     Subjective Pretty good.  Quadruped ex is harder on the knees.  Across LBP.  Tightness in ant hips.    Pertinent History PMH: bil plantar fasciitis, vit D deficiency, fatigue, obesity, anxiety, depression    Currently in Pain? Yes    Pain Score 4     Pain Location Back    Pain Orientation Right;Left    Pain Type Chronic pain                               [facility] Adult PT Treatment/Exercise - 11/29/21 0001       Lumbar Exercises: Stretches   Other Lumbar Stretch Exercise review of initial HEP childs pose, HS, seated flexion    Other Lumbar Stretch Exercise add seated and standing QL stretch      Lumbar Exercises: Seated   Other Seated Lumbar Exercises isometric UE push down to activate transverse abdominus muscles 8x      Lumbar Exercises: Supine   Ab Set 5 reps    Bent Knee Raise 5 reps    Isometric Hip Flexion 5 reps       Electrical Stimulation   Electrical Stimulation Location bil lumbar multifidi    Electrical Stimulation Action 1.5 ma bil with DN    Electrical Stimulation Parameters 8 min    Electrical Stimulation Goals Pain      Manual Therapy   Soft tissue mobilization bil lumbar paraspinals and QL              Trigger Point Dry Needling - 11/29/21 0001     Consent Given? Yes    Education Handout Provided Previously provided    Muscles Treated Back/Hip Lumbar multifidi    Electrical Stimulation Performed with Dry Needling Yes    Lumbar multifidi Response Palpable increased muscle length                   PT Education - 11/29/21 0846     Education Details abdominal brace series; standing and seated QL stretch;  verbal review of DN (had handout in past from this facility)    Person(s) Educated Patient    Methods Explanation;Demonstration;Handout  Comprehension Returned demonstration;Verbalized understanding              PT Short Term Goals - 11/19/21 0948       PT SHORT TERM GOAL #1   Title Pt will be ind with initial HEP    Time 2    Period Weeks    Status New    Target Date 12/03/21      PT SHORT TERM GOAL #2   Title Pt able to demo proper standing alignment and dynamic squat technique with min or no pain    Time 2    Period Weeks    Status New    Target Date 12/03/21      PT SHORT TERM GOAL #3   Title Pt will be able to demo proper dynamic squat and lift technique with proper technique and min/no pain    Time 3    Period Weeks    Status New    Target Date 12/10/21      PT SHORT TERM GOAL #4   Title Pt will improve hamstring length to at least 60 deg    Time 4    Period Weeks    Status New    Target Date 12/17/21      PT SHORT TERM GOAL #5   Title Pt will demo carry of up to 15lb box anteriorly and 10lb unilateral carry at side with good alignment and tolerance.    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    Baseline -    Time 8    Period Weeks    Status New    Target Date 01/14/22      PT LONG TERM GOAL #3   Title Pt will improve postural strength for upper quadrants, trunk/core, and hips/thighs to at least 4+/5 for improved work Public relations account executive.    Baseline strength ranges 3+/5 to 4/5    Time 8    Period Weeks    Status New    Target Date 01/14/22      PT LONG TERM GOAL #4   Title FOTO improved to 61% or greater to demo improved function.    Baseline 44% at eval    Time 8    Period Weeks    Status New    Target Date 01/14/22      PT LONG TERM GOAL #5   Title Pt will be able to perform standing and seated tasks for up to 1 hour with min or no pain for improved work and community tolerance.    Time 8    Period Weeks    Status New    Target Date 01/14/22                   Plan - 11/29/21 0756     Clinical Impression Statement The patient is able to activate transverse abdominus muscles  quickly in supine with minimal cues and perform isometric hip flexion easily as well.  Bent knee lowering is much more challenging.  She has had DN in the past to lumbar region and is receptive to doing again with the addition of DN with ES.  Much improved soft tissue mobility noted afterwards.  Added QL stretches in sitting and standing to HEP which could easily be done at work.  Therapist monitoring response with all interventions.    Personal Factors and Comorbidities Time since onset of injury/illness/exacerbation;Fitness;Profession    Examination-Activity Limitations Locomotion Level;Bend;Sit;Squat;Stand;Lift;Carry    Examination-Participation Restrictions  Occupation;Cleaning;Community Activity;Driving;Shop;Laundry    Stability/Clinical Decision Making Stable/Uncomplicated    Rehab Potential Good    PT Frequency 1x / week    PT Duration 8 weeks    PT Treatment/Interventions ADLs/Self Care Home Management;Electrical Stimulation;Moist Heat;Functional mobility training;Therapeutic activities;Therapeutic exercise;Neuromuscular re-education;Manual techniques;Patient/family education;Passive range of motion;Dry needling;Joint Manipulations;Spinal Manipulations;Taping    PT Next Visit Plan assess response to DN#1 with ES to multifidi;  may add DN to QL next time; review QL stretches as needed; progress abdominal brace to standing; check glute med strength    PT Home Exercise Plan Access Code: [email or web address]             Patient will benefit from skilled therapeutic intervention in order to improve the following deficits and impairments:  Abnormal gait, Pain, Postural dysfunction, Decreased strength, Decreased mobility, Impaired flexibility, Improper body mechanics, Decreased activity tolerance  Visit Diagnosis: Chronic bilateral low back pain without sciatica  Abnormal posture  Muscle weakness (generalized)  Cramp and spasm     Problem List Patient Active Problem List   Diagnosis Date Noted   Anxiety and depression 07/07/2019   Mild intermittent asthma without complication [id number]   Seasonal allergies 07/07/2019   Plantar fasciitis 07/07/2019   Hyperhidrosis 07/07/2019   OAB (overactive bladder) 07/07/2019   Allergy 08/06/2018   Ruben Im, PT 11/29/21 9:01 AM Phone: [phone] Fax: [phone]  Alvera Singh, PT 11/29/2021, 9:00 AM  [facility] @ [place], [place], [address] Phone: [phone]   Fax:  [phone]  Name: Shataria Kroon MRN: [id number] Date of Birth: 07-Feb-1996

## 2021-12-04 ENCOUNTER — Ambulatory Visit (INDEPENDENT_AMBULATORY_CARE_PROVIDER_SITE_OTHER): Payer: Commercial Managed Care - PPO | Admitting: Psychiatry

## 2021-12-04 ENCOUNTER — Other Ambulatory Visit: Payer: Self-pay

## 2021-12-04 DIAGNOSIS — F411 Generalized anxiety disorder: Secondary | ICD-10-CM | POA: Diagnosis not present

## 2021-12-04 NOTE — Progress Notes (Signed)
[facility] Counselor/Therapist Progress Note  Patient ID: Brianna Barr, MRN: [id number],    Date: 12/04/2021  Time Spent: 55 minutes   Treatment Type: Individual Therapy  Reported Symptoms: anxiety, depression, family "concerns"  Mental Status Exam:  Appearance:   Casual     Behavior:  Appropriate, Sharing, and Motivated  Motor:  Normal  Speech/Language:   Normal  Affect:  Anxious  Mood:  Anxious  Thought process:  goal directed  Thought content:    Some overthinking  Sensory/Perceptual disturbances:    WNL  Orientation:  oriented to person, place, time/date, situation, day of week, month of year, year, and stated date of Feb. 8, 2023  Attention:  Good  Concentration:  Good  Memory:  WNL  Fund of knowledge:   Good  Insight:    Good  Judgment:   Good  Impulse Control:  Good   Risk Assessment: Danger to Self:  No Self-injurious Behavior: No Danger to Others: No Duty to Warn:no Physical Aggression / Violence:No  Access to Firearms a concern: No  Gang Involvement:No   Subjective: Patient in today reporting anxiety and depression with anxiety being the stronger symptom.  States her anxiety and depression are mostly related to personal, family, parents,work, and other relationships. Focused more today on personal, family, and issues with parents.  Mom particularly can come on strong (and be very strong-willed when communicating with patient, including what she feels patient should do or not do as an adult child. Discussed good communication skills especially when differing opinions exist with parent; communication skills that include talking and listening. Reviewed active listening skills to really hear the other person before responding, and also paying attention to "how I say what I say." Struggling with relationship with mom (who is very loud and very vocal) and her putting pressure on patient. Processed patient's struggle with her own decision-making and not feeling  that she needs to please mom on everything. Tearful and yet "relieving" to discuss today, and also talked through some options for better boundaries with mom that would feel healthier for patient.   Interventions: Solution-Oriented/Positive Psychology and Insight-Oriented  Treatment goal plan:   Patient not signing tx plan on computer screen due to [place]. Treatment Goals: Goals will remain on tx plan as patient works with strategies to meet her goals.  Progress will be noted each session and documented in "Progress" section of Plan. Long term goal: Reduce overall level, frequency, and intensity of the anxiety so that daily functioning is not impaired. Short term goal: Increase understanding of beliefs and messages that produce anxiety, depression, or "worries". Strategy: Identify, challenge, and replace anxious/depressive/fearful self-talk with positive, realistic, and empowering self-talk.  Diagnosis:   ICD-10-CM   1. Generalized anxiety disorder  F41.1      Plan: Today showing motivation and good participation in session working on her anxiety and depression especially in her challenging relationship with demanding parent.  To work between sessions on using some of the techniques we discussed around the issues of communication especially with her mother where there are challenges.  (Not all details included in this note due to patient privacy needs.)  Patient did seem more calm, grounded, and confident by the end of session.  Review of some of her goal-directed behaviors that can help her with her anxiety, depression, and more positive self talk and self coaching especially in challenging circumstances within the family.  To be more boundary conscious within the family and especially in terms  of having healthier boundaries. Encouraged patient in her practice of more positive behaviors including: Recognizing and reflecting more often on her progress, for every negative thought replace with 2  positives, believing in herself more in her ability to make significant changes, refraining from taking on extra work shifts, being more open to meeting new friends when she has the opportunity, consistent positive self talk, staying in touch with her small core group of friends, interrupting anxious/negative/depressive thoughts and challenging them to replace with more realistic and empowering thoughts that do not feed her anxiety nor depression, identifying herself in more positive ways, staying in the present focusing on what she can change, saying no when she needs to say no without guilt, allow for good sleep patterns, keeping her expectations realistic, staying in contact with supportive people, setting and keeping healthy boundaries, refraining from over personalizing the comments of other people including her family, taking breaks as she can throughout the day, and feel good about the strength she shows working with goal-directed behaviors to move in a direction that supports improved emotional health.  Goal review and progress/challenges noted with patient.  Next appointment within 2 weeks.  This record has been created using [employer].  Chart creation errors have been sought, but may not always have been located and corrected.  Such creation errors do not reflect on the standard of medical care provided.   Shanon Ace, LCSW

## 2021-12-06 ENCOUNTER — Other Ambulatory Visit: Payer: Self-pay

## 2021-12-06 ENCOUNTER — Ambulatory Visit: Payer: Commercial Managed Care - PPO | Admitting: Physical Therapy

## 2021-12-06 DIAGNOSIS — M545 Low back pain, unspecified: Secondary | ICD-10-CM | POA: Diagnosis not present

## 2021-12-06 DIAGNOSIS — G8929 Other chronic pain: Secondary | ICD-10-CM

## 2021-12-06 DIAGNOSIS — M6281 Muscle weakness (generalized): Secondary | ICD-10-CM

## 2021-12-06 DIAGNOSIS — R293 Abnormal posture: Secondary | ICD-10-CM

## 2021-12-06 NOTE — Therapy (Signed)
[facility] @ [facility] [address] [place], [place], [address] Phone: [phone]   Fax:  [phone]  Physical Therapy Treatment  Patient Details  Name: Brianna Barr MRN: [id number] Date of Birth: 1996-06-13 Referring Provider (PT): Deeann Saint, MD   Encounter Date: 12/06/2021   PT End of Session - 12/06/21 0844     Visit Number 3    Date for PT Re-Evaluation 01/14/22    Authorization Type [employer], [facility] employee    Authorization Time Period through 10/26/22    Authorization - Visit Number 3    Authorization - Number of Visits 60    PT Start Time 0845    PT Stop Time 0926    PT Time Calculation (min) 41 min    Activity Tolerance Patient tolerated treatment well             Past Medical History:  Diagnosis Date   Anxiety    Asthma    Depression    Overactive bladder    Stress incontinence     Past Surgical History:  Procedure Laterality Date   WISDOM TOOTH EXTRACTION      There were no vitals filed for this visit.   Subjective Assessment - 12/06/21 0844     Subjective It's been a busy work week.  It hurts a lot this morning.  Low back and up to shoulders and neck.    Pertinent History PMH: bil plantar fasciitis, vit D deficiency, fatigue, obesity, anxiety, depression    How long can you sit comfortably? 45-60 min (LBP)    How long can you walk comfortably? can improve LBP but bil feet pain limits walking    Patient Stated Goals less pain    Currently in Pain? Yes    Pain Score 6     Pain Location Back    Pain Orientation Right;Left   left is worse                              [facility] Adult PT Treatment/Exercise - 12/06/21 0001       Electrical Stimulation   Electrical Stimulation Location bil lumbar multifidi    Electrical Stimulation Action 1.8 ma bil with DN    Electrical Stimulation Parameters 8 min    Electrical Stimulation Goals Pain      Manual Therapy   Soft  tissue mobilization bil lumbar paraspinals; left gluteal, left upper trap              Trigger Point Dry Needling - 12/06/21 0001     Consent Given? Yes    Education Handout Provided Previously provided    Muscles Treated Head and Neck Upper trapezius    Muscles Treated Back/Hip Lumbar multifidi;Gluteus maximus    Electrical Stimulation Performed with Dry Needling Yes    Upper Trapezius Response Twitch reponse elicited;Palpable increased muscle length   left   Gluteus Maximus Response Palpable increased muscle length;Twitch response elicited   left   Lumbar multifidi Response Palpable increased muscle length                   PT Education - 12/06/21 0918     Education Details cat cow variations;  counter stretch    Person(s) Educated Patient    Methods Explanation;Demonstration;Handout    Comprehension Returned demonstration;Verbalized understanding              PT Short Term Goals -  11/19/21 0948       PT SHORT TERM GOAL #1   Title Pt will be ind with initial HEP    Time 2    Period Weeks    Status New    Target Date 12/03/21      PT SHORT TERM GOAL #2   Title Pt able to demo proper standing alignment and dynamic squat technique with min or no pain    Time 2    Period Weeks    Status New    Target Date 12/03/21      PT SHORT TERM GOAL #3   Title Pt will be able to demo proper dynamic squat and lift technique with proper technique and min/no pain    Time 3    Period Weeks    Status New    Target Date 12/10/21      PT SHORT TERM GOAL #4   Title Pt will improve hamstring length to at least 60 deg    Time 4    Period Weeks    Status New    Target Date 12/17/21      PT SHORT TERM GOAL #5   Title Pt will demo carry of up to 15lb box anteriorly and 10lb unilateral carry at side with good alignment and tolerance.    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    Baseline -    Time 8    Period Weeks    Status New    Target Date 01/14/22      PT LONG TERM GOAL #3   Title Pt will improve postural strength for upper quadrants, trunk/core, and hips/thighs to at least 4+/5 for improved work Forensic scientist.    Baseline strength ranges 3+/5 to 4/5    Time 8    Period Weeks    Status New    Target Date 01/14/22      PT LONG TERM GOAL #4   Title FOTO improved to 61% or greater to demo improved function.    Baseline 44% at eval    Time 8    Period Weeks    Status New    Target Date 01/14/22      PT LONG TERM GOAL #5   Title Pt will be able to perform standing and seated tasks for up to 1 hour with min or no pain for improved work and community tolerance.    Time 8    Period Weeks    Status New    Target Date 01/14/22                   Plan - 12/06/21 0919     Clinical Impression Statement The patient reports moderate 6/10 back pain today which has spread all the up to her neck.  She had a good initial response to DN combined with ES and is  receptive to doing this again.  Instructed on lumbar mobility/stretches and modifications for work to promoted soft tissue healing.  Therapist monitoring response with all treatment interventions.    Personal Factors and Comorbidities Time since onset of injury/illness/exacerbation;Fitness;Profession    Examination-Activity Limitations Locomotion Level;Bend;Sit;Squat;Stand;Lift;Carry    Examination-Participation Restrictions Occupation;Cleaning;Community Activity;Driving;Shop;Laundry    Rehab Potential Good    PT Frequency 1x / week    PT Duration 8 weeks    PT Treatment/Interventions ADLs/Self Care Home  Management;Electrical Stimulation;Moist Heat;Functional mobility training;Therapeutic activities;Therapeutic exercise;Neuromuscular re-education;Manual techniques;Patient/family education;Passive range of motion;Dry needling;Joint Manipulations;Spinal Manipulations;Taping    PT Next Visit Plan assess response to DN#2 with ES to multifidi, DN to left glute;  may add DN to QL next time; review QL stretches as needed; progress abdominal brace to standing; check glute med strength             Patient will benefit from skilled therapeutic intervention in order to improve the following deficits and impairments:  Abnormal gait, Pain, Postural dysfunction, Decreased strength, Decreased mobility, Impaired flexibility, Improper body mechanics, Decreased activity tolerance  Visit Diagnosis: Chronic bilateral low back pain without sciatica  Abnormal posture  Muscle weakness (generalized)     Problem List Patient Active Problem List   Diagnosis Date Noted   Anxiety and depression 07/07/2019   Mild intermittent asthma without complication 07/07/2019   Seasonal allergies 07/07/2019   Plantar fasciitis 07/07/2019   Hyperhidrosis 07/07/2019   OAB (overactive bladder) 07/07/2019   Allergy 08/06/2018   Lavinia Sharps, PT 12/06/21 12:22 PM Phone: [phone] Fax: [phone]  Vivien Presto, PT 12/06/2021, 12:21 PM  [facility] @ [facility] [address] [place], [place], [address] Phone: [phone]   Fax:  [phone]  Name: Brianna Barr MRN: [id number] Date of Birth: 1996-10-18

## 2021-12-06 NOTE — Patient Instructions (Signed)
Access Code: [email or web address] URL: [email or web address] Date: 12/06/2021 Prepared by: Lavinia Sharps  Exercises Seated Hamstring Stretch - 2 x daily - 7 x weekly - 1 sets - 2 reps - 30 hold Seated Flexion Stretch - 2 x daily - 7 x weekly - 1 sets - 3 reps - 30 hold Child's Pose Stretch - 2 x daily - 7 x weekly - 1 sets - 3 reps - 10 hold Child's Pose with Sidebending - 2 x daily - 7 x weekly - 1 sets - 3 reps - 10 hold Supine Transversus Abdominis Bracing - Hands on Stomach - 1 x daily - 7 x weekly - 1 sets - 5 reps Hooklying Isometric Hip Flexion with Opposite Arm - 1 x daily - 7 x weekly - 1 sets - 5 reps Supine Bent Knee Foot Taps - 1 x daily - 7 x weekly - 1 sets - 5 reps Standing Quadratus Lumborum Stretch with Doorway - 1 x daily - 7 x weekly - 1 sets - 3 reps - 20 hold Seated Quadratus Lumborum Stretch in Chair - 1 x daily - 7 x weekly - 1 sets - 3 reps - 20 hold Standing Lumbar Spine Flexion Stretch Counter - 1 x daily - 7 x weekly - 1 sets - 10 reps Cat Cow - 1 x daily - 7 x weekly - 1 sets - 10 reps Modified Cat Cow at Wall - 1 x daily - 7 x weekly - 1 sets - 10 reps Modified Cat Cow at Chair - 1 x daily - 7 x weekly - 1 sets - 10 reps

## 2021-12-10 ENCOUNTER — Other Ambulatory Visit: Payer: Self-pay

## 2021-12-10 ENCOUNTER — Ambulatory Visit: Payer: Commercial Managed Care - PPO | Admitting: Physical Therapy

## 2021-12-10 DIAGNOSIS — M6281 Muscle weakness (generalized): Secondary | ICD-10-CM

## 2021-12-10 DIAGNOSIS — M545 Low back pain, unspecified: Secondary | ICD-10-CM | POA: Diagnosis not present

## 2021-12-10 DIAGNOSIS — R293 Abnormal posture: Secondary | ICD-10-CM

## 2021-12-10 NOTE — Therapy (Signed)
[facility] @ [facility] [place], [place], [address] Phone: [phone]   Fax:  [phone]  Physical Therapy Treatment  Patient Details  Name: Brianna Barr MRN: [id number] Date of Birth: 11/05/1995 Referring Provider (PT): Billie Ruddy, MD   Encounter Date: 12/10/2021   PT End of Session - 12/10/21 1812     Visit Number 4    Date for PT Re-Evaluation 01/14/22    Authorization Type [employer], [facility] employee    Authorization Time Period through 10/26/22    Authorization - Visit Number 4    Authorization - Number of Visits 60    PT Start Time 0845    PT Stop Time 0930    PT Time Calculation (min) 45 min    Activity Tolerance Patient tolerated treatment well             Past Medical History:  Diagnosis Date   Anxiety    Asthma    Depression    Overactive bladder    Stress incontinence     Past Surgical History:  Procedure Laterality Date   WISDOM TOOTH EXTRACTION      There were no vitals filed for this visit.   Subjective Assessment - 12/10/21 0851     Subjective I'm feeling OK.  Pain across low back.  Standing at work is still problematic.  I think the DN is helping.  I think lifting would cause a delayed response especially at the end of the day.    How long can you stand comfortably? 30 min    Currently in Pain? Yes    Pain Score 3     Pain Location Back    Pain Orientation Right;Left                [facility] PT Assessment - 12/10/21 0001       AROM   Cervical - Right Side Bend 45    Cervical - Left Side Bend 45    Lumbar Flexion fingers 2 inches from toes    Lumbar Extension full with pain    Lumbar - Right Side Bend full    Lumbar - Left Side Bend full      Flexibility   Hamstrings 70 bil    Piriformis limited 25%                           [facility] Adult PT Treatment/Exercise - 12/10/21 0001       Lumbar Exercises: Standing   Other Standing Lumbar Exercises  hip hinging with golf club 3 points of contact 10x    Other Standing Lumbar Exercises hip hinge/squat 10x and how to use functionally      Electrical Stimulation   Electrical Stimulation Location bil lumbar multifidi    Electrical Stimulation Action 2.0 ma bil with DN    Electrical Stimulation Parameters 8 min    Electrical Stimulation Goals Pain      Manual Therapy   Soft tissue mobilization bil lumbar paraspinals; bil glutes              Trigger Point Dry Needling - 12/10/21 0001     Consent Given? Yes    Education Handout Provided Previously provided    Muscles Treated Back/Hip Lumbar multifidi    Electrical Stimulation Performed with Dry Needling Yes    E-stim with Dry Needling Details bil    Gluteus Maximus Response Palpable increased muscle length  Lumbar multifidi Response Palpable increased muscle length                   PT Education - 12/10/21 0926     Education Details hip hinge    Person(s) Educated Patient    Methods Explanation;Demonstration;Handout    Comprehension Returned demonstration;Verbalized understanding              PT Short Term Goals - 12/10/21 1819       PT SHORT TERM GOAL #1   Title Pt will be ind with initial HEP    Status Achieved      PT SHORT TERM GOAL #2   Title Pt able to demo proper standing alignment and dynamic squat technique with min or no pain    Status Achieved      PT SHORT TERM GOAL #3   Title Pt will be able to demo proper dynamic squat and lift technique with proper technique and min/no pain    Time 3    Period Weeks    Status On-going      PT SHORT TERM GOAL #4   Title Pt will improve hamstring length to at least 60 deg    Status Achieved      PT SHORT TERM GOAL #5   Title Pt will demo carry of up to 15lb box anteriorly and 10lb unilateral carry at side with good alignment and tolerance.    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    Baseline -    Time 8    Period Weeks    Status New    Target Date 01/14/22      PT LONG TERM GOAL #3   Title Pt will improve postural strength for upper quadrants, trunk/core, and hips/thighs to at least 4+/5 for improved work Public relations account executive.    Baseline strength ranges 3+/5 to 4/5    Time 8    Period Weeks    Status New    Target Date 01/14/22      PT LONG TERM GOAL #4   Title FOTO improved to 61% or greater to demo improved function.    Baseline 44% at eval    Time 8    Period Weeks    Status New    Target Date 01/14/22      PT LONG TERM GOAL #5   Title Pt will be able to perform standing and seated tasks for up to 1 hour with min or no pain for improved work and community tolerance.    Time 8    Period Weeks    Status New    Target Date 01/14/22                   Plan - 12/10/21 1815     Clinical Impression Statement The patient rates her overall improvement at 30%.  Discussed hip hinging and how to use functionally  to limit end of the day fatigue with bending/lifting after a work shift.  Much improved lumbar ROM, HS length and cervical sidebending ROM.  Will check lifting STGs after initial practice with hip hinging for 1 week.    Personal Factors and Comorbidities Time since onset of injury/illness/exacerbation;Fitness;Profession    Examination-Activity Limitations Locomotion Level;Bend;Sit;Squat;Stand;Lift;Carry    Examination-Participation Restrictions Occupation;Cleaning;Community Activity;Driving;Shop;Laundry    Rehab Potential Good    PT Frequency 1x / week    PT Duration 8 weeks    PT Treatment/Interventions ADLs/Self Care Home Management;Electrical Stimulation;Moist  Heat;Functional mobility training;Therapeutic activities;Therapeutic exercise;Neuromuscular re-education;Manual techniques;Patient/family education;Passive range of motion;Dry needling;Joint Manipulations;Spinal Manipulations;Taping    PT Next Visit Plan DN with ES;  review hip hinge and progress to dead lifting;  glute med strengthening    PT Home Exercise Plan Access Code: [email or web address]             Patient will benefit from skilled therapeutic intervention in order to improve the following deficits and impairments:  Abnormal gait, Pain, Postural dysfunction, Decreased strength, Decreased mobility, Impaired flexibility, Improper body mechanics, Decreased activity tolerance  Visit Diagnosis: Chronic bilateral low back pain without sciatica  Abnormal posture  Muscle weakness (generalized)     Problem List Patient Active Problem List   Diagnosis Date Noted   Anxiety and depression 07/07/2019   Mild intermittent asthma without complication [id number]   Seasonal allergies 07/07/2019   Plantar fasciitis 07/07/2019   Hyperhidrosis 07/07/2019   OAB (overactive bladder) 07/07/2019   Allergy 08/06/2018   Ruben Im, PT 12/10/21 6:20 PM Phone: [phone] Fax: [phone]  Alvera Singh, PT 12/10/2021, 6:20 PM  [facility] @ [facility] [place], [place], [address] Phone: [phone]   Fax:  [phone]  Name: Brianna Barr MRN: [id number] Date of Birth: 08/12/1996

## 2021-12-10 NOTE — Patient Instructions (Signed)
Access Code: [email or web address] URL: [email or web address] Date: 12/10/2021 Prepared by: Lavinia Sharps  Exercises Seated Hamstring Stretch - 2 x daily - 7 x weekly - 1 sets - 2 reps - 30 hold Seated Flexion Stretch - 2 x daily - 7 x weekly - 1 sets - 3 reps - 30 hold Child's Pose Stretch - 2 x daily - 7 x weekly - 1 sets - 3 reps - 10 hold Child's Pose with Sidebending - 2 x daily - 7 x weekly - 1 sets - 3 reps - 10 hold Supine Transversus Abdominis Bracing - Hands on Stomach - 1 x daily - 7 x weekly - 1 sets - 5 reps Hooklying Isometric Hip Flexion with Opposite Arm - 1 x daily - 7 x weekly - 1 sets - 5 reps Supine Bent Knee Foot Taps - 1 x daily - 7 x weekly - 1 sets - 5 reps Standing Quadratus Lumborum Stretch with Doorway - 1 x daily - 7 x weekly - 1 sets - 3 reps - 20 hold Seated Quadratus Lumborum Stretch in Chair - 1 x daily - 7 x weekly - 1 sets - 3 reps - 20 hold Standing Lumbar Spine Flexion Stretch Counter - 1 x daily - 7 x weekly - 1 sets - 10 reps Cat Cow - 1 x daily - 7 x weekly - 1 sets - 10 reps Modified Cat Cow at Wall - 1 x daily - 7 x weekly - 1 sets - 10 reps Modified Cat Cow at Chair - 1 x daily - 7 x weekly - 1 sets - 10 reps Standing Hip Hinge with Dowel - 1 x daily - 7 x weekly - 1 sets - 10 reps Standing Hip Hinge - 1 x daily - 7 x weekly - 1 sets - 10 reps

## 2021-12-13 ENCOUNTER — Ambulatory Visit: Payer: Commercial Managed Care - PPO | Admitting: Family Medicine

## 2021-12-18 ENCOUNTER — Ambulatory Visit: Payer: 59 | Admitting: Psychiatry

## 2021-12-19 ENCOUNTER — Other Ambulatory Visit: Payer: Self-pay | Admitting: Family Medicine

## 2021-12-19 ENCOUNTER — Ambulatory Visit: Payer: Commercial Managed Care - PPO | Admitting: Family Medicine

## 2021-12-19 ENCOUNTER — Ambulatory Visit: Payer: Commercial Managed Care - PPO | Admitting: Physical Therapy

## 2021-12-19 ENCOUNTER — Other Ambulatory Visit: Payer: Self-pay

## 2021-12-19 VITALS — BP 118/82 | HR 81 | Temp 99.0°F | Ht 62.0 in | Wt 231.0 lb

## 2021-12-19 DIAGNOSIS — M6281 Muscle weakness (generalized): Secondary | ICD-10-CM

## 2021-12-19 DIAGNOSIS — G8929 Other chronic pain: Secondary | ICD-10-CM

## 2021-12-19 DIAGNOSIS — M545 Low back pain, unspecified: Secondary | ICD-10-CM

## 2021-12-19 DIAGNOSIS — J302 Other seasonal allergic rhinitis: Secondary | ICD-10-CM

## 2021-12-19 DIAGNOSIS — F411 Generalized anxiety disorder: Secondary | ICD-10-CM | POA: Diagnosis not present

## 2021-12-19 DIAGNOSIS — R293 Abnormal posture: Secondary | ICD-10-CM

## 2021-12-19 DIAGNOSIS — M549 Dorsalgia, unspecified: Secondary | ICD-10-CM

## 2021-12-19 MED ORDER — MONTELUKAST SODIUM 10 MG PO TABS: 10.0000 mg | ORAL_TABLET | Freq: Every day | ORAL | 0 refills | Status: DC

## 2021-12-19 MED ORDER — CYCLOBENZAPRINE HCL 5 MG PO TABS: 5.0000 mg | ORAL_TABLET | Freq: Every evening | ORAL | 1 refills | Status: DC | PRN

## 2021-12-19 NOTE — Progress Notes (Signed)
Subjective:    Patient ID: Brianna Barr, female    DOB: [birth year], 26 y.o.   MRN: [id number]  Chief Complaint  Patient presents with   Follow-up    HPI Patient was seen today for f/u.  Pt in PT for low back pain worse at the end of the shift.  Endorses slight improvement in pain especially on days off.  Working on posture, trying to relax.  Advised was hyperextending her back.  Also with upper/shoulder pain.  Tries to wear supportive bras but finds it difficult 2/2 chest size.  Wearing mostly sports bras.  Endorses indentions in bilateral shoulders and bra straps.  Patient continuing follow-up with psychiatry.  States working to adjust medications.  Taking Wellbutrin XL 450 mg and Lexapro 10 mg.  Notes some improvement in depression symptoms with the change in weather.  Taking time to get out and enjoy the sun.  No recent follow-up with urogyn as provider was on maternity leave.  Patient states pelvic floor PT helped  but only lasted 2 months.  Next step was Botox injections however insurance denied coverage.  Past Medical History:  Diagnosis Date   Anxiety    Asthma    Depression    Overactive bladder    Stress incontinence     No Known Allergies  ROS General: Denies fever, chills, night sweats, changes in weight, changes in appetite  HEENT: Denies headaches, ear pain, changes in vision, rhinorrhea, sore throat CV: Denies CP, palpitations, SOB, orthopnea Pulm: Denies SOB, cough, wheezing GI: Denies abdominal pain, nausea, vomiting, diarrhea, constipation GU: Denies dysuria, hematuria, frequency, vaginal discharge Msk: Denies muscle cramps, joint pains + low back and upper back pain, pelvic floor pain Neuro: Denies weakness, numbness, tingling Skin: Denies rashes, bruising Psych: Denies hallucinations + Brianna Barr, depression     Objective:    Blood pressure 118/82, pulse 81, temperature 99 F (37.2 C), temperature source Oral, height 5\' 2"  (1.575 m), weight 231 lb (104.8  kg), SpO2 99 %.  Gen. Pleasant, well-nourished, in no distress, normal affect   HEENT: [place]/AT, face symmetric, conjunctiva clear, no scleral icterus, PERRLA, EOMI, nares patent without drainage Lungs: no accessory muscle use, CTAB, no wheezes or rales Cardiovascular: RRR, no m/r/g, no peripheral edema Musculoskeletal: TTP of bilateral shoulders, thoracic spine midline, lumbar spine midline and lumbar paraspinal muscles.  No deformities, no cyanosis or clubbing, normal tone Neuro:  A&Ox3, CN II-XII intact, normal gait Skin:  Warm, no lesions/ rash   Wt Readings from Last 3 Encounters:  12/19/21 231 lb (104.8 kg)  11/13/21 231 lb (104.8 kg)  04/04/21 229 lb (103.9 kg)    Lab Results  Component Value Date   WBC 11.1 (H) 11/13/2021   HGB 12.8 11/13/2021   HCT 39.6 11/13/2021   PLT 336.0 11/13/2021   GLUCOSE 113 (H) 11/13/2021   ALT 10 04/11/2021   AST 9 04/11/2021   NA 137 11/13/2021   K 4.2 11/13/2021   CL 103 11/13/2021   CREATININE 0.81 11/13/2021   BUN 13 11/13/2021   CO2 27 11/13/2021   TSH 4.12 11/13/2021   HGBA1C 5.5 11/13/2021    Assessment/Plan:  Chronic bilateral low back pain without sciatica -continue PT and home exercises -continue wearing supportive shoes at work and other ergonomic work space modifications. -will start flexeril 5 mg qhs prn  - Plan: cyclobenzaprine (FLEXERIL) 5 MG tablet  Chronic upper back pain  -Continue physical therapy and exercises at home to strengthen upper back. -Consider obtaining supportive  bras as bust size likely also contributing to back pain -Flexeril 5 mg nightly as needed - Plan: cyclobenzaprine (FLEXERIL) 5 MG tablet  GAD (generalized anxiety disorder) -Continue follow-up with psychiatry/counseling -Continue current medications including Wellbutrin XL 450 mg and Lexapro 10 mg daily -Continue self-care  F/u as needed  Grier Mitts, MD

## 2021-12-19 NOTE — Therapy (Signed)
[facility] [address] [place], [place], [address] Phone: [phone]   Fax:  [phone]  Physical Therapy Treatment  Patient Details  Name: Brianna Barr MRN: [id number] Date of Birth: July 18, 1996 Referring Provider (Brianna Barr): Deeann Saint, MD   Encounter Date: 12/19/2021   Brianna Barr End of Session - 12/19/21 1222     Visit Number 5    Date for Brianna Barr Re-Evaluation 01/14/22    Authorization Type U[employer], [facility] employee    Authorization Time Period through 10/26/22    Authorization - Visit Number 5    Authorization - Number of Visits 60    Brianna Barr Start Time 1145    Brianna Barr Stop Time 1225    Brianna Barr Time Calculation (min) 40 min    Activity Tolerance Patient tolerated treatment well             Past Medical History:  Diagnosis Date   Anxiety    Asthma    Depression    Overactive bladder    Stress incontinence     Past Surgical History:  Procedure Laterality Date   WISDOM TOOTH EXTRACTION      There were no vitals filed for this visit.   Subjective Assessment - 12/19/21 1149     Subjective 2-3/10 pain today.  I've been in the OR recently and lots of standing and that's OK.  After a little while it doesn't feel great.   Low back, hips and thighs. I think the DN/ES.    Pertinent History PMH: bil plantar fasciitis, vit D deficiency, fatigue, obesity, anxiety, depression    Currently in Pain? Yes    Pain Score 3     Pain Location Back    Pain Type Chronic pain                               [facility] Adult Brianna Barr Treatment/Exercise - 12/19/21 0001       Lumbar Exercises: Standing   Row Strengthening;Both;15 reps;Theraband    Theraband Level (Row) Level 3 (Green)    Shoulder Extension Strengthening;Both;15 reps;Theraband    Theraband Level (Shoulder Extension) Level 3 (Green)    Other Standing Lumbar Exercises SLS with diagonal extension green band 15x right/left    Other Standing Lumbar Exercises review of  hip hinge technique      Electrical Stimulation   Electrical Stimulation Location bil lumbar multifidi    Electrical Stimulation Action 2.5 MA BIL with Dn    Electrical Stimulation Parameters 8 min    Electrical Stimulation Goals Pain      Manual Therapy   Soft tissue mobilization bil lumbar paraspinals; bil glutes              Trigger Point Dry Needling - 12/19/21 0001     Consent Given? Yes    Education Handout Provided Previously provided    Muscles Treated Back/Hip Lumbar multifidi    Electrical Stimulation Performed with Dry Needling Yes    E-stim with Dry Needling Details bil    Gluteus Maximus Response Palpable increased muscle length    Lumbar multifidi Response Palpable increased muscle length                   Brianna Barr Education - 12/19/21 1219     Education Details BAND ROWS, EXTENSIONS, DIAGONALS SLS    Person(s) Educated Patient    Methods Explanation;Demonstration;Handout    Comprehension Verbalized understanding;Returned demonstration  Brianna Barr Short Term Goals - 12/10/21 1819       Brianna Barr SHORT TERM GOAL #1   Title Brianna Barr will be ind with initial HEP    Status Achieved      Brianna Barr SHORT TERM GOAL #2   Title Brianna Barr able to demo proper standing alignment and dynamic squat technique with min or no pain    Status Achieved      Brianna Barr SHORT TERM GOAL #3   Title Brianna Barr will be able to demo proper dynamic squat and lift technique with proper technique and min/no pain    Time 3    Period Weeks    Status On-going      Brianna Barr SHORT TERM GOAL #4   Title Brianna Barr will improve hamstring length to at least 60 deg    Status Achieved      Brianna Barr SHORT TERM GOAL #5   Title Brianna Barr will demo carry of up to 15lb box anteriorly and 10lb unilateral carry at side with good alignment and tolerance.    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    Baseline -    Time 8    Period Weeks    Status New    Target Date 01/14/22      Brianna Barr LONG TERM GOAL #3   Title Brianna Barr will improve postural strength for upper quadrants, trunk/core, and hips/thighs to at least 4+/5 for improved work Forensic scientist.    Baseline strength ranges 3+/5 to 4/5    Time 8    Period Weeks    Status New    Target Date 01/14/22      Brianna Barr LONG TERM GOAL #4   Title FOTO improved to 61% or greater to demo improved function.    Baseline 44% at eval    Time 8    Period Weeks    Status New    Target Date 01/14/22      Brianna Barr LONG TERM GOAL #5   Title Brianna Barr will be able to perform standing and seated tasks for up to 1 hour with min or no pain for improved work and community tolerance.    Time 8    Period Weeks    Status New    Target Date 01/14/22                   Plan - 12/19/21 1228     Clinical Impression Statement The patient reports continued back, buttock and thigh symptoms but intermittently and now lower in intensity.  Minimal verbal cues for abdominal brace in standing and to coordinate breathing with exercise.  Good response to DN coupled with ES with improved lumbar soft tissue mobilty noted and decreased gluteal tender point size and number.    Personal Factors and Comorbidities Time since onset of injury/illness/exacerbation;Fitness;Profession    Examination-Activity Limitations Locomotion Level;Bend;Sit;Squat;Stand;Lift;Carry    Brianna Barr Frequency 1x / week    Brianna Barr Duration  8 weeks    Brianna Barr Treatment/Interventions ADLs/Self Care Home Management;Electrical Stimulation;Moist Heat;Functional mobility training;Therapeutic activities;Therapeutic exercise;Neuromuscular re-education;Manual techniques;Patient/family education;Passive range of motion;Dry needling;Joint  Manipulations;Spinal Manipulations;Taping    Brianna Barr Next Visit Plan DN with ES;   progress to dead lifting;  glute med strengthening; review green band ex's as needed    Brianna Barr Home Exercise Plan Access Code: [email or web address]             Patient will benefit from skilled therapeutic intervention in order to improve the following deficits and impairments:  Abnormal gait, Pain, Postural dysfunction, Decreased strength, Decreased mobility, Impaired flexibility, Improper body mechanics, Decreased activity tolerance  Visit Diagnosis: Chronic bilateral low back pain without sciatica  Abnormal posture  Muscle weakness (generalized)     Problem List Patient Active Problem List   Diagnosis Date Noted   Anxiety and depression 07/07/2019   Mild intermittent asthma without complication 07/07/2019   Seasonal allergies 07/07/2019   Plantar fasciitis 07/07/2019   Hyperhidrosis 07/07/2019   OAB (overactive bladder) 07/07/2019   Allergy 08/06/2018   Brianna Barr, Brianna Barr 12/19/21 1:09 PM Phone: [phone] Fax: [phone]  Brianna Barr, Brianna Barr 12/19/2021, 1:09 PM  [facility] [address] [place], [place], [address] Phone: [phone]   Fax:  [phone]  Name: Brianna Barr MRN: [id number] Date of Birth: 1996/03/20

## 2021-12-19 NOTE — Patient Instructions (Signed)
Access Code: [email or web address] URL: [email or web address] Date: 12/19/2021 Prepared by: Lavinia Sharps  Exercises Seated Hamstring Stretch - 2 x daily - 7 x weekly - 1 sets - 2 reps - 30 hold Seated Flexion Stretch - 2 x daily - 7 x weekly - 1 sets - 3 reps - 30 hold Child's Pose Stretch - 2 x daily - 7 x weekly - 1 sets - 3 reps - 10 hold Child's Pose with Sidebending - 2 x daily - 7 x weekly - 1 sets - 3 reps - 10 hold Supine Transversus Abdominis Bracing - Hands on Stomach - 1 x daily - 7 x weekly - 1 sets - 5 reps Hooklying Isometric Hip Flexion with Opposite Arm - 1 x daily - 7 x weekly - 1 sets - 5 reps Supine Bent Knee Foot Taps - 1 x daily - 7 x weekly - 1 sets - 5 reps Standing Quadratus Lumborum Stretch with Doorway - 1 x daily - 7 x weekly - 1 sets - 3 reps - 20 hold Seated Quadratus Lumborum Stretch in Chair - 1 x daily - 7 x weekly - 1 sets - 3 reps - 20 hold Standing Lumbar Spine Flexion Stretch Counter - 1 x daily - 7 x weekly - 1 sets - 10 reps Cat Cow - 1 x daily - 7 x weekly - 1 sets - 10 reps Modified Cat Cow at Wall - 1 x daily - 7 x weekly - 1 sets - 10 reps Modified Cat Cow at Chair - 1 x daily - 7 x weekly - 1 sets - 10 reps Standing Hip Hinge with Dowel - 1 x daily - 7 x weekly - 1 sets - 10 reps Standing Hip Hinge - 1 x daily - 7 x weekly - 1 sets - 10 reps Standing Row with Anchored Resistance - 1 x daily - 7 x weekly - 1 sets - 10 reps Shoulder extension with resistance - Neutral - 1 x daily - 7 x weekly - 1 sets - 10 reps Standing Diagonal Shoulder Extension with Anchored Resistance - 1 x daily - 7 x weekly - 1 sets - 10 reps

## 2021-12-27 ENCOUNTER — Other Ambulatory Visit: Payer: Self-pay

## 2021-12-27 ENCOUNTER — Ambulatory Visit: Payer: Commercial Managed Care - PPO | Attending: Family Medicine | Admitting: Physical Therapy

## 2021-12-27 DIAGNOSIS — M545 Low back pain, unspecified: Secondary | ICD-10-CM | POA: Diagnosis not present

## 2021-12-27 DIAGNOSIS — G8929 Other chronic pain: Secondary | ICD-10-CM | POA: Diagnosis present

## 2021-12-27 DIAGNOSIS — M6281 Muscle weakness (generalized): Secondary | ICD-10-CM | POA: Diagnosis present

## 2021-12-27 DIAGNOSIS — R293 Abnormal posture: Secondary | ICD-10-CM | POA: Diagnosis present

## 2021-12-27 NOTE — Therapy (Signed)
[place] ?[facility] ?[address] ?[place], [place], [address] ?Phone: [phone]   Fax:  [phone] ? ?Physical Therapy Treatment ? ?Patient Details  ?Name: Brianna Barr ?MRN: [id number] ?Date of Birth: 01-27-1996 ?Referring Provider (PT): Deeann Saint, MD ? ? ?Encounter Date: 12/27/2021 ? ? PT End of Session - 12/27/21 0847   ? ? Visit Number 6   ? Date for PT Re-Evaluation 01/14/22   ? Authorization Type [employer], [facility] employee   ? Authorization Time Period through 10/26/22   ? Authorization - Visit Number 6   ? Authorization - Number of Visits 60   ? PT Start Time 0845   ? PT Stop Time 0925   DN; heat  ? PT Time Calculation (min) 40 min   ? Activity Tolerance Patient tolerated treatment well   ? ?  ?  ? ?  ? ? ?Past Medical History:  ?Diagnosis Date  ? Anxiety   ? Asthma   ? Depression   ? Overactive bladder   ? Stress incontinence   ? ? ?Past Surgical History:  ?Procedure Laterality Date  ? WISDOM TOOTH EXTRACTION    ? ? ?There were no vitals filed for this visit. ? ? Subjective Assessment - 12/27/21 0848   ? ? Subjective Just got off a 12 hours shift.  Worked 3 nights in a row.  Before that it's been better.  Stretches help.  DN with ES helps.   ? Pertinent History PMH: bil plantar fasciitis, vit D deficiency, fatigue, obesity, anxiety, depression   ? Currently in Pain? Yes   ? Pain Score 7    ? Pain Location Back   bil buttocks  ? Pain Orientation Right;Left   ? Pain Type Chronic pain   ? ?  ?  ? ?  ? ? ? ? ? ? ? ? ? ? ? ? ? ? ? ? ? ? ? ? [facility] Adult PT Treatment/Exercise - 12/27/21 0001   ? ?  ? Lumbar Exercises: Seated  ? Other Seated Lumbar Exercises discussion of symptoms and response to treatment, review of exercises and benefits for long term back health   ?  ? Moist Heat Therapy  ? Number Minutes Moist Heat 5 Minutes   ? Moist Heat Location Lumbar Spine   ?  ? Electrical Stimulation  ? Electrical Stimulation Location bil lumbar multifidi   ?  Electrical Stimulation Action 2.5 ma bil multifidi and upper glute bil  ? Electrical Stimulation Parameters 8 min   ? Electrical Stimulation Goals Pain   ?  ? Manual Therapy  ? Soft tissue mobilization bil lumbar paraspinals; bil glutes   ? ?  ?  ? ?  ? ? ? Trigger Point Dry Needling - 12/27/21 0001   ? ? Consent Given? Yes   ? Education Handout Provided Previously provided   ? Muscles Treated Back/Hip Lumbar multifidi, bil gluteals  ? Electrical Stimulation Performed with Dry Needling Yes   ? E-stim with Dry Needling Details bil   ? Gluteus Maximus Response Palpable increased muscle length   ? Lumbar multifidi Response Palpable increased muscle length   ? ?  ?  ? ?  ? ? ? ? ? ? ? ? ? ? PT Short Term Goals - 12/10/21 1819   ? ?  ? PT SHORT TERM GOAL #1  ? Title Pt will be ind with initial HEP   ? Status Achieved   ?  ?  PT SHORT TERM GOAL #2  ? Title Pt able to demo proper standing alignment and dynamic squat technique with min or no pain   ? Status Achieved   ?  ? PT SHORT TERM GOAL #3  ? Title Pt will be able to demo proper dynamic squat and lift technique with proper technique and min/no pain   ? Time 3   ? Period Weeks   ? Status On-going   ?  ? PT SHORT TERM GOAL #4  ? Title Pt will improve hamstring length to at least 60 deg   ? Status Achieved   ?  ? PT SHORT TERM GOAL #5  ? Title Pt will demo carry of up to 15lb box anteriorly and 10lb unilateral carry at side with good alignment and tolerance.   ? Time 4   ? Period Weeks   ? Status On-going   ? ?  ?  ? ?  ? ? ? ? PT Long Term Goals - 11/19/21 0950   ? ?  ? PT LONG TERM GOAL #1  ? Title independent with advanced HEP   ? Time 8   ? Period Weeks   ? Status New   ? Target Date 01/14/22   ?  ? PT LONG TERM GOAL #2  ? 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   ? Baseline -   ? Time 8   ? Period Weeks   ? Status New   ? Target Date 01/14/22   ?  ? PT LONG TERM GOAL #3  ? Title Pt will  improve postural strength for upper quadrants, trunk/core, and hips/thighs to at least 4+/5 for improved work Forensic scientist.   ? Baseline strength ranges 3+/5 to 4/5   ? Time 8   ? Period Weeks   ? Status New   ? Target Date 01/14/22   ?  ? PT LONG TERM GOAL #4  ? Title FOTO improved to 61% or greater to demo improved function.   ? Baseline 44% at eval   ? Time 8   ? Period Weeks   ? Status New   ? Target Date 01/14/22   ?  ? PT LONG TERM GOAL #5  ? Title Pt will be able to perform standing and seated tasks for up to 1 hour with min or no pain for improved work and community tolerance.   ? Time 8   ? Period Weeks   ? Status New   ? Target Date 01/14/22   ? ?  ?  ? ?  ? ? ? ? ? ? ? ? Plan - 12/27/21 0908   ? ? 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  She reports prior to that benefit from exercises along with DN combined with ES.  Tender points identified in bil glutes and lumbar paraspinals.  Improved soft tissue mobility noted after treatment session.   ? Personal Factors and Comorbidities Time since onset of injury/illness/exacerbation;Fitness;Profession   ? Examination-Activity Limitations Locomotion Level;Bend;Sit;Squat;Stand;Lift;Carry   ? Examination-Participation Restrictions Occupation;Cleaning;Community Activity;Driving;Shop;Laundry   ? Rehab Potential Good   ? PT Frequency 1x / week   ? PT Duration 8 weeks   ? PT Treatment/Interventions ADLs/Self  Care Home Management;Electrical Stimulation;Moist Heat;Functional mobility training;Therapeutic activities;Therapeutic exercise;Neuromuscular re-education;Manual techniques;Patient/family education;Passive range of motion;Dry needling;Joint Manipulations;Spinal Manipulations;Taping   ? PT Next Visit Plan DN with ES;   progress to dead lifting;  glute med strengthening; review  green band ex's as needed   ? PT Home Exercise Plan Access Code: [email or web address]   ? ?  ?  ? ?  ? ? ?Patient will benefit from skilled therapeutic intervention in order to improve the following deficits and impairments:  Abnormal gait, Pain, Postural dysfunction, Decreased strength, Decreased mobility, Impaired flexibility, Improper body mechanics, Decreased activity tolerance ? ?Visit Diagnosis: ?Chronic bilateral low back pain without sciatica ? ?Abnormal posture ? ?Muscle weakness (generalized) ? ? ? ? ?Problem List ?Patient Active Problem List  ? Diagnosis Date Noted  ? Anxiety and depression 07/07/2019  ? Mild intermittent asthma without complication 07/07/2019  ? Seasonal allergies 07/07/2019  ? Plantar fasciitis 07/07/2019  ? Hyperhidrosis 07/07/2019  ? OAB (overactive bladder) 07/07/2019  ? Allergy 08/06/2018  ? ?Lavinia Sharps, PT ?12/27/21 9:23 AM ?Phone: [phone] ?Fax: [phone]  ?Vivien Presto, PT ?12/27/2021, 9:22 AM ? ?[place] ?[facility] ?[address] ?[place], [place], [address] ?Phone: [phone]   Fax:  [phone] ? ?Name: Brianna Barr ?MRN: [id number] ?Date of Birth: July 21, 1996 ? ? ? ?

## 2022-01-01 ENCOUNTER — Other Ambulatory Visit: Payer: Self-pay

## 2022-01-01 ENCOUNTER — Ambulatory Visit (INDEPENDENT_AMBULATORY_CARE_PROVIDER_SITE_OTHER): Payer: Commercial Managed Care - PPO | Admitting: Psychiatry

## 2022-01-01 DIAGNOSIS — F411 Generalized anxiety disorder: Secondary | ICD-10-CM

## 2022-01-01 NOTE — Progress Notes (Signed)
?    [facility] Counselor/Therapist Progress Note ? ?Patient ID: Brianna Barr, MRN: [id number],   ? ?Date: 01/01/2022 ? ?Time Spent: 55 minutes  ? ?Treatment Type: Individual Therapy ? ?Reported Symptoms: anxiety (stronger symptom), depression ? ?Mental Status Exam: ? ?Appearance:   Neat     ?Behavior:  Appropriate, Sharing, and Motivated  ?Motor:  Normal  ?Speech/Language:   Clear and Coherent  ?Affect:  Anxious, some depression  ?Mood:  anxious and depressed  ?Thought process:  goal directed  ?Thought content:    Overthinking and  some obsessiveness  ?Sensory/Perceptual disturbances:    WNL  ?Orientation:  oriented to person, place, time/date, situation, day of week, month of year, year, and stated date of January 01, 2022  ?Attention:  Good  ?Concentration:  Good and Fair  ?Memory:  WNL  ?Fund of knowledge:   Good  ?Insight:    Good  ?Judgment:   Good  ?Impulse Control:  Good  ? ?Risk Assessment: ?Danger to Self:  No ?Self-injurious Behavior: No ?Danger to Others: No ?Duty to Warn:no ?Physical Aggression / Violence:No  ?Access to Firearms a concern: No  ?Gang Involvement:No  ? ?Subjective:  Patient in today reporting anxiety and some depression mostly related to personal, family relationships, social situations, some social anxiety adjusting to new church which is important for her. Has joined a small group that meets every other Tuesday evening and went last night for the first time and did have a panic attack in her parked car afterwards "as I focused on all the awkward things I did or said in the small group. "Processed her feelings, reactions, and desires in session today as well as some ways she can relate more in the group and feel more comfortable sharing herself with others and becoming more actively involved. Situation with very strong-willed mom discussed last session is some better and patient feels it's more because mom has returned to work after being out on medical leave after surgery. Wanting  to take a break in one area of work and hesitant to ask for it---worked on this today and able to understand it is part of her own good self care to ask. Processed this more as well as spoke about how she might ask and what she would want to say.Reviewed communication skills especially in stating her needs or opinions. ? ?Interventions: Solution-Oriented/Positive Psychology, Ego-Supportive, and Insight-Oriented ? ?Treatment goal plan:   ?Patient not signing tx plan on computer screen due to COVID. ?Treatment Goals: ?Goals will remain on tx plan as patient works with strategies to meet her goals.  Progress will be noted each session and documented in "Progress" section of Plan. ?Long term goal: ?Reduce overall level, frequency, and intensity of the anxiety so that daily functioning is not impaired. ?Short term goal: ?Increase understanding of beliefs and messages that produce anxiety, depression, or "worries". ?Strategy: ?Identify, challenge, and replace anxious/depressive/fearful self-talk with positive, realistic, and empowering self-talk. ?  ?Diagnosis: ?  ICD-10-CM   ?1. Generalized anxiety disorder  F41.1   ?  ? ?Plan:  Patient today showing motivation and active participation in session as she continued work on her anxiety and depression stemming from multiple areas of her life including parent and other family relationships, personal and work. Processed some self-esteem issues, work-related communication skills, and continue her efforts to be more comfortable and less anxious with other people and in various situations. (Not all details included in this note due to patient privacy needs.) Reviewed goal-directed behaviors  for her to continue to work on between sessions.  Also having more healthy space between her and parents. Encouraged patient in her practice of more positive behaviors including: Believing in herself more and her ability to make significant changes, recognizing and reflecting daily on her  progress, for every negative thought replace with 2 positives, refraining from taking on extra work shifts as this adds more stress for her, being more open to meeting new friends when she has the opportunity, consistent positive self talk, staying in touch with her small core group of friends, interrupting anxious/negative/depressive thoughts and challenging them to replace with more realistic and empowering thoughts that do not feed her anxiety nor depression, identifying herself in more positive ways, staying in the present focusing on what she can change, saying no when she needs to say no without guilt, allow for good sleep patterns, keeping her expectations realistic, staying in contact with supportive people, setting and keeping healthy boundaries, refraining from over personalizing the comments of other people including her family, taking breaks as she can throughout the day, and recognize the strength she shows working with goal-directed behaviors to move in a direction that supports improved emotional health. ? ?Goal review and progress/challenges with patient. ? ?Next appointment within 2 to 3 weeks. ? ?This record has been created using [employer].  Chart creation errors have been sought, but may not always have been located and corrected.  Such creation errors do not reflect on the standard of medical care provided. ? ? ?Mathis Fare, LCSW ? ? ? ? ? ? ? ? ? ? ? ? ? ? ? ? ? ? ?

## 2022-01-03 ENCOUNTER — Ambulatory Visit: Payer: Commercial Managed Care - PPO | Admitting: Physical Therapy

## 2022-01-03 ENCOUNTER — Other Ambulatory Visit: Payer: Self-pay

## 2022-01-03 DIAGNOSIS — R293 Abnormal posture: Secondary | ICD-10-CM

## 2022-01-03 DIAGNOSIS — M545 Low back pain, unspecified: Secondary | ICD-10-CM | POA: Diagnosis not present

## 2022-01-03 DIAGNOSIS — M6281 Muscle weakness (generalized): Secondary | ICD-10-CM

## 2022-01-03 DIAGNOSIS — G8929 Other chronic pain: Secondary | ICD-10-CM

## 2022-01-03 NOTE — Therapy (Signed)
[facility] [address] [place], [place], [address] Phone: [phone]   Fax:  [phone]  Physical Therapy Treatment  Patient Details  Name: Brianna Barr MRN: [id number] Date of Birth: 1996-03-28 Referring Provider (PT): Billie Ruddy, MD   Encounter Date: 01/03/2022   PT End of Session - 01/03/22 0922     Visit Number 7    Date for PT Re-Evaluation 01/14/22    Authorization Type [employer], [facility] employee    Authorization - Visit Number 7    Authorization - Number of Visits 10    PT Start Time 0845    PT Stop Time 0927    PT Time Calculation (min) 42 min    Activity Tolerance Patient tolerated treatment well             Past Medical History:  Diagnosis Date   Anxiety    Asthma    Depression    Overactive bladder    Stress incontinence     Past Surgical History:  Procedure Laterality Date   WISDOM TOOTH EXTRACTION      There were no vitals filed for this visit.   Subjective Assessment - 01/03/22 0848     Subjective It's getting better.  Still some there.  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  Doing Tai Chi video.  Going for walks when not working.    Pertinent History PMH: bil plantar fasciitis, vit D deficiency, fatigue, obesity, anxiety, depression    How long can you sit comfortably? 45-60 min (LBP)    How long can you walk comfortably? can improve LBP but bil feet pain limits walking    Currently in Pain? Yes    Pain Score 4     Pain Location Back    Pain Orientation Right;Left    Pain Type Chronic pain                               [facility] Adult PT Treatment/Exercise - 01/03/22 0001       Lumbar Exercises: Standing   Other Standing Lumbar Exercises dead lift with green band 12x    Other Standing Lumbar Exercises green band Pallof series: ABCs, stir the pot, press, back lunge, march kickstand position      Acupuncturist Location bil  lumbar multifidi    Electrical Stimulation Action 2.5 MA bil multifidi    Electrical Stimulation Parameters 8 min    Electrical Stimulation Goals Pain      Manual Therapy   Soft tissue mobilization bil lumbar paraspinals; bil glutes              Trigger Point Dry Needling - 01/03/22 0001     Consent Given? Yes    Education Handout Provided Previously provided    Muscles Treated Back/Hip Lumbar multifidi    Electrical Stimulation Performed with Dry Needling Yes    E-stim with Dry Needling Details bil    Gluteus Maximus Response Palpable increased muscle length    Lumbar multifidi Response Palpable increased muscle length                     PT Short Term Goals - 12/10/21 1819       PT SHORT TERM GOAL #1   Title Pt will be ind with initial HEP    Status Achieved      PT SHORT TERM GOAL #  2   Title Pt able to demo proper standing alignment and dynamic squat technique with min or no pain    Status Achieved      PT SHORT TERM GOAL #3   Title Pt will be able to demo proper dynamic squat and lift technique with proper technique and min/no pain    Time 3    Period Weeks    Status On-going      PT SHORT TERM GOAL #4   Title Pt will improve hamstring length to at least 60 deg    Status Achieved      PT SHORT TERM GOAL #5   Title Pt will demo carry of up to 15lb box anteriorly and 10lb unilateral carry at side with good alignment and tolerance.    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    Baseline -    Time 8    Period Weeks    Status New    Target Date 01/14/22      PT LONG TERM GOAL #3   Title Pt will improve  postural strength for upper quadrants, trunk/core, and hips/thighs to at least 4+/5 for improved work Public relations account executive.    Baseline strength ranges 3+/5 to 4/5    Time 8    Period Weeks    Status New    Target Date 01/14/22      PT LONG TERM GOAL #4   Title FOTO improved to 61% or greater to demo improved function.    Baseline 44% at eval    Time 8    Period Weeks    Status New    Target Date 01/14/22      PT LONG TERM GOAL #5   Title Pt will be able to perform standing and seated tasks for up to 1 hour with min or no pain for improved work and community tolerance.    Time 8    Period Weeks    Status New    Target Date 01/14/22                   Plan - 01/03/22 0920     Clinical Impression Statement The patient reports significantly decreased pain level compared to last week.  She is able to progress core ex's in standing without pain exacerbation.  Good response to DN combined with ES.  On track to meet rehab goals.    Personal Factors and Comorbidities Time since onset of injury/illness/exacerbation;Fitness;Profession    Examination-Activity Limitations Locomotion Level;Bend;Sit;Squat;Stand;Lift;Carry    Examination-Participation Restrictions Occupation;Cleaning;Community Activity;Driving;Shop;Laundry    Rehab Potential Good    PT Frequency 1x / week    PT Duration 8 weeks    PT Treatment/Interventions ADLs/Self Care Home Management;Electrical Stimulation;Moist Heat;Functional mobility training;Therapeutic activities;Therapeutic exercise;Neuromuscular re-education;Manual techniques;Patient/family education;Passive range of motion;Dry needling;Joint Manipulations;Spinal Manipulations;Taping    PT Next Visit Plan check FOTO and goals next visit; dead lifting; glute ex's; green  band progression    PT Home Exercise Plan Access Code: Tamms             Patient will benefit from skilled therapeutic intervention in order to improve the following deficits and  impairments:  Abnormal gait, Pain, Postural dysfunction, Decreased strength, Decreased mobility, Impaired flexibility, Improper body mechanics, Decreased activity tolerance  Visit Diagnosis: Chronic bilateral low back pain without sciatica  Abnormal posture  Muscle weakness (generalized)     Problem List Patient Active Problem List   Diagnosis Date Noted   Anxiety and depression 07/07/2019   Mild intermittent asthma without complication 93/08/2161   Seasonal allergies 07/07/2019   Plantar fasciitis 07/07/2019   Hyperhidrosis 07/07/2019   OAB (overactive bladder) 07/07/2019   Allergy 08/06/2018   Ruben Im, PT 01/03/22 9:23 AM Phone: [phone] Fax: [phone]  Alvera Singh, PT 01/03/2022, 9:23 AM  [facility] @ [address] [place], [place], [address] Phone: [phone]   Fax:  [phone]  Name: Brianna Barr MRN: [id number] Date of Birth: 1996-01-28

## 2022-01-10 ENCOUNTER — Ambulatory Visit: Payer: Commercial Managed Care - PPO | Admitting: Physical Therapy

## 2022-01-14 ENCOUNTER — Ambulatory Visit (INDEPENDENT_AMBULATORY_CARE_PROVIDER_SITE_OTHER): Payer: Commercial Managed Care - PPO

## 2022-01-14 ENCOUNTER — Other Ambulatory Visit: Payer: Self-pay

## 2022-01-14 ENCOUNTER — Other Ambulatory Visit (HOSPITAL_COMMUNITY)
Admission: RE | Admit: 2022-01-14 | Discharge: 2022-01-14 | Disposition: A | Discharge status: Home / Self Care | Payer: Commercial Managed Care - PPO | Source: Ambulatory Visit

## 2022-01-14 VITALS — BP 114/74 | HR 96 | Ht 62.0 in | Wt 232.0 lb

## 2022-01-14 DIAGNOSIS — Z124 Encounter for screening for malignant neoplasm of cervix: Secondary | ICD-10-CM | POA: Diagnosis not present

## 2022-01-14 DIAGNOSIS — Z01419 Encounter for gynecological examination (general) (routine) without abnormal findings: Secondary | ICD-10-CM | POA: Diagnosis present

## 2022-01-14 DIAGNOSIS — Z1239 Encounter for other screening for malignant neoplasm of breast: Secondary | ICD-10-CM

## 2022-01-14 DIAGNOSIS — N942 Vaginismus: Secondary | ICD-10-CM

## 2022-01-14 NOTE — Progress Notes (Signed)
? ? ?GYNECOLOGY OFFICE VISIT NOTE-WELL WOMAN EXAM ? ?History:  ? Brianna Barr is a 26 year old G0 AA female here today for annual exam. She presents well-groomed and in pleasant mood.  She expresses concern and has questions regarding her diagnosis and struggles with vaginismus. She states she was being followed by Urogyn and was last seen ~ 10 months ago.  ? ?Birth Control:  Mirena placed in 2019. Satisfied. Occasional spotting and cramping.  ? ?Reproductive Concerns ?Sexually Active: None Currently and none in the past ?Partners Type: N/A ?Number of partners in last year: None ?STD Testing: None ? ?Vaginal/GU Concerns: She reports stress and urge incontinence. She was taking Ditropan with improvement. She denies any abnormal vaginal discharge, bleeding, pelvic pain outside of her vaginismus.  ? ?Breast Concerns/Exams:Reports known fibrocystic breast and occasional lumps in breast that have been evaluated by [facility]. Last evaluation was in Jan. Reports monthly breast exams on the first of month.  ?Endorses family history of breast cancer in maternal grandmother and aunt. She denies known history uterine, cervical, or ovarian cancer. Reports paternal grandmother ? ?Medical and Nutrition ?PCP: Abbe Amsterdam, MD who was last seen last month ? ?Significant PMx: Plantar Fascitis, Slipped disk, Vaginismus, Urinary incontinence. IBS on Linzess, Asthma that is seasonal and exercise induced. Anxiety and Depression-takes Wellbutrin, Hydroxyzine, Propanolol,and Lexapro.  ? ?Exercise: Reports walking and/or biking 3x week.  If unable to get outside dance or cycle.  Reports 30 minutes sessions. ? ?Tobacco/Drugs/Alcohol: Denies ? ?Nutrition: Greatly improved ? ?Social ?Safety at home: Endorses ?DV/A: Lives alone ?Social Support: Endorses ?Employment: [employer]; [employer] ? ?Past Medical History:  ?Diagnosis Date  ? Anxiety   ? Asthma   ? Depression   ? Overactive bladder   ? Stress incontinence   ? ? ?Past  Surgical History:  ?Procedure Laterality Date  ? WISDOM TOOTH EXTRACTION    ? ? ?The following portions of the patient's history were reviewed and updated as appropriate: allergies, current medications, past family history, past medical history, past social history, past surgical history and problem list.  ? ?Health Maintenance:  Normal pap and negative HRHPV on Sept 2020.  Normal  breast US on 09/2020.  ? ?Review of Systems:  ?Pertinent items noted in HPI and remainder of comprehensive ROS otherwise negative.   ? ?Objective:  ?  ?Physical Exam ?BP 114/74   Pulse 96   Ht 5\' 2"  (1.575 m)   Wt 232 lb (105.2 kg)   BMI 42.43 kg/m?  ?Physical Exam ?Vitals reviewed. Exam conducted with a chaperone present.  ?Constitutional:   ?   General: She is not in acute distress. ?   Appearance: Normal appearance. She is obese. She is not toxic-appearing.  ?HENT:  ?   Head: Normocephalic and atraumatic.  ?Eyes:  ?   Conjunctiva/sclera: Conjunctivae normal.  ?Cardiovascular:  ?   Rate and Rhythm: Normal rate and regular rhythm.  ?   Heart sounds: Normal heart sounds.  ?Pulmonary:  ?   Effort: Pulmonary effort is normal. No respiratory distress.  ?   Breath sounds: Normal breath sounds.  ?Chest:  ?Breasts: ?   Right: Normal. No nipple discharge, skin change or tenderness.  ?   Left: Normal. No nipple discharge, skin change or tenderness.  ?   Comments: CBE performed and normal ?Abdominal:  ?   General: Bowel sounds are normal.  ?   Palpations: Abdomen is soft.  ?   Tenderness: There is no abdominal tenderness.  ?  Genitourinary: ?   Labia:     ?   Right: No tenderness or lesion.     ?   Left: No tenderness or lesion.   ?   Vagina: Vaginal discharge present. No bleeding.  ?   Cervix: Friability present. No discharge, lesion or cervical bleeding.  ?   Comments: Small amt white curdy discharge.  ?Pap collected with brush and spatula ?Blue IUD strings noted from OS ~ 2cm in length. ? ?Musculoskeletal:     ?   General: Normal range of  motion.  ?   Cervical back: Normal range of motion.  ?Lymphadenopathy:  ?   Upper Body:  ?   Right upper body: No axillary or pectoral adenopathy.  ?   Left upper body: No axillary or pectoral adenopathy.  ?Skin: ?   General: Skin is warm and dry.  ?Neurological:  ?   Mental Status: She is alert and oriented to person, place, and time.  ?Psychiatric:     ?   Mood and Affect: Mood normal.     ?   Behavior: Behavior normal.     ?   Thought Content: Thought content normal.  ?  ? ?Labs and Imaging ?No results found for this or any previous visit (from the past 168 hour(s)). ?No results found. ?  ?Assessment & Plan:  ?26 year old Female ?Well Woman Exam ?Pap Smear ?Breast Exam ?Mirena IUD in Place ?Vaginismus ? ?1. Well woman exam with routine gynecological exam ?-Exam performed and findings discussed. ?-Informed of findings suspicious for yeast. However, no complaints so will defer treatment. Instructed to treat OTC if itching, irritation, or abnormal discharge noted.  ?-BME deferred d/t vaginismus and as an attempt to decrease patient discomfort.  ?-Encouraged to activate and utilize Mychart for reviewing of results, communication with office, and scheduling of appts. ?- Cytology - PAP( [facility]) ? ?2. Pap smear for cervical cancer screening ?-Pap smear collected ?-If normal will follow ASCCP guidelines regarding pap smear evaluation and frequency. ?-Results to be released via mychart. ? ?3. Encounter for screening breast examination ?-Continue monthly SBE with increased breast awareness including examination of breast for skin changes, moles, tenderness, etc.  ? ?4. Vaginismus ?-Reviewed concerns regarding vaginismus and POC. ?-Encouraged to return to Urogyn and continue POC as previously implemented with modifications as appropriate. ?-Informed that improvement of symptoms can be minimal and take several years without full relief. ?-Patient expresses frustrations and support given. ? ? ?Routine preventative health  maintenance measures emphasized. ?Please refer to After Visit Summary for other counseling recommendations.  ? ?No follow-ups on file. ?  ?  ? ?Cherre Robins, CNM ?01/14/2022  ? ? ? ? ?

## 2022-01-15 ENCOUNTER — Ambulatory Visit (INDEPENDENT_AMBULATORY_CARE_PROVIDER_SITE_OTHER): Payer: Commercial Managed Care - PPO | Admitting: Psychiatry

## 2022-01-15 DIAGNOSIS — F411 Generalized anxiety disorder: Secondary | ICD-10-CM

## 2022-01-15 NOTE — Progress Notes (Signed)
?    [facility] Counselor/Therapist Progress Note ? ?Patient ID: Brianna Barr, MRN: [id number],   ? ?Date: 01/15/2022 ? ?Time Spent: 55 minutes  ? ?Treatment Type: Individual Therapy ? ?Reported Symptoms: anxiety, depression (some improvement) ? ?Mental Status Exam: ? ?Appearance:   Neat     ?Behavior:  Appropriate, Sharing, and Motivated  ?Motor:  Normal  ?Speech/Language:   Clear and Coherent  ?Affect:  Anxious, some depression  ?Mood:  anxious and depressed  ?Thought process:  normal  ?Thought content:    Obsessions and overthinking  ?Sensory/Perceptual disturbances:    WNL  ?Orientation:  oriented to person, place, time/date, situation, day of week, month of year, year, and stated date of January 15, 2022  ?Attention:  Good  ?Concentration:  Good and Fair  ?Memory:  WNL  ?Fund of knowledge:   Good  ?Insight:    Good  ?Judgment:   Good  ?Impulse Control:  Good  ? ?Risk Assessment: ?Danger to Self:  No ?Self-injurious Behavior: No ?Danger to Others: No ?Duty to Warn:no ?Physical Aggression / Violence:No  ?Access to Firearms a concern: No  ?Gang Involvement:No  ? ?Subjective:   Patient in today reporting anxiety as stronger symptom, and some depression that has decreased some recently. Symptoms mostly related to personal, social, health, family, work and new church issues. Family and new church issues are the areas she is struggling with the most right now in her anxiety and depression. Wanted to process more of her thoughts and experiences within her new church. "Expectations of myself can be high" and feels she is not getting  as involved as quickly as she had expected. Shared some of her church group experiences that did not seem helpful to her and she felt alone. Considering how she can feel more connected at church. It feels like my inability to join along in conversation and difficulty making friends, "is like something  is wrong with me." Worked with these thoughts as they are getting in the way of  patient feeling better about herself and taking risks in reaching out. Reports having more insight after discussion today and plans to follow through in some behavior changes, that could lead her to feel more involved and connected to others at her church and in other settings. ? ?Interventions: Solution-Oriented/Positive Psychology and Insight-Oriented ? ?Treatment goal plan:   ?Patient not signing tx plan on computer screen due to COVID. ?Treatment Goals: ?Goals will remain on tx plan as patient works with strategies to meet her goals.  Progress will be noted each session and documented in "Progress" section of Plan. ?Long term goal: ?Reduce overall level, frequency, and intensity of the anxiety so that daily functioning is not impaired. ?Short term goal: ?Increase understanding of beliefs and messages that produce anxiety, depression, or "worries". ?Strategy: ?Identify, challenge, and replace anxious/depressive/fearful self-talk with positive, realistic, and empowering self-talk. ? ? ?Diagnosis: ?  ICD-10-CM   ?1. Generalized anxiety disorder  F41.1   ?  ? ?Plan: Patient today is motivated and participates well in session as she continues to focus on her anxiety and depression related to several areas of her life including her personal, work, parent, and other family relationships.  Did well in processing some of her concerns and situations at a new church that she is "trying out" and also within other groups of people where she does not feel as comfortable as she would like to, and senses that her personal/communication skills are not as good.  Encouraged her  not to be down on herself and to think more about how she would like to interact with others which she did in session today and we were able to come up with several options that she could try out in more social settings or as she continues to visit this new her church, understanding that it is okay for her to be her and she does not have to be like  everybody else.  Processed some more of her self esteem issues as well as communicating with and getting to know new people in her church or other places.  Continues to have "healthy space" between her and her parents after patient's having moved out into her own new home for the first time as a young adult.  Patient did really well with this discussion and seemed to feel more encouraged upon leaving. Encouraged patient in her practice of more positive behaviors including: Continue to refrain from taking on extra work shifts as this adds more stress for her, recognizing and reflecting daily on her progress, believing in herself more in her ability to make significant changes, for every negative thought replace with 2 positives, being more open to meeting new friends when she has the opportunity, consistent positive self talk, staying in touch with her small core group of friends, interrupting anxious/negative/depressive thoughts and challenging them to replace with more realistic and empowering thoughts that do not feed her anxiety nor depression, identifying herself in more positive ways, staying in the present focusing on what she can change, saying no when she needs to say no without guilt, allow for good sleep patterns, keeping her expectations realistic, staying in contact with supportive people, setting and keeping healthy boundaries, refraining from over personalizing the comments of other people including her family, taking breaks as she can throughout the day, and realize the strength she shows working with goal-directed behaviors to move in a direction that supports her improved emotional health and overall wellbeing. ? ?Goal review and progress/challenges noted with patient. ? ?Next appointment within 2 to 3 weeks. ? ?This record has been created using [employer].  Chart creation errors have been sought, but may not always have been located and corrected.  Such creation errors do not reflect on the  standard of medical care provided. ? ? ?Mathis Fare, LCSW ? ? ? ? ? ? ? ? ? ? ? ? ? ? ? ? ? ? ?

## 2022-01-16 LAB — CYTOLOGY - PAP: Diagnosis: NEGATIVE

## 2022-01-27 ENCOUNTER — Encounter: Payer: Self-pay | Admitting: Adult Health

## 2022-01-27 ENCOUNTER — Ambulatory Visit (INDEPENDENT_AMBULATORY_CARE_PROVIDER_SITE_OTHER): Payer: Commercial Managed Care - PPO | Admitting: Adult Health

## 2022-01-27 DIAGNOSIS — F411 Generalized anxiety disorder: Secondary | ICD-10-CM | POA: Diagnosis not present

## 2022-01-27 DIAGNOSIS — F410 Panic disorder [episodic paroxysmal anxiety] without agoraphobia: Secondary | ICD-10-CM

## 2022-01-27 DIAGNOSIS — F331 Major depressive disorder, recurrent, moderate: Secondary | ICD-10-CM | POA: Diagnosis not present

## 2022-01-27 DIAGNOSIS — G4700 Insomnia, unspecified: Secondary | ICD-10-CM

## 2022-01-27 MED ORDER — ESCITALOPRAM OXALATE 20 MG PO TABS: 20.0000 mg | ORAL_TABLET | Freq: Every day | ORAL | 3 refills | Status: DC

## 2022-01-27 MED ORDER — PROPRANOLOL HCL 10 MG PO TABS: 10.0000 mg | ORAL_TABLET | Freq: Three times a day (TID) | ORAL | 3 refills | Status: DC

## 2022-01-27 NOTE — Progress Notes (Signed)
[name] ?[id number] ?January 21, 1996 ?26 y.o. ? ?Subjective:  ? ?Patient ID:  Brianna Barr is a 26 y.o. (DOB [birth year]) female. ?d ?Chief Complaint: No chief complaint on file. ? ? ?HPI ?Brianna Barr presents to the office today for follow-up of MDD, GAD, panic attacks, and insomnia. ? ?Describes mood today as "ok". Pleasant. Tearful at times. Mood symptoms - reports depression, anxiety, and irritability. Increased panic attacks. Increased worry and rumination. Stating "I feel like the past month has been very stressful. Uncle passed away unrepentantly. 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 Stable interest and motivation. Taking medications as prescribed. Seeing therapist bi-weekly - [name]. ?Energy levels "ok". Active, has a regular exercise routine. Walking and working with P/T. ?Enjoys some usual interests and activities. Single. Lives alone. Parents and 2 younger sisters local. Spending time with family. Has a core group of 3 friends. ?Appetite decreased. Weight loss. ?Sleeps better some nights than others. Averages 4 to 7 hours - feels more rested. ?Focus and concentration "ok" Completing tasks. Managing aspects of household. Works as a Marine scientist at [employer].  ?Denies SI or HI.  ?Denies AH or VH. ? ?Previous medication trials: Zoloft in college, Viibryd, Wellbutrin ? ? ?GAD-7   ? ?[place] Office Visit from 06/25/2020 in [place] at [facility] from 11/03/2019 in [place] at [facility] from 09/29/2019 in [place] at [facility] from 08/18/2019 in [place] at [facility] from 07/07/2019 in [place] at [place]  ?Total GAD-7 Score 18 7 17 19 14   ? ?  ? ?PHQ2-9   ? ?[place] Office Visit from 12/19/2021 in [place] at [facility] from 11/13/2021 in [place] at [facility] from 06/25/2020 in [place]  at [facility] from 11/03/2019 in [place] at [facility] from 09/29/2019 in [place] at [place]  ?PHQ-2 Total Score 3 4 4 3 4   ?PHQ-9 Total Score 11 15 16 9 12   ? ?  ? ?[place] [facility] from 06/06/2021 in [facility] [place] [facility] from 03/14/2021 in [facility] at [place]  ?C-SSRS RISK CATEGORY No Risk No Risk  ? ?  ?  ? ?Review of Systems:  ?Review of Systems  ?Musculoskeletal:  Negative for gait problem.  ?Neurological:  Negative for tremors.  ?Psychiatric/Behavioral:    ?     Please refer to HPI  ? ?Medications: I have reviewed the patient's current medications. ? ?Current Outpatient Medications  ?Medication Sig Dispense Refill  ? albuterol (VENTOLIN HFA) 108 (90 Base) MCG/ACT inhaler Inhale 2 puffs into the lungs every 6 (six) hours as needed for wheezing or shortness of breath. 6.7 g 1  ? buPROPion (WELLBUTRIN XL) 150 MG 24 hr tablet TAKE THREE TABLETS EVERY MORNING. 270 tablet 3  ? cyclobenzaprine (FLEXERIL) 5 MG tablet Take 1 tablet (5 mg total) by mouth at bedtime as needed for muscle spasms. 30 tablet 1  ? escitalopram (LEXAPRO) 10 MG tablet Take 1 tablet (10 mg total) by mouth daily. 90 tablet 3  ? hydrOXYzine (ATARAX) 25 MG tablet Take 1 tablet (25 mg total) by mouth 3 (three) times daily as needed. 270 tablet 3  ? levonorgestrel (MIRENA) 20 MCG/DAY IUD 1 each by Intrauterine route once.    ? linaclotide (LINZESS) 145 MCG CAPS capsule Take 1 capsule (145 mcg total) by mouth daily before breakfast. 90 capsule 3  ?  montelukast (SINGULAIR) 10 MG tablet Take 1 tablet (10 mg total) by mouth at bedtime. (Patient not taking: Reported on 01/14/2022) 90 tablet 0  ? oxybutynin (DITROPAN-XL) 10 MG 24 hr tablet Take 1 tablet (10 mg total) by mouth daily. (Patient not taking: Reported on 01/14/2022) 30 tablet 5  ? propranolol (INDERAL) 10 MG tablet Take 1 tablet (10 mg total) by mouth 2 (two) times daily. 180 tablet 3  ? Vitamin D,  Ergocalciferol, (DRISDOL) 1.25 MG (50000 UNIT) CAPS capsule Take 1 capsule (50,000 Units total) by mouth every 7 (seven) days. 12 capsule 0  ? ?No current facility-administered medications for this visit.  ? ? ?Medication Side Effects: None ? ?Allergies: No Known Allergies ? ?Past Medical History:  ?Diagnosis Date  ? Anxiety   ? Asthma   ? Depression   ? Overactive bladder   ? Stress incontinence   ? ? ?Past Medical History, Surgical history, Social history, and Family history were reviewed and updated as appropriate.  ? ?Please see review of systems for further details on the patient's review from today.  ? ?Objective:  ? ?Physical Exam:  ?There were no vitals taken for this visit. ? ?Physical Exam ?Constitutional:   ?   General: She is not in acute distress. ?Musculoskeletal:     ?   General: No deformity.  ?Neurological:  ?   Mental Status: She is alert and oriented to person, place, and time.  ?   Coordination: Coordination normal.  ?Psychiatric:     ?   Attention and Perception: Attention and perception normal. She does not perceive auditory or visual hallucinations.     ?   Mood and Affect: Mood normal. Mood is not anxious or depressed. Affect is not labile, blunt, angry or inappropriate.     ?   Speech: Speech normal.     ?   Behavior: Behavior normal.     ?   Thought Content: Thought content normal. Thought content is not paranoid or delusional. Thought content does not include homicidal or suicidal ideation. Thought content does not include homicidal or suicidal plan.     ?   Cognition and Memory: Cognition and memory normal.     ?   Judgment: Judgment normal.  ?   Comments: Insight intact  ? ? ?Lab Review:  ?   ?Component Value Date/Time  ? NA 137 11/13/2021 0940  ? K 4.2 11/13/2021 0940  ? CL 103 11/13/2021 0940  ? CO2 27 11/13/2021 0940  ? GLUCOSE 113 (H) 11/13/2021 0940  ? BUN 13 11/13/2021 0940  ? CREATININE 0.81 11/13/2021 0940  ? CALCIUM 9.1 11/13/2021 0940  ? PROT 6.6 04/11/2021 0745  ? ALBUMIN 3.9  04/11/2021 0745  ? AST 9 04/11/2021 0745  ? ALT 10 04/11/2021 0745  ? ALKPHOS 76 04/11/2021 0745  ? BILITOT 0.3 04/11/2021 0745  ? ? ?   ?Component Value Date/Time  ? WBC 11.1 (H) 11/13/2021 0940  ? RBC 4.79 11/13/2021 0940  ? HGB 12.8 11/13/2021 0940  ? HCT 39.6 11/13/2021 0940  ? PLT 336.0 11/13/2021 0940  ? MCV 82.7 11/13/2021 0940  ? MCH 23.4 (L) 10/03/2020 1146  ? MCHC 32.4 11/13/2021 0940  ? RDW 14.0 11/13/2021 0940  ? LYMPHSABS 2.7 11/13/2021 0940  ? MONOABS 0.6 11/13/2021 0940  ? EOSABS 0.2 11/13/2021 0940  ? BASOSABS 0.1 11/13/2021 0940  ? ? ?No results found for: POCLITH, LITHIUM  ? ?No results found for: PHENYTOIN, PHENOBARB, VALPROATE, CBMZ  ? ?.  res ?Assessment: Plan:   ? ?Plan: ? ?PDMP reviewed ? ?Hydroxyzine 25mg  - 1 to 2 at bedtime. ?Wellbutrin XL 450mg  every morning. Denies seizure history. ?Increase Propranolol 10mg  BID to TID ?Increase Lexapro 10mg  daily ? ?RTC 3 months ? ?Time spent with patient was 25 minutes. Greater than 50% of face to face time with patient was spent on counseling and coordination of care.     ? ?Patient advised to contact office with any questions, adverse effects, or acute worsening in signs and symptoms. ?  ?RTC 4 weeks ? ?There are no diagnoses linked to this encounter.  ? ?Please see After Visit Summary for patient specific instructions. ? ?Future Appointments  ?Date Time Provider [place]  ?01/29/2022  9:00 AM Shanon Ace, LCSW CP-CP None  ?02/12/2022 10:00 AM Shanon Ace, LCSW CP-[facility] None  ?02/26/2022  9:00 AM Shanon Ace, LCSW CP-CP None  ?03/12/2022  9:00 AM Shanon Ace, LCSW CP-CP None  ?03/26/2022  9:00 AM Shanon Ace, LCSW CP-CP None  ? ? ?No orders of the defined types were placed in this encounter. ? ? ?------------------------------- ?

## 2022-01-29 ENCOUNTER — Ambulatory Visit (INDEPENDENT_AMBULATORY_CARE_PROVIDER_SITE_OTHER): Payer: Commercial Managed Care - PPO | Admitting: Psychiatry

## 2022-01-29 DIAGNOSIS — F411 Generalized anxiety disorder: Secondary | ICD-10-CM | POA: Diagnosis not present

## 2022-01-29 NOTE — Progress Notes (Signed)
?    [facility] Counselor/Therapist Progress Note ? ?Patient ID: Brianna Barr, MRN: [id number],   ? ?Date: 01/29/2022 ? ?Time Spent: 55 minutes  ? ?Treatment Type: Individual Therapy ? ?Reported Symptoms: anxiety, depression ? ?Mental Status Exam: ? ?Appearance:   Casual     ?Behavior:  Appropriate, Sharing, and Motivated  ?Motor:  Normal  ?Speech/Language:   Clear and Coherent  ?Affect:  Anxious, depressed  ?Mood:  anxious and depressed  ?Thought process:  goal directed  ?Thought content:    Obsessions and overthinking  ?Sensory/Perceptual disturbances:    WNL  ?Orientation:  oriented to person, place, time/date, situation, day of week, month of year, year, and stated date of January 29, 2022  ?Attention:  Good  ?Concentration:  Good and Fair  ?Memory:  WNL  ?Fund of knowledge:   Good  ?Insight:    Good  ?Judgment:   Good  ?Impulse Control:  Good  ? ?Risk Assessment: ?Danger to Self:  No ?Self-injurious Behavior: No ?Danger to Others: No ?Duty to Warn:no ?Physical Aggression / Violence:No  ?Access to Firearms a concern: No  ?Gang Involvement:No  ? ?Subjective:  Patient in today reporting anxiety, depression, and some grief re: unexpected death of close uncle. Needed session today to share and process lots of thoughts and feelings regarding his death, some specific circumstances that have heavily impacted patient. Also having significant problem centipides at home where she moved into most recently "and that absolutely freaks me out". Very "fearful of them and concerned as to if and when the problem will be fixed." Did well in talking through a lot of her fears and anxieties and what she and her dad are doing to remedy the issue including working with exterminator to get house treated. Some family issues improving, still working on church issues and trying to feel connected. Attended small group at her church last night and "it went better than some prior occasions." Felt more connected and encouraged which felt  better. Wrestling with the thoughts of "is something wrong with me because of some of her anxiety and not feeling easy to be in new groups or if things don't go as planned." Less judging of herself and recognizing how this does not support her feeling less anxious and less depressed.  ? ?Interventions: Solution-Oriented/Positive Psychology and Insight-Oriented ?  ?Treatment goal plan:   ?Patient not signing tx plan on computer screen due to COVID. ?Treatment Goals: ?Goals will remain on tx plan as patient works with strategies to meet her goals.  Progress will be noted each session and documented in "Progress" section of Plan. ?Long term goal: ?Reduce overall level, frequency, and intensity of the anxiety so that daily functioning is not impaired. ?Short term goal: ?Increase understanding of beliefs and messages that produce anxiety, depression, or "worries". ?Strategy: ?Identify, challenge, and replace anxious/depressive/fearful self-talk with positive, realistic, and empowering self-talk. ?  ?Diagnosis: ?  ICD-10-CM   ?1. Generalized anxiety disorder  F41.1   ?  ? ?Plan: Patient today showing good motivation and participation in session focusing more on her depression and anxiety related to work, personal, parent, and other family relationships that are stressful.  Primary focus was on her grief issues regarding the unexpected death of a close uncle and a situation that has a lot of extenuating circumstances and unanswered questions.  Patient did well today in naming her concerns and processing her sadness, shock at his death, and some feelings she has in reference to unanswered questions. (Not all  details included in this note due to patient privacy needs.) ?As noted above, did feel much better in attending a recent smaller group at her church and she hopes those more positive feelings will continue for her.  Trying to keep "healthy space" and her relationship with her parents as she has recently gotten out into  her own home for the first time as a young adult but wanting to have healthier relationship with them. Encouraged patient in her practice of more positive behaviors including: Recognizing and reflecting daily on her progress, continue to refrain from taking on extra work shifts as this adds to her stress, believing in herself more in her ability to make significant changes, for every negative thought replace with 2 positives, being more open to meet new friends when she has the opportunity, consistent positive self talk, staying in touch with her small core group of friends, interrupting anxious/negative/depressive thoughts and challenging them to replace with more realistic and empowering thoughts that do not feed her anxiety nor depression, identifying herself in more positive ways, staying in the present focusing on what she can change, saying no when she needs to say no without guilt, allow for good sleep patterns, keeping her expectations realistic, staying in contact with supportive people, setting and keeping healthy boundaries, refraining from over personalizing the comments of other people, taking breaks as she can throughout the day and recognize the strength she shows working with goal-directed behaviors to move in a direction that supports her improved emotional health. ? ?Goal review and progress/challenges noted with patient. ? ?Next appointment within 3 weeks. ? ?This record has been created using [employer].  Chart creation errors have been sought, but may not always have been located and corrected.  Such creation errors do not reflect on the standard of medical care provided. ? ? ?Mathis Fare, LCSW ? ? ? ? ? ? ? ? ? ? ? ? ? ? ? ? ? ? ?

## 2022-02-12 ENCOUNTER — Ambulatory Visit (INDEPENDENT_AMBULATORY_CARE_PROVIDER_SITE_OTHER): Payer: Commercial Managed Care - PPO | Admitting: Psychiatry

## 2022-02-12 DIAGNOSIS — F411 Generalized anxiety disorder: Secondary | ICD-10-CM

## 2022-02-12 NOTE — Progress Notes (Signed)
?    [facility] Counselor/Therapist Progress Note ? ?Patient ID: Brianna Barr, MRN: [id number],   ? ?Date: 02/12/2022 ? ?Time Spent: 55 minutes  ? ?Treatment Type: Individual Therapy ? ?Reported Symptoms: anxiety, depression ? ?Mental Status Exam: ? ?Appearance:   Casual     ?Behavior:  Appropriate, Sharing, and Motivated  ?Motor:  Normal  ?Speech/Language:   Clear and Coherent  ?Affect:  anxious  ?Mood:  anxious  ?Thought process:  goal directed  ?Thought content:    overthinking  ?Sensory/Perceptual disturbances:    WNL  ?Orientation:  oriented to person, place, time/date, situation, day of week, month of year, year, and stated date of April 2023  ?Attention:  Good  ?Concentration:  Good  ?Memory:  WNL  ?Fund of knowledge:   Good  ?Insight:    Good  ?Judgment:   Good  ?Impulse Control:  Good  ? ?Risk Assessment: ?Danger to Self:  No ?Self-injurious Behavior: No ?Danger to Others: No ?Duty to Warn:no ?Physical Aggression / Violence:No  ?Access to Firearms a concern: No  ?Gang Involvement:No  ? ?Subjective:  Patient in today reporting that her depression is some better but anxiety is more persistent. Warmer weather impacts her mood more positively. Spending more time with her friend and baby. Also feels getting out and finding my own church is helping me personally and spiritually. Centipede problem at home has improved but has been very un-nerving. Several deaths recently that impacted her and her family, after the death of a close uncle which she processed last visit. Shared feeling she was having of "who's next?" After experiencing several close deaths. Needing session today to process her grief and her feelings of who's next and other mixed feelings. States "being Single is not fun" and talked through what all this statement means to her which was helpful for patient. Continues participation in small group at her newer church and it seems to be going better. Had met a gentleman and they talked several  weeks but he stopped responding with no reason given, which patient feels aggravated her anxiety and depression ? ?Interventions: Solution-Oriented/Positive Psychology, Ego-Supportive, and Insight-Oriented ? ?Treatment goal plan:   ?Patient not signing tx plan on computer screen due to [place]. ?Treatment Goals: ?Goals will remain on tx plan as patient works with strategies to meet her goals.  Progress will be noted each session and documented in "Progress" section of Plan. ?Long term goal: ?Reduce overall level, frequency, and intensity of the anxiety so that daily functioning is not impaired. ?Short term goal: ?Increase understanding of beliefs and messages that produce anxiety, depression, or "worries". ?Strategy: ?Identify, challenge, and replace anxious/depressive/fearful self-talk with positive, realistic, and empowering self-talk. ? ?Diagnosis: ?  ICD-10-CM   ?1. Generalized anxiety disorder  F41.1   ?  ? ?Plan:  Patient showing good motivation and actively participated in session working further on her anxiety and depression related to personal, work, parent and other family relationships that feed her stress.  Also processed some of her need to "get my own personal affairs in order as far as advanced planning issues etc." because I have learned from the recent test that we never know who could be next.  Seemed to appreciate talking through these issues today in session as she does not tend to talk to very many people about personal concerns.  Today she did quite well in naming her concerns, sharing her emotions, and also identifying together some of the specific needs for her in order to  move forward through her grief and also taking some responsibility for things she needs to do for herself.  Involvement at her new church is feeling some better as she meets people a little more.  Still working on having healthier space in relationship to her and her family. Encouraged patient to be practicing more positive  behaviors including: Continue to refrain from taking on extra work shifts as this adds to her stress and anxiety, recognizing and reflecting daily on her progress, believing in herself more and her ability to make significant changes, for every negative thought replace with 2 positives, being more open to meeting new friends when she has the opportunity, consistent positive self talk, staying in touch with her small core group of friends, interrupting anxious/negative/depressive thoughts and challenging them to replace with more realistic and empowering thoughts that do not feed her anxiety nor depression, identifying herself in more positive ways, staying in the present focusing on what she can change, saying no when she needs to say no without guilt, allow for good sleep patterns, keeping her expectations realistic, staying in contact with supportive people, setting and keeping healthy boundaries, refraining from over personalizing the comments of other people, taking breaks as she can throughout the day and realize the strength she shows working with goal directed behaviors to move in a direction that supports her improved emotional health and overall wellbeing. ? ?Goal review and progress/challenges noted with patient. ? ?Next appointment within 2 to 3 weeks. ? ?This record has been created using [employer].  Chart creation errors have been sought, but may not always have been located and corrected.  Such creation errors do not reflect on the standard of medical care provided. ? ? ?Shanon Ace, LCSW ? ? ? ? ? ? ? ? ? ? ? ? ? ? ? ? ? ? ?

## 2022-02-26 ENCOUNTER — Ambulatory Visit (INDEPENDENT_AMBULATORY_CARE_PROVIDER_SITE_OTHER): Payer: Commercial Managed Care - PPO | Admitting: Psychiatry

## 2022-02-26 DIAGNOSIS — F411 Generalized anxiety disorder: Secondary | ICD-10-CM | POA: Diagnosis not present

## 2022-02-26 NOTE — Progress Notes (Signed)
?    [facility] Counselor/Therapist Progress Note ? ?Patient ID: Brianna Barr, MRN: [id number],   ? ?Date: 02/26/2022 ? ?Time Spent: 55 minutes  ? ?Treatment Type: Individual Therapy ? ?Reported Symptoms: anxiety, depression, worrying ? ?Mental Status Exam: ? ?Appearance:   Casual     ?Behavior:  Appropriate, Sharing, and Motivated  ?Motor:  Normal  ?Speech/Language:   Clear and Coherent  ?Affect:  Depressed and anxious  ?Mood:  anxious and depressed  ?Thought process:  goal directed  ?Thought content:    Obsessions and overthinking  ?Sensory/Perceptual disturbances:    WNL  ?Orientation:  oriented to person, place, time/date, situation, day of week, month of year, year, and stated date of Feb 26, 2022  ?Attention:  Fair  ?Concentration:  Fair  ?Memory:  WNL  ?Fund of knowledge:   Good  ?Insight:    Good  ?Judgment:   Good  ?Impulse Control:  Good  ? ?Risk Assessment: ?Danger to Self:  No ?Self-injurious Behavior: No ?Danger to Others: No ?Duty to Warn:no ?Physical Aggression / Violence:No  ?Access to Firearms a concern: No  ?Gang Involvement:No  ? ?Subjective:  Patient in today reporting anxiety and depression and still on edge about being back at her home following multiple issues with centipides "which freaked me out", and actually has tried to plan more leisure/fun things in May. States it feels so weird to have multiple good things planned and I'm already starting to wonder "what's going to go wrong" versus letting herself truly look forward to the upcoming planned events. Worked on her tendency to always look for the negatives versus positives and admits this has been a long term problem; worked on intercepting her negative thoughts and replacing them with more realistic, encouraging thoughts. Does recognize some progress and gave some clear examples. Hard to hang on positives which she also worked on today, in addition to being overly self-critical. Does recognize some improvement with her depression  and some increase in her anxiety. Doing better with more recent family-related deaths. ? ?Interventions: Solution-Oriented/Positive Psychology and Insight-Oriented ? ?Treatment goal plan:   ?Patient not signing tx plan on computer screen due to COVID. ?Treatment Goals: ?Goals will remain on tx plan as patient works with strategies to meet her goals.  Progress will be noted each session and documented in "Progress" section of Plan. ?Long term goal: ?Reduce overall level, frequency, and intensity of the anxiety so that daily functioning is not impaired. ?Short term goal: ?Increase understanding of beliefs and messages that produce anxiety, depression, or "worries". ?Strategy: ?Identify, challenge, and replace anxious/depressive/fearful self-talk with positive, realistic, and empowering self-talk. ?  ?Diagnosis: ?  ICD-10-CM   ?1. Generalized anxiety disorder  F41.1   ?  ? ?Plan: Patient today motivated and actively participating in session as she worked on anxiety, depression, not assuming the negatives nor "looking for what might go wrong versus right".  Starting to see a little bit more improvement in that area but is very challenging for her due to past history of negative thought patterns.  Improvement in recognizing some of her negative behaviors and thoughts that trip her up and acknowledging how hard it is to change them, but seems to be holding onto some motivation to continue working at those changes.  Continues being involved at her new church which she reports is going better at this point.  Also continuing to establish some boundaries within her family that includes her having "some of my own space." Encouraged patient in  her practice of more positive behaviors including: Continue to refrain from taking on extra work shifts as this adds to her anxiety and stress, recognizing and reflecting daily on progress, believing herself more and her ability to make significant changes, interrupt negative thoughts as  they occur, being more open to meeting new friends when she has the opportunity, consistent positive self talk, staying in touch with her small core group of friends, interrupting anxious/negative/depressive thoughts and challenging them to replace with more realistic and empowering thoughts that do not feed her anxiety nor depression, identifying herself in more positive ways, staying in the present focusing on what she can change, saying no when she needs to say no without guilt, allow for good sleep patterns, keeping her expectations realistic, staying in contact with supportive people, taking breaks as she can throughout the day, and realize the strength she shows working with goal directed behaviors to move in a direction that supports her improved emotional health and overall outlook. ? ?Goal review and progress/challenges noted with patient. ? ?Next appointment within 2 to 3 weeks. ? ?This record has been created using [employer].  Chart creation errors have been sought, but may not always have been located and corrected.  Such creation errors do not reflect on the standard of medical care provided. ? ? ?Mathis Fare, LCSW ? ? ? ? ? ? ? ? ? ? ? ? ? ? ? ? ? ? ?

## 2022-03-12 ENCOUNTER — Ambulatory Visit (INDEPENDENT_AMBULATORY_CARE_PROVIDER_SITE_OTHER): Payer: Commercial Managed Care - PPO | Admitting: Psychiatry

## 2022-03-12 DIAGNOSIS — F411 Generalized anxiety disorder: Secondary | ICD-10-CM

## 2022-03-12 NOTE — Progress Notes (Signed)
?    [facility] Counselor/Therapist Progress Note ? ?Patient ID: Brianna Barr, MRN: [id number],   ? ?Date: 03/12/2022 ? ?Time Spent: 50 minutes  ? ?Treatment Type: Individual Therapy ? ?Reported Symptoms: anxiety, depression ? ?Mental Status Exam: ? ?Appearance:   Casual     ?Behavior:  Appropriate, Sharing, and Motivated  ?Motor:  Normal  ?Speech/Language:   Clear and Coherent  ?Affect:  Depressed and anxious  ?Mood:  anxious and depressed  ?Thought process:  goal directed  ?Thought content:    overthinking  ?Sensory/Perceptual disturbances:    WNL  ?Orientation:  oriented to person, place, time/date, situation, day of week, month of year, year, and stated date of Mar 12, 2022  ?Attention:  Good  ?Concentration:  Good and Fair  ?Memory:  WNL  ?Fund of knowledge:   Good  ?Insight:    Good  ?Judgment:   Good  ?Impulse Control:  Good  ? ?Risk Assessment: ?Danger to Self:  No ?Self-injurious Behavior: No ?Danger to Others: No ?Duty to Warn:no ?Physical Aggression / Violence:No  ?Access to Firearms a concern: No  ?Gang Involvement:No  ? ?Subjective: Patient in today reporting anxiety as stronger symptom and also depression. 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 Lots of stress anticipated in late May and into June with several events happening, including her birthday party this coming work. Frustrations getting coverage at work in order to attend a special event with her sister, and wasn't able to get the coverage so will need to leave event earlier. Still struggling with assuming worst case scenarios and "assuming things won't go as planned in a positive direction." Tested out those thoughts based on assuming the worst, and patient was able to see a couple recent situations that turned out fine but she had really stressed to the point of being exhausted beforehand and was excessively doubting herself. Really difficult for patient to do this and she worked hard in  session today to believe in herself being able to change her thought patterns but agrees to start with "first steps" as discussed in session today. ?Showing some improvement with her depression and anxiety although very gradual. ? ? Interventions: Cognitive Behavioral Therapy and Solution-Oriented/Positive Psychology ?  ?Treatment goal plan:   ?Patient not signing tx plan on computer screen due to COVID. ?Treatment Goals: ?Goals will remain on tx plan as patient works with strategies to meet her goals.  Progress will be noted each session and documented in "Progress" section of Plan. ?Long term goal: ?Reduce overall level, frequency, and intensity of the anxiety so that daily functioning is not impaired. ?Short term goal: ?Increase understanding of beliefs and messages that produce anxiety, depression, or "worries". ?Strategy: ?Identify, challenge, and replace anxious/depressive/fearful self-talk with positive, realistic, and empowering self-talk. ? ?Diagnosis: ?  ICD-10-CM   ?1. Generalized anxiety disorder  F41.1   ?  ? ?Plan: Patient showing good motivation and participation today as she focused more on her anxiety, depression, and tendency to see worst case scenarios versus what may happen in a positive direction.  Worked really hard on this today in session with the help of treatment goal directed behaviors with her long and short-term goal as noted above, and to continue this work in between sessions.  Also discussed some ways of more effectively letting go of old ways of handling situations that are not working well for her and trying to handle them differently with hopefully better outcomes.  Encouraged better  emotional self-care and better self understanding and not having extremely high expectations, treating herself like she would a really good friend.  Trying not to assume the negatives, and noticing even small amounts of improvement that she may feel in changing her negative thoughts to more positive  and realistic thoughts.  Continue having good boundaries with others so as to maintain.  Encouraged patient to be practicing positive behaviors discussed in sessions including: Reflect on progress she makes often, continue to refrain from taking on extra work shifts as this adds to her anxiety and stress, believing in herself more and her ability to make significant changes which she has already begun, interrupt negative thoughts as they occur, being more open to meeting new friends when she has the opportunity, consistent positive self talk, staying in touch with her small core group of friends, interrupting anxious/negative/depressive thoughts and challenging them to replace with more realistic and empowering thoughts that do not feed her anxiety nor depression, identifying herself in more positive ways, stay focused in the present on what she can change, saying no when she needs to say no without guilt, allow for good sleep patterns, keeping her expectations realistic, staying in contact with supportive people, take breaks as she can throughout the day or evening, and recognize the strength she shows working with goal directed behaviors to move in a direction that supports her improved emotional health, self-confidence, and overall wellbeing. ? ?Goal review and progress/challenges noted with patient. ? ?Next appointment within 2 to 3 weeks. ? ?This record has been created using [employer].  Chart creation errors have been sought, but may not always have been located and corrected.  Such creation errors do not reflect on the standard of medical care provided. ? ? ?Mathis Fare, LCSW ? ? ? ? ? ? ? ? ? ? ? ? ? ? ? ? ? ? ?

## 2022-03-26 ENCOUNTER — Ambulatory Visit (INDEPENDENT_AMBULATORY_CARE_PROVIDER_SITE_OTHER): Payer: Commercial Managed Care - PPO | Admitting: Psychiatry

## 2022-03-26 DIAGNOSIS — F411 Generalized anxiety disorder: Secondary | ICD-10-CM | POA: Diagnosis not present

## 2022-03-26 NOTE — Progress Notes (Signed)
[facility] Counselor/Therapist Progress Note  Patient ID: Brianna Barr, MRN: [id number],    Date: 03/26/2022  Time Spent: 55 minutes   Treatment Type: Individual Therapy  Reported Symptoms: anxiety, depression   Mental Status Exam:  Appearance:   Casual     Behavior:  Appropriate and Sharing  Motor:  Normal  Speech/Language:   Clear and Coherent  Affect:  Depressed and anxious  Mood:  anxious and depressed  Thought process:  goal directed  Thought content:    Obsessive thoughts and overthinking  Sensory/Perceptual disturbances:    WNL  Orientation:  oriented to person, place, time/date, situation, day of week, month of year, year, and stated date of Mar 26, 2022  Attention:  Good  Concentration:  Good and Fair  Memory:  WNL  Fund of knowledge:   Good  Insight:    Good  Judgment:   Good  Impulse Control:  Good   Risk Assessment: Danger to Self:  No Self-injurious Behavior: No Danger to Others: No Duty to Warn:no Physical Aggression / Violence:No  Access to Firearms a concern: No  Gang Involvement:No   Subjective: Patient in today reporting anxiety and depression, some better at times. Started sharing her ongoing tendency to look more for negatives versus positives. States today this was true even when "I was in 3rd grade and we moved to [place], after several other moves in her early years. " "I specifically recall my social anxiety as I was the new person and all the other kids knew each other." Tries to laugh it off but ends up in tears. Feels like it was the start of a long struggle that I still have which is: "not advocating for myself, not having more friendships,being alone more, low self esteem, poor body image, anxiety about other people's perception of me." Processed this at length today, including some of her self-negating assumptions and beliefs about herself that she is really needing to let go of, in order to move forward.  Patient did well with this  difficult subject matter today and was very appreciative at end of session.  She worked hard even though it was hard for her but I felt like really opened more doors for herself and to be able to talk much more openly in sessions about her personal issues, not fitting in, especially how she sees how her history since third grade has led up to a lot of the feelings she experiences today that are very troubling for her.  Still catches herself assuming worst case scenarios and continues to work on this along with trying to be more positive and look for positives versus negatives.  We will follow up again on this next session as we ran out of time today.  Showing gradual progress and this is encouraging for her.  Interventions: Solution-Oriented/Positive Psychology and Ego-Supportive  Treatment goal plan:   Patient not signing tx plan on computer screen due to [place]. Treatment Goals: Goals will remain on tx plan as patient works with strategies to meet her goals.  Progress will be noted each session and documented in "Progress" section of Plan. Long term goal: Reduce overall level, frequency, and intensity of the anxiety so that daily functioning is not impaired. Short term goal: Increase understanding of beliefs and messages that produce anxiety, depression, or "worries". Strategy: Identify, challenge, and replace anxious/depressive/fearful self-talk with positive, realistic, and empowering self-talk.  Diagnosis:   ICD-10-CM   1. Generalized anxiety disorder  F41.1  Plan:  Patient today showing good participation and motivation as she worked today on some past lengthy history of low self-esteem and feeling badly about herself.  As noted above, since approximately third grade, patient shares today how those negative feelings about herself have been influencing her along the way limiting friendships and limiting her ability to feel good about herself which continues today.  A lot of what she  shared helped explain some of her current behaviors and concerns and she seemed very relieved that she finally shared some of the history behind her concerns, and about which we will continue to work on in sessions.  The tearfulness had stopped by session end and she seemed to feel more encouraged. Encouraged patient in her practice of more positive behaviors as discussed in sessions including: Continue to refrain from taking on extra work shifts as this adds to her anxiety and stress, reflect on her progress often, believing herself more and her ability to make significant changes, interrupt negative thoughts as they occur, being more open to meeting new friends when she has the opportunity, consistent positive self talk, staying in touch with her small core group of friends, interrupting anxious/negative/depressive thoughts and challenging them to replace with more realistic and empowering thoughts that do not feed her anxiety nor depression, identifying herself in more positive ways, stay focused in the present, saying no when she needs to say no without guilt, allow for good sleep patterns, keeping her expectations realistic, staying in contact with supportive people, taking breaks as she can throughout the day or evening, and realize the strength she shows working with goal directed behaviors to move in a direction that supports her improved emotional health, her self confidence, and good decision making.  Goal review and progress/challenges noted with patient.  Next appointment within 2 to 3 weeks.  This record has been created using [employer].  Chart creation errors have been sought, but may not always have been located and corrected.  Such creation errors do not reflect on the standard of medical care provided.   Shanon Ace, LCSW

## 2022-04-09 ENCOUNTER — Ambulatory Visit: Payer: Commercial Managed Care - PPO | Admitting: Psychiatry

## 2022-04-23 ENCOUNTER — Ambulatory Visit: Payer: Commercial Managed Care - PPO | Admitting: Psychiatry

## 2022-04-28 ENCOUNTER — Ambulatory Visit: Payer: Commercial Managed Care - PPO | Admitting: Adult Health

## 2022-05-07 ENCOUNTER — Ambulatory Visit (INDEPENDENT_AMBULATORY_CARE_PROVIDER_SITE_OTHER): Payer: 59 | Admitting: Psychiatry

## 2022-05-07 ENCOUNTER — Other Ambulatory Visit: Payer: Self-pay | Admitting: Family Medicine

## 2022-05-07 ENCOUNTER — Other Ambulatory Visit (HOSPITAL_BASED_OUTPATIENT_CLINIC_OR_DEPARTMENT_OTHER): Payer: Self-pay

## 2022-05-07 ENCOUNTER — Ambulatory Visit (INDEPENDENT_AMBULATORY_CARE_PROVIDER_SITE_OTHER): Payer: 59 | Admitting: Adult Health

## 2022-05-07 ENCOUNTER — Encounter: Payer: Self-pay | Admitting: Adult Health

## 2022-05-07 DIAGNOSIS — J302 Other seasonal allergic rhinitis: Secondary | ICD-10-CM

## 2022-05-07 DIAGNOSIS — F410 Panic disorder [episodic paroxysmal anxiety] without agoraphobia: Secondary | ICD-10-CM

## 2022-05-07 DIAGNOSIS — F411 Generalized anxiety disorder: Secondary | ICD-10-CM

## 2022-05-07 DIAGNOSIS — F331 Major depressive disorder, recurrent, moderate: Secondary | ICD-10-CM

## 2022-05-07 DIAGNOSIS — G4700 Insomnia, unspecified: Secondary | ICD-10-CM | POA: Diagnosis not present

## 2022-05-07 MED ORDER — MONTELUKAST SODIUM 10 MG PO TABS
10.0000 mg | ORAL_TABLET | Freq: Every day | ORAL | 0 refills | Status: DC
  Filled 2022-05-07: qty 90, 90d supply, fill #0

## 2022-05-07 MED ORDER — PROPRANOLOL HCL 10 MG PO TABS
10.0000 mg | ORAL_TABLET | Freq: Three times a day (TID) | ORAL | 3 refills | Status: DC
  Filled 2022-05-07: qty 270, 90d supply, fill #0

## 2022-05-07 MED ORDER — ESCITALOPRAM OXALATE 20 MG PO TABS
20.0000 mg | ORAL_TABLET | Freq: Every day | ORAL | 3 refills | Status: DC
  Filled 2022-05-07: qty 90, 90d supply, fill #0

## 2022-05-07 MED ORDER — HYDROXYZINE HCL 25 MG PO TABS
25.0000 mg | ORAL_TABLET | Freq: Three times a day (TID) | ORAL | 3 refills | Status: DC | PRN
  Filled 2022-05-07: qty 270, 90d supply, fill #0

## 2022-05-07 MED ORDER — BUPROPION HCL ER (XL) 150 MG PO TB24
450.0000 mg | ORAL_TABLET | Freq: Every morning | ORAL | 3 refills | Status: DC
  Filled 2022-05-07: qty 270, 90d supply, fill #0

## 2022-05-07 NOTE — Progress Notes (Signed)
Brianna Barr [id number] 1996-08-29 26 y.o.  Subjective:   Patient ID:  Brianna Barr is a 26 y.o. (DOB [birth year]) female.  Chief Complaint: No chief complaint on file.   HPI Brianna Barr presents to the office today for follow-up of MDD, GAD, panic attacks, and insomnia.  Describes mood today as "ok". Pleasant. Tearful at times. Mood symptoms - reports depression, anxiety, and irritability. Decreased panic attacks. Reports worry and rumination. Stating "I don't feel like I'm doing well". Has been without insurance and medications for 6 weeks - would like to restart. Stable interest and motivation. Taking medications as prescribed. Seeing therapist bi-weekly - Rockne Menghini. Energy levels "ok". Active, has a regular exercise routine. Walking 10 to 15,000 steps a day at work. Enjoys some usual interests and activities. Single. Lives alone. Parents and 2 younger sisters local. Spending time with family. Has a core group of 3 friends. Appetite adequate. Weight single. Sleeps better some nights than others. Averages 4 to 7 hours - feels more rested. Focus and concentration "ok" Completing tasks. Managing aspects of household. Works as a Engineer, civil (consulting) at [employer].  Denies SI or HI.  Denies AH or VH.  Previous medication trials: Zoloft in college, Viibryd, Wellbutrin    GAD-7    Flowsheet Row Office Visit from 06/25/2020 in [facility] from 11/03/2019 in [facility] from 09/29/2019 in [facility] from 08/18/2019 in [facility] from 07/07/2019 in [facility] [place]  Total GAD-7 Score 18 7 17 19 14       PHQ2-9    Flowsheet Row Office Visit from 12/19/2021 in [facility] Office Visit from 11/13/2021 in [facility] [place] from 06/25/2020 in [facility] [place] from 11/03/2019 in [facility] [place] from 09/29/2019 in [facility] [place] Total Score 3 4 4 3 4   PHQ-9 Total Score 11 15 16 9 12       Flowsheet Row ED from 06/06/2021 in [facility] from 03/14/2021 in [facility] RISK CATEGORY No Risk No Risk        Review of Systems:  Review of Systems  Musculoskeletal:  Negative for gait problem.  Neurological:  Negative for tremors.  Psychiatric/Behavioral:         Please refer to HPI    Medications: I have reviewed the patient's current medications.  Current Outpatient Medications  Medication Sig Dispense Refill   albuterol (VENTOLIN HFA) 108 (90 Base) MCG/ACT inhaler Inhale 2 puffs into the lungs every 6 (six) hours as needed for wheezing or shortness of breath. 6.7 g 1   buPROPion (WELLBUTRIN XL) 150 MG 24 hr tablet TAKE THREE TABLETS EVERY MORNING. 270 tablet 3   cyclobenzaprine (FLEXERIL) 5 MG tablet Take 1 tablet (5 mg total) by mouth at bedtime as needed for muscle spasms. 30 tablet 1   escitalopram (LEXAPRO) 20 MG tablet Take 1 tablet (20 mg total) by mouth daily. 90 tablet 3   hydrOXYzine (ATARAX) 25 MG tablet Take 1 tablet (25 mg total) by mouth 3 (three) times daily as needed. 270 tablet 3   levonorgestrel (MIRENA) 20 MCG/DAY IUD 1 each by Intrauterine route once.     linaclotide (LINZESS) 145 MCG CAPS capsule Take 1 capsule (145 mcg total) by mouth daily before breakfast.  90 capsule 3   montelukast (SINGULAIR) 10 MG tablet Take 1 tablet (10 mg total) by mouth at bedtime. (Patient not taking: Reported on 01/14/2022) 90 tablet 0   oxybutynin (DITROPAN-XL) 10 MG 24 hr tablet Take 1 tablet (10 mg total) by mouth daily. (Patient not taking: Reported on 01/14/2022) 30 tablet 5   propranolol (INDERAL) 10 MG tablet Take 1 tablet (10 mg total) by mouth 3 (three) times daily. 270 tablet 3   Vitamin D, Ergocalciferol, (DRISDOL)  1.25 MG (50000 UNIT) CAPS capsule Take 1 capsule (50,000 Units total) by mouth every 7 (seven) days. 12 capsule 0   No current facility-administered medications for this visit.    Medication Side Effects: None  Allergies: No Known Allergies  Past Medical History:  Diagnosis Date   Anxiety    Asthma    Depression    Overactive bladder    Stress incontinence     Past Medical History, Surgical history, Social history, and Family history were reviewed and updated as appropriate.   Please see review of systems for further details on the patient's review from today.   Objective:   Physical Exam:  There were no vitals taken for this visit.  Physical Exam Constitutional:      General: She is not in acute distress. Musculoskeletal:        General: No deformity.  Neurological:     Mental Status: She is alert and oriented to person, place, and time.     Coordination: Coordination normal.  Psychiatric:        Attention and Perception: Attention and perception normal. She does not perceive auditory or visual hallucinations.        Mood and Affect: Mood normal. Mood is not anxious or depressed. Affect is not labile, blunt, angry or inappropriate.        Speech: Speech normal.        Behavior: Behavior normal.        Thought Content: Thought content normal. Thought content is not paranoid or delusional. Thought content does not include homicidal or suicidal ideation. Thought content does not include homicidal or suicidal plan.        Cognition and Memory: Cognition and memory normal.        Judgment: Judgment normal.     Comments: Insight intact     Lab Review:     Component Value Date/Time   NA 137 11/13/2021 0940   K 4.2 11/13/2021 0940   CL 103 11/13/2021 0940   CO2 27 11/13/2021 0940   GLUCOSE 113 (H) 11/13/2021 0940   BUN 13 11/13/2021 0940   CREATININE 0.81 11/13/2021 0940   CALCIUM 9.1 11/13/2021 0940   PROT 6.6 04/11/2021 0745   ALBUMIN 3.9 04/11/2021 0745   AST 9  04/11/2021 0745   ALT 10 04/11/2021 0745   ALKPHOS 76 04/11/2021 0745   BILITOT 0.3 04/11/2021 0745       Component Value Date/Time   WBC 11.1 (H) 11/13/2021 0940   RBC 4.79 11/13/2021 0940   HGB 12.8 11/13/2021 0940   HCT 39.6 11/13/2021 0940   PLT 336.0 11/13/2021 0940   MCV 82.7 11/13/2021 0940   MCH 23.4 (L) 10/03/2020 1146   MCHC 32.4 11/13/2021 0940   RDW 14.0 11/13/2021 0940   LYMPHSABS 2.7 11/13/2021 0940   MONOABS 0.6 11/13/2021 0940   EOSABS 0.2 11/13/2021 0940   BASOSABS 0.1 11/13/2021 0940    No results found for: "POCLITH", "LITHIUM"   No results found for: "  PHENYTOIN", "PHENOBARB", "VALPROATE", "CBMZ"   .res Assessment: Plan:    Plan:  PDMP reviewed  Hydroxyzine 25mg  - 1 to 2 at bedtime. Wellbutrin XL 450mg  every morning. Denies seizure history. Increase Propranolol 10mg  BID to TID Increase Lexapro 10mg  daily  RTC 3 months  Time spent with patient was 25 minutes. Greater than 50% of face to face time with patient was spent on counseling and coordination of care.      Patient advised to contact office with any questions, adverse effects, or acute worsening in signs and symptoms.   RTC 4 weeks Diagnoses and all orders for this visit:  Panic attacks  Generalized anxiety disorder -     propranolol (INDERAL) 10 MG tablet; Take 1 tablet (10 mg total) by mouth 3 (three) times daily. -     hydrOXYzine (ATARAX) 25 MG tablet; Take 1 tablet (25 mg total) by mouth 3 (three) times daily as needed. -     escitalopram (LEXAPRO) 20 MG tablet; Take 1 tablet (20 mg total) by mouth daily.  Major depressive disorder, recurrent episode, moderate (HCC) -     buPROPion (WELLBUTRIN XL) 150 MG 24 hr tablet; TAKE THREE TABLETS EVERY MORNING.  Insomnia, unspecified type     Please see After Visit Summary for patient specific instructions.  Future Appointments  Date Time Provider Department Center  05/07/2022  9:00 AM , LCSW [facility] None  05/21/2022  9:00  AM , LCSW [facility] None  06/04/2022  9:00 AM Mathis Fare, LCSW [facility] None  06/05/2022 10:00 AM Mathis Fare, MD [id number]  06/18/2022  9:00 AM Mathis Fare, LCSW [facility] None    No orders of the defined types were placed in this encounter.   -------------------------------

## 2022-05-07 NOTE — Progress Notes (Signed)
[facility] Counselor/Therapist Progress Note  Patient ID: Brianna Barr, MRN: [id number],    Date: 05/07/2022  Time Spent: 55 minutes   Treatment Type: Individual Therapy  Reported Symptoms: anxiety, depression, tearfulness  Mental Status Exam:  Appearance:   Casual     Behavior:  Appropriate, Sharing, and Motivated  Motor:  Normal  Speech/Language:   Clear and Coherent  Affect:  Depressed and anxious , tearful  Mood:  anxious and depressed  Thought process:  goal directed  Thought content:    Obsessions and overthinking  Sensory/Perceptual disturbances:    WNL  Orientation:  oriented to person, place, time/date, situation, day of week, month of year, year, and stated date of May 07, 2022  Attention:  Fair/Good  Concentration:  Fair  Memory:  WNL  Fund of knowledge:   Good  Insight:    Good  Judgment:   Good  Impulse Control:  Good   Risk Assessment: Danger to Self:  No Self-injurious Behavior: No Danger to Others: No Duty to Warn:no Physical Aggression / Violence:No  Access to Firearms a concern: No  Gang Involvement:No   Subjective:  Patient in today reporting anxiety as strongest symptom along with depression. Had to miss her last appt due to insurance change and that was quite difficult for patient "trying to figure it out". Struggling with being short-staffed. Needed session today to share and work on some increased stress affecting her level of anxiety and depression. Tearfully shared that she has needed to work some extra shifts recently to help with unexpected expenses. Problems have continues some after moving into her townhome (HVAC) which meant more expenses which has been stressful. Looking some at "my positives" including my family, my friends, church. Realizing her need to have a little more downtime and not continue taking on extra shifts at work which she had stopped for a while.  Insight into some of her stress and the various causes, is  improving some for patient.  Realizing more her need for healthy boundaries and discussed today some ways of prioritizing and setting healthier limits.  Needing to continue some work in future session on issue brought up previously in reference to self-esteem and self negating and its early roots back when she was in school, and some of the ways that has repeated itself for her as a young adult.  Interventions: Cognitive Behavioral Therapy  Treatment goal plan:   Patient not signing tx plan on computer screen due to COVID. Treatment Goals: Goals will remain on tx plan as patient works with strategies to meet her goals.  Progress will be noted each session and documented in "Progress" section of Plan. Long term goal: Reduce overall level, frequency, and intensity of the anxiety so that daily functioning is not impaired. Short term goal: Increase understanding of beliefs and messages that produce anxiety, depression, or "worries". Strategy: Identify, challenge, and replace anxious/depressive/fearful self-talk with positive, realistic, and empowering self-talk.   Diagnosis:   ICD-10-CM   1. Generalized anxiety disorder  F41.1      Plan: Patient in today showing good motivation and participation as she focused on her anxiety and depression and the need for improved self-care.  Discussed boundaries, setting healthier limits, seeing herself as a good and valued person, and better understanding of why she needs to be able to say "no" on some occasions.  History of some very low self-esteem feeds some of these issues and patient is working to progress beyond this and  feel a stronger sense of self, which is a work in progress.  Encouraged her to try and get more regular sleep time as her job does involve some long hours with some occasional extra hours.  States she also wants to get back involved in her church which due to work hours and some unexpected household repairs, she has missed more  recently. Encouraged patient in her practice of more positive behaviors including: Continuing to refrain from taking on extra work shifts as this adds to her anxiety and stress, reflect on her progress often, believing herself and her ability to make significant changes, interrupt negative thoughts as they occur, being more open to meeting new friends when she has the opportunity, consistent positive self talk, staying in touch with her small core group of friends, interrupting anxious/negative/depressive thoughts and challenge them to replace with more realistic and empowering thoughts that do not feed her anxiety nor depression, identifying herself in more positive ways, stay focused in the present, saying no when she needs to say no without guilt, allow for good sleep patterns, keeping her expectations realistic, staying in contact with supportive people, taking breaks as she can throughout the day or evening, and recognize the strength she shows working with goal directed behaviors to move in a direction that supports her improved emotional health  Goal review and progress/challenges noted with patient.  Next appointment within 4 weeks.  This record has been created using [employer].  Chart creation errors have been sought, but may not always have been located and corrected.  Such creation errors do not reflect on the standard of medical care provided.   Mathis Fare, LCSW

## 2022-05-08 ENCOUNTER — Other Ambulatory Visit (HOSPITAL_BASED_OUTPATIENT_CLINIC_OR_DEPARTMENT_OTHER): Payer: Self-pay

## 2022-05-21 ENCOUNTER — Ambulatory Visit: Payer: 59 | Admitting: Psychiatry

## 2022-05-21 DIAGNOSIS — F411 Generalized anxiety disorder: Secondary | ICD-10-CM

## 2022-05-21 NOTE — Progress Notes (Signed)
[facility] Counselor/Therapist Progress Note  Patient ID: Brianna Barr, MRN: [id number],    Date: 05/21/2022  Time Spent: 55 minutes   Treatment Type: Individual Therapy  Reported Symptoms: anxiety, depression  Mental Status Exam:  Appearance:   Casual     Behavior:  Appropriate, Sharing, and Motivated  Motor:  Normal  Speech/Language:   Clear and Coherent  Affect:  anxious  Mood:  anxious  Thought process:  goal directed  Thought content:    Some obsessive thoughts and overthinking  Sensory/Perceptual disturbances:    WNL  Orientation:  oriented to person, place, time/date, situation, day of week, month of year, year, and stated date of May 21, 2022  Attention:  Good  Concentration:  Good  Memory:  WNL  Fund of knowledge:   Good  Insight:    Good  Judgment:   Good  Impulse Control:  Good   Risk Assessment: Danger to Self:  No Self-injurious Behavior: No Danger to Others: No Duty to Warn:no Physical Aggression / Violence:No  Access to Firearms a concern: No  Gang Involvement:No   Subjective: Patient in today and states anxiety is  her stronger symptom. Also some depression. Short staffed at work but still trying to avoid taking extra shifts for her own health.Backed off her church group activities. Issues at another church MH that felt hurtful and doesn't want to revisit there. Discussed more of her "church issues" and decision-making. Recent bridal shower for sister led to "lots of stress and aggravate some of her anxiety and depression. Shared more of how some dysfunctional past behaviors and ways of thinking.  Discussed further steps that she could be comfortable with in better self-care, challenging herself to look at certain situations and behaviors in different ways and be willing to practice different behaviors which has been very difficult for her.  She is to practice in between sessions a couple of the changes that we spoke about today related to "long  time habits of seeing herself in negative ways "and always need to be "prepared for what might go wrong".  Reviewed some of her history of difficult periods of time in her life and how strongly she feels that has influenced her, which we talked about more today and we will continue next session.  Interventions: Cognitive Behavioral Therapy  Treatment Goals: Goals will remain on tx plan as patient works with strategies to meet her goals.  Progress will be noted each session and documented in "Progress" section of Plan. Long term goal: Reduce overall level, frequency, and intensity of the anxiety so that daily functioning is not impaired. Short term goal: Increase understanding of beliefs and messages that produce anxiety, depression, or "worries". Strategy: Identify, challenge, and replace anxious/depressive/fearful self-talk with positive, realistic, and empowering self-talk.  Diagnosis:   ICD-10-CM   1. Generalized anxiety disorder  F41.1      Plan: Patient today showing good motivation and participation with continued working on trying to set herself up for more success versus continuing some long time habits of seeing herself negatively and not recognizing her strengths.  Further discussion of boundary issues over the years as well as setting healthier limits in other ways and seeing herself as being a good person.  Wanting to have a stronger sense of self now as an adult.  Better understanding of how some very difficult times in her past even as a young child and growing up, have contributed to some of the rigidity she experiences  and ways of thinking and feeling about certain things that do not serve her well in working on her current goals of decreasing her worrying and anxiety and eventually feeling more self-confident and empowered. Encouraged patient in practicing more positive behaviors including: Refraining from taking on extra work shifts as this adds to her stress and anxiety,  reflecting her progress often, believe in herself and her ability to make significant changes, interrupt negative thoughts as they occur and be able to challenge and replace with more reality based thoughts, that do not feed her anxiety and depression, being more open to meeting new friends when she has the opportunity, consistent positive self talk, staying in touch with her small core group of friends, identifying herself in more positive ways, remain in the present versus the past or too far into the future, saying no when she needs to say no without guilt, allow for good sleep patterns, staying in contact with supportive people, take breaks as she can throughout the day or evening, and realize the strength she shows working with goal directed behaviors to move in a direction that supports her improved emotional health and overall wellbeing.  Goal review and progress/challenges noted with patient.  Next appointment within 2 to 3 weeks.  This record has been created using [employer].  Chart creation errors have been sought, but may not always have been located and corrected.  Such creation errors do not reflect on the standard of medical care provided.   Mathis Fare, LCSW

## 2022-06-04 ENCOUNTER — Ambulatory Visit (INDEPENDENT_AMBULATORY_CARE_PROVIDER_SITE_OTHER): Payer: 59 | Admitting: Psychiatry

## 2022-06-04 DIAGNOSIS — F411 Generalized anxiety disorder: Secondary | ICD-10-CM | POA: Diagnosis not present

## 2022-06-04 NOTE — Progress Notes (Signed)
[facility] Counselor/Therapist Progress Note  Patient ID: Brianna Barr, MRN: [id number],    Date: 06/04/2022  Time Spent: 55 minutes   Treatment Type: Individual Therapy  Reported Symptoms: anxiety, depression, history of unresolved hurt, bullying, anger, negative self-talk,worthlessness, low self esteem, guilt, pressure in her role as "daughter/sister/friend", social awkwardness, difficulty in friendships/relationships, failure and rejection, early childhood perfectionism that has continued.  Mental Status Exam:  Appearance:   Casual     Behavior:  Appropriate, Sharing, and Motivated  Motor:  Normal  Speech/Language:   Clear and Coherent  Affect:  Depressed, anxious  Mood:  anxious and depressed  Thought process:  goal directed  Thought content:    Obsessions (thoughts)  Sensory/Perceptual disturbances:      Orientation:  oriented to person, place, time/date, situation, day of week, month of year, year, and stated date of June 04, 2022  Attention:  Good  Concentration:  Good  Memory:  WNL  Fund of knowledge:   Good  Insight:    Good  Judgment:   Good  Impulse Control:  Good   Risk Assessment: Danger to Self:  No Self-injurious Behavior: No Danger to Others: No Duty to Warn:no Physical Aggression / Violence:No  Access to Firearms a concern: No  Gang Involvement:No   Subjective: Patient in today taking more risks in talking more about her past and taking risks to be more open that she has been before. States her depression is "not as bad and this makes it easier for me to speak up and share more." Shared and processed some of her painful past, which was difficult for patient but she acknowledges that she wants to be able to heal from earlier experiences and the "leftover effects." Finding as she talks, she is tearful but also grateful to be able to be more open today, but also "overwhelmed to be trying to deal with these things from the past." Focused more today  on her earlier years of failing her grade and parents intervening to get her moved on up to next grade. "Made me take speech therapy because they thought I couldn't talk but reality was that I didn't talk due my shyness/anxiety/fears of speaking up." Remembering having to be tested "to see what was wrong with me."Further processed more of the testing she went through. Previous memories of being so anxious and very disconnected with other kids,"I didn't have a friend until end of 3rd grade." "Lots of trust issues." To pick up with this more next session as we ran out of time today. Encouraged her in her strength and determination shown today. To pick up and continue at next session with goal directed behaviors.  Interventions: Cognitive behavioral therapy  Treatment Goals: Goals will remain on tx plan as patient works with strategies to meet her goals.  Progress will be noted each session and documented in "Progress" section of Plan. Long term goal: Reduce overall level, frequency, and intensity of the anxiety so that daily functioning is not impaired. Short term goal: Increase understanding of beliefs and messages that produce anxiety, depression, or "worries". Strategy: Identify, challenge, and replace anxious/depressive/fearful self-talk with positive, realistic, and empowering self-talk  Diagnosis:   ICD-10-CM   1. Generalized anxiety disorder  F41.1      Plan: Patient today showing increased motivation and she continues to show improved motivation throughout session taking some rest to talk about sensitive things she had not been able to talk about very deeply.  Expressed a lot  of deep and meaningful thoughts as she unpacked some situations from her history that have helped her stay "stuck" from moving forward.  Did seem to feel some better about herself towards the end of session and expresses a willingness to continue working on some of her issues from the past that she has been reluctant to  talk about for fear that "I could not handle them very well".  Does also express that the more we have talked, she has felt that if she was able to deal with these more hidden issues that things could possibly become better for her, have less depression and anxiety, and hopefully into her future have more relationships that are healthy for her.  Wants to stop some of her longtime ways of thinking that have held her back and prevented her from having to hide a lot. Encouraged patient in her practice of positive behaviors including: Believing in herself and her ability to make significant changes, refraining from taking on extra work shifts as this adds to her stress/anxiety, reflect on progress she has made, interrupt negative thoughts as they occur and be able to challenge and replace with more reality-based thoughts that do not feed her anxiety and depression, being more open to meeting new friends when she has the opportunity, consistent positive self talk, staying in touch with her small core group of friends, identifying herself in more positive ways, remain in the present versus the past or too far into the future, saying no when she needs to say no without guilt, allow for good sleeping patterns, staying in contact with supportive people, take breaks as she can throughout the day or evening, and recognize the strength she shows working with goal directed behaviors to move in a direction that supports her improved emotional health and overall wellbeing.  Goal review and progress/challenges noted with patient.  Next appointment within 2 to 3 weeks.  This record has been created using [employer].  Chart creation errors have been sought, but may not always have been located and corrected.  Such creation errors do not reflect on the standard of medical care provided.   Mathis Fare, LCSW

## 2022-06-05 ENCOUNTER — Other Ambulatory Visit (HOSPITAL_BASED_OUTPATIENT_CLINIC_OR_DEPARTMENT_OTHER): Payer: Self-pay

## 2022-06-05 ENCOUNTER — Other Ambulatory Visit: Payer: Self-pay | Admitting: Family Medicine

## 2022-06-05 ENCOUNTER — Ambulatory Visit (INDEPENDENT_AMBULATORY_CARE_PROVIDER_SITE_OTHER): Payer: 59 | Admitting: Family Medicine

## 2022-06-05 VITALS — BP 122/80 | HR 68 | Temp 98.3°F | Ht 62.0 in | Wt 244.4 lb

## 2022-06-05 DIAGNOSIS — F321 Major depressive disorder, single episode, moderate: Secondary | ICD-10-CM

## 2022-06-05 DIAGNOSIS — Z6841 Body Mass Index (BMI) 40.0 and over, adult: Secondary | ICD-10-CM | POA: Diagnosis not present

## 2022-06-05 DIAGNOSIS — E559 Vitamin D deficiency, unspecified: Secondary | ICD-10-CM

## 2022-06-05 DIAGNOSIS — J4520 Mild intermittent asthma, uncomplicated: Secondary | ICD-10-CM

## 2022-06-05 DIAGNOSIS — Z0000 Encounter for general adult medical examination without abnormal findings: Secondary | ICD-10-CM

## 2022-06-05 DIAGNOSIS — F411 Generalized anxiety disorder: Secondary | ICD-10-CM

## 2022-06-05 DIAGNOSIS — Z862 Personal history of diseases of the blood and blood-forming organs and certain disorders involving the immune mechanism: Secondary | ICD-10-CM | POA: Diagnosis not present

## 2022-06-05 LAB — T4, FREE: Free T4: 0.94 ng/dL (ref 0.60–1.60)

## 2022-06-05 LAB — CBC WITH DIFFERENTIAL/PLATELET
Basophils Absolute: 0 10*3/uL (ref 0.0–0.1)
Basophils Relative: 0.6 % (ref 0.0–3.0)
Eosinophils Absolute: 0.1 10*3/uL (ref 0.0–0.7)
Eosinophils Relative: 0.9 % (ref 0.0–5.0)
HCT: 41.9 % (ref 36.0–46.0)
Hemoglobin: 13.8 g/dL (ref 12.0–15.0)
Lymphocytes Relative: 25.4 % (ref 12.0–46.0)
Lymphs Abs: 2.2 10*3/uL (ref 0.7–4.0)
MCHC: 33 g/dL (ref 30.0–36.0)
MCV: 83.3 fl (ref 78.0–100.0)
Monocytes Absolute: 0.4 10*3/uL (ref 0.1–1.0)
Monocytes Relative: 4.4 % (ref 3.0–12.0)
Neutro Abs: 6 10*3/uL (ref 1.4–7.7)
Neutrophils Relative %: 68.7 % (ref 43.0–77.0)
Platelets: 322 10*3/uL (ref 150.0–400.0)
RBC: 5.03 Mil/uL (ref 3.87–5.11)
RDW: 13.5 % (ref 11.5–15.5)
WBC: 8.7 10*3/uL (ref 4.0–10.5)

## 2022-06-05 LAB — LIPID PANEL
Cholesterol: 190 mg/dL (ref 0–200)
HDL: 47.7 mg/dL (ref >39.00)
LDL Cholesterol: 132 mg/dL — ABNORMAL HIGH (ref 0–99)
NonHDL: 141.87
Total CHOL/HDL Ratio: 4
Triglycerides: 48 mg/dL (ref 0.0–149.0)
VLDL: 9.6 mg/dL (ref 0.0–40.0)

## 2022-06-05 LAB — COMPREHENSIVE METABOLIC PANEL
ALT: 14 U/L (ref 0–35)
AST: 17 U/L (ref 0–37)
Albumin: 4.3 g/dL (ref 3.5–5.2)
Alkaline Phosphatase: 96 U/L (ref 39–117)
BUN: 11 mg/dL (ref 6–23)
CO2: 22 mEq/L (ref 19–32)
Calcium: 9.1 mg/dL (ref 8.4–10.5)
Chloride: 104 mEq/L (ref 96–112)
Creatinine, Ser: 0.87 mg/dL (ref 0.40–1.20)
GFR: 92.08 mL/min (ref >60.00)
Glucose, Bld: 78 mg/dL (ref 70–99)
Potassium: 3.7 mEq/L (ref 3.5–5.1)
Sodium: 136 mEq/L (ref 135–145)
Total Bilirubin: 0.3 mg/dL (ref 0.2–1.2)
Total Protein: 7.6 g/dL (ref 6.0–8.3)

## 2022-06-05 LAB — TSH: TSH: 4.56 u[IU]/mL (ref 0.35–5.50)

## 2022-06-05 LAB — VITAMIN D 25 HYDROXY (VIT D DEFICIENCY, FRACTURES): VITD: 24.36 ng/mL — ABNORMAL LOW (ref 30.00–100.00)

## 2022-06-05 MED ORDER — VITAMIN D (ERGOCALCIFEROL) 1.25 MG (50000 UNIT) PO CAPS
50000.0000 [IU] | ORAL_CAPSULE | ORAL | 0 refills | Status: DC
  Filled 2022-06-05: qty 12, 84d supply, fill #0

## 2022-06-05 MED ORDER — ALBUTEROL SULFATE HFA 108 (90 BASE) MCG/ACT IN AERS
2.0000 | INHALATION_SPRAY | Freq: Four times a day (QID) | RESPIRATORY_TRACT | 4 refills | Status: DC | PRN
  Filled 2022-06-05: qty 6.7, 25d supply, fill #0

## 2022-06-05 NOTE — Progress Notes (Signed)
Subjective:     Brianna Barr is a 26 y.o. female and is here for a comprehensive physical exam. The patient reports doing ok. Taking wellbutrin XL 450 and lexapro 20 mg for anxiety and depression.  States mood, sleep, energy are about the same.  Followed by [facility] for counseling and med management.  Patient states a few months ago she was unable to get blood 2/2 fingerstick hemoglobin being low.  Notes improvement in IBS since taking Linzess.  To have follow-up with GI in the next 2 months.  Requesting refill on albuterol inhaler.  Social History   Socioeconomic History   Marital status: Single    Spouse name: Not on file   Number of children: Not on file   Years of education: Not on file   Highest education level: Bachelor's degree (e.g., BA, AB, BS)  Occupational History   Not on file  Tobacco Use   Smoking status: Never   Smokeless tobacco: Never  Vaping Use   Vaping Use: Never used  Substance and Sexual Activity   Alcohol use: No   Drug use: No   Sexual activity: Not Currently    Partners: Male    Birth control/protection: I.U.D., Abstinence  Other Topics Concern   Not on file  Social History Narrative   Not on file   Social Determinants of Health   Financial Resource Strain: Low Risk  (12/19/2021)   Overall Financial Resource Strain (CARDIA)    Difficulty of Paying Living Expenses: Not very hard  Food Insecurity: No Food Insecurity (12/19/2021)   Hunger Vital Sign    Worried About Running Out of Food in the Last Year: Never true    Ran Out of Food in the Last Year: Never true  Transportation Needs: No Transportation Needs (12/19/2021)   PRAPARE - Transportation    Lack of Transportation (Medical): No    Lack of Transportation (Non-Medical): No  Physical Activity: Insufficiently Active (12/19/2021)   Exercise Vital Sign    Days of Exercise per Week: 2 days    Minutes of Exercise per Session: 20 min  Stress: Stress Concern Present (12/19/2021)   [employer] of  Occupational Health - Occupational Stress Questionnaire    Feeling of Stress : Rather much  Social Connections: Moderately Integrated (12/19/2021)   Social Connection and Isolation Panel [NHANES]    Frequency of Communication with Friends and Family: More than three times a week    Frequency of Social Gatherings with Friends and Family: Twice a week    Attends Religious Services: More than 4 times per year    Active Member of Clubs or Organizations: Yes    Attends Engineer, structural: More than 4 times per year    Marital Status: Never married  Intimate Partner Violence: Not on file   Health Maintenance  Topic Date Due   HPV VACCINES (1 - 2-dose series) Never done   TETANUS/TDAP  Never done   INFLUENZA VACCINE  05/27/2022   COVID-19 Vaccine (5 - Pfizer risk series) 06/21/2022 (Originally 09/24/2021)   PAP-Cervical Cytology Screening  01/14/2025   PAP SMEAR-Modifier  01/14/2025   Hepatitis C Screening  Completed   HIV Screening  Completed    The following portions of the patient's history were reviewed and updated as appropriate: allergies, current medications, past family history, past medical history, past social history, past surgical history, and problem list.  Review of Systems Pertinent items noted in HPI and remainder of comprehensive ROS otherwise negative.  Objective:    BP 122/80 (BP Location: Left Arm, Patient Position: Sitting, Cuff Size: Normal)   Pulse 68   Temp 98.3 F (36.8 C) (Oral)   Ht 5\' 2"  (1.575 m)   Wt 244 lb 6.4 oz (110.9 kg)   SpO2 97%   BMI 44.70 kg/m  General appearance: alert, cooperative, and no distress, flat affect Head: Normocephalic, without obvious abnormality, atraumatic Eyes: conjunctivae/corneas clear. PERRL, EOM's intact. Fundi benign. Ears: normal TM's and external ear canals both ears Nose: Nares normal. Septum midline. Mucosa normal. No drainage or sinus tenderness. Throat: lips, mucosa, and tongue normal; teeth and gums  normal Neck: no adenopathy, no carotid bruit, no JVD, supple, symmetrical, trachea midline, and thyroid not enlarged, symmetric, no tenderness/mass/nodules Lungs: clear to auscultation bilaterally Heart: regular rate and rhythm, S1, S2 normal, no murmur, click, rub or gallop Abdomen: soft, non-tender; bowel sounds normal; no masses,  no organomegaly Extremities: extremities normal, atraumatic, no cyanosis or edema Pulses: 2+ and symmetric Skin: Skin color, texture, turgor normal. No rashes or lesions Lymph nodes: Cervical, supraclavicular, and axillary nodes normal. Neurologic: Alert and oriented X 3, normal strength and tone. Normal symmetric reflexes. Normal coordination and gait       06/05/2022   10:03 AM 12/19/2021   11:35 AM 11/13/2021    9:14 AM  Depression screen PHQ 2/9  Decreased Interest 1 1 2   Down, Depressed, Hopeless 1 2 2   PHQ - 2 Score 2 3 4   Altered sleeping 2 2 3   Tired, decreased energy 3 3 2   Change in appetite 1 1 2   Feeling bad or failure about yourself  1 1 1   Trouble concentrating 1 1 3   Moving slowly or fidgety/restless 1 0 0  Suicidal thoughts 0 0 0  PHQ-9 Score 11 11 15   Difficult doing work/chores Very difficult        06/05/2022   10:38 AM 06/25/2020    3:55 PM 11/03/2019    4:50 PM 09/29/2019    9:38 AM  GAD 7 : Generalized Anxiety Score  Nervous, Anxious, on Edge 3 3 2 3   Control/stop worrying 3 3 1 3   Worry too much - different things 3 3 2 3   Trouble relaxing 2 3 1 3   Restless 1 2 0 2  Easily annoyed or irritable 1 2 0 2  Afraid - awful might happen 3 2 1 1   Total GAD 7 Score 16 18 7 17   Anxiety Difficulty Extremely difficult        Assessment:    Healthy female exam.      Plan:    Anticipatory guidance given including wearing seatbelts, smoke detectors in the home, increasing physical activity, increasing p.o. intake of water and vegetables. -labs -Pap done 01/14/2022 with OB/GYN. -Immunizations reviewed -Given handout -Next CPE in 1  year See After Visit Summary for Counseling Recommendations   Mild intermittent asthma without complication  - Plan: albuterol (VENTOLIN HFA) 108 (90 Base) MCG/ACT inhaler  Class 3 severe obesity due to excess calories with serious comorbidity and body mass index (BMI) of 40.0 to 44.9 in adult (HCC) -Body mass index is 44.7 kg/m. -Lifestyle modifications encouraged -Medications and ongoing depression symptoms likely contributing  - Plan: TSH, Vitamin D, 25-hydroxy, Lipid panel, CMP, T4, Free  Vitamin D deficiency  - Plan: Vitamin D, 25-hydroxy  History of anemia  - Plan: CBC with Differential/Platelet  GAD -GAD-7 score 16 -Continue counseling and follow-up with [facility] -Continue current medications including Wellbutrin XL  450 mg and Lexapro 20 mg daily -discussed importance of self-care -Plan: TSH, T4, Free  Major depressive disorder, recurrent episode, moderate (HCC) -PHQ-9 score 11 -Continue follow-up with [facility] for medication management and counseling -Continue Wellbutrin XL 450 mg and Lexapro 20 mg daily -Plan: TSH, T4, Free  F/u prn  Grier Mitts, MD

## 2022-06-18 ENCOUNTER — Ambulatory Visit (INDEPENDENT_AMBULATORY_CARE_PROVIDER_SITE_OTHER): Payer: 59 | Admitting: Psychiatry

## 2022-06-18 DIAGNOSIS — F411 Generalized anxiety disorder: Secondary | ICD-10-CM

## 2022-06-18 NOTE — Progress Notes (Signed)
[facility] Counselor/Therapist Progress Note  Patient ID: Brianna Barr, MRN: [id number],    Date: 06/18/2022  Time Spent:  58 minutes  Treatment Type: Individual Therapy  Reported Symptoms: anxiety, depression "some better", some "overall anxiety but also some recently has been situational"   Mental Status Exam:  Appearance:   Neat     Behavior:  Appropriate, Sharing, and Motivated  Motor:  Normal  Speech/Language:   Clear and Coherent  Affect:  anxious  Mood:  anxious and some depression but improved  Thought process:  goal directed  Thought content:    Obsessive thoughts  Sensory/Perceptual disturbances:    WNL  Orientation:  oriented to person, place, time/date, situation, day of week, month of year, year, and stated date of June 18, 2022  Attention:  Fair/Good  Concentration:  Good and Fair  Memory:  WNL  Fund of knowledge:   Good  Insight:    Good and Fair  Judgment:   Good  Impulse Control:  Good and Fair   Risk Assessment: Danger to Self:  No Self-injurious Behavior: No Danger to Others: No Duty to Warn:no Physical Aggression / Violence:No  Access to Firearms a concern: No  Gang Involvement:No   Subjective:  Patient in today and continues and her taking more risks and being more open regarding some aspects of her past that have been difficult to talk about previously. Anxious and some depression today but depression has definitely decreased which she feels good about. "Anxious, overthinking thoughts" continue but not quite as overwhelming as they were and she had been talking back to them which is helping. Continued work on past issues of not feeling good about herself, feeling "less than". Focused more today on her "long history of social anxiety" that she has dealt with since approx kindergarten and "was probably more aware myself in the 3rd grade" and feelings of having to start off new again as my parents moved a lot when I was growing up. Processed  some very specific events, thoughts, and feelings of isolation and felt "very different" from the other kids in class. Sensitive as to how she felt she looked and dressed differently and "my hair was really different, not straightened" as I was very sensitive and "shy". Recalls very vivid memories that were "sad, feeling unworthy of other people's time, misfit, feeling lost, not wanted, abandoned, discardable, and extremely vulnerable." Has made it difficult for years for "me to make friends, to read social cues and may have misread sometimes, not sure." "I so craved attention and interaction." "Attached myself as the teacher's pet as that was less risky of being abandoned." Some slight improvement but still isolation in 6th/7th grade as I got involved in orchestra and a couple other clubs but still struggled some.  Trust issues, she clearly remembers. Recalls being "tested" to see what might be wrong with me." "First friend was at end of 3rd grade with another child who was new to the school too.""This has followed me all my life". Is committed to exposing this more in order to work on it in therapy in order to eventually move forward feeling good about herself.  Interventions: Cognitive Behavioral Therapy and Ego-Supportive  Treatment Goals: Goals will remain on tx plan as patient works with strategies to meet her goals.  Progress will be noted each session and documented in "Progress" section of Plan. Long term goal: Reduce overall level, frequency, and intensity of the anxiety so that daily functioning is  not impaired. Short term goal: Increase understanding of beliefs and messages that produce anxiety, depression, or "worries". Strategy: Identify, challenge, and replace anxious/depressive/fearful self-talk with positive, realistic, and empowering self-talk  Diagnosis:   ICD-10-CM   1. Generalized anxiety disorder  F41.1      Plan:  Patient today showing good motivation and active participation  in session as she worked on significant history of low self esteem, not fitting in "anywhere", and how this has stuck with her through the years. However is working on it now and noticing some very gradual progress. Did well today in confronting uncomfortable feelings and memories in order to push forward. Will continue next session. Encouraged patient to continue in practicing more positive behaviors including: Believing in herself as she works to make significant changes, refraining from taking on extra work shifts as this adds to her stress/anxiety, reflect on progress that she has already made, interrupt negative thought patterns as they occur and be able to challenge and replace with more reality based thought patterns that do not feed her anxiety and depression, being more open to meeting new friends when she has the opportunity, consistent positive self talk, staying in touch with her small core group of friends, identifying herself in more positive ways, stay in the present versus the past or too far into the future, saying no when she needs to say no without guilt, allow for good sleep patterns, remaining contact with supportive people, take breaks as she can throughout the day or evening, and realize the strength she shows working with goal directed behaviors to move in a direction that supports her improved emotional health.  Goal review and progress/challenges noted with patient.  Next appointment within 2 to 3 weeks.  This record has been created using [employer].  Chart creation errors have been sought, but may not always have been located and corrected.  Such creation errors do not reflect on the standard of medical care provided.   Mathis Fare, LCSW

## 2022-07-02 ENCOUNTER — Ambulatory Visit: Payer: 59 | Admitting: Psychiatry

## 2022-07-02 DIAGNOSIS — F411 Generalized anxiety disorder: Secondary | ICD-10-CM | POA: Diagnosis not present

## 2022-07-02 NOTE — Progress Notes (Signed)
[facility] Counselor/Therapist Progress Note  Patient ID: Brianna Barr, MRN: [id number],    Date: 07/02/2022  Time Spent: 55 minutes   Treatment Type: Individual Therapy  Reported Symptoms: anxiety, depression  Mental Status Exam:  Appearance:   Casual     Behavior:  Appropriate, Sharing, and Motivated  Motor:  Normal  Speech/Language:   Clear and Coherent  Affect:  Depressed and anxiety  Mood:  anxious and depressed  Thought process:  goal directed  Thought content:    WNL  Sensory/Perceptual disturbances:    WNL  Orientation:  oriented to person, place, time/date, situation, day of week, month of year, year, and stated date of Sept. 6, 2023  Attention:  Good  Concentration:  Good and Fair  Memory:  WNL  Fund of knowledge:   Good  Insight:    Good  Judgment:   Good  Impulse Control:  Good   Risk Assessment: Danger to Self:  No Self-injurious Behavior: No Danger to Others: No Duty to Warn:no Physical Aggression / Violence:No  Access to Firearms a concern: No  Gang Involvement:No   Subjective:  Patient in today reporting anxiety and depression with anxiety being stronger symptom. Continued work today from prior session regarding her difficulty meeting and making friends, her social anxiety, and taking risks to have friendships. "Risks" to patient include "potential rejection", and would "feel too open and vulnerable." Feels "people's assumptions could be wrong and hurtful." Tendency to" jump ahead and assume the worst versus trusting things to possible turn out for the good." Processed this at length in session today which seemed helpful to patient, and she has very gradually begun to reach out a little more. Tearfully processed more of her difficult history in earlier years leading up to now, and looking to where she wants to go moving forward. Anxiety and depression  a little better. To continue goal directed behaviors, working on feeling more self-acceptance,  less depressed, and what she can do personally to help in both of these.  Depression continues to decrease some very gradually.  Anxiety has decreased minimally at this point but she is also working on some very sensitive issues from the past.  Showing some good coverage as she talks about things from her past that create more anxiety but in talking, she does end up eventually feeling calmer.  Interventions: Cognitive Behavioral Therapy and Ego-Supportive  Treatment Goals: Goals will remain on tx plan as patient works with strategies to meet her goals.  Progress will be noted each session and documented in "Progress" section of Plan. Long term goal: Reduce overall level, frequency, and intensity of the anxiety so that daily functioning is not impaired. Short term goal: Increase understanding of beliefs and messages that produce anxiety, depression, or "worries". Strategy: Identify, challenge, and replace anxious/depressive/fearful self-talk with positive, realistic, and empowering self-talk  Diagnosis:   ICD-10-CM   1. Generalized anxiety disorder  F41.1       Plan:  Patient today showing good participation and motivation as she worked further on her difficult history of feeling "that she does not fit in anywhere", ongoing low self-esteem t "since childhood" hat she feels has led to her issues with anxiety and depression.  Is showing good progress and at least confronting some of these issues that are very difficult for her.  Is taking more steps towards active participation in things including a church as she would like to develop some healthy friendships, but is held back by  a lot of her anxiety and fears which she is confronting more directly at this point.  Did well today and pushing forward even though this work is difficult for her and I sure her were going at her speed of comfort which seems to be working out for patient.  Is making progress and needs continued therapy to further work on  her goal directed behaviors especially in the areas of social connections and making friendships, self-esteem issues, and being able to move forward from a difficult past. Encouraged patient in her practice of positive behaviors including: Refraining from taking on extra work shifts as this adds to her stress and anxiety, believing more in herself as she works to make significant changes in her life going forward, reflect on progress that she has already made, interrupt negative thought patterns as they occur and be able to challenge and replace them with more reality based thought patterns that do not feed her anxiety and depression, being more open to meeting new friends when she has the opportunity, consistent positive self talk, remaining in touch with her small core group of friends, identifying herself in more positive ways, remaining in the present versus the past or too far into the future, allow for good sleep patterns, saying no when she needs to say no without guilt, take breaks as she can throughout the day or evening, and recognize the strength she shows working with goal directed behaviors to move in a direction that supports her improved emotional health and overall outlook.  Goal review and progress/challenges noted with patient.  Next appointment within 2 to 3 weeks.  This record has been created using [employer].  Chart creation errors have been sought, but may not always have been located and corrected.  Such creation errors do not reflect on the standard of medical care provided.   Mathis Fare, LCSW

## 2022-07-10 IMAGING — DX DG CHEST 2V
2 series · 2 of 2 positions shown · non-contrast
Comparison: Chest radiograph 03/24/2018

CLINICAL DATA: Right cervical and axillary lymphadenopathy.

EXAM:
CHEST - 2 VIEW

[chest pa]
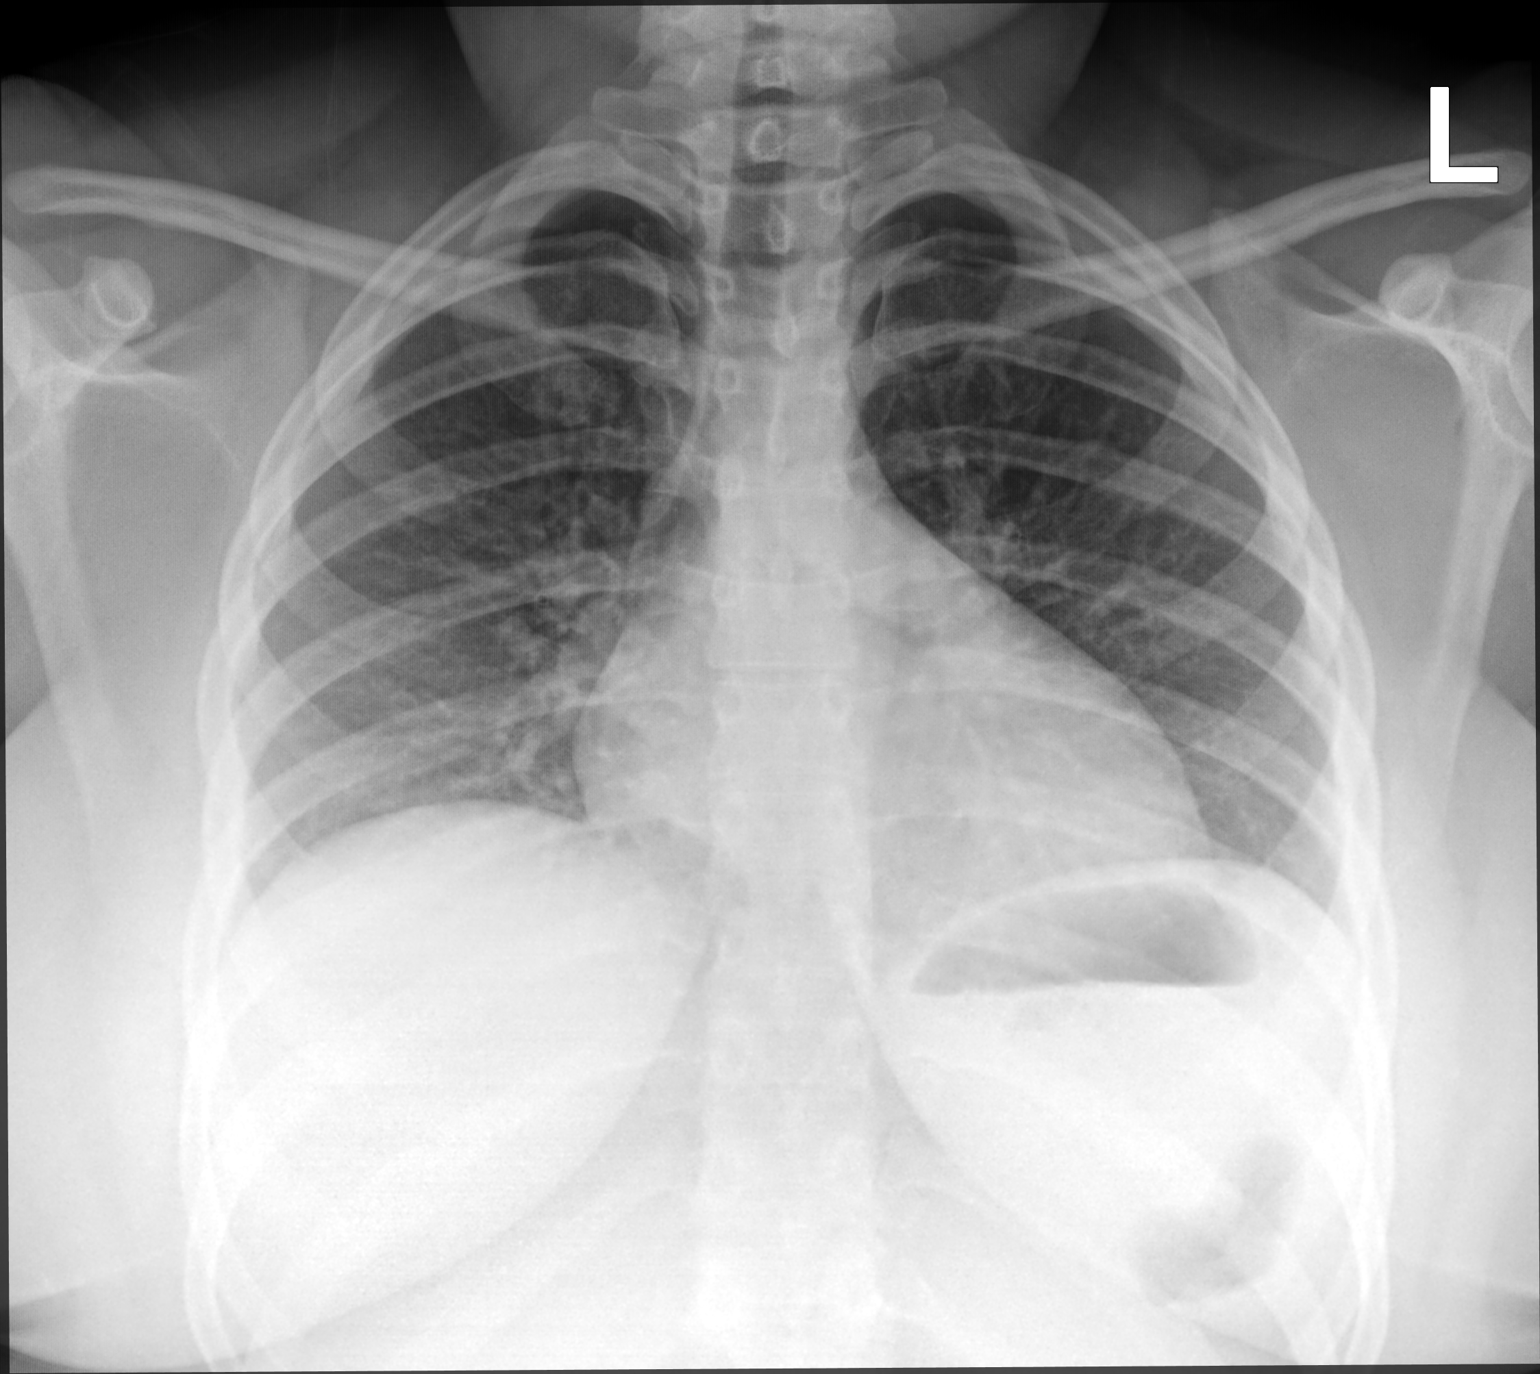

[chest lat]
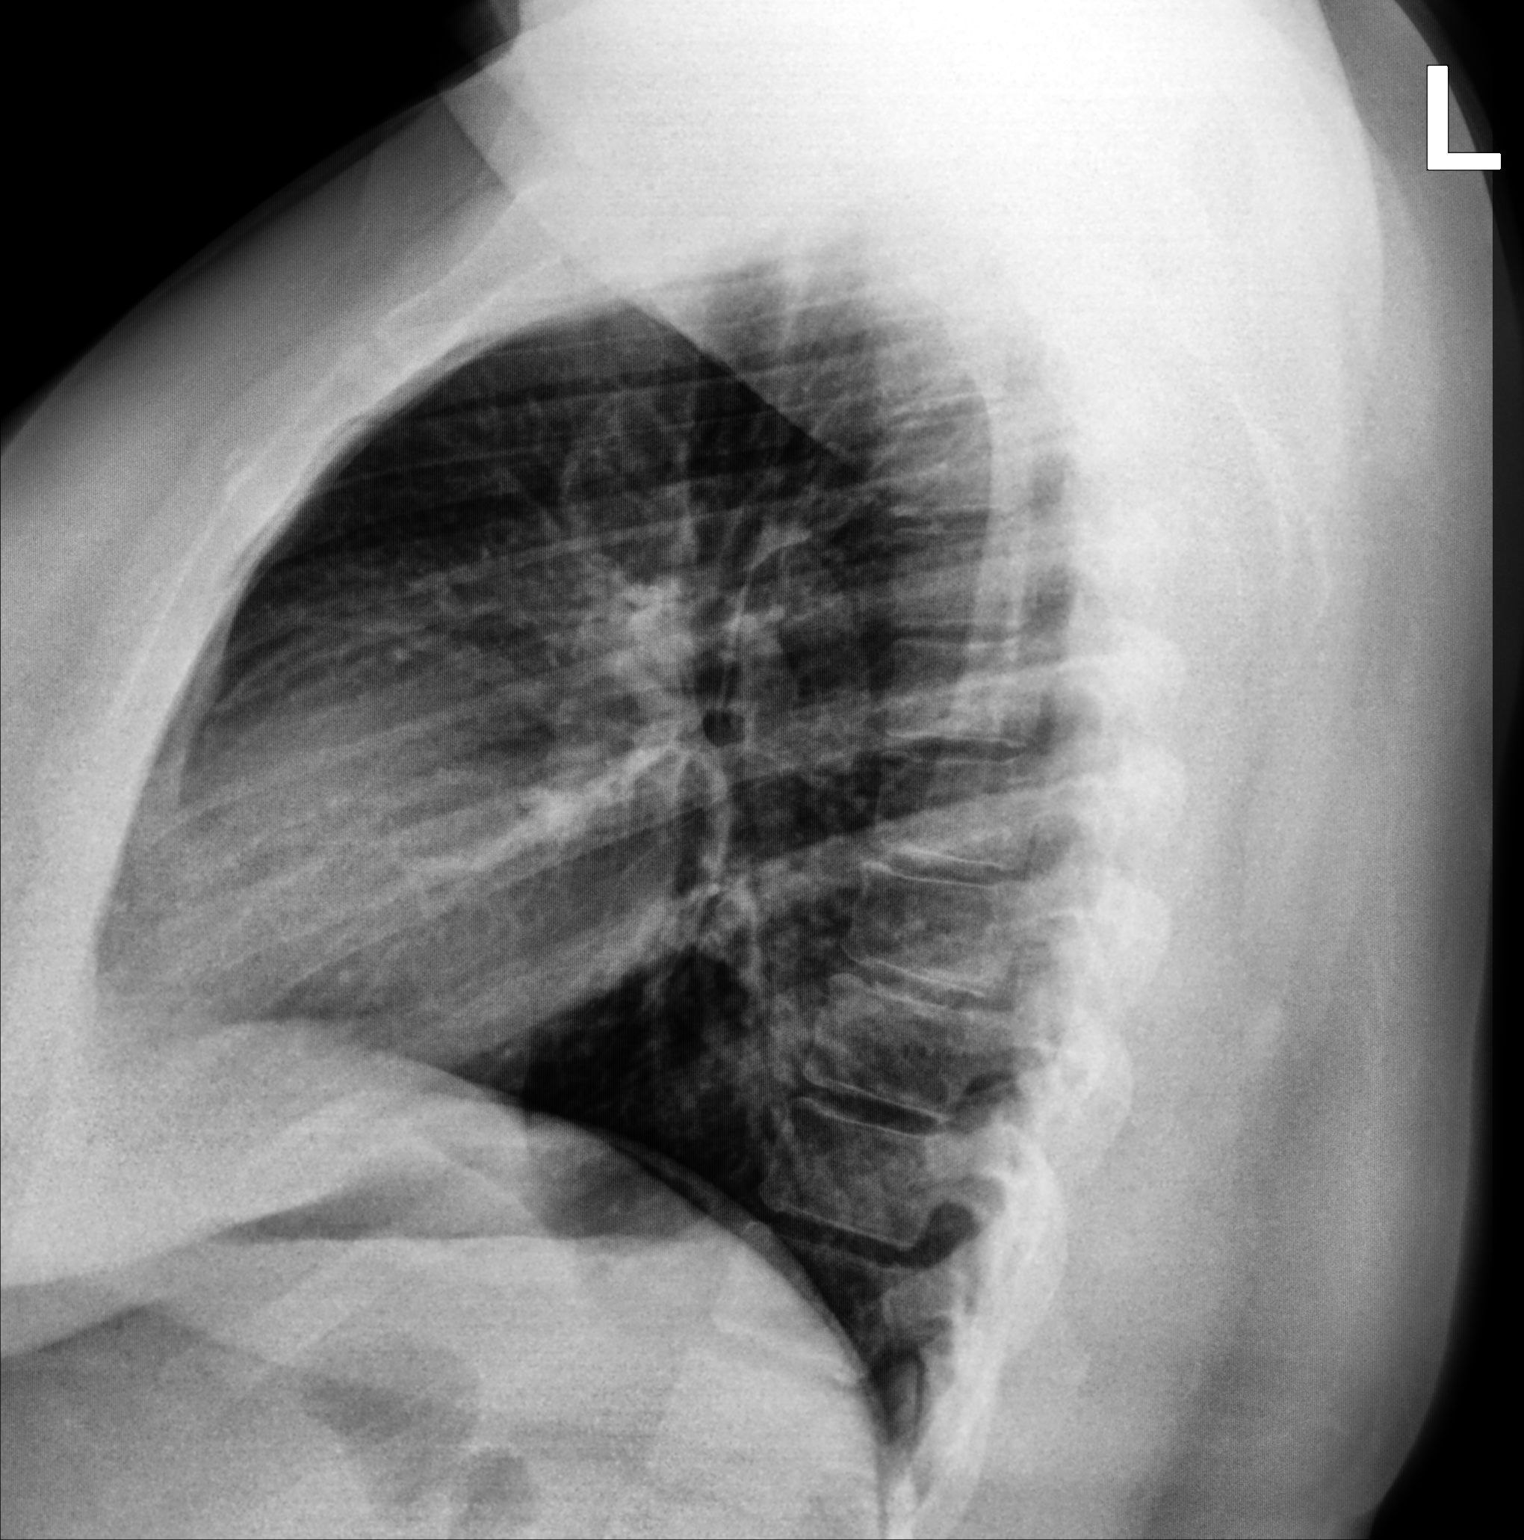

[2 of 2 positions shown; findings below may reference images not displayed]

FINDINGS: Lung volumes are low. The heart is normal in size for technique.
There is no radiographic evidence of adenopathy. No pulmonary edema.
No focal airspace disease. No pleural fluid or pneumothorax. No
acute osseous abnormalities are seen.
IMPRESSION: Low lung volumes without acute chest findings. No radiographic
findings of intrathoracic adenopathy.

## 2022-07-17 ENCOUNTER — Ambulatory Visit (INDEPENDENT_AMBULATORY_CARE_PROVIDER_SITE_OTHER): Payer: 59 | Admitting: Psychiatry

## 2022-07-17 DIAGNOSIS — F411 Generalized anxiety disorder: Secondary | ICD-10-CM | POA: Diagnosis not present

## 2022-07-17 NOTE — Progress Notes (Signed)
[facility] Counselor/Therapist Progress Note  Patient ID: Brianna Barr, MRN: [id number],    Date: 07/17/2022  Time Spent: 55 minutes   Treatment Type: Individual Therapy  Reported Symptoms: anxiety, "some depression"  Mental Status Exam:  Appearance:   Casual     Behavior:  Appropriate, Sharing, and Motivated  Motor:  Normal  Speech/Language:   Clear and Coherent  Affect:  Depressed and anxious  Mood:  anxious and depressed  Thought process:  goal directed  Thought content:    Obsessive thoughts  Sensory/Perceptual disturbances:    WNL  Orientation:  oriented to person, place, time/date, situation, day of week, month of year, year, and stated date of Sept. 21, 2023  Attention:  Good  Concentration:  Good  Memory:  WNL  Fund of knowledge:   Good  Insight:    Good and Fair  Judgment:   Good  Impulse Control:  Good   Risk Assessment: Danger to Self:  No Self-injurious Behavior: No Danger to Others: No Duty to Warn:no Physical Aggression / Violence:No  Access to Firearms a concern: No  Gang Involvement:No   Subjective:  Patient in session today reporting anxiety and some depression "but anxiety is the stronger symptom".  Difficulty in processing hurts and negative events past and present and tending to see them as connected and ongoing. Discussed more openly today about disconnecting the present from the past in terms of "letting go and moving forward." Worked with specific examples today in separating negatives from things happening currently today. Focusing on believing in herself more, and how she can make changes, work to let go of past and begin to feel it's possible to make constructive changed towards her goals. Reviewed her difficulty as well as helpful suggestions with trying to make changes and believing more in herself.  Anxiety a little better today.  Working to allow more self-acceptance.  Some decrease in depression gradually.   Interventions:  Cognitive Behavioral Therapy and Ego-Supportive  Treatment Goals: Goals will remain on tx plan as patient works with strategies to meet her goals.  Progress will be noted each session and documented in "Progress" section of Plan. Long term goal: Reduce overall level, frequency, and intensity of the anxiety so that daily functioning is not impaired. Short term goal: Increase understanding of beliefs and messages that produce anxiety, depression, or "worries". Strategy: Identify, challenge, and replace anxious/depressive/fearful self-talk with positive, realistic, and empowering self-talk  Diagnosis:   ICD-10-CM   1. Generalized anxiety disorder  F41.1      Plan: Patient today showing good motivation and participation in session focusing more on her difficult past especially the feeling of "not fitting in anywhere", and ongoing lower self-esteem since her earlier childhood days that she feels has led to her issues currently with depression and anxiety, but more so anxiety.  Was more open today and seeing some specific connections in her past and what she has been struggling with in her present as far as "old hurts and current hurts".  Did quite well in processing thoughts, feelings, and struggles today as this is very sensitive for her but probably for the first time in her life is feeling and showing a little more confidence in addressing these issues that have had a lot of control for her.  Definitely making progress and needs continued therapy as she works further with goal directed behaviors especially in working to let go of old hurts and live more in the present with new  opportunities, and making social connections and friendships while improving her self-esteem. Encouraged patient in practicing more positive behaviors as noted in session including: Refraining from taking on extra work shifts as this adds to her stress and anxiety, believing in herself as she works to make significant changes in  her life going forward, reflect on progress that she has already made, interrupt negative thought patterns as they occur and be able to challenge and replace them with more reality based thought patterns that do not feed her anxiety and depression, being more open to meeting new friends as she has the opportunity, consistent positive self talk, remaining in touch with small core group of friends, identifying herself in more positive ways, remaining in the present versus the past or too far into the future, saying no when she needs to say no without guilt, allow for good sleep patterns, take breaks as she can throughout the day or evening, and recognize the strength she shows working with goal directed behaviors to move in a direction that supports her improved emotional health.  Goal review and progress/challenges noted with patient.  Next appointment within 2 to 3 weeks.  This record has been created using [employer].  Chart creation errors have been sought, but may not always have been located and corrected.  Such creation errors do not reflect on the standard of medical care provided.   Mathis Fare, LCSW

## 2022-07-21 ENCOUNTER — Other Ambulatory Visit (HOSPITAL_COMMUNITY)
Admission: RE | Admit: 2022-07-21 | Discharge: 2022-07-21 | Disposition: A | Discharge status: Home / Self Care | Payer: 59 | Source: Ambulatory Visit | Attending: Family Medicine | Admitting: Family Medicine

## 2022-07-21 ENCOUNTER — Other Ambulatory Visit (HOSPITAL_BASED_OUTPATIENT_CLINIC_OR_DEPARTMENT_OTHER): Payer: Self-pay

## 2022-07-21 ENCOUNTER — Encounter: Payer: Self-pay | Admitting: Family Medicine

## 2022-07-21 ENCOUNTER — Ambulatory Visit: Payer: 59 | Admitting: Family Medicine

## 2022-07-21 VITALS — BP 94/68 | HR 90 | Temp 99.3°F | Wt 239.0 lb

## 2022-07-21 DIAGNOSIS — M546 Pain in thoracic spine: Secondary | ICD-10-CM | POA: Diagnosis not present

## 2022-07-21 DIAGNOSIS — R202 Paresthesia of skin: Secondary | ICD-10-CM | POA: Diagnosis not present

## 2022-07-21 DIAGNOSIS — N761 Subacute and chronic vaginitis: Secondary | ICD-10-CM | POA: Insufficient documentation

## 2022-07-21 DIAGNOSIS — M545 Low back pain, unspecified: Secondary | ICD-10-CM | POA: Diagnosis not present

## 2022-07-21 DIAGNOSIS — G8929 Other chronic pain: Secondary | ICD-10-CM

## 2022-07-21 LAB — VITAMIN B12: Vitamin B-12: 591 pg/mL (ref 211–911)

## 2022-07-21 LAB — TSH: TSH: 4.05 u[IU]/mL (ref 0.35–5.50)

## 2022-07-21 LAB — T4, FREE: Free T4: 0.88 ng/dL (ref 0.60–1.60)

## 2022-07-21 MED ORDER — FLUCONAZOLE 150 MG PO TABS
150.0000 mg | ORAL_TABLET | Freq: Once | ORAL | 0 refills | Status: AC
  Filled 2022-07-21: qty 1, 1d supply, fill #0

## 2022-07-21 NOTE — Progress Notes (Signed)
Subjective:    Patient ID: Brianna Barr, female    DOB: [birth year], 26 y.o.   MRN: [id number]  Chief Complaint  Patient presents with   Back Pain    Gradually getting worse and harder to maintain.    Vaginitis    Had infection when had pap, but was not bothering her. Was advised to call pcp or gyn office if it started bothering her. Has been having discharge. PAP was 2 months ago, a month ago started otc treatments but has not helped.     HPI Patient was seen today for ongoing concerns.  Pt endorses vaginal d/c since pap 2 months ago.  States was told she had an infection at time of pap, but it was not bothering her. Tried OTC products without improvement in symptoms.  Endorses clear to whitish yellow d/c, vaginal pruritis.  Pt denies recent intercourse, changes in soaps, lotions, or detergents.  Pt notes lower back pain becoming worse and harder to manage.  Pain in midline low back and across both sided above hips. Pt endorses h/o chronic back pain starting in HS perhaps 2/2 carrying a heavy book bag and posture.  Taking tylenol, ibuprofen, stretching.  Has not had imaging of back.  Was in PT which was helpful.  Pt notes intermittent pins and needles sensation in lower legs and hands.  Has a crawling sensation of anterior thighs bilaterally and bilateral forearms x 2 months.  Sensation may last 5-10 mins.    Past Medical History:  Diagnosis Date   Anxiety    Asthma    Depression    Overactive bladder    Stress incontinence     No Known Allergies  ROS General: Denies fever, chills, night sweats, changes in weight, changes in appetite HEENT: Denies headaches, ear pain, changes in vision, rhinorrhea, sore throat CV: Denies CP, palpitations, SOB, orthopnea Pulm: Denies SOB, cough, wheezing GI: Denies abdominal pain, nausea, vomiting, diarrhea, constipation GU: Denies dysuria, hematuria, frequency +vaginal discharge, vaginal irritation Msk: Denies muscle cramps, joint pains +  chronic back pain Neuro: Denies weakness  +paraesthesias Skin: Denies rashes, bruising Psych: Denies depression, anxiety, hallucinations     Objective:    Blood pressure 94/68, pulse 90, temperature 99.3 F (37.4 C), temperature source Oral, weight 239 lb (108.4 kg), SpO2 99 %.  Gen. Pleasant, well-nourished, in no distress, normal affect   HEENT: [place]/AT, face symmetric, conjunctiva clear, no scleral icterus, PERRLA, EOMI, nares patent without drainage Lungs: no accessory muscle use, CTAB, no wheezes or rales Cardiovascular: RRR, no m/r/g, no peripheral edema GU: Aptima self swab collected Musculoskeletal: TTP of midline thoracic, lumbar spine, paraspinal muscles.  No deformities, no cyanosis or clubbing, normal tone Neuro:  A&Ox3, CN II-XII intact, normal gait. Normal sensation in extremities. Skin:  Warm, no lesions/ rash  Wt Readings from Last 3 Encounters:  07/21/22 239 lb (108.4 kg)  06/05/22 244 lb 6.4 oz (110.9 kg)  01/14/22 232 lb (105.2 kg)    Lab Results  Component Value Date   WBC 8.7 06/05/2022   HGB 13.8 06/05/2022   HCT 41.9 06/05/2022   PLT 322.0 06/05/2022   GLUCOSE 78 06/05/2022   CHOL 190 06/05/2022   TRIG 48.0 06/05/2022   HDL 47.70 06/05/2022   LDLCALC 132 (H) 06/05/2022   ALT 14 06/05/2022   AST 17 06/05/2022   NA 136 06/05/2022   K 3.7 06/05/2022   CL 104 06/05/2022   CREATININE 0.87 06/05/2022   BUN 11 06/05/2022  CO2 22 06/05/2022   TSH 4.56 06/05/2022   HGBA1C 5.5 11/13/2021    Assessment/Plan:  Chronic midline thoracic back pain -Continue supportive care including heat, stretching, massage, topical analgesics, NSAIDs, Tylenol as needed -Weight loss encouraged  - Plan: Ambulatory referral to Orthopedic Surgery  Chronic midline low back pain without sciatica  - Plan: Ambulatory referral to Orthopedic Surgery  Paresthesia  -discussed possible causes including vitamin def., nerve compression -Continue supportive care - Plan: Vitamin  B12, TSH, T4, Free  Subacute vaginitis -last pap was 01/14/22 per chart review with no mention of concurrent infection. -aptima self swab collected. -start diflucan while awaiting results.  - Plan: Cervicovaginal ancillary only, fluconazole (DIFLUCAN) 150 MG tablet  F/u prn  Grier Mitts, MD

## 2022-07-22 LAB — CERVICOVAGINAL ANCILLARY ONLY
Bacterial Vaginitis (gardnerella): NEGATIVE
Candida Glabrata: NEGATIVE
Candida Vaginitis: NEGATIVE
Chlamydia: NEGATIVE
Comment: NEGATIVE
Comment: NEGATIVE
Comment: NEGATIVE
Comment: NEGATIVE
Comment: NEGATIVE
Comment: NORMAL
Neisseria Gonorrhea: NEGATIVE
Trichomonas: NEGATIVE

## 2022-07-30 ENCOUNTER — Ambulatory Visit (INDEPENDENT_AMBULATORY_CARE_PROVIDER_SITE_OTHER): Payer: 59 | Admitting: Psychiatry

## 2022-07-30 DIAGNOSIS — F411 Generalized anxiety disorder: Secondary | ICD-10-CM | POA: Diagnosis not present

## 2022-07-30 NOTE — Progress Notes (Signed)
[facility] Counselor/Therapist Progress Note  Patient ID: Brianna Barr, MRN: [id number],    Date: 07/30/2022  Time Spent: 58 minutes   Treatment Type: Individual Therapy  Reported Symptoms: anxiety, depression "90% anxiety and 10% depression"  Mental Status Exam:  Appearance:   Casual     Behavior:  Appropriate, Sharing, and Motivated  Motor:  Normal  Speech/Language:   Clear and Coherent  Affect:  anxious  Mood:  anxious  Thought process:  goal directed  Thought content:    Obsessive thoughts (past and present)  Sensory/Perceptual disturbances:    WNL  Orientation:  oriented to person, place, time/date, situation, day of week, month of year, year, and stated date of Oct. 4, 2023  Attention:  Good  Concentration:  Good  Memory:  WNL  Fund of knowledge:   Good  Insight:    Good and Fair  Judgment:   Good  Impulse Control:  Good   Risk Assessment: Danger to Self:  No Self-injurious Behavior: No Danger to Others: No Duty to Warn:no Physical Aggression / Violence:No  Access to Firearms a concern: No  Gang Involvement:No   Subjective:  Patient in session today reporting anxiety, some depression, and some obsessive thoughts. Continues working on issues from back in her past growing up an having significant anxiety and depression related to hurts from past.  Feeling not worthy of being celebrated (re: birthdays and other events). Feels she is and needs to be the "driving force " behind events to honor/celebrated others, but never "have it for myself". Sadness expressed related to this today. Looked at how similar situations affect her now, and how in her core group of friends there have been some difficult discussions re: their differences in their lives "like 2 are married, 1 in a relationship, and then just me who isn't married nor in a relationship." "We actually had a come to [place] meeting over it, which didn't hurt our relationship but made me more aware of being  the only one that was single and different". See this as related to her difficulties socially and emotionally as a younger child growing up. Worked more intently today on these issues which seemed to be helpful to patient. She stated that she's realizing "I may not be way out there and abnormal, but I also don't think that nothing is wrong with me." Tearfully processed this more before session end today and plans to follow up in working on this more between sessions.  Did well in processing negative events from her past as well as the resulting hurt feelings.  Trying not to let the past define her in the present as it has for quite a while.  Trying to gain more self-acceptance.  Interventions: Cognitive Behavioral Therapy and Ego-Supportive  Treatment Goals: Goals will remain on tx plan as patient works with strategies to meet her goals.  Progress will be noted each session and documented in "Progress" section of Plan. Long term goal: Reduce overall level, frequency, and intensity of the anxiety so that daily functioning is not impaired. Short term goal: Increase understanding of beliefs and messages that produce anxiety, depression, or "worries". Strategy: Identify, challenge, and replace anxious/depressive/fearful self-talk with positive, realistic, and empowering self-talk   Diagnosis:   ICD-10-CM   1. Generalized anxiety disorder  F41.1       Plan:   Patient today showing good participation although struggles some with motivation.  Continues more today on her difficult history especially the feeling  of "not fitting in anywhere", lower self-esteem since childhood days, all of which she feels has contributed to her current depression and anxiety, especially her anxiety.  Still guarded at times and understandable especially since she is talking more openly about a very difficult past that has affected/limited her over the years.  Able to see some connection she had not made previously between her  past and her present and also trying to accept the fact she can make changes and eventually feel differently even though she cannot change the past she can affect her future in a more positive way. A little more self-confidence.  Making progress gradually and needs continued therapy to continue working through old hurts in order to experience some healing and be able to live healthier and happier in the present, to include positive connections and friendships and a healthier sense of self-esteem. Encouraged patient in her practice of more positive behaviors as discussed in session including: Believing more in herself as she continues to work to make significant changes in her life going forward, continue refraining from taking on extra work shifts as this adds to her stress and anxiety, reflect on progress that she has already made, interrupt negative thought patterns as they occur and be able to challenge and replace them with more reality based thought patterns that do not feed her anxiety and depression, being more open to meeting new friends as she has the opportunity, consistent positive self talk, remaining in touch with small core group of friends, identifying herself in more positive ways, stay in the present versus the past or too far into the future, saying no when she needs to say no without guilt, allow for good sleep patterns, taking breaks as she can throughout the day or evening, and realize the strength she shows when working with goal directed behaviors to move in a direction that supports her improved emotional health and overall outlook.  Goal review and progress/challenges noted with patient.  Next appointment within 2 to 3 weeks.  This record has been created using [employer].  Chart creation errors have been sought, but may not always have been located and corrected.  Such creation errors do not reflect on the standard of medical care provided.   Shanon Ace,  LCSW

## 2022-07-31 ENCOUNTER — Other Ambulatory Visit (HOSPITAL_BASED_OUTPATIENT_CLINIC_OR_DEPARTMENT_OTHER): Payer: Self-pay

## 2022-07-31 MED ORDER — PREDNISONE 10 MG PO TABS
ORAL_TABLET | ORAL | 0 refills | Status: AC
  Filled 2022-07-31: qty 20, 8d supply, fill #0

## 2022-08-01 ENCOUNTER — Other Ambulatory Visit (HOSPITAL_BASED_OUTPATIENT_CLINIC_OR_DEPARTMENT_OTHER): Payer: Self-pay

## 2022-08-01 ENCOUNTER — Telehealth: Payer: 59 | Admitting: Physician Assistant

## 2022-08-01 DIAGNOSIS — T7840XA Allergy, unspecified, initial encounter: Secondary | ICD-10-CM

## 2022-08-01 MED ORDER — FAMOTIDINE 20 MG PO TABS
20.0000 mg | ORAL_TABLET | Freq: Two times a day (BID) | ORAL | 0 refills | Status: DC
  Filled 2022-08-01: qty 30, 15d supply, fill #0

## 2022-08-01 MED ORDER — CEPHALEXIN 500 MG PO CAPS
500.0000 mg | ORAL_CAPSULE | Freq: Four times a day (QID) | ORAL | 0 refills | Status: DC
  Filled 2022-08-01: qty 20, 5d supply, fill #0

## 2022-08-01 NOTE — Progress Notes (Signed)
Virtual Visit Consent   Brianna Barr, you are scheduled for a virtual visit with a [facility] provider today. Just as with appointments in the office, your consent must be obtained to participate. Your consent will be active for this visit and any virtual visit you may have with one of our providers in the next 365 days. If you have a MyChart account, a copy of this consent can be sent to you electronically.  As this is a virtual visit, video technology does not allow for your provider to perform a traditional examination. This may limit your provider's ability to fully assess your condition. If your provider identifies any concerns that need to be evaluated in person or the need to arrange testing (such as labs, EKG, etc.), we will make arrangements to do so. Although advances in technology are sophisticated, we cannot ensure that it will always work on either your end or our end. If the connection with a video visit is poor, the visit may have to be switched to a telephone visit. With either a video or telephone visit, we are not always able to ensure that we have a secure connection.  By engaging in this virtual visit, you consent to the provision of healthcare and authorize for your insurance to be billed (if applicable) for the services provided during this visit. Depending on your insurance coverage, you may receive a charge related to this service.  I need to obtain your verbal consent now. Are you willing to proceed with your visit today? Brianna Barr has provided verbal consent on 08/01/2022 for a virtual visit (video or telephone). Mar Daring, PA-C  Date: 08/01/2022 2:25 PM  Virtual Visit via Video Note   I, Mar Daring, connected with  Brianna Barr  ([id number], November 02, 1995) on 08/01/22 at  2:15 PM EDT by a video-enabled telemedicine application and verified that I am speaking with the correct person using two identifiers.  Location: Patient: Virtual  Visit Location Patient: Home Provider: Virtual Visit Location Provider: Home Office   I discussed the limitations of evaluation and management by telemedicine and the availability of in person appointments. The patient expressed understanding and agreed to proceed.    History of Present Illness: Brianna Barr is a 26 y.o. who identifies as a female who was assigned female at birth, and is being seen today for possible reaction following Covid 19 vaccination.  HPI: Allergic Reaction This is a new problem. The current episode started 2 days ago (Had newest covid 19 vaccine [employer]) on wednesday (07/30/22); Had arm soreness start about 4 hours after getting the vaccine (woke her from sleep), then increased to full arm pain, limited ROM, and neck stiffness within 24 hours). The problem occurs constantly. The problem is unchanged. The problem is moderate. The patient was exposed to a prescription drug (Newest fall 2023 Pfizer Covid 19 vaccine). The time of exposure was just prior to onset. Associated symptoms include itching and a rash. Pertinent negatives include no abdominal pain, chest pain, chest pressure, difficulty breathing, eye itching, eye redness, trouble swallowing or wheezing. (Arm pain, limited ROM, neck stiffness, arm swelling, redness, itching) Swelling location: Arm. Past treatments include oral corticosteroid, topical corticosteroid and rest (hydroxyzine, prednisone (on both for unrelated issues)). The treatment provided mild relief.    Already on Prednisone 8 day taper from Pharmacy for unrelated issue. On Hydroxyzine daily for anxiety.  Problems:  Patient Active Problem List   Diagnosis Date Noted   Anxiety and  depression 07/07/2019   Mild intermittent asthma without complication 16/07/9603   Seasonal allergies 07/07/2019   Plantar fasciitis 07/07/2019   Hyperhidrosis 07/07/2019   OAB (overactive bladder) 07/07/2019   Allergy 08/06/2018    Allergies: No Known  Allergies Medications:  Current Outpatient Medications:    cephALEXin (KEFLEX) 500 MG capsule, Take 1 capsule (500 mg total) by mouth 4 (four) times daily., Disp: 20 capsule, Rfl: 0   famotidine (PEPCID) 20 MG tablet, Take 1 tablet (20 mg total) by mouth 2 (two) times daily., Disp: 30 tablet, Rfl: 0   albuterol (VENTOLIN HFA) 108 (90 Base) MCG/ACT inhaler, Inhale 2 puffs by mouth into the lungs every 6 (six) hours as needed for wheezing or shortness of breath., Disp: 6.7 g, Rfl: 4   buPROPion (WELLBUTRIN XL) 150 MG 24 hr tablet, Take 3 tablets (450 mg total) by mouth every morning., Disp: 270 tablet, Rfl: 3   cyclobenzaprine (FLEXERIL) 5 MG tablet, Take 1 tablet (5 mg total) by mouth at bedtime as needed for muscle spasms. (Patient not taking: Reported on 06/05/2022), Disp: 30 tablet, Rfl: 1   escitalopram (LEXAPRO) 20 MG tablet, Take 1 tablet (20 mg total) by mouth daily., Disp: 90 tablet, Rfl: 3   hydrOXYzine (ATARAX) 25 MG tablet, Take 1 tablet (25 mg total) by mouth 3 (three) times daily as needed., Disp: 270 tablet, Rfl: 3   levonorgestrel (MIRENA) 20 MCG/DAY IUD, 1 each by Intrauterine route once., Disp: , Rfl:    linaclotide (LINZESS) 145 MCG CAPS capsule, Take 1 capsule (145 mcg total) by mouth daily before breakfast., Disp: 90 capsule, Rfl: 3   montelukast (SINGULAIR) 10 MG tablet, Take 1 tablet (10 mg total) by mouth at bedtime., Disp: 90 tablet, Rfl: 0   oxybutynin (DITROPAN-XL) 10 MG 24 hr tablet, Take 1 tablet (10 mg total) by mouth daily. (Patient not taking: Reported on 01/14/2022), Disp: 30 tablet, Rfl: 5   predniSONE (DELTASONE) 10 MG tablet, 4 tablets (40 mg total) daily for 2 days, THEN 3 tablets (30 mg total) daily for 2 days, THEN 2 tablets (20 mg total) daily for 2 days, THEN 1 tablet (10 mg total) daily for 2 days., Disp: 20 tablet, Rfl: 0   propranolol (INDERAL) 10 MG tablet, Take 1 tablet (10 mg total) by mouth 3 (three) times daily., Disp: 270 tablet, Rfl: 3   Vitamin D,  Ergocalciferol, (DRISDOL) 1.25 MG (50000 UNIT) CAPS capsule, Take 1 capsule (50,000 Units total) by mouth every 7 (seven) days., Disp: 12 capsule, Rfl: 0  Observations/Objective: Patient is well-developed, well-nourished in no acute distress.  Resting comfortably at home.  Head is normocephalic, atraumatic.  No labored breathing.  Speech is clear and coherent with logical content.  Patient is alert and oriented at baseline.    Assessment and Plan: 1. Allergic reaction, initial encounter - cephALEXin (KEFLEX) 500 MG capsule; Take 1 capsule (500 mg total) by mouth 4 (four) times daily.  Dispense: 20 capsule; Refill: 0 - famotidine (PEPCID) 20 MG tablet; Take 1 tablet (20 mg total) by mouth 2 (two) times daily.  Dispense: 30 tablet; Refill: 0  - Allergic reaction from Fall 2023 [employer] Covid 19 vaccine - Continue prednisone and hydroxyzine as prescribed - Add Famotidine 20mg  BID - Discussed adding daily non-drowsy antihistamine such as Claritin, Zyrtec, Allegra - Keflex for secondary skin cellulitis from swelling - Ice  - Seek in person evaluation if symptoms continue to worsen or fail to improve  Follow Up Instructions: I discussed the assessment  and treatment plan with the patient. The patient was provided an opportunity to ask questions and all were answered. The patient agreed with the plan and demonstrated an understanding of the instructions.  A copy of instructions were sent to the patient via MyChart unless otherwise noted below.    The patient was advised to call back or seek an in-person evaluation if the symptoms worsen or if the condition fails to improve as anticipated.  Time:  I spent 13 minutes with the patient via telehealth technology discussing the above problems/concerns.    Mar Daring, PA-C

## 2022-08-01 NOTE — Patient Instructions (Signed)
Arien Dionne Roorda, thank you for joining Mar Daring, PA-C for today's virtual visit.  While this provider is not your primary care provider (PCP), if your PCP is located in our provider database this encounter information will be shared with them immediately following your visit.  Consent: (Patient) Shaianne Dionne Sasaki provided verbal consent for this virtual visit at the beginning of the encounter.  Current Medications:  Current Outpatient Medications:    cephALEXin (KEFLEX) 500 MG capsule, Take 1 capsule (500 mg total) by mouth 4 (four) times daily., Disp: 20 capsule, Rfl: 0   famotidine (PEPCID) 20 MG tablet, Take 1 tablet (20 mg total) by mouth 2 (two) times daily., Disp: 30 tablet, Rfl: 0   albuterol (VENTOLIN HFA) 108 (90 Base) MCG/ACT inhaler, Inhale 2 puffs by mouth into the lungs every 6 (six) hours as needed for wheezing or shortness of breath., Disp: 6.7 g, Rfl: 4   buPROPion (WELLBUTRIN XL) 150 MG 24 hr tablet, Take 3 tablets (450 mg total) by mouth every morning., Disp: 270 tablet, Rfl: 3   cyclobenzaprine (FLEXERIL) 5 MG tablet, Take 1 tablet (5 mg total) by mouth at bedtime as needed for muscle spasms. (Patient not taking: Reported on 06/05/2022), Disp: 30 tablet, Rfl: 1   escitalopram (LEXAPRO) 20 MG tablet, Take 1 tablet (20 mg total) by mouth daily., Disp: 90 tablet, Rfl: 3   hydrOXYzine (ATARAX) 25 MG tablet, Take 1 tablet (25 mg total) by mouth 3 (three) times daily as needed., Disp: 270 tablet, Rfl: 3   levonorgestrel (MIRENA) 20 MCG/DAY IUD, 1 each by Intrauterine route once., Disp: , Rfl:    linaclotide (LINZESS) 145 MCG CAPS capsule, Take 1 capsule (145 mcg total) by mouth daily before breakfast., Disp: 90 capsule, Rfl: 3   montelukast (SINGULAIR) 10 MG tablet, Take 1 tablet (10 mg total) by mouth at bedtime., Disp: 90 tablet, Rfl: 0   oxybutynin (DITROPAN-XL) 10 MG 24 hr tablet, Take 1 tablet (10 mg total) by mouth daily. (Patient not taking: Reported on  01/14/2022), Disp: 30 tablet, Rfl: 5   predniSONE (DELTASONE) 10 MG tablet, 4 tablets (40 mg total) daily for 2 days, THEN 3 tablets (30 mg total) daily for 2 days, THEN 2 tablets (20 mg total) daily for 2 days, THEN 1 tablet (10 mg total) daily for 2 days., Disp: 20 tablet, Rfl: 0   propranolol (INDERAL) 10 MG tablet, Take 1 tablet (10 mg total) by mouth 3 (three) times daily., Disp: 270 tablet, Rfl: 3   Vitamin D, Ergocalciferol, (DRISDOL) 1.25 MG (50000 UNIT) CAPS capsule, Take 1 capsule (50,000 Units total) by mouth every 7 (seven) days., Disp: 12 capsule, Rfl: 0   Medications ordered in this encounter:  Meds ordered this encounter  Medications   cephALEXin (KEFLEX) 500 MG capsule    Sig: Take 1 capsule (500 mg total) by mouth 4 (four) times daily.    Dispense:  20 capsule    Refill:  0    Order Specific Question:   Supervising Provider    Answer:   Chase Picket [[id number]]   famotidine (PEPCID) 20 MG tablet    Sig: Take 1 tablet (20 mg total) by mouth 2 (two) times daily.    Dispense:  30 tablet    Refill:  0    Order Specific Question:   Supervising Provider    Answer:   Chase Picket [id number]     *If you need refills on other medications prior to your next  appointment, please contact your pharmacy*  Follow-Up: Call back or seek an in-person evaluation if the symptoms worsen or if the condition fails to improve as anticipated.  [facility] [phone]  Other Instructions  Drug Allergy  A drug allergy happens when the body's disease-fighting system (immune system) reacts badly to a medicine. Drug allergies range from mild to severe. A drug allergy is not the same as a medicine side effect, which is a known possible reaction to the drug. A drug allergy is also different from medicine toxicity caused by an overdose of the drug. The time of an allergic reaction varies. Symptoms often appear between 1 to 2 hours after taking the medicine; however, some  allergic reactions occur 1 week or more after you are exposed to a medicine (delayed reaction). A sudden (acute), severe allergic reaction that affects multiple areas of the body is called an anaphylactic reaction (anaphylaxis). Anaphylaxis can be life-threatening. All allergic reactions to a medicine require medical evaluation, even if the allergic reaction appears to be mild. What are the causes? This condition is caused by the immune system wrongly identifying a medication as being harmful. When this happens, the body releases proteins (antibodies) and other compounds, such as histamine, into the bloodstream. This causes swelling in certain tissues and reduces blood flow to important areas, such as the heart and lungs. Almost any medicine can cause an allergic reaction. Medicines that commonly cause allergic reactions (common allergens) include: Antibiotics, such as penicillin. Sulfa medicines (sulfonamides). Medicines that numb certain areas of the body (local anesthetics). X-ray dyes that contain iodine. Pain-relievers. This includes aspirin and NSAIDs, such as ibuprofen or naproxen sodium. Chemotherapy drugs for treating cancer. Medicines for autoimmune diseases, such as rheumatoid arthritis. What are the signs or symptoms? Common symptoms of a mild allergic reaction include: Nasal congestion. Tingling in the mouth or tongue. An itchy, red rash. Common symptoms of a severe allergic reaction include: Swelling of the face, eyes, lips, or tongue, including the back of the mouth and throat. Difficulty speaking (hoarseness) or swallowing, or making high-pitched whistling sounds, most often when you breathe out (wheezing). Itchy, red, swollen areas of skin (hives). Dizziness, light-headedness, or fainting. Anxiety or confusion. Chest tightness and fast or irregular heartbeats (palpitations). Abdominal pain, vomiting, or diarrhea. How is this diagnosed? This condition is diagnosed based on a  physical exam and your history of recent exposure to one or more medicines. You may be referred for follow-up testing by a health care provider who specializes in allergies. This testing can confirm the diagnosis of a drug allergy and determine which medicines you are allergic to. Testing may include: Skin tests. These may involve: Injecting a small amount of the possible allergen between layers of your skin (intradermal injection). Applying patches to your skin. Blood tests. Drug challenge. For this test, a health care provider gives you a small amount of a medicine in gradual doses while watching for an allergic reaction. If you are unsure of what caused your allergic reaction, your health care provider may ask you for: Information about all medicines that you take on a regular basis. The date and time of your reaction. How is this treated? There is no cure for allergies. However, an allergic reaction can be treated with: Medicines that help: Reduce pain and swelling (NSAIDs). Relieve itching and hives (antihistamines). Reduce swelling (corticosteroids). Respiratory inhalers. These are inhaled medicines that help open (dilate) the airways in your lungs. Injections of medicine that helps to relax  the muscles in your airways and tighten your blood vessels (epinephrine). Severe allergic reactions, such as anaphylaxis, require immediate treatment in a hospital. You may need to be hospitalized for observation. You may also be prescribed rescue medicines, such as epinephrine. Epinephrine comes in many forms, including what is commonly called an auto-injector "pen" (pre-filled automatic epinephrine injection device). Follow these instructions at home: If you have a severe allergy  Always keep an auto-injector pen or your anaphylaxis kit near you. This can be lifesaving if you have a severe reaction. Use your auto-injector pen or anaphylaxis kit as told by your health care provider. Make sure that  you, the members of your household, and your employer know: How to use your auto-injector pen or anaphylaxis kit. How to use your auto-injector pen to give you an epinephrine injection. Replace your auto-injector pen or anaphylaxis kit immediately after use, in case you have another reaction. Wear a medical alert bracelet or necklace that states your drug allergy, if told by your health care provider. General instructions Avoid medicines that you are allergic to. Take over-the-counter and prescription medicines only as told by your health care provider. If you were given medicines to treat your allergic reaction, do not drive until your health care provider tells you it is safe. If you have hives or a rash: Use an over-the-counter antihistamine as told by your health care provider. Apply cold, wet cloths (cold compresses) to your skin or take baths or showers in cool water. Avoid hot water. If you had tests done, it is up to you to get your test results. Ask your health care provider when your results will be ready. Tell all your health care providers that you have a drug allergy. Keep all follow-up visits This is important. Contact a health care provider if: You think that you are having a mild allergic reaction. Symptoms of an allergic reaction usually start within 1 hour after you are exposed to a medicine. You have symptoms that last more than 2 days after your reaction. You develop new signs or symptoms. Get help right away if: You needed to use epinephrine. An epinephrine injection helps to manage life-threatening allergic reactions, but you still need to go to the emergency room even if epinephrine seems to work. This is important because anaphylaxis may happen again within 72 hours (rebound anaphylaxis). If you used epinephrine to treat anaphylaxis outside of the hospital, you need additional medical care. This may include more doses of epinephrine. You develop signs or symptoms of a  severe allergic reaction. These symptoms may represent a serious problem that is an emergency. Do not wait to see if the symptoms will go away. Use your auto-injector pen or anaphylaxis kit as you have been instructed, and get medical help right away. Call your local emergency services (911 in the [place] Do not drive yourself to the hospital. Summary A drug allergy happens when the body's disease-fighting system reacts badly to a medicine. Drug allergies range from mild to severe. In some cases, an allergic reaction may be life-threatening. If you have a severe allergy, always keep an auto-injector pen or your anaphylaxis kit near you. This information is not intended to replace advice given to you by your health care provider. Make sure you discuss any questions you have with your health care provider. Document Revised: 03/25/2021 Document Reviewed: 03/25/2021 Elsevier Patient Education  [employer].    If you have been instructed to have an in-person evaluation today at a local  Urgent Care facility, please use the link below. It will take you to a list of all of our available [facility], including address, phone number and hours of operation. Please do not delay care.  [facility]  If you or a family member do not have a primary care provider, use the link below to schedule a visit and establish care. When you choose a [facility] primary care physician or advanced practice provider, you gain a long-term partner in health. Find a Primary Care Provider  Learn more about [facility]'s in-office and virtual care options: [facility]

## 2022-08-12 ENCOUNTER — Ambulatory Visit (INDEPENDENT_AMBULATORY_CARE_PROVIDER_SITE_OTHER): Payer: 59 | Admitting: Adult Health

## 2022-08-12 ENCOUNTER — Ambulatory Visit (INDEPENDENT_AMBULATORY_CARE_PROVIDER_SITE_OTHER): Payer: 59 | Admitting: Psychiatry

## 2022-08-12 ENCOUNTER — Encounter: Payer: Self-pay | Admitting: Adult Health

## 2022-08-12 ENCOUNTER — Other Ambulatory Visit (HOSPITAL_BASED_OUTPATIENT_CLINIC_OR_DEPARTMENT_OTHER): Payer: Self-pay

## 2022-08-12 DIAGNOSIS — F411 Generalized anxiety disorder: Secondary | ICD-10-CM | POA: Diagnosis not present

## 2022-08-12 DIAGNOSIS — F410 Panic disorder [episodic paroxysmal anxiety] without agoraphobia: Secondary | ICD-10-CM | POA: Diagnosis not present

## 2022-08-12 DIAGNOSIS — F331 Major depressive disorder, recurrent, moderate: Secondary | ICD-10-CM | POA: Diagnosis not present

## 2022-08-12 DIAGNOSIS — G4700 Insomnia, unspecified: Secondary | ICD-10-CM | POA: Diagnosis not present

## 2022-08-12 MED ORDER — BUSPIRONE HCL 10 MG PO TABS
10.0000 mg | ORAL_TABLET | Freq: Three times a day (TID) | ORAL | 2 refills | Status: DC
  Filled 2022-08-12: qty 90, 30d supply, fill #0

## 2022-08-12 MED ORDER — DIAZEPAM 10 MG PO TABS
ORAL_TABLET | ORAL | 0 refills | Status: DC
  Filled 2022-08-12: qty 2, 1d supply, fill #0

## 2022-08-12 NOTE — Progress Notes (Signed)
Brianna Barr [id number] 10-24-1996 26 y.o.  Subjective:   Patient ID:  Brianna Barr is a 26 y.o. (DOB 1995/11/10) female.  Chief Complaint: No chief complaint on file.   HPI Brianna Barr presents to the office today for follow-up of MDD, GAD, panic attacks, and insomnia.  Describes mood today as "so-so". Pleasant. Tearful at times. Mood symptoms - reports decreased depression. Reports increased anxiety. Feels irritable at times. Reports worry and rumination. Reports compulsiveness - "type A personality". Feels like if she doesn't do things the "correct" way , it messes up her focus - effecting work and home. Reports panic attacks - 3 to 4 times a week. Stating "I'm not doing as well as I was". Willing to consider other options. Decreased interest and motivation. Taking medications as prescribed. Seeing therapist bi-weekly - Brianna Barr. Energy levels "ok, not great". Active, has a regular exercise routine. Walking 10 to 15,000 steps a day at work. Enjoys some usual interests and activities. Single. Lives alone. Parents and 2 younger sisters local. Spending time with family. Has a core group of 3 friends. Appetite adequate. Weight stable. Sleeps better some nights than others. Averages 4 to 7 hours. Focus and concentration "ok" Completing tasks. Managing aspects of household. Works as a Marine scientist at [employer].  Denies SI or HI.  Denies AH or VH.  Previous medication trials: Zoloft in college, Viibryd, [place] Office Visit from 06/05/2022 in [place] at [facility] from 06/25/2020 in [place] at [facility] from 11/03/2019 in [place] at [facility] from 09/29/2019 in [place] at [facility] from 08/18/2019 in [place] at [facility]  Total GAD-7 Score 16 18 7 17 19       [facility] Office Visit from 07/21/2022 in [place] at [facility] from 06/05/2022 in [place] at [facility] from 12/19/2021 in [place] at [facility] from 11/13/2021 in [place] at [facility] from 06/25/2020 in [place] at [facility] Total Score 2 2 3 4 4   PHQ-9 Total Score 11 11 11 15 16       Flowsheet Row ED from 06/06/2021 in [facility] from 03/14/2021 in [facility] No Risk No Risk        Review of Systems:  Review of Systems  Musculoskeletal:  Negative for gait problem.  Neurological:  Negative for tremors.  Psychiatric/Behavioral:         Please refer to HPI    Medications: I have reviewed the patient's current medications.  Current Outpatient Medications  Medication Sig Dispense Refill   busPIRone (BUSPAR) 10 MG tablet Take 1 tablet (10 mg total) by mouth 3 (three) times daily. 90 tablet 2   albuterol (VENTOLIN HFA) 108 (90 Base) MCG/ACT inhaler Inhale 2 puffs by mouth into the lungs every 6 (six) hours as needed for wheezing or shortness of breath. 6.7 g 4   buPROPion (WELLBUTRIN XL) 150 MG 24 hr tablet Take 3 tablets (450 mg total) by mouth every morning. 270 tablet 3   cephALEXin (KEFLEX) 500 MG capsule Take 1 capsule (500 mg total) by mouth 4 (four) times daily. 20 capsule 0   cyclobenzaprine (FLEXERIL) 5 MG tablet Take 1 tablet (5 mg total) by mouth at bedtime as needed for muscle spasms. (  Patient not taking: Reported on 06/05/2022) 30 tablet 1   escitalopram (LEXAPRO) 20 MG tablet Take 1 tablet (20 mg total) by mouth daily. 90 tablet 3   famotidine (PEPCID) 20 MG tablet Take 1 tablet (20 mg total) by mouth 2 (two) times daily. 30 tablet 0   hydrOXYzine (ATARAX) 25 MG tablet Take 1 tablet (25 mg total) by mouth 3 (three) times daily as needed. 270 tablet 3   levonorgestrel (MIRENA) 20 MCG/DAY IUD 1 each by Intrauterine route once.      linaclotide (LINZESS) 145 MCG CAPS capsule Take 1 capsule (145 mcg total) by mouth daily before breakfast. 90 capsule 3   montelukast (SINGULAIR) 10 MG tablet Take 1 tablet (10 mg total) by mouth at bedtime. 90 tablet 0   oxybutynin (DITROPAN-XL) 10 MG 24 hr tablet Take 1 tablet (10 mg total) by mouth daily. (Patient not taking: Reported on 01/14/2022) 30 tablet 5   propranolol (INDERAL) 10 MG tablet Take 1 tablet (10 mg total) by mouth 3 (three) times daily. 270 tablet 3   Vitamin D, Ergocalciferol, (DRISDOL) 1.25 MG (50000 UNIT) CAPS capsule Take 1 capsule (50,000 Units total) by mouth every 7 (seven) days. 12 capsule 0   No current facility-administered medications for this visit.    Medication Side Effects: None  Allergies:  Allergies  Allergen Reactions   Covid-19 (Mrna Bivalent) Vaccine Proofreader) [Covid-19 (Mrna) Vaccine] Other (See Comments)    Arm pain, stiffness in arm and neck, redness and swelling of arm, severe itching    Past Medical History:  Diagnosis Date   Anxiety    Asthma    Depression    Overactive bladder    Stress incontinence     Past Medical History, Surgical history, Social history, and Family history were reviewed and updated as appropriate.   Please see review of systems for further details on the patient's review from today.   Objective:   Physical Exam:  There were no vitals taken for this visit.  Physical Exam Constitutional:      General: She is not in acute distress. Musculoskeletal:        General: No deformity.  Neurological:     Mental Status: She is alert and oriented to person, place, and time.     Coordination: Coordination normal.  Psychiatric:        Attention and Perception: Attention and perception normal. She does not perceive auditory or visual hallucinations.        Mood and Affect: Mood normal. Mood is not anxious or depressed. Affect is not labile, blunt, angry or inappropriate.        Speech: Speech normal.        Behavior:  Behavior normal.        Thought Content: Thought content normal. Thought content is not paranoid or delusional. Thought content does not include homicidal or suicidal ideation. Thought content does not include homicidal or suicidal plan.        Cognition and Memory: Cognition and memory normal.        Judgment: Judgment normal.     Comments: Insight intact     Lab Review:     Component Value Date/Time   NA 136 06/05/2022 1038   K 3.7 06/05/2022 1038   CL 104 06/05/2022 1038   CO2 22 06/05/2022 1038   GLUCOSE 78 06/05/2022 1038   BUN 11 06/05/2022 1038   CREATININE 0.87 06/05/2022 1038   CALCIUM 9.1 06/05/2022 1038   PROT 7.6 06/05/2022  1038   ALBUMIN 4.3 06/05/2022 1038   AST 17 06/05/2022 1038   ALT 14 06/05/2022 1038   ALKPHOS 96 06/05/2022 1038   BILITOT 0.3 06/05/2022 1038       Component Value Date/Time   WBC 8.7 06/05/2022 1038   RBC 5.03 06/05/2022 1038   HGB 13.8 06/05/2022 1038   HCT 41.9 06/05/2022 1038   PLT 322.0 06/05/2022 1038   MCV 83.3 06/05/2022 1038   MCH 23.4 (L) 10/03/2020 1146   MCHC 33.0 06/05/2022 1038   RDW 13.5 06/05/2022 1038   LYMPHSABS 2.2 06/05/2022 1038   MONOABS 0.4 06/05/2022 1038   EOSABS 0.1 06/05/2022 1038   BASOSABS 0.0 06/05/2022 1038    No results found for: "POCLITH", "LITHIUM"   No results found for: "PHENYTOIN", "PHENOBARB", "VALPROATE", "CBMZ"   .res Assessment: Plan:    Plan:  PDMP reviewed  Hydroxyzine 25mg  - 1 to 2 at bedtime. Wellbutrin XL 450mg  every morning. Denies seizure history. Propranolol 10mg  TID Lexapro 20mg  daily  RTC 4 weeks  Consider a different SSRI if buspar is ineffective.  Patient advised to contact office with any questions, adverse effects, or acute worsening in signs and symptoms.   RTC 4 weeks  Diagnoses and all orders for this visit:  Major depressive disorder, recurrent episode, moderate (HCC)  Generalized anxiety disorder -     busPIRone (BUSPAR) 10 MG tablet; Take 1 tablet  (10 mg total) by mouth 3 (three) times daily.  Panic attacks  Insomnia, unspecified type     Please see After Visit Summary for patient specific instructions.  Future Appointments  Date Time Provider Department Center  08/12/2022  9:00 AM , LCSW [facility] None  08/27/2022 10:00 AM , LCSW [facility] None  09/09/2022 10:00 AM Mathis Fare, LCSW CP-CP None  09/23/2022  9:00 AM Mathis Fare, LCSW CP-CP None    No orders of the defined types were placed in this encounter.   -------------------------------
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
## 2022-08-18 ENCOUNTER — Encounter: Payer: Self-pay | Admitting: *Deleted

## 2022-08-27 ENCOUNTER — Ambulatory Visit (INDEPENDENT_AMBULATORY_CARE_PROVIDER_SITE_OTHER): Payer: 59 | Admitting: Psychiatry

## 2022-08-27 ENCOUNTER — Encounter: Payer: Self-pay | Admitting: Physical Therapy

## 2022-08-27 ENCOUNTER — Other Ambulatory Visit: Payer: Self-pay

## 2022-08-27 ENCOUNTER — Ambulatory Visit: Payer: 59 | Attending: Sports Medicine | Admitting: Physical Therapy

## 2022-08-27 DIAGNOSIS — M79661 Pain in right lower leg: Secondary | ICD-10-CM | POA: Insufficient documentation

## 2022-08-27 DIAGNOSIS — M6281 Muscle weakness (generalized): Secondary | ICD-10-CM

## 2022-08-27 DIAGNOSIS — M79662 Pain in left lower leg: Secondary | ICD-10-CM | POA: Diagnosis not present

## 2022-08-27 DIAGNOSIS — M5416 Radiculopathy, lumbar region: Secondary | ICD-10-CM

## 2022-08-27 DIAGNOSIS — M5459 Other low back pain: Secondary | ICD-10-CM

## 2022-08-27 DIAGNOSIS — F411 Generalized anxiety disorder: Secondary | ICD-10-CM | POA: Diagnosis not present

## 2022-08-27 DIAGNOSIS — R293 Abnormal posture: Secondary | ICD-10-CM

## 2022-08-27 NOTE — Therapy (Signed)
OUTPATIENT PHYSICAL THERAPY THORACOLUMBAR EVALUATION   Patient Name: Brianna Barr MRN: [id number] DOB:1996/10/19, 26 y.o., female Today's Date: 08/27/2022   PT End of Session - 08/27/22 1246     Visit Number 1    Date for PT Re-Evaluation 10/22/22    Authorization Type [facility] - review after 25 visits    Authorization - Visit Number 1    Authorization - Number of Visits 25    PT Start Time 1145    PT Stop Time 1230    PT Time Calculation (min) 45 min    Activity Tolerance Patient tolerated treatment well    Behavior During Therapy WFL for tasks assessed/performed             Past Medical History:  Diagnosis Date   Anxiety    Asthma    Depression    Overactive bladder    Stress incontinence    Past Surgical History:  Procedure Laterality Date   WISDOM TOOTH EXTRACTION     Patient Active Problem List   Diagnosis Date Noted   Anxiety and depression 07/07/2019   Mild intermittent asthma without complication 07/07/2019   Seasonal allergies 07/07/2019   Plantar fasciitis 07/07/2019   Hyperhidrosis 07/07/2019   OAB (overactive bladder) 07/07/2019   Allergy 08/06/2018    PCP: Abbe Amsterdam, MD  REFERRING PROVIDER: Delfin Gant, MD   REFERRING DIAG: [phone] (ICD-10-CM) - Pain in right lower leg M79.662 (ICD-10-CM) - Pain in left lower leg   Rationale for Evaluation and Treatment: Rehabilitation  THERAPY DIAG:  Other low back pain  Radiculopathy, lumbar region  Abnormal posture  Muscle weakness (generalized)  ONSET DATE: chronic, worsened more recently, has had flares in past but this time not improving  SUBJECTIVE:                                                                                                                                                                                           SUBJECTIVE STATEMENT: End of Aug worsening of LBP and pins/needles down bil anterior thigh and leg, stopping before ankles.  Prolonged standing  and bending/twisting will bring on LE symptoms.  Can still get symptoms in legs sitting but not as often.  PERTINENT HISTORY:  N/A, has done PT before  PAIN:  PAIN:  Are you having pain? Yes NPRS scale: 3-8/10 Pain location: central lumbar and spreads bil  Pain orientation: Right, Left, and Lower  PAIN TYPE: aching, dull, and sharp Pain description: constant  Aggravating factors: prolonged standing, twist/bend Relieving factors: heat, Biofreeze, OTC meds   PRECAUTIONS: None  WEIGHT BEARING RESTRICTIONS: No  FALLS:  Has patient fallen in last 6 months? No  LIVING ENVIRONMENT: Lives with: lives alone Lives in: House/apartment Stairs: No Has following equipment at home: None  OCCUPATION: nurse at [employer]  PLOF: Independent  PATIENT GOALS: less pain   OBJECTIVE:   DIAGNOSTIC FINDINGS:  MRI lumbar 2 weeks ago: per Pt mild degeneration - has been referred to neurologist - results not in chart  PATIENT SURVEYS:  FOTO 51% goal 64%  SCREENING FOR RED FLAGS: Bowel or bladder incontinence: No Spinal tumors: No Cauda equina syndrome: No Compression fracture: No Abdominal aneurysm: No  COGNITION: Overall cognitive status: Within functional limits for tasks assessed     SENSATION: Dull sensation reported through anterior thigh and leg when symptoms present, otherwise normal  MUSCLE LENGTH: Hamstrings: Right 55 deg; Left 70 deg End range piriformis and gluteals limited Rt>Lt   POSTURE: rounded shoulders, increased lumbar lordosis, and anterior pelvic tilt  PALPATION: Diffuse tenderness present  LUMBAR ROM:   AROM eval  Flexion Fingers to ankles, mild LBP  Extension 15 deg, immediate onset pins/needles in bil LE  Right lateral flexion full  Left lateral flexion full  Right rotation 50%  Left rotation 50%   (Blank rows = not tested)  LOWER EXTREMITY ROM:     Passive  Right eval Left eval  Hip flexion 90 deg, pain Full,  normal  Hip extension    Hip abduction    Hip adduction    Hip internal rotation Full, pain full  Hip external rotation 40, pain 50  Knee flexion    Knee extension    Ankle dorsiflexion    Ankle plantarflexion    Ankle inversion    Ankle eversion     (Blank rows = not tested)  LOWER EXTREMITY MMT:   Grossly 4+/5 bil  LUMBAR SPECIAL TESTS:  Straight leg raise test: Negative  FUNCTIONAL TESTS:  Ind with transfers and bed mobility  GAIT: Distance walked: within clinic Assistive device utilized: None Level of assistance: Complete Independence Comments: wide base of support  TODAY'S TREATMENT:                                                                                                                              DATE:   08/27/22: Initiated HEP   PATIENT EDUCATION:  Education details: Access Code: [email or web address] Person educated: Patient Education method: Programmer, multimedia, Facilities manager, Verbal cues, and Handouts Education comprehension: verbalized understanding and returned demonstration  HOME EXERCISE PROGRAM: Access Code: [email or web address] URL: [email or web address] Date: 08/27/2022 Prepared by: Loistine Simas Giannina Bartolome  Exercises - Supine Posterior Pelvic Tilt  - 1 x daily - 7 x weekly - 1 sets - 10 reps - 2 breaths hold - Supine Bridge with Spinal Articulation  - 1 x daily - 7 x weekly - 1 sets - 10 reps - Supine Figure 4 Piriformis Stretch  - 1 x daily - 7 x weekly - 1 sets - 2 reps - 20 hold -  Supine Piriformis Stretch with Foot on Ground  - 7 x weekly - 1 sets - 2 reps - 20 hold - Seated Hamstring Stretch  - 1 x daily - 7 x weekly - 1 sets - 2 reps - 20 hold - Standing Plank on Wall  - 1 x daily - 7 x weekly - 1 sets - 5 reps - 10 hold  ASSESSMENT:  CLINICAL IMPRESSION: Patient is a 26 y.o. female familiar to this clinic who was seen today for physical therapy evaluation and treatment for LBP and bil LE radicular symptoms.  She has had other flares in the past but has  been able to manage and recover.  She has tried HEP from previous PT episode at this clinic but wasn't able to tolerate.  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  LBP is constant and is worse with prolonged standing, twisting and bending.  Some relief in sitting and best position is lying down.  She has immediate onset of LE pins/needles with lumbar extension within clinic, and reports immediate onset of LE symptoms with repeated twisting/bending.  SLR is positive bil for LBP and thigh pain only.  LE strength is grossly 4+/5.  Core strength is very weak.  Pt has been referred from ortho to a neurologist but the appointment is in mid-Dec.  She will benefit from skilled PT to address pain, weakness, and functional limitations to maximize her outcome.  OBJECTIVE IMPAIRMENTS: decreased activity tolerance, decreased mobility, decreased ROM, decreased strength, impaired flexibility, impaired sensation, improper body mechanics, postural dysfunction, and pain.   ACTIVITY LIMITATIONS: carrying, lifting, bending, standing, squatting, and locomotion level  PARTICIPATION LIMITATIONS: meal prep, cleaning, laundry, shopping, community activity, and occupation  PERSONAL FACTORS: Time since onset of injury/illness/exacerbation are also affecting patient's functional outcome.   REHAB POTENTIAL: Good  CLINICAL DECISION MAKING: Stable/uncomplicated  EVALUATION COMPLEXITY: Low   GOALS: Goals reviewed with patient? Yes  SHORT TERM GOALS: Target date: 09/24/2022  Pt will be ind with initial HEP without exacerbation of symptoms.  Baseline: Goal status: INITIAL  2.  Pt will report 20% less pain with work shift Baseline:  Goal status: INITIAL  3.  Pt will demo proper activation of core in static and dynamic postures  Baseline:  Goal status: INITIAL   LONG TERM GOALS: Target date: 10/22/2022  Pt will be ind with advanced HEP and understand how to safely  progress. Baseline:  Goal status: INITIAL  2.  Improve FOTO score to at least 64% to demo improved function. Baseline: 51% Goal status: INITIAL  3.  Pt will normalize LE flexibility of bil hamstrings to improve bending for work and housework tasks. Baseline:  Goal status: INITIAL  4.  Pt will demo strong core stabilization with functional body mechanics for bending, lifting and carrying to protect her back from further injury. Baseline:  Goal status: INITIAL  5.  Pt will report at least 50% reduction in pain with standing tasks at work and home. Baseline:  Goal status: INITIAL   PLAN:  PT FREQUENCY: 1-2x/week  PT DURATION: 8 weeks  PLANNED INTERVENTIONS: Therapeutic exercises, Therapeutic activity, Neuromuscular re-education, Balance training, Gait training, Patient/Family education, Self Care, Joint mobilization, Aquatic Therapy, Dry Needling, Electrical stimulation, Spinal mobilization, Cryotherapy, Moist heat, Taping, Ionotophoresis 4mg /ml Dexamethasone, and Manual therapy.  PLAN FOR NEXT SESSION: NuStep, review HEP, progress with LE stretching and lumbar stabilization as tol, Pt may do best with mat and chair therex to start  Brianna Barr, PT 08/27/22 1:04 PM
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
## 2022-09-09 ENCOUNTER — Ambulatory Visit: Payer: 59 | Admitting: Adult Health

## 2022-09-09 ENCOUNTER — Ambulatory Visit (INDEPENDENT_AMBULATORY_CARE_PROVIDER_SITE_OTHER): Payer: 59 | Admitting: Psychiatry

## 2022-09-09 DIAGNOSIS — F411 Generalized anxiety disorder: Secondary | ICD-10-CM | POA: Diagnosis not present

## 2022-09-09 NOTE — Therapy (Signed)
OUTPATIENT PHYSICAL THERAPY TREATMENT NOTE   Patient Name: Brianna Barr MRN: [id number] DOB:[birth year], 26 y.o., female Today's Date: 09/10/2022   REFERRING PROVIDER: Delfin Gant, MD   END OF SESSION:   PT End of Session - 09/10/22 0847     Visit Number 2    Date for PT Re-Evaluation 10/22/22    Authorization Type [facility] - review after 25 visits    Authorization - Visit Number 2    Authorization - Number of Visits 25    PT Start Time [id number]    PT Stop Time 0927    PT Time Calculation (min) 44 min    Activity Tolerance Patient tolerated treatment well    Behavior During Therapy [facility] for tasks assessed/performed             Past Medical History:  Diagnosis Date   Anxiety    Asthma    Depression    Overactive bladder    Stress incontinence    Past Surgical History:  Procedure Laterality Date   WISDOM TOOTH EXTRACTION     Patient Active Problem List   Diagnosis Date Noted   Anxiety and depression 07/07/2019   Mild intermittent asthma without complication 07/07/2019   Seasonal allergies 07/07/2019   Plantar fasciitis 07/07/2019   Hyperhidrosis 07/07/2019   OAB (overactive bladder) 07/07/2019   Allergy 08/06/2018    REFERRING DIAG: G40.102 (ICD-10-CM) - Pain in right lower leg M79.662 (ICD-10-CM) - Pain in left lower leg   THERAPY DIAG:  Other low back pain  Radiculopathy, lumbar region  Abnormal posture  Muscle weakness (generalized)  Rationale for Evaluation and Treatment Rehabilitation  PERTINENT HISTORY: works as a Engineer, civil (consulting) in the post-partum unit at [employer]  PRECAUTIONS: none  SUBJECTIVE:                                                                                                                                                                                      SUBJECTIVE STATEMENT:  I am doing the HEP stretches and they are helping.    PAIN:  Are you having pain? Yes NPRS scale: 2-6/10, today is  a 4/10 Pain location: central lumbar and spreads bil  Pain orientation: Right, Left, and Lower  PAIN TYPE: aching, dull, and sharp Pain description: constant  Aggravating factors: prolonged standing, twist/bend Relieving factors: heat, Biofreeze, OTC meds   OBJECTIVE: (objective measures completed at initial evaluation unless otherwise dated)     DIAGNOSTIC FINDINGS:  MRI lumbar 2 weeks ago: per Pt mild degeneration - has been referred to neurologist - results not in chart   PATIENT SURVEYS:  FOTO 51% goal 64%  SCREENING FOR RED FLAGS: Bowel or bladder incontinence: No Spinal tumors: No Cauda equina syndrome: No Compression fracture: No Abdominal aneurysm: No   COGNITION: Overall cognitive status: Within functional limits for tasks assessed                          SENSATION: Dull sensation reported through anterior thigh and leg when symptoms present, otherwise normal   MUSCLE LENGTH: Hamstrings: Right 55 deg; Left 70 deg End range piriformis and gluteals limited Rt>Lt     POSTURE: rounded shoulders, increased lumbar lordosis, and anterior pelvic tilt   PALPATION: Diffuse tenderness present   LUMBAR ROM:    AROM eval  Flexion Fingers to ankles, mild LBP  Extension 15 deg, immediate onset pins/needles in bil LE  Right lateral flexion full  Left lateral flexion full  Right rotation 50%  Left rotation 50%   (Blank rows = not tested)   LOWER EXTREMITY ROM:      Passive  Right eval Left eval  Hip flexion 90 deg, pain Full, normal  Hip extension      Hip abduction      Hip adduction      Hip internal rotation Full, pain full  Hip external rotation 40, pain 50  Knee flexion      Knee extension      Ankle dorsiflexion      Ankle plantarflexion      Ankle inversion      Ankle eversion       (Blank rows = not tested)   LOWER EXTREMITY MMT:   Grossly 4+/5 bil   LUMBAR SPECIAL TESTS:  Straight leg raise test: Negative   FUNCTIONAL TESTS:  Ind with  transfers and bed mobility   GAIT: Distance walked: within clinic Assistive device utilized: None Level of assistance: Complete Independence Comments: wide base of support   TODAY'S TREATMENT:                                                                                                                              DATE:   09/10/22: NuStep L3 x 6' PT present to discuss symptoms Quadruped child's pose with mad cat combo x 5 cycles Child's pose with SB 2x10" each way SL clam green tband tied at knees 1x10 with TA indraw Supine pelvic tilt x 5, then articulating bridge with bil hip abd x 10, green tied band at knees Supine piriformis stretch 2x20" bil Wall plank 2x10", then wall push ups x 5 Standing red tband bil UE flexion to neutral 10x5" holds for core awareness Standing red tband bil UE extension to neutral 10x5" holds for core awareness Intro reps for reverse chair sit ups - more next time HEP updated and printed Neuro re-ed: PT with TC and VC with demo for proper postural set up and strategies for improved core awareness throughout session    08/27/22: Initiated HEP  PATIENT EDUCATION:  Education details: Access Code: [email or web address] Person educated: Patient Education method: Programmer, multimedia, Facilities manager, Verbal cues, and Handouts Education comprehension: verbalized understanding and returned demonstration   HOME EXERCISE PROGRAM: Access Code: [email or web address] URL: [email or web address] Date: 09/10/2022 Prepared by: Loistine Simas Neal Trulson  Exercises - Supine Posterior Pelvic Tilt  - 1 x daily - 7 x weekly - 1 sets - 10 reps - 2 breaths hold - Supine Figure 4 Piriformis Stretch  - 1 x daily - 7 x weekly - 1 sets - 2 reps - 20 hold - Supine Piriformis Stretch with Foot on Ground  - 7 x weekly - 1 sets - 2 reps - 20 hold - Seated Hamstring Stretch  - 1 x daily - 7 x weekly - 1 sets - 2 reps - 20 hold - Standing Plank on Wall  - 1 x daily - 7 x weekly - 1 sets - 5 reps - 10  hold - Child's Pose Stretch  - 1 x daily - 7 x weekly - 1 sets - 5 reps - 5 hold - Child's Pose with Sidebending  - 1 x daily - 7 x weekly - 1 sets - 5 reps - 5 hold - Clamshell with Resistance  - 1 x daily - 7 x weekly - 2 sets - 10 reps - Bridge with Hip Abduction and Resistance  - 1 x daily - 7 x weekly - 2 sets - 10 reps - Standing Shoulder Extension with Resistance  - 1 x daily - 7 x weekly - 1 sets - 10 reps - 5 hold   ASSESSMENT:   CLINICAL IMPRESSION: Patient has been compliant with HEP and reports stretches and wall plank are helping.  She particularly connects well with wall plank as she is weak in her core and PF muscles and feels this position really helps her awareness of her core.  PT progressed spine stretching and core stabilization today with good tolerance.  Updated HEP.     OBJECTIVE IMPAIRMENTS: decreased activity tolerance, decreased mobility, decreased ROM, decreased strength, impaired flexibility, impaired sensation, improper body mechanics, postural dysfunction, and pain.    ACTIVITY LIMITATIONS: carrying, lifting, bending, standing, squatting, and locomotion level   PARTICIPATION LIMITATIONS: meal prep, cleaning, laundry, shopping, community activity, and occupation   PERSONAL FACTORS: Time since onset of injury/illness/exacerbation are also affecting patient's functional outcome.    REHAB POTENTIAL: Good   CLINICAL DECISION MAKING: Stable/uncomplicated   EVALUATION COMPLEXITY: Low     GOALS: Goals reviewed with patient? Yes   SHORT TERM GOALS: Target date: 09/24/2022   Pt will be ind with initial HEP without exacerbation of symptoms.  Baseline: Goal status: ongoing   2.  Pt will report 20% less pain with work shift Baseline:  Goal status: INITIAL   3.  Pt will demo proper activation of core in static and dynamic postures  Baseline:  Goal status: INITIAL     LONG TERM GOALS: Target date: 10/22/2022   Pt will be ind with advanced HEP and  understand how to safely progress. Baseline:  Goal status: INITIAL   2.  Improve FOTO score to at least 64% to demo improved function. Baseline: 51% Goal status: INITIAL   3.  Pt will normalize LE flexibility of bil hamstrings to improve bending for work and housework tasks. Baseline:  Goal status: INITIAL   4.  Pt will demo strong core stabilization with functional body mechanics for bending, lifting and carrying to protect her back from further injury. Baseline:  Goal  status: INITIAL   5.  Pt will report at least 50% reduction in pain with standing tasks at work and home. Baseline:  Goal status: INITIAL     PLAN:   PT FREQUENCY: 1-2x/week   PT DURATION: 8 weeks   PLANNED INTERVENTIONS: Therapeutic exercises, Therapeutic activity, Neuromuscular re-education, Balance training, Gait training, Patient/Family education, Self Care, Joint mobilization, Aquatic Therapy, Dry Needling, Electrical stimulation, Spinal mobilization, Cryotherapy, Moist heat, Taping, Ionotophoresis 4mg /ml Dexamethasone, and Manual therapy.   PLAN FOR NEXT SESSION: NuStep, review HEP, progress with LE stretching and lumbar stabilization as tol, Pt may do best with mat and chair therex to start   Yee Joss, PT 09/10/22 9:30 AM
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
## 2022-09-10 ENCOUNTER — Encounter: Payer: Self-pay | Admitting: Physical Therapy

## 2022-09-10 ENCOUNTER — Ambulatory Visit: Payer: 59 | Admitting: Physical Therapy

## 2022-09-10 DIAGNOSIS — M5416 Radiculopathy, lumbar region: Secondary | ICD-10-CM

## 2022-09-10 DIAGNOSIS — M6281 Muscle weakness (generalized): Secondary | ICD-10-CM

## 2022-09-10 DIAGNOSIS — R293 Abnormal posture: Secondary | ICD-10-CM

## 2022-09-10 DIAGNOSIS — M5459 Other low back pain: Secondary | ICD-10-CM

## 2022-09-10 DIAGNOSIS — M79661 Pain in right lower leg: Secondary | ICD-10-CM | POA: Diagnosis not present

## 2022-09-17 ENCOUNTER — Encounter: Payer: Self-pay | Admitting: Physical Therapy

## 2022-09-17 ENCOUNTER — Ambulatory Visit: Payer: 59 | Admitting: Physical Therapy

## 2022-09-17 DIAGNOSIS — M6281 Muscle weakness (generalized): Secondary | ICD-10-CM

## 2022-09-17 DIAGNOSIS — M5416 Radiculopathy, lumbar region: Secondary | ICD-10-CM

## 2022-09-17 DIAGNOSIS — M79661 Pain in right lower leg: Secondary | ICD-10-CM | POA: Diagnosis not present

## 2022-09-17 DIAGNOSIS — R293 Abnormal posture: Secondary | ICD-10-CM

## 2022-09-17 DIAGNOSIS — M5459 Other low back pain: Secondary | ICD-10-CM

## 2022-09-17 NOTE — Therapy (Signed)
OUTPATIENT PHYSICAL THERAPY TREATMENT NOTE   Patient Name: Brianna Barr MRN: [id number] DOB:1995-12-24, 26 y.o., female Today's Date: 09/17/2022   REFERRING PROVIDER: Pedro Earls, MD   END OF SESSION:   PT End of Session - 09/17/22 0929     Visit Number 3    Date for PT Re-Evaluation 10/22/22    Authorization Type [facility] - review after 25 visits    Authorization - Visit Number 3    Authorization - Number of Visits 25    PT Start Time [phone]    PT Stop Time 1011    PT Time Calculation (min) 42 min    Activity Tolerance Patient tolerated treatment well    Behavior During Therapy WFL for tasks assessed/performed              Past Medical History:  Diagnosis Date   Anxiety    Asthma    Depression    Overactive bladder    Stress incontinence    Past Surgical History:  Procedure Laterality Date   WISDOM TOOTH EXTRACTION     Patient Active Problem List   Diagnosis Date Noted   Anxiety and depression 07/07/2019   Mild intermittent asthma without complication [id number]   Seasonal allergies 07/07/2019   Plantar fasciitis 07/07/2019   Hyperhidrosis 07/07/2019   OAB (overactive bladder) 07/07/2019   Allergy 08/06/2018    REFERRING DIAG: [id number] (ICD-10-CM) - Pain in right lower leg M79.662 (ICD-10-CM) - Pain in left lower leg   THERAPY DIAG:  Other low back pain  Radiculopathy, lumbar region  Abnormal posture  Muscle weakness (generalized)  Rationale for Evaluation and Treatment Rehabilitation  PERTINENT HISTORY: 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  If I can shift weight or lean on something while standing  at work I can last longer before my leg symptoms come on.  I am more aware of my core on the job.   PAIN:  Are you having pain? Yes NPRS scale: 2-8/10, today is a 4/10 Pain location: central lumbar and spreads bil  Pain orientation: Right, Left, and Lower  PAIN TYPE: aching, dull, and sharp Pain description: constant  Aggravating factors: prolonged standing, twist/bend Relieving factors: heat, Biofreeze, OTC meds   OBJECTIVE: (objective measures completed at initial evaluation unless otherwise  dated)     DIAGNOSTIC FINDINGS:  MRI lumbar 2 weeks ago: per Pt mild degeneration - has been referred to neurologist - results not in chart   PATIENT SURVEYS:  FOTO 51% goal 64%   SCREENING FOR RED FLAGS: Bowel or bladder incontinence: No Spinal tumors: No Cauda equina syndrome: No Compression fracture: No Abdominal aneurysm: No   COGNITION: Overall cognitive status: Within functional limits for tasks assessed                          SENSATION: Dull sensation reported through anterior thigh and leg when symptoms present, otherwise normal   MUSCLE LENGTH: Hamstrings: Right 55 deg; Left 70 deg End range piriformis and gluteals limited Rt>Lt     POSTURE: rounded shoulders, increased lumbar lordosis, and anterior pelvic tilt   PALPATION: Diffuse tenderness present   LUMBAR ROM:    AROM eval  Flexion Fingers to ankles, mild LBP  Extension 15 deg, immediate onset pins/needles in bil LE  Right lateral flexion full  Left lateral flexion full  Right rotation 50%  Left rotation 50%   (Blank rows = not tested)   LOWER EXTREMITY ROM:      Passive  Right eval Left eval  Hip flexion 90 deg, pain Full, normal  Hip extension      Hip abduction      Hip adduction      Hip internal rotation Full, pain full  Hip external rotation 40, pain 50  Knee flexion      Knee extension      Ankle dorsiflexion      Ankle plantarflexion      Ankle inversion      Ankle eversion        (Blank rows = not tested)   LOWER EXTREMITY MMT:   Grossly 4+/5 bil   LUMBAR SPECIAL TESTS:  Straight leg raise test: Negative   FUNCTIONAL TESTS:  Ind with transfers and bed mobility   GAIT: Distance walked: within clinic Assistive device utilized: None Level of assistance: Complete Independence Comments: wide base of support   TODAY'S TREATMENT:                                                                                                                              DATE:  09/17/22: NuStep L6 x 6' PT present to monitor Quadruped mad cat to child's pose x 10 Supine hamstring curl feet on red physioball with pelvic tilt x 10 Supine bridge calves on red physioball knees straight x 10 Dying bug 2x20 Supine crunch rolling physioball up thighs 1x10, then 3x5 each diagonal Sit to stand from low mat table x10 hold 10lb Modified depth dead lifts x15 10lb Pallof press red 2x5 chest press and upward press each way Chair plank 4x10" Standing lat pulldown 25lb 1x15 Reverse chair sit ups arms crossed x 10 Seated lumbar hang for stretch/decompress x 30"  09/10/22: NuStep  L3 x 6' PT present to discuss symptoms Quadruped child's pose with mad cat combo x 5 cycles Child's pose with SB 2x10" each way SL clam green tband tied at knees 1x10 with TA indraw Supine pelvic tilt x 5, then articulating bridge with bil hip abd x 10, green tied band at knees Supine piriformis stretch 2x20" bil Wall plank 2x10", then wall push ups x 5 Standing red tband bil UE flexion to neutral 10x5" holds for core awareness Standing red tband bil UE extension to neutral 10x5" holds for core awareness Intro reps for reverse chair sit ups - more next time HEP updated and printed Neuro re-ed: PT with TC and VC with demo for proper postural set up and strategies for improved core awareness throughout session    08/27/22: Initiated HEP     PATIENT EDUCATION:  Education details: Access Code: [email or web address] Person  educated: Patient Education method: Programmer, multimedia, Facilities manager, Verbal cues, and Handouts Education comprehension: verbalized understanding and returned demonstration   HOME EXERCISE PROGRAM: Access Code: [email or web address] URL: [email or web address] Date: 09/10/2022 Prepared by: Loistine Simas Signora Zucco  Exercises - Supine Posterior Pelvic Tilt  - 1 x daily - 7 x weekly - 1 sets - 10 reps - 2 breaths hold - Supine Figure 4 Piriformis Stretch  - 1 x daily - 7 x weekly - 1 sets - 2 reps - 20 hold - Supine Piriformis Stretch with Foot on Ground  - 7 x weekly - 1 sets - 2 reps - 20 hold - Seated Hamstring Stretch  - 1 x daily - 7 x weekly - 1 sets - 2 reps - 20 hold - Standing Plank on Wall  - 1 x daily - 7 x weekly - 1 sets - 5 reps - 10 hold - Child's Pose Stretch  - 1 x daily - 7 x weekly - 1 sets - 5 reps - 5 hold - Child's Pose with Sidebending  - 1 x daily - 7 x weekly - 1 sets - 5 reps - 5 hold - Clamshell with Resistance  - 1 x daily - 7 x weekly - 2 sets - 10 reps - Bridge with Hip Abduction and Resistance  - 1 x daily - 7 x weekly - 2 sets - 10 reps - Standing Shoulder Extension with Resistance  - 1 x daily - 7 x weekly - 1 sets - 10 reps - 5 hold   ASSESSMENT:   CLINICAL IMPRESSION: Patient reports more core use at work but does continue to get bil LE symptoms with prolonged standing at work, especially when static standing.  She has been doing long multi-day shifts so hasn't been consistent with HEP.  Pt needs cueing for postural alignment with standing therex.  She demos improving awareness of deep core recruitment with overlay of therex.  Progressed standing therex today with good tolerance.     OBJECTIVE IMPAIRMENTS: decreased activity tolerance, decreased mobility, decreased ROM, decreased strength, impaired flexibility, impaired sensation, improper body mechanics, postural dysfunction, and pain.    ACTIVITY LIMITATIONS: carrying, lifting, bending, standing, squatting, and  locomotion level   PARTICIPATION LIMITATIONS: meal prep, cleaning, laundry, shopping, community activity, and occupation   PERSONAL FACTORS: Time since onset of injury/illness/exacerbation are also affecting patient's functional outcome.    REHAB POTENTIAL: Good   CLINICAL DECISION MAKING: Stable/uncomplicated   EVALUATION COMPLEXITY: Low     GOALS: Goals reviewed with patient? Yes   SHORT TERM GOALS: Target date: 09/24/2022   Pt will be ind with initial HEP  without exacerbation of symptoms.  Baseline: Goal status: ongoing   2.  Pt will report 20% less pain with work shift Baseline:  Goal status: INITIAL   3.  Pt will demo proper activation of core in static and dynamic postures  Baseline:  Goal status: INITIAL     LONG TERM GOALS: Target date: 10/22/2022   Pt will be ind with advanced HEP and understand how to safely progress. Baseline:  Goal status: INITIAL   2.  Improve FOTO score to at least 64% to demo improved function. Baseline: 51% Goal status: INITIAL   3.  Pt will normalize LE flexibility of bil hamstrings to improve bending for work and housework tasks. Baseline:  Goal status: INITIAL   4.  Pt will demo strong core stabilization with functional body mechanics for bending, lifting and carrying to protect her back from further injury. Baseline:  Goal status: INITIAL   5.  Pt will report at least 50% reduction in pain with standing tasks at work and home. Baseline:  Goal status: INITIAL     PLAN:   PT FREQUENCY: 1-2x/week   PT DURATION: 8 weeks   PLANNED INTERVENTIONS: Therapeutic exercises, Therapeutic activity, Neuromuscular re-education, Balance training, Gait training, Patient/Family education, Self Care, Joint mobilization, Aquatic Therapy, Dry Needling, Electrical stimulation, Spinal mobilization, Cryotherapy, Moist heat, Taping, Ionotophoresis 4mg /ml Dexamethasone, and Manual therapy.   PLAN FOR NEXT SESSION: NuStep, review HEP, progress  with LE stretching and lumbar stabilization as tol, Pt may do best with mat and chair therex to start   Baruch Merl, PT 09/17/22 10:13 AM

## 2022-09-23 ENCOUNTER — Ambulatory Visit: Payer: 59 | Admitting: Psychiatry

## 2022-09-23 DIAGNOSIS — F411 Generalized anxiety disorder: Secondary | ICD-10-CM
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
## 2022-09-24 ENCOUNTER — Ambulatory Visit: Payer: 59 | Admitting: Physical Therapy

## 2022-09-24 ENCOUNTER — Encounter: Payer: Self-pay | Admitting: Physical Therapy

## 2022-09-24 DIAGNOSIS — R293 Abnormal posture: Secondary | ICD-10-CM

## 2022-09-24 DIAGNOSIS — M5416 Radiculopathy, lumbar region: Secondary | ICD-10-CM

## 2022-09-24 DIAGNOSIS — M6281 Muscle weakness (generalized): Secondary | ICD-10-CM

## 2022-09-24 DIAGNOSIS — M5459 Other low back pain: Secondary | ICD-10-CM

## 2022-09-24 DIAGNOSIS — M79661 Pain in right lower leg: Secondary | ICD-10-CM | POA: Diagnosis not present

## 2022-09-24 NOTE — Therapy (Signed)
OUTPATIENT PHYSICAL THERAPY TREATMENT NOTE   Patient Name: Brianna Barr MRN: [id number] DOB:[birth year], 26 y.o., female Today's Date: 09/24/2022   REFERRING PROVIDER: Pedro Earls, MD   END OF SESSION:   PT End of Session - 09/24/22 0849     Visit Number 4    Date for PT Re-Evaluation 10/22/22    Authorization Type [facility] - review after 25 visits    Authorization - Visit Number 4    Authorization - Number of Visits 25    PT Start Time 0845    PT Stop Time 0926    PT Time Calculation (min) 41 min    Activity Tolerance Patient tolerated treatment well    Behavior During Therapy [facility] for tasks assessed/performed               Past Medical History:  Diagnosis Date   Anxiety    Asthma    Depression    Overactive bladder    Stress incontinence    Past Surgical History:  Procedure Laterality Date   WISDOM TOOTH EXTRACTION     Patient Active Problem List   Diagnosis Date Noted   Anxiety and depression 07/07/2019   Mild intermittent asthma without complication 82/70/7867   Seasonal allergies 07/07/2019   Plantar fasciitis 07/07/2019   Hyperhidrosis 07/07/2019   OAB (overactive bladder) 07/07/2019   Allergy 08/06/2018    REFERRING DIAG: J44.920 (ICD-10-CM) - Pain in right lower leg M79.662 (ICD-10-CM) - Pain in left lower leg   THERAPY DIAG:  Other low back pain  Radiculopathy, lumbar region  Abnormal posture  Muscle weakness (generalized)  Rationale for Evaluation and Treatment Rehabilitation  PERTINENT HISTORY: 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  I am more flexible and more aware  of my core.  I have been off work for 5 days and pain only reached up to 5/10.  I am heading into 3 12 hour shifts.     PAIN:  Are you having pain? Yes NPRS scale: 2-5/10, today is a 2/10 Pain location: central lumbar and spreads bil  Pain orientation: Right, Left, and Lower  PAIN TYPE: aching, dull, and sharp Pain description: constant  Aggravating factors: prolonged standing, twist/bend Relieving factors: heat, Biofreeze, OTC meds   OBJECTIVE: (objective  measures completed at initial evaluation unless otherwise dated)     DIAGNOSTIC FINDINGS:  MRI lumbar 2 weeks ago: per Pt mild degeneration - has been referred to neurologist - results not in chart   PATIENT SURVEYS:  FOTO 51% goal 64%   SCREENING FOR RED FLAGS: Bowel or bladder incontinence: No Spinal tumors: No Cauda equina syndrome: No Compression fracture: No Abdominal aneurysm: No   COGNITION: Overall cognitive status: Within functional limits for tasks assessed                          SENSATION: Dull sensation reported through anterior thigh and leg when symptoms present, otherwise normal   MUSCLE LENGTH: Hamstrings: Right 55 deg; Left 70 deg End range piriformis and gluteals limited Rt>Lt     POSTURE: rounded shoulders, increased lumbar lordosis, and anterior pelvic tilt   PALPATION: Diffuse tenderness present   LUMBAR ROM:    AROM eval  Flexion Fingers to ankles, mild LBP  Extension 15 deg, immediate onset pins/needles in bil LE  Right lateral flexion full  Left lateral flexion full  Right rotation 50%  Left rotation 50%   (Blank rows = not tested)   LOWER EXTREMITY ROM:      Passive  Right eval Left eval  Hip flexion 90 deg, pain Full, normal  Hip extension      Hip abduction      Hip adduction      Hip internal rotation Full, pain full  Hip external rotation 40, pain 50  Knee flexion      Knee extension      Ankle dorsiflexion      Ankle plantarflexion      Ankle inversion       Ankle eversion       (Blank rows = not tested)   LOWER EXTREMITY MMT:   Grossly 4+/5 bil   LUMBAR SPECIAL TESTS:  Straight leg raise test: Negative   FUNCTIONAL TESTS:  Ind with transfers and bed mobility   GAIT: Distance walked: within clinic Assistive device utilized: None Level of assistance: Complete Independence Comments: wide base of support   TODAY'S TREATMENT:                                                                                                                              DATE:  09/24/22: NuStep L5 x 6' PT present to discuss progress Quadruped mad cat to child's pose 5x5" holds Bird dog x 5 rounds opp arm/leg Supine bridge with hip abd band red loop x 10 Supine crunch rolling physioball up thighs 1x10, then 1x10 each diagonal Sidestepping red loop at ankles 2 laps 5 steps each way Sit to stand from low mat table hold 10lb kbell red loop at knees x 15 Seated deadlift 10lb x20 Standing lat pulldown 25lb x 20 5lb pulley pallof press with trunk rotation x 5 each way Seated lumbar hang for stretch/decompress  hugging 10lb weight plate 2x 30" Chair plank on elbows 3x20" Repeat seated lumbar hang 10lb weight plate x 30" after planks  09/17/22: NuStep L6 x 6' PT present to monitor Quadruped mad cat to child's pose x 10 Supine hamstring curl feet on red physioball with pelvic tilt x 10 Supine bridge calves on red physioball knees straight x 10 Dying bug 2x20 Supine crunch rolling physioball up thighs 1x10, then 3x5 each diagonal Sit to stand from low mat table x10 hold 10lb Modified depth dead lifts x15 10lb Pallof press red 2x5 chest press and upward press each way Chair plank 4x10" Standing lat pulldown 25lb 1x15 Reverse chair sit ups arms crossed x 10 Seated lumbar hang for stretch/decompress x 30"  09/10/22: NuStep L3 x 6' PT present to discuss symptoms Quadruped child's pose with mad cat combo x 5 cycles Child's pose with SB 2x10" each way SL clam  green tband tied at knees 1x10 with TA indraw Supine pelvic tilt x 5, then articulating bridge with bil hip abd x 10, green tied band at knees Supine piriformis stretch 2x20" bil Wall plank 2x10", then wall push ups x 5 Standing red tband bil UE flexion to neutral 10x5" holds for core awareness Standing red tband bil UE extension to neutral 10x5" holds for core awareness Intro reps for reverse chair sit ups - more next time HEP updated and printed Neuro re-ed: PT with TC and VC with demo for proper postural set up and strategies for improved core awareness throughout session     PATIENT EDUCATION:  Education details: Access Code: [email or web address] Person educated: Patient Education method: Consulting civil engineer, Media planner, Verbal cues, and Handouts Education comprehension: verbalized understanding and returned demonstration   HOME EXERCISE PROGRAM: Access Code: [email or web address] URL: [email or web address] Date: 09/10/2022 Prepared by: Venetia Night Thedora Rings  Exercises - Supine Posterior Pelvic Tilt  - 1 x daily - 7 x weekly - 1 sets - 10 reps - 2 breaths hold - Supine Figure 4 Piriformis Stretch  - 1 x daily - 7 x weekly - 1 sets - 2 reps - 20 hold - Supine Piriformis Stretch with Foot on Ground  - 7 x weekly - 1 sets - 2 reps - 20 hold - Seated Hamstring Stretch  - 1 x daily - 7 x weekly - 1 sets - 2 reps - 20 hold - Standing Plank on Wall  - 1 x daily - 7 x weekly - 1 sets - 5 reps - 10 hold - Child's Pose Stretch  - 1 x daily - 7 x weekly - 1 sets - 5 reps - 5 hold - Child's Pose with Sidebending  - 1 x daily - 7 x weekly - 1 sets - 5 reps - 5 hold - Clamshell with Resistance  - 1 x daily - 7 x weekly - 2 sets - 10 reps - Bridge with Hip Abduction and Resistance  - 1 x daily - 7 x weekly - 2 sets - 10 reps - Standing Shoulder Extension with Resistance  - 1 x daily - 7 x weekly - 1 sets - 10 reps - 5 hold   ASSESSMENT:   CLINICAL IMPRESSION: Patient has been off work x 5 days and pain has  only reached 5/10 at most.  Arrived with 2/10 pain today.  She reports less intensity of LE symptoms and longer duration of time on feet before bil LE symptom onset at work last week.  She demos much improved recruitment of deep core with dynamic  exercise.  She was able to progress chair plank to full plank on elbows without pike today with good control.  Flexibility improvements are allowing for more ease with functional bending and squatting at work.  Continue along POC.    OBJECTIVE IMPAIRMENTS: decreased activity tolerance, decreased mobility, decreased ROM, decreased strength, impaired flexibility, impaired sensation, improper body mechanics, postural dysfunction, and pain.    ACTIVITY LIMITATIONS: carrying, lifting, bending, standing, squatting, and locomotion level   PARTICIPATION LIMITATIONS: meal prep, cleaning, laundry, shopping, community activity, and occupation   PERSONAL FACTORS: Time since onset of injury/illness/exacerbation are also affecting patient's functional outcome.    REHAB POTENTIAL: Good   CLINICAL DECISION MAKING: Stable/uncomplicated   EVALUATION COMPLEXITY: Low     GOALS: Goals reviewed with patient? Yes   SHORT TERM GOALS: Target date: 09/24/2022   Pt will be ind with initial HEP without exacerbation of symptoms.  Baseline: Goal status: met   2.  Pt will report 20% less pain with work shift Baseline:  Goal status: met   3.  Pt will demo proper activation of core in static and dynamic postures  Baseline:  Goal status: met     LONG TERM GOALS: Target date: 10/22/2022   Pt will be ind with advanced HEP and understand how to safely progress. Baseline:  Goal status: INITIAL   2.  Improve FOTO score to at least 64% to demo improved function. Baseline: 51% Goal status: INITIAL   3.  Pt will normalize LE flexibility of bil hamstrings to improve bending for work and housework tasks. Baseline:  Goal status: INITIAL   4.  Pt will demo strong core  stabilization with functional body mechanics for bending, lifting and carrying to protect her back from further injury. Baseline:  Goal status: ongoing   5.  Pt will report at least 50% reduction in pain with standing tasks at work and home. Baseline:  Goal status: ongoing     PLAN:   PT FREQUENCY: 1-2x/week   PT DURATION: 8 weeks   PLANNED INTERVENTIONS: Therapeutic exercises, Therapeutic activity, Neuromuscular re-education, Balance training, Gait training, Patient/Family education, Self Care, Joint mobilization, Aquatic Therapy, Dry Needling, Electrical stimulation, Spinal mobilization, Cryotherapy, Moist heat, Taping, Ionotophoresis 44m/ml Dexamethasone, and Manual therapy.   PLAN FOR NEXT SESSION: do FOTO, NuStep, review HEP, progress with LE stretching and lumbar stabilization as tol, Pt may do best with mat and chair therex to start   JBaruch Merl PT 09/24/22 9:26 AM

## 2022-10-01 ENCOUNTER — Encounter: Payer: Self-pay | Admitting: Physical Therapy

## 2022-10-01 ENCOUNTER — Ambulatory Visit: Payer: 59 | Attending: Sports Medicine | Admitting: Physical Therapy

## 2022-10-01 DIAGNOSIS — M5459 Other low back pain: Secondary | ICD-10-CM | POA: Insufficient documentation

## 2022-10-01 DIAGNOSIS — M5416 Radiculopathy, lumbar region: Secondary | ICD-10-CM | POA: Diagnosis present

## 2022-10-01 DIAGNOSIS — R293 Abnormal posture: Secondary | ICD-10-CM | POA: Insufficient documentation

## 2022-10-01 DIAGNOSIS — M6281 Muscle weakness (generalized): Secondary | ICD-10-CM | POA: Insufficient documentation
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
## 2022-10-02 ENCOUNTER — Ambulatory Visit: Payer: 59 | Admitting: Neurology

## 2022-10-07 ENCOUNTER — Ambulatory Visit (INDEPENDENT_AMBULATORY_CARE_PROVIDER_SITE_OTHER): Payer: 59 | Admitting: Psychiatry

## 2022-10-07 DIAGNOSIS — F411 Generalized anxiety disorder: Secondary | ICD-10-CM
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
## 2022-10-08 ENCOUNTER — Ambulatory Visit: Payer: 59 | Admitting: Physical Therapy

## 2022-10-08 NOTE — Therapy (Incomplete)
OUTPATIENT PHYSICAL THERAPY TREATMENT NOTE   Patient Name: Brianna Barr MRN: [id number] DOB:Sep 07, 1996, 26 y.o., female Today's Date: 10/08/2022   REFERRING PROVIDER: Pedro Earls, MD   END OF SESSION:        Past Medical History:  Diagnosis Date   Anxiety    Asthma    Depression    Overactive bladder    Stress incontinence    Past Surgical History:  Procedure Laterality Date   WISDOM TOOTH EXTRACTION     Patient Active Problem List   Diagnosis Date Noted   Anxiety and depression 07/07/2019   Mild intermittent asthma without complication 89/21/1941   Seasonal allergies 07/07/2019   Plantar fasciitis 07/07/2019   Hyperhidrosis 07/07/2019   OAB (overactive bladder) 07/07/2019   Allergy 08/06/2018    REFERRING DIAG: D40.814 (ICD-10-CM) - Pain in right lower leg M79.662 (ICD-10-CM) - Pain in left lower leg   THERAPY DIAG:  No diagnosis found.  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 Yes NPRS scale: 1-7/10, today 1-2/10 Pain location: central lumbar and spreads bil  Pain orientation: Right, Left, and Lower  PAIN TYPE: aching, dull, and sharp Pain description: constant  Aggravating factors: end of work shift (12 hours) Relieving factors: heat, Biofreeze, OTC meds   OBJECTIVE: (objective measures completed at initial evaluation unless otherwise dated)     DIAGNOSTIC FINDINGS:  MRI lumbar 2 weeks ago: per Pt mild degeneration - has been referred to neurologist - results not in chart   PATIENT SURVEYS:  FOTO 51% goal 64%   SCREENING FOR RED FLAGS: Bowel or bladder  incontinence: No Spinal tumors: No Cauda equina syndrome: No Compression fracture: No Abdominal aneurysm: No   COGNITION: Overall cognitive status: Within functional limits for tasks assessed                          SENSATION: Dull sensation reported through anterior thigh and leg when symptoms present, otherwise normal   MUSCLE LENGTH: 12/13: Hamstrings:  Piriformis:  Eval: Hamstrings: Right 55 deg; Left 70 deg End range piriformis and gluteals limited Rt>Lt     POSTURE: rounded shoulders, increased lumbar lordosis, and anterior pelvic tilt   PALPATION: Diffuse tenderness present   LUMBAR  ROM:    AROM eval 12/13  Flexion Fingers to ankles, mild LBP   Extension 15 deg, immediate onset pins/needles in bil LE   Right lateral flexion full   Left lateral flexion full   Right rotation 50%   Left rotation 50%    (Blank rows = not tested)   LOWER EXTREMITY ROM:      Passive  Right eval Left eval Right 12/13 Left 12/13  Hip flexion 90 deg, pain Full, normal    Hip extension        Hip abduction        Hip adduction        Hip internal rotation Full, pain full    Hip external rotation 40, pain 50    Knee flexion        Knee extension        Ankle dorsiflexion        Ankle plantarflexion        Ankle inversion        Ankle eversion         (Blank rows = not tested)   LOWER EXTREMITY MMT:   Grossly 4+/5 bil   LUMBAR SPECIAL TESTS:  Straight leg raise test: Negative   FUNCTIONAL TESTS:  Ind with transfers and bed mobility   GAIT: Distance walked: within clinic Assistive device utilized: None Level of assistance: Complete Independence Comments: wide base of support   TODAY'S TREATMENT:                                                                                                                              DATE:  10/08/22:   10/01/22: NuStep L5 x 6' PT present to discuss progress Pt education: option to use portable TENS at work Quadruped mad cat  to child's pose 5x5" holds Supine bridge with hip abd band red loop x 10 Supine crunch rolling physioball up thighs 1x10, then 1x10 each diagonal Squat to chair with 5lb kbell x 10, needed cueing for hip hinge with knee flexion to keep COG in feet Seated deadlift 5lb x10, VC for target muscle focus for deep core Stagger stance diagonal row x5 each side Trigger Point Dry-Needling  Treatment instructions: Expect mild to moderate muscle soreness. S/S of pneumothorax if dry needled over a lung field, and to seek immediate medical attention should they occur. Patient verbalized understanding of these instructions and education.  Patient Consent Given: Yes Education handout provided: Previously provided Muscles treated: bil lumbar multifidi L4-S1, right glut med and piriformis Electrical stimulation performed: No Parameters: N/A Treatment response/outcome: twitch and elongation of tissues STM Rt gluteals after DN, reiterated aftercare for soreness   09/24/22: NuStep L5 x 6' PT present to discuss progress Quadruped mad cat to child's pose 5x5" holds Bird dog x 5 rounds opp arm/leg Supine bridge with hip abd band red loop x 10 Supine crunch rolling physioball up thighs 1x10, then 1x10 each diagonal Sidestepping red loop  at ankles 2 laps 5 steps each way Sit to stand from low mat table hold 10lb kbell red loop at knees x 15 Seated deadlift 10lb x20 Standing lat pulldown 25lb x 20 5lb pulley pallof press with trunk rotation x 5 each way Seated lumbar hang for stretch/decompress hugging 10lb weight plate 2x 30" Chair plank on elbows 3x20" Repeat seated lumbar hang 10lb weight plate x 30" after planks   PATIENT EDUCATION:  Education details: Access Code: [email or web address] Person educated: Patient Education method: Explanation, Demonstration, Verbal cues, and Handouts Education comprehension: verbalized understanding and returned demonstration   HOME EXERCISE PROGRAM: Access Code: [email or web address] URL:  [email or web address] Date: 09/10/2022 Prepared by: Johanna Beuhring  Exercises - Supine Posterior Pelvic Tilt  - 1 x daily - 7 x weekly - 1 sets - 10 reps - 2 breaths hold - Supine Figure 4 Piriformis Stretch  - 1 x daily - 7 x weekly - 1 sets - 2 reps - 20 hold - Supine Piriformis Stretch with Foot on Ground  - 7 x weekly - 1 sets - 2 reps - 20 hold - Seated Hamstring Stretch  - 1 x daily - 7 x weekly - 1 sets - 2 reps - 20 hold - Standing Plank on Wall  - 1 x daily - 7 x weekly - 1 sets - 5 reps - 10 hold - Child's Pose Stretch  - 1 x daily - 7 x weekly - 1 sets - 5 reps - 5 hold - Child's Pose with Sidebending  - 1 x daily - 7 x weekly - 1 sets - 5 reps - 5 hold - Clamshell with Resistance  - 1 x daily - 7 x weekly - 2 sets - 10 reps - Bridge with Hip Abduction and Resistance  - 1 x daily - 7 x weekly - 2 sets - 10 reps - Standing Shoulder Extension with Resistance  - 1 x daily - 7 x weekly - 1 sets - 10 reps - 5 hold   ASSESSMENT:   CLINICAL IMPRESSION: Patient is able to work 4-5 hours before onset of pain during a 12 hour shift.  She is demo'ing improved body mechanics and core control with bending and lifting therex.  PT added diagonal sling strength with stagger stance diagonal row today with good tolerance.  Added TPDN to lumbar spine and Rt hip today due to ongoing high levels of pain reaching 7/10 on the job to see if this helps.  Pt also encouraged to see if she can find her home TENS unit to wear at work.   OBJECTIVE IMPAIRMENTS: decreased activity tolerance, decreased mobility, decreased ROM, decreased strength, impaired flexibility, impaired sensation, improper body mechanics, postural dysfunction, and pain.    ACTIVITY LIMITATIONS: carrying, lifting, bending, standing, squatting, and locomotion level   PARTICIPATION LIMITATIONS: meal prep, cleaning, laundry, shopping, community activity, and occupation   PERSONAL FACTORS: Time since onset of  injury/illness/exacerbation are also affecting patient's functional outcome.    REHAB POTENTIAL: Good   CLINICAL DECISION MAKING: Stable/uncomplicated   EVALUATION COMPLEXITY: Low     GOALS: Goals reviewed with patient? Yes   SHORT TERM GOALS: Target date: 09/24/2022   Pt will be ind with initial HEP without exacerbation of symptoms.  Baseline: Goal status: met   2.  Pt will report 20% less pain with work shift Baseline:  Goal status: met   3.  Pt will demo proper activation of core in static and dynamic postures  Baseline:  Goal status:   met     LONG TERM GOALS: Target date: 10/22/2022   Pt will be ind with advanced HEP and understand how to safely progress. Baseline:  Goal status: INITIAL   2.  Improve FOTO score to at least 64% to demo improved function. Baseline: 51% Goal status: INITIAL   3.  Pt will normalize LE flexibility of bil hamstrings to improve bending for work and housework tasks. Baseline:  Goal status: INITIAL   4.  Pt will demo strong core stabilization with functional body mechanics for bending, lifting and carrying to protect her back from further injury. Baseline:  Goal status: ongoing   5.  Pt will report at least 50% reduction in pain with standing tasks at work and home. Baseline:  Goal status: ongoing     PLAN:   PT FREQUENCY: 1-2x/week   PT DURATION: 8 weeks   PLANNED INTERVENTIONS: Therapeutic exercises, Therapeutic activity, Neuromuscular re-education, Balance training, Gait training, Patient/Family education, Self Care, Joint mobilization, Aquatic Therapy, Dry Needling, Electrical stimulation, Spinal mobilization, Cryotherapy, Moist heat, Taping, Ionotophoresis 63m/ml Dexamethasone, and Manual therapy.   PLAN FOR NEXT SESSION: ERO next time, did Pt find TENS unit, did DN help?, do FOTO, NuStep, review HEP, progress with LE stretching and lumbar stabilization as tol, Pt may do best with mat and chair therex to start   JBaruch Merl PT 10/08/22 7:43 AM

## 2022-10-22 ENCOUNTER — Ambulatory Visit: Payer: 59 | Admitting: Rehabilitative and Restorative Service Providers"

## 2022-10-22 ENCOUNTER — Encounter: Payer: Self-pay | Admitting: Rehabilitative and Restorative Service Providers"

## 2022-10-22 DIAGNOSIS — M5459 Other low back pain: Secondary | ICD-10-CM

## 2022-10-22 DIAGNOSIS — R293 Abnormal posture: Secondary | ICD-10-CM

## 2022-10-22 DIAGNOSIS — M5416 Radiculopathy, lumbar region: Secondary | ICD-10-CM

## 2022-10-22 DIAGNOSIS — M6281 Muscle weakness (generalized): Secondary | ICD-10-CM

## 2022-10-22 NOTE — Therapy (Signed)
OUTPATIENT PHYSICAL THERAPY TREATMENT NOTE AND DISCHARGE SUMMARY   Patient Name: Brianna Barr MRN: [id number] DOB:[birth year], 26 y.o., female Today's Date: 10/22/2022   REFERRING PROVIDER: Pedro Earls, MD   END OF SESSION:   PT End of Session - 10/22/22 0936     Visit Number 6    Date for PT Re-Evaluation 10/22/22    Authorization Type [facility] - review after 25 visits    Authorization - Visit Number 6    Authorization - Number of Visits 25    PT Start Time [phone]    PT Stop Time 1010    PT Time Calculation (min) 39 min    Activity Tolerance Patient tolerated treatment well    Behavior During Therapy WFL for tasks assessed/performed                Past Medical History:  Diagnosis Date   Anxiety    Asthma    Depression    Overactive bladder    Stress incontinence    Past Surgical History:  Procedure Laterality Date   WISDOM TOOTH EXTRACTION     Patient Active Problem List   Diagnosis Date Noted   Anxiety and depression 07/07/2019   Mild intermittent asthma without complication 31/54/0086   Seasonal allergies 07/07/2019   Plantar fasciitis 07/07/2019   Hyperhidrosis 07/07/2019   OAB (overactive bladder) 07/07/2019   Allergy 08/06/2018    REFERRING DIAG: P61.950 (ICD-10-CM) - Pain in right lower leg M79.662 (ICD-10-CM) - Pain in left lower leg   THERAPY DIAG:  Other low back pain  Radiculopathy, lumbar region  Abnormal posture  Muscle weakness (generalized)  Rationale for Evaluation and Treatment Rehabilitation  PERTINENT HISTORY: 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  States that  her dad is going to get her a membership to a gym.   PAIN:  Are you having pain? Yes NPRS scale: 3/10 Pain location: central lumbar and spreads bil  Pain orientation: Right, Left, and Lower  PAIN TYPE: aching, dull, and sharp Pain description: constant  Aggravating factors: end of work shift (12 hours) Relieving factors: heat, Biofreeze, OTC meds   OBJECTIVE: (objective measures completed at initial evaluation unless otherwise dated)     DIAGNOSTIC FINDINGS:  MRI lumbar 2 weeks  ago: per Pt mild degeneration - has been referred to neurologist - results not in chart   PATIENT SURVEYS:  Eval:  FOTO 51% goal 64% 10/22/2022:  67%     MUSCLE LENGTH: Eval: Hamstrings: Right 55 deg; Left 70 deg End range piriformis and gluteals limited Rt>Lt  10/22/2022: Hamstrings:  Right 65 deg, Left 70 deg     POSTURE: rounded shoulders, increased lumbar lordosis, and anterior pelvic tilt   PALPATION: Diffuse tenderness present   LUMBAR ROM:    AROM eval  Flexion Fingers to ankles, mild LBP  Extension 15 deg, immediate onset pins/needles in bil LE  Right lateral flexion full  Left lateral flexion full  Right rotation 50%  Left rotation 50%   (Blank rows = not tested)   LOWER EXTREMITY ROM:      Passive  Right eval Left eval  Hip flexion 90 deg, pain Full, normal  Hip extension      Hip abduction      Hip adduction      Hip internal rotation Full, pain full  Hip external rotation 40, pain 50  Knee flexion      Knee extension      Ankle dorsiflexion      Ankle plantarflexion      Ankle inversion      Ankle eversion       (Blank rows = not tested)   LOWER EXTREMITY MMT:   Grossly 4+/5 bil   LUMBAR SPECIAL TESTS:  Straight leg raise test: Negative   FUNCTIONAL TESTS:  Ind with transfers and bed mobility   GAIT: Distance walked: within clinic Assistive device utilized: None Level of assistance: Complete Independence Comments: wide base of support   TODAY'S  TREATMENT:                                                                                                                               DATE: 10/22/2022 NuStep L5 x 6' PT present to discuss progress FOTO 67% Squat to chair with 10 lb kbell x 10 Seated deadlift 10 lb kettlebell x10, VC for target muscle focus for deep core Supine hamstring curl feet on red physioball with pelvic tilt x 10 Supine bridging with legs on red pball x10 Lower trunk rotation with legs on red pball x10 bilat Supine dying bug with red pball x10 Quadruped cat/cow 5x5 sec hold Quadruped alt UE/LE extension (bird dog) x10 bilat Quadruped fire hydrant x10 bilat Standing lat pull 30# 2x10 Rows 25# x10   DATE:  10/01/22: NuStep L5 x 6' PT present to discuss progress Pt education: option to use portable TENS at work Quadruped mad cat to child's pose 5x5" holds Supine bridge with hip abd band red loop x 10 Supine crunch rolling physioball up thighs 1x10, then 1x10 each diagonal Squat to chair with 5lb kbell x 10, needed cueing for hip hinge with knee flexion to keep COG in feet Seated deadlift 5lb x10,  VC for target muscle focus for deep core Stagger stance diagonal row x5 each side Trigger Point Dry-Needling  Treatment instructions: Expect mild to moderate muscle soreness. S/S of pneumothorax if dry needled over a lung field, and to seek immediate medical attention should they occur. Patient verbalized understanding of these instructions and education.  Patient Consent Given: Yes Education handout provided: Previously provided Muscles treated: bil lumbar multifidi L4-S1, right glut med and piriformis Electrical stimulation performed: No Parameters: N/A Treatment response/outcome: twitch and elongation of tissues STM Rt gluteals after DN, reiterated aftercare for soreness   09/24/22: NuStep L5 x 6' PT present to discuss progress Quadruped mad cat to child's pose 5x5" holds Bird dog x 5 rounds opp  arm/leg Supine bridge with hip abd band red loop x 10 Supine crunch rolling physioball up thighs 1x10, then 1x10 each diagonal Sidestepping red loop at ankles 2 laps 5 steps each way Sit to stand from low mat table hold 10lb kbell red loop at knees x 15 Seated deadlift 10lb x20 Standing lat pulldown 25lb x 20 5lb pulley pallof press with trunk rotation x 5 each way Seated lumbar hang for stretch/decompress hugging 10lb weight plate 2x 30" Chair plank on elbows 3x20" Repeat seated lumbar hang 10lb weight plate x 30" after planks      PATIENT EDUCATION:  Education details: Access Code: [email or web address] Person educated: Patient Education method: Consulting civil engineer, Media planner, Verbal cues, and Handouts Education comprehension: verbalized understanding and returned demonstration   HOME EXERCISE PROGRAM: Access Code: [email or web address] URL: [email or web address] Date: 09/10/2022 Prepared by: Venetia Night Beuhring  Exercises - Supine Posterior Pelvic Tilt  - 1 x daily - 7 x weekly - 1 sets - 10 reps - 2 breaths hold - Supine Figure 4 Piriformis Stretch  - 1 x daily - 7 x weekly - 1 sets - 2 reps - 20 hold - Supine Piriformis Stretch with Foot on Ground  - 7 x weekly - 1 sets - 2 reps - 20 hold - Seated Hamstring Stretch  - 1 x daily - 7 x weekly - 1 sets - 2 reps - 20 hold - Standing Plank on Wall  - 1 x daily - 7 x weekly - 1 sets - 5 reps - 10 hold - Child's Pose Stretch  - 1 x daily - 7 x weekly - 1 sets - 5 reps - 5 hold - Child's Pose with Sidebending  - 1 x daily - 7 x weekly - 1 sets - 5 reps - 5 hold - Clamshell with Resistance  - 1 x daily - 7 x weekly - 2 sets - 10 reps - Bridge with Hip Abduction and Resistance  - 1 x daily - 7 x weekly - 2 sets - 10 reps - Standing Shoulder Extension with Resistance  - 1 x daily - 7 x weekly - 1 sets - 10 reps - 5 hold   ASSESSMENT:   CLINICAL IMPRESSION: Brianna Barr has progressed with decreased pain and ability to work a full shift of work with pain no  greater than 4/10.  Pt states that she has had at least a 50% overall reduction in pain.  Patient with increased right hamstring length and demonstrates improved core strength during session.  Patient encouraged to tour the gym that she wants to join to ensure that it has the equipment and classes that she would like to utilize.  She verbalizes understanding. Brianna Barr has met FOTO goal as well as all other goals and is discharged from skilled  PT at this time to continue with HEP.   OBJECTIVE IMPAIRMENTS: decreased activity tolerance, decreased mobility, decreased ROM, decreased strength, impaired flexibility, impaired sensation, improper body mechanics, postural dysfunction, and pain.    ACTIVITY LIMITATIONS: carrying, lifting, bending, standing, squatting, and locomotion level   PARTICIPATION LIMITATIONS: meal prep, cleaning, laundry, shopping, community activity, and occupation   PERSONAL FACTORS: Time since onset of injury/illness/exacerbation are also affecting patient's functional outcome.    REHAB POTENTIAL: Good   CLINICAL DECISION MAKING: Stable/uncomplicated   EVALUATION COMPLEXITY: Low     GOALS: Goals reviewed with patient? Yes   SHORT TERM GOALS: Target date: 09/24/2022   Pt will be ind with initial HEP without exacerbation of symptoms.  Baseline: Goal status: met   2.  Pt will report 20% less pain with work shift Baseline:  Goal status: met   3.  Pt will demo proper activation of core in static and dynamic postures  Baseline:  Goal status: met     LONG TERM GOALS: Target date: 10/22/2022   Pt will be ind with advanced HEP and understand how to safely progress. Baseline:  Goal status: MET   2.  Improve FOTO score to at least 64% to demo improved function. Baseline: 51% Goal status: MET   3.  Pt will normalize LE flexibility of bil hamstrings to improve bending for work and housework tasks. Baseline:  Goal status: MET   4.  Pt will demo strong core  stabilization with functional body mechanics for bending, lifting and carrying to protect her back from further injury. Baseline:  Goal status: MET   5.  Pt will report at least 50% reduction in pain with standing tasks at work and home. Baseline:  Goal status: MET     PLAN:   PT FREQUENCY: 1-2x/week   PT DURATION: 8 weeks   PLANNED INTERVENTIONS: Therapeutic exercises, Therapeutic activity, Neuromuscular re-education, Balance training, Gait training, Patient/Family education, Self Care, Joint mobilization, Aquatic Therapy, Dry Needling, Electrical stimulation, Spinal mobilization, Cryotherapy, Moist heat, Taping, Ionotophoresis 48m/ml Dexamethasone, and Manual therapy.   PLAN FOR NEXT SESSION: Discharge completed on 10/22/22   PHYSICAL THERAPY DISCHARGE SUMMARY  Patient agrees to discharge. Patient goals were met. Patient is being discharged due to meeting the stated rehab goals.     SJuel Burrow PT 10/22/22 10:16 AM   [facility] [address] [place] [address]Phone # [phone] [phone]

## 2022-10-23 ENCOUNTER — Ambulatory Visit (INDEPENDENT_AMBULATORY_CARE_PROVIDER_SITE_OTHER): Payer: 59 | Admitting: Psychiatry

## 2022-10-23 DIAGNOSIS — F411 Generalized anxiety disorder: Secondary | ICD-10-CM
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
## 2022-11-06 ENCOUNTER — Ambulatory Visit (INDEPENDENT_AMBULATORY_CARE_PROVIDER_SITE_OTHER): Payer: 59 | Admitting: Psychiatry

## 2022-11-06 DIAGNOSIS — F411 Generalized anxiety disorder: Secondary | ICD-10-CM | POA: Diagnosis not present
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
## 2022-11-20 ENCOUNTER — Ambulatory Visit (INDEPENDENT_AMBULATORY_CARE_PROVIDER_SITE_OTHER): Payer: 59 | Admitting: Psychiatry

## 2022-11-20 DIAGNOSIS — F411 Generalized anxiety disorder: Secondary | ICD-10-CM
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
## 2022-12-03 ENCOUNTER — Ambulatory Visit: Payer: 59 | Admitting: Psychiatry

## 2022-12-03 DIAGNOSIS — F411 Generalized anxiety disorder: Secondary | ICD-10-CM
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
## 2022-12-17 ENCOUNTER — Ambulatory Visit (INDEPENDENT_AMBULATORY_CARE_PROVIDER_SITE_OTHER): Payer: 59 | Admitting: Psychiatry

## 2022-12-17 DIAGNOSIS — F411 Generalized anxiety disorder: Secondary | ICD-10-CM | POA: Diagnosis not present
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
## 2022-12-31 ENCOUNTER — Ambulatory Visit (INDEPENDENT_AMBULATORY_CARE_PROVIDER_SITE_OTHER): Payer: 59 | Admitting: Psychiatry

## 2022-12-31 DIAGNOSIS — F411 Generalized anxiety disorder: Secondary | ICD-10-CM
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
## 2023-01-14 ENCOUNTER — Ambulatory Visit (INDEPENDENT_AMBULATORY_CARE_PROVIDER_SITE_OTHER): Payer: 59 | Admitting: Psychiatry

## 2023-01-14 DIAGNOSIS — F411 Generalized anxiety disorder: Secondary | ICD-10-CM
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
## 2023-01-29 ENCOUNTER — Ambulatory Visit (INDEPENDENT_AMBULATORY_CARE_PROVIDER_SITE_OTHER): Payer: 59 | Admitting: Psychiatry

## 2023-01-29 DIAGNOSIS — F411 Generalized anxiety disorder: Secondary | ICD-10-CM | POA: Diagnosis not present
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
## 2023-02-11 ENCOUNTER — Ambulatory Visit (INDEPENDENT_AMBULATORY_CARE_PROVIDER_SITE_OTHER): Payer: 59 | Admitting: Psychiatry

## 2023-02-11 DIAGNOSIS — F411 Generalized anxiety disorder: Secondary | ICD-10-CM
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
## 2023-02-16 ENCOUNTER — Other Ambulatory Visit: Payer: Self-pay | Admitting: Internal Medicine

## 2023-02-16 ENCOUNTER — Other Ambulatory Visit (HOSPITAL_BASED_OUTPATIENT_CLINIC_OR_DEPARTMENT_OTHER): Payer: Self-pay

## 2023-02-16 ENCOUNTER — Encounter: Payer: Self-pay | Admitting: Allergy

## 2023-02-16 ENCOUNTER — Ambulatory Visit: Payer: 59 | Admitting: Allergy

## 2023-02-16 VITALS — BP 118/70 | HR 78 | Temp 97.2°F | Resp 16 | Ht 62.0 in | Wt 249.8 lb

## 2023-02-16 DIAGNOSIS — J3089 Other allergic rhinitis: Secondary | ICD-10-CM | POA: Diagnosis not present

## 2023-02-16 DIAGNOSIS — J4520 Mild intermittent asthma, uncomplicated: Secondary | ICD-10-CM

## 2023-02-16 DIAGNOSIS — J302 Other seasonal allergic rhinitis: Secondary | ICD-10-CM

## 2023-02-16 DIAGNOSIS — H1013 Acute atopic conjunctivitis, bilateral: Secondary | ICD-10-CM | POA: Diagnosis not present

## 2023-02-16 MED ORDER — RYALTRIS 665-25 MCG/ACT NA SUSP
2.0000 | Freq: Two times a day (BID) | NASAL | 5 refills | Status: DC
  Filled 2023-02-16: qty 29, 30d supply, fill #0

## 2023-02-16 MED ORDER — LEVOCETIRIZINE DIHYDROCHLORIDE 5 MG PO TABS
5.0000 mg | ORAL_TABLET | Freq: Every evening | ORAL | 5 refills | Status: DC
  Filled 2023-02-16: qty 32, 32d supply, fill #0
  Filled 2023-05-19: qty 32, 32d supply, fill #1

## 2023-02-16 MED ORDER — EPINEPHRINE 0.3 MG/0.3ML IJ SOAJ
0.3000 mg | INTRAMUSCULAR | 1 refills | Status: DC | PRN
  Filled 2023-02-16: qty 2, 2d supply, fill #0
  Filled 2024-01-02: qty 2, 2d supply, fill #1

## 2023-02-16 NOTE — Patient Instructions (Signed)
-   Testing today showed: grasses, ragweed, weeds, trees, indoor molds, outdoor molds, dust mites, and cat. - Copy of test results provided.  - Avoidance measures provided. - Continue with: Singulair (montelukast)  daily.  Best to take at bedtime.  Pataday (olopatadine) one drop per eye daily as needed for itchy/watery eyes. - Start taking: Allegra or Xyzal 1 tab daily.  These are long-acting antihistamines that are over-the-counter options.  Ryaltris 2 sprays each nostril twice a day as needed for runny or stuffy nose.  - You can use an extra dose of the antihistamine, if needed, for breakthrough symptoms.  - Consider nasal saline rinses 1-2 times daily to remove allergens from the nasal cavities as well as help with mucous clearance (this is especially helpful to do before the nasal sprays are given) - Consider allergy shots as a means of long-term control. - Allergy shots "re-train" and "reset" the immune system to ignore environmental allergens and decrease the resulting immune response to those allergens (sneezing, itchy watery eyes, runny nose, nasal congestion, etc).    - Allergy shots improve symptoms in 75-85% of patients.  - Information on allergy shots and insurance coverage provided. If ready to start allergy shots you can schedule a new start appointment   - Daily controller medication(s): Singulair as above - Prior to physical activity: albuterol 2 puffs 10-15 minutes before physical activity. - Rescue medications: albuterol 2 puffs every 4-6 hours as needed  - Asthma control goals:  * Full participation in all desired activities (may need albuterol before activity) * Albuterol use two time or less a week on average (not counting use with activity) * Cough interfering with sleep two time or less a month * Oral steroids no more than once a year * No hospitalizations    Follow-up in 4-6 months or sooner if needed

## 2023-02-16 NOTE — Progress Notes (Signed)
New Patient Note  RE: Brianna Barr MRN: [id number] DOB: 12/08/1995 Date of Office Visit: 02/16/2023   Primary care provider: Deeann Saint, MD  Chief Complaint: allergies  History of present illness: Brianna Barr is a 27 y.o. female presenting today for evaluation of allergic rhinitis.  She states she was tested for environmental allergies as a child and was offered allergen immunotherapy but did not undergo this at that time.  She is now interested in this option to help manage her allergies better.  She reports sneezing, runny nose, itchy eyes.  Symptoms are year-round but worse in the spring and fall.  If allergy symptoms are really bad it triggers her asthma.  She is on Singulair that she restarted 2 years ago and takes in the morning.  Occasionally will take zyrtec and will use pataday as needed.  Both of these as needed medications she feels does help.    She has history of childhood asthma with triggers include allergies and exercise.  She states really only needs to use albuterol inhaler with respiratory illnesses. No hospitalation or prednisone courses.    She states she did have childhood eczema that resolved.   No history of food allergy.    Review of systems: Review of Systems  Constitutional: Negative.   HENT:         See HPI  Eyes:        See HPI  Respiratory: Negative.    Cardiovascular: Negative.   Gastrointestinal: Negative.   Musculoskeletal: Negative.   Skin: Negative.   Allergic/Immunologic: Negative.   Neurological: Negative.     All other systems negative unless noted above in HPI  Past medical history: Past Medical History:  Diagnosis Date   Anxiety    Asthma    Depression    Overactive bladder    Stress incontinence     Past surgical history: Past Surgical History:  Procedure Laterality Date   WISDOM TOOTH EXTRACTION      Family history:  Family History  Problem Relation Age of Onset   Multiple sclerosis Mother     Hypertension Father    Hypertension Maternal Grandmother    Diabetes Maternal Grandmother    Heart disease Maternal Grandmother    Cervical cancer Maternal Grandmother    Cirrhosis Maternal Grandfather    Stroke Paternal Grandmother    Heart disease Paternal Grandmother    Hypertension Paternal Grandmother    Diabetes Paternal Grandmother    Endometrial cancer Paternal Grandmother    Stroke Paternal Grandfather    Colon cancer Neg Hx    Colon polyps Neg Hx    Pancreatic cancer Neg Hx    Esophageal cancer Neg Hx    Liver disease Neg Hx    Stomach cancer Neg Hx     Social history: Lives in a townhome without carpeting with heat pump heating with fan and heat pump cooling.  No pets in the home.  No concern for water damage, mildew or roaches in the home. She is an Char[employer] fundraiser.  Denies smoking history.    Medication List: Current Outpatient Medications  Medication Sig Dispense Refill   albuterol (VENTOLIN HFA) 108 (90 Base) MCG/ACT inhaler Inhale 2 puffs by mouth into the lungs every 6 (six) hours as needed for wheezing or shortness of breath. 6.7 g 4   buPROPion (WELLBUTRIN XL) 150 MG 24 hr tablet Take 3 tablets (450 mg total) by mouth every morning. 270 tablet 3   hydrOXYzine (ATARAX) 25 MG  tablet Take 1 tablet (25 mg total) by mouth 3 (three) times daily as needed. 270 tablet 3   linaclotide (LINZESS) 145 MCG CAPS capsule Take 1 capsule (145 mcg total) by mouth daily before breakfast. 90 capsule 3   montelukast (SINGULAIR) 10 MG tablet Take 1 tablet (10 mg total) by mouth at bedtime. 90 tablet 0   propranolol (INDERAL) 10 MG tablet Take 1 tablet (10 mg total) by mouth 3 (three) times daily. 270 tablet 3   No current facility-administered medications for this visit.    Known medication allergies: Allergies  Allergen Reactions   Covid-19 (Mrna Bivalent) Vaccine Proofreader) [Covid-19 (Mrna) Vaccine] Other (See Comments)    Arm pain, stiffness in arm and neck, redness and swelling of arm,  severe itching     Physical examination: Blood pressure 118/70, pulse 78, temperature (!) 97.2 F (36.2 C), temperature source Temporal, resp. rate 16, height  (1.575 m), weight 249 lb 12.8 oz (113.3 kg), SpO2 99 %.  General: Alert, interactive, in no acute distress. HEENT: PERRLA, TMs pearly gray, turbinates moderately edematous without discharge, post-pharynx non erythematous. Neck: Supple without lymphadenopathy. Lungs: Clear to auscultation without wheezing, rhonchi or rales. {no increased work of breathing. CV: Normal S1, S2 without murmurs. Abdomen: Nondistended, nontender. Skin: Warm and dry, without lesions or rashes. Extremities:  No clubbing, cyanosis or edema. Neuro:   Grossly intact.  Diagnositics/Labs:  Spirometry: FEV1: 2.14L 82%, FVC: 2.71L 89%, ratio consistent with nonobstructive pattern  Allergy testing:   Airborne Adult Perc - 02/16/23 0954     Time Antigen Placed 0981    Allergen Manufacturer Waynette Buttery    Location Back    Number of Test 59    1. Control-Buffer 50% Glycerol Negative    2. Control-Histamine 1 mg/ml 2+    3. Albumin saline Negative    4. Bahia 2+    5. [place] Negative    6. Johnson Negative    7. Kentucky Blue 3+    8. Meadow Fescue Negative    9. Perennial Rye 3+    10. Sweet Vernal 2+    11. Timothy Negative    12. Cocklebur Negative    13. Burweed Marshelder Negative    14. Ragweed, short Negative    15. Ragweed, Giant Negative    16. Plantain,  English Negative    17. Lamb's Quarters Negative    18. Sheep Sorrell Negative    19. Rough Pigweed Negative    20. Marsh Elder, Rough Negative    21. Mugwort, Common Negative    22. Ash mix 4+    23. Birch mix 2+    24. Beech American Negative    25. Box, Elder Negative    26. Cedar, red Negative    27. Cottonwood, Guinea[place] Negative    28. Elm mix Negative    29. Hickory Negative    30. Maple mix Negative    31. Oak, [place] mix Negative    32. Pecan Pollen 2+    33. Pine mix  Negative    34. Sycamore Eastern Negative    35. Walnut, Black Pollen Negative    36. Alternaria alternata Negative    37. Cladosporium Herbarum Negative    38. Aspergillus mix Negative    39. Penicillium mix Negative    40. Bipolaris sorokiniana (Helminthosporium) Negative    41. Drechslera spicifera (Curvularia) Negative    42. Mucor plumbeus Negative    43. Fusarium moniliforme Negative    44. Aureobasidium pullulans (  pullulara) Negative    45. Rhizopus oryzae Negative    46. Botrytis cinera Negative    47. Epicoccum nigrum Negative    48. Phoma betae Negative    49. Candida Albicans Negative    50. Trichophyton mentagrophytes Negative    51. Mite, D Farinae  5,000 AU/ml Negative    52. Mite, D Pteronyssinus  5,000 AU/ml Negative    53. Cat Hair 10,000 BAU/ml Negative    54.  Dog Epithelia Negative    55. Mixed Feathers Negative    56. Horse Epithelia Negative    57. Cockroach, German Negative    58. Mouse Negative    59. Tobacco Leaf Negative             Intradermal - 02/16/23 1120     Time Antigen Placed 1121    Allergen Manufacturer Waynette Buttery    Location Arm    Number of Test 11    Control Negative    Ragweed mix 4+    Weed mix 4+    Mold 1 4+    Mold 2 4+    Mold 3 4+    Mold 4 4+    Cat 3+    Dog Negative    Cockroach Negative    Mite mix 4+             Allergy testing results were read and interpreted by provider, documented by clinical staff.   Assessment and plan: Allergic rhinitis with conjunctivitis -Testing today showed: grasses, ragweed, weeds, trees, indoor molds, outdoor molds, dust mites, and cat. - Copy of test results provided.  - Avoidance measures provided. - Continue with: Singulair (montelukast) 10mg  daily.  Best to take at bedtime.  Pataday (olopatadine) one drop per eye daily as needed for itchy/watery eyes. - Start taking: Allegra or Xyzal 1 tab daily.  These are long-acting antihistamines that are over-the-counter options.   Ryaltris 2 sprays each nostril twice a day as needed for runny or stuffy nose.  - You can use an extra dose of the antihistamine, if needed, for breakthrough symptoms.  - Consider nasal saline rinses 1-2 times daily to remove allergens from the nasal cavities as well as help with mucous clearance (this is especially helpful to do before the nasal sprays are given) - Consider allergy shots as a means of long-term control. - Allergy shots "re-train" and "reset" the immune system to ignore environmental allergens and decrease the resulting immune response to those allergens (sneezing, itchy watery eyes, runny nose, nasal congestion, etc).    - Allergy shots improve symptoms in 75-85% of patients.  - Information on allergy shots and insurance coverage provided. If ready to start allergy shots you can schedule a new start appointment  Mild Asthma - Daily controller medication(s): Singulair as above - Prior to physical activity: albuterol 2 puffs 10-15 minutes before physical activity. - Rescue medications: albuterol 2 puffs every 4-6 hours as needed  - Asthma control goals:  * Full participation in all desired activities (may need albuterol before activity) * Albuterol use two time or less a week on average (not counting use with activity) * Cough interfering with sleep two time or less a month * Oral steroids no more than once a year * No hospitalizations    Follow-up in 4-6 months or sooner if needed   I appreciate the opportunity to take part in Brianna Barr's care. Please do not hesitate to contact me with questions.  Sincerely,   Margo Aye, MD Allergy/Immunology  [facility]

## 2023-02-17 ENCOUNTER — Other Ambulatory Visit (HOSPITAL_BASED_OUTPATIENT_CLINIC_OR_DEPARTMENT_OTHER): Payer: Self-pay

## 2023-02-17 ENCOUNTER — Encounter: Payer: Self-pay | Admitting: Allergy

## 2023-02-17 MED ORDER — MONTELUKAST SODIUM 10 MG PO TABS
10.0000 mg | ORAL_TABLET | Freq: Every day | ORAL | 0 refills | Status: DC
  Filled 2023-02-17: qty 90, 90d supply, fill #0

## 2023-02-18 ENCOUNTER — Other Ambulatory Visit: Payer: Self-pay | Admitting: *Deleted

## 2023-02-18 ENCOUNTER — Other Ambulatory Visit (HOSPITAL_BASED_OUTPATIENT_CLINIC_OR_DEPARTMENT_OTHER): Payer: Self-pay

## 2023-02-18 MED ORDER — OLOPATADINE HCL 0.2 % OP SOLN
1.0000 [drp] | Freq: Every day | OPHTHALMIC | 5 refills | Status: DC | PRN
  Filled 2023-02-18: qty 2.5, 25d supply, fill #0

## 2023-02-26 ENCOUNTER — Ambulatory Visit (INDEPENDENT_AMBULATORY_CARE_PROVIDER_SITE_OTHER): Payer: 59 | Admitting: Psychiatry

## 2023-02-26 ENCOUNTER — Other Ambulatory Visit (HOSPITAL_BASED_OUTPATIENT_CLINIC_OR_DEPARTMENT_OTHER): Payer: Self-pay

## 2023-02-26 DIAGNOSIS — F411 Generalized anxiety disorder: Secondary | ICD-10-CM | POA: Diagnosis not present
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
## 2023-03-03 ENCOUNTER — Ambulatory Visit: Payer: 59 | Admitting: Internal Medicine

## 2023-03-12 ENCOUNTER — Ambulatory Visit (INDEPENDENT_AMBULATORY_CARE_PROVIDER_SITE_OTHER): Payer: 59 | Admitting: Psychiatry

## 2023-03-12 DIAGNOSIS — F411 Generalized anxiety disorder: Secondary | ICD-10-CM
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
## 2023-03-25 ENCOUNTER — Ambulatory Visit: Payer: 59 | Admitting: Psychiatry

## 2023-04-07 NOTE — Addendum Note (Signed)
Addended by: Lorrin Mais on: 04/07/2023 05:11 PM   Modules accepted: Orders

## 2023-04-08 ENCOUNTER — Ambulatory Visit (INDEPENDENT_AMBULATORY_CARE_PROVIDER_SITE_OTHER): Payer: 59 | Admitting: Psychiatry

## 2023-04-08 DIAGNOSIS — F411 Generalized anxiety disorder: Secondary | ICD-10-CM

## 2023-04-08 NOTE — Progress Notes (Signed)
VIALS EXP 04-07-24

## 2023-04-08 NOTE — Progress Notes (Signed)
Aeroallergen Immunotherapy   Ordering Provider: Dr. Margo Aye   Patient Details  Name: Brianna Barr  MRN: [id number]  Date of Birth: 1996/04/02   Order 2 of 2   Vial Label: Mold, mite   0.2 ml (Volume)  1:20 Concentration -- Alternaria alternata  0.2 ml (Volume)  1:20 Concentration -- Cladosporium herbarum  0.2 ml (Volume)  1:10 Concentration -- Aspergillus mix  0.2 ml (Volume)  1:10 Concentration -- Penicillium mix  0.2 ml (Volume)  1:20 Concentration -- Bipolaris sorokiniana  0.2 ml (Volume)  1:20 Concentration -- Drechslera spicifera  0.2 ml (Volume)  1:10 Concentration -- Mucor plumbeus  0.2 ml (Volume)  1:10 Concentration -- Fusarium moniliforme  0.2 ml (Volume)  1:40 Concentration -- Aureobasidium pullulans  0.2 ml (Volume)  1:10 Concentration -- Rhizopus oryzae  0.5 ml (Volume)   AU Concentration -- Mite Mix (DF 5,000 & DP 5,000)    2.5  ml Extract Subtotal  2.5  ml Diluent  5.0  ml Maintenance Total   Schedule:  RUSH then schedule B  Silver Vial (1:1,000,000): Schedule B (6 doses)  Blue Vial (1:100,000): Schedule B (6 doses)  Yellow Vial (1:10,000): Schedule B (6 doses)  Green Vial (1:1,000): Schedule B (6 doses)  Red Vial (1:100): Schedule A (14 doses)   Special Instructions: RUSH in HP and continue in HP  1-2 injections/week

## 2023-04-08 NOTE — Progress Notes (Signed)
Aeroallergen Immunotherapy  Ordering Provider: Dr. Margo Aye  Patient Details Name: Brianna Barr MRN: [id number] Date of Birth: [birth year]  Order 1 of 2  Vial Label: Pollen, cat  0.3 ml (Volume)  BAU Concentration -- 7 Grass Mix* 100,000 ([address] [place], Cucumber, [place], Perennial [place], Red[place], Sweet Vernal, Timothy) 0.2 ml (Volume)  1:20 Concentration -- [place] 0.3 ml (Volume)  1:20 Concentration -- Ragweed Mix 0.5 ml (Volume)  1:20 Concentration -- Weed Mix* 0.5 ml (Volume)  1:20 Concentration -- Eastern 10 Tree Mix (also Sweet Gum) 0.2 ml (Volume)  1:10 Concentration -- Pecan Pollen 0.5 ml (Volume)  1:10 Concentration -- Cat Hair   2.5  ml Extract Subtotal 2.5  ml Diluent 5.0  ml Maintenance Total  Schedule:  RUSH then schedule B Silver Vial (1:1,000,000): Schedule B (6 doses) Blue Vial (1:100,000): Schedule B (6 doses) Yellow Vial (1:10,000): Schedule B (6 doses) Green Vial (1:1,000): Schedule B (6 doses) Red Vial (1:100): Schedule A (14 doses)  Special Instructions: RUSH in HP and continue in HP  1-2 injections/week
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
## 2023-04-09 DIAGNOSIS — J3081 Allergic rhinitis due to animal (cat) (dog) hair and dander: Secondary | ICD-10-CM | POA: Diagnosis not present

## 2023-04-09 DIAGNOSIS — J3089 Other allergic rhinitis: Secondary | ICD-10-CM | POA: Diagnosis not present

## 2023-04-10 DIAGNOSIS — J3089 Other allergic rhinitis: Secondary | ICD-10-CM | POA: Diagnosis not present

## 2023-04-13 ENCOUNTER — Ambulatory Visit (INDEPENDENT_AMBULATORY_CARE_PROVIDER_SITE_OTHER): Payer: 59 | Admitting: Family Medicine

## 2023-04-13 ENCOUNTER — Encounter: Payer: Self-pay | Admitting: Family Medicine

## 2023-04-13 VITALS — BP 112/80 | HR 75 | Temp 98.5°F | Wt 251.2 lb

## 2023-04-13 DIAGNOSIS — F321 Major depressive disorder, single episode, moderate: Secondary | ICD-10-CM | POA: Diagnosis not present

## 2023-04-13 DIAGNOSIS — Z8249 Family history of ischemic heart disease and other diseases of the circulatory system: Secondary | ICD-10-CM

## 2023-04-13 DIAGNOSIS — F411 Generalized anxiety disorder: Secondary | ICD-10-CM | POA: Diagnosis not present

## 2023-04-13 DIAGNOSIS — E7800 Pure hypercholesterolemia, unspecified: Secondary | ICD-10-CM

## 2023-04-13 DIAGNOSIS — R002 Palpitations: Secondary | ICD-10-CM

## 2023-04-13 NOTE — Progress Notes (Signed)
Established Patient Office Visit   Subjective  Patient ID: Brianna Barr, female    DOB: [birth year]  Age: 27 y.o. MRN: [id number]  Chief Complaint  Patient presents with   Labs Only    Recently made appt for palpitations, dad came home and informed her to get tested for LPA     Patient is a 27 year old female seen for follow-up/request labs.  Patient states she has noticed an intermittent "quick tightening/flutter" in chest 5-6 times since March 28, 2023.  Patient states this is different from anxiety/panic attack sensation.  Apple Watch alerted her to 1 episode where her heart rate was in the 130s.  May have occasional lightheadedness with episodes.  Episodes last few seconds to a minute.  No association with activity or food and symptoms.  Patient does not drink caffeine, eat spicy foods or eat much chocolate.  Staying hydrated.  States sleep is okay.  Pt consistent with meds for anxiety and depression.  Also in counseling.  Patient also more concerned as her dad was recently told his LPA was elevated and that she should get tested.  Patient's LDL was 132 on 06/05/2022.  Patient worked last night and had breakfast of [place] toast, eggs, and bacon prior to coming into appointment.    Past Medical History:  Diagnosis Date   Anxiety    Asthma    Depression    Overactive bladder    Stress incontinence    Past Surgical History:  Procedure Laterality Date   WISDOM TOOTH EXTRACTION     Social History   Tobacco Use   Smoking status: Never   Smokeless tobacco: Never  Vaping Use   Vaping Use: Never used  Substance Use Topics   Alcohol use: No   Drug use: No   Family History  Problem Relation Age of Onset   Multiple sclerosis Mother    Hypertension Father    Hypertension Maternal Grandmother    Diabetes Maternal Grandmother    Heart disease Maternal Grandmother    Cervical cancer Maternal Grandmother    Cirrhosis Maternal Grandfather    Stroke Paternal Grandmother    Heart  disease Paternal Grandmother    Hypertension Paternal Grandmother    Diabetes Paternal Grandmother    Endometrial cancer Paternal Grandmother    Stroke Paternal Grandfather    Colon cancer Neg Hx    Colon polyps Neg Hx    Pancreatic cancer Neg Hx    Esophageal cancer Neg Hx    Liver disease Neg Hx    Stomach cancer Neg Hx    Allergies  Allergen Reactions   Covid-19 (Mrna Bivalent) Vaccine Proofreader) [Covid-19 (Mrna) Vaccine] Other (See Comments)    Arm pain, stiffness in arm and neck, redness and swelling of arm, severe itching      ROS Negative unless stated above    Objective:     BP 112/80 (BP Location: Right Arm, Patient Position: Sitting, Cuff Size: Normal)   Pulse 75   Temp 98.5 F (36.9 C) (Oral)   Wt 251 lb 3.2 oz (113.9 kg)   SpO2 98%   BMI 45.95 kg/m    Physical Exam Constitutional:      General: She is not in acute distress.    Appearance: Normal appearance.  HENT:     Head: Normocephalic and atraumatic.     Nose: Nose normal.     Mouth/Throat:     Mouth: Mucous membranes are moist.  Eyes:     Extraocular Movements: Extraocular  movements intact.     Conjunctiva/sclera: Conjunctivae normal.  Cardiovascular:     Rate and Rhythm: Normal rate and regular rhythm.     Heart sounds: Normal heart sounds. No murmur heard.    No gallop.  Pulmonary:     Effort: Pulmonary effort is normal. No respiratory distress.     Breath sounds: Normal breath sounds. No wheezing, rhonchi or rales.  Skin:    General: Skin is warm and dry.  Neurological:     Mental Status: She is alert and oriented to person, place, and time. Mental status is at baseline.        04/13/2023    9:42 AM 07/21/2022   11:06 AM 06/05/2022   10:03 AM  Depression screen PHQ 2/9  Decreased Interest 1 1 1   Down, Depressed, Hopeless 1 1 1   PHQ - 2 Score 2 2 2   Altered sleeping 2 2 2   Tired, decreased energy 2 2 3   Change in appetite 1 1 1   Feeling bad or failure about yourself  2 1 1    Trouble concentrating 1 1 1   Moving slowly or fidgety/restless 1 2 1   Suicidal thoughts 0 0 0  PHQ-9 Score 11 11 11   Difficult doing work/chores Somewhat difficult Somewhat difficult Very difficult      04/13/2023    9:43 AM 06/05/2022   10:38 AM 06/25/2020    3:55 PM 11/03/2019    4:50 PM  GAD 7 : Generalized Anxiety Score  Nervous, Anxious, on Edge 3 3 3 2   Control/stop worrying 3 3 3 1   Worry too much - different things 3 3 3 2   Trouble relaxing 3 2 3 1   Restless 2 1 2  0  Easily annoyed or irritable 2 1 2  0  Afraid - awful might happen 3 3 2 1   Total GAD 7 Score 19 16 18 7   Anxiety Difficulty Very difficult Extremely difficult       No results found for any visits on 04/13/23.    Assessment & Plan:  Palpitations -     Lipid panel; Future -     Ambulatory referral to Cardiology -     Lipoprotein A (LPA); Future -     CBC with Differential/Platelet; Future -     TSH; Future -     T4, free; Future  Family history of cardiovascular disease -     Lipid panel; Future -     Ambulatory referral to Cardiology -     Lipoprotein A (LPA); Future  Elevated LDL cholesterol level -     Lipid panel; Future  GAD (generalized anxiety disorder) -GAD-7 score 19 -     TSH; Future -     T4, free; Future  Depression, major, single episode, moderate (HCC) -PHQ-9 score 11 -     CBC with Differential/Platelet; Future -     T4, free; Future  New problems.  Referral to cardiology for Holter monitor to further evaluate palpitations.  Will obtain labs including lipid panel when patient is fasting.  Continue current medications for anxiety and depression as well as counseling.  Return if symptoms worsen or fail to improve.   Deeann Saint, MD

## 2023-04-14 ENCOUNTER — Other Ambulatory Visit (INDEPENDENT_AMBULATORY_CARE_PROVIDER_SITE_OTHER): Payer: 59

## 2023-04-14 ENCOUNTER — Other Ambulatory Visit: Payer: Self-pay

## 2023-04-14 DIAGNOSIS — F321 Major depressive disorder, single episode, moderate: Secondary | ICD-10-CM | POA: Diagnosis not present

## 2023-04-14 DIAGNOSIS — F411 Generalized anxiety disorder: Secondary | ICD-10-CM

## 2023-04-14 DIAGNOSIS — E7800 Pure hypercholesterolemia, unspecified: Secondary | ICD-10-CM

## 2023-04-14 DIAGNOSIS — Z8249 Family history of ischemic heart disease and other diseases of the circulatory system: Secondary | ICD-10-CM | POA: Diagnosis not present

## 2023-04-14 DIAGNOSIS — R002 Palpitations: Secondary | ICD-10-CM | POA: Diagnosis not present

## 2023-04-14 LAB — CBC WITH DIFFERENTIAL/PLATELET
Basophils Absolute: 0 10*3/uL (ref 0.0–0.1)
Basophils Relative: 0.5 % (ref 0.0–3.0)
Eosinophils Absolute: 0.1 10*3/uL (ref 0.0–0.7)
Eosinophils Relative: 1.3 % (ref 0.0–5.0)
HCT: 41.6 % (ref 36.0–46.0)
Hemoglobin: 13.6 g/dL (ref 12.0–15.0)
Lymphocytes Relative: 24.5 % (ref 12.0–46.0)
Lymphs Abs: 2.2 10*3/uL (ref 0.7–4.0)
MCHC: 32.6 g/dL (ref 30.0–36.0)
MCV: 81.7 fl (ref 78.0–100.0)
Monocytes Absolute: 0.5 10*3/uL (ref 0.1–1.0)
Monocytes Relative: 5.9 % (ref 3.0–12.0)
Neutro Abs: 6 10*3/uL (ref 1.4–7.7)
Neutrophils Relative %: 67.8 % (ref 43.0–77.0)
Platelets: 378 10*3/uL (ref 150.0–400.0)
RBC: 5.09 Mil/uL (ref 3.87–5.11)
RDW: 13.9 % (ref 11.5–15.5)
WBC: 8.9 10*3/uL (ref 4.0–10.5)

## 2023-04-14 LAB — LIPID PANEL
Cholesterol: 178 mg/dL (ref 0–200)
HDL: 42.3 mg/dL (ref >39.00)
LDL Cholesterol: 122 mg/dL — ABNORMAL HIGH (ref 0–99)
NonHDL: 135.38
Total CHOL/HDL Ratio: 4
Triglycerides: 68 mg/dL (ref 0.0–149.0)
VLDL: 13.6 mg/dL (ref 0.0–40.0)

## 2023-04-14 LAB — T4, FREE: Free T4: 0.82 ng/dL (ref 0.60–1.60)

## 2023-04-14 LAB — TSH: TSH: 2.65 u[IU]/mL (ref 0.35–5.50)

## 2023-04-14 MED ORDER — FAMOTIDINE 20 MG PO TABS: ORAL_TABLET | ORAL | 0 refills | Status: DC

## 2023-04-14 MED ORDER — PREDNISONE 20 MG PO TABS: ORAL_TABLET | ORAL | 0 refills | Status: DC

## 2023-04-14 NOTE — Telephone Encounter (Signed)
Pre-meds sent to [facility] and instructions sent via mychart. 6/18. Pt notify

## 2023-04-15 ENCOUNTER — Other Ambulatory Visit (HOSPITAL_BASED_OUTPATIENT_CLINIC_OR_DEPARTMENT_OTHER): Payer: Self-pay

## 2023-04-15 ENCOUNTER — Other Ambulatory Visit: Payer: Self-pay

## 2023-04-15 MED ORDER — PREDNISONE 20 MG PO TABS
ORAL_TABLET | ORAL | 0 refills | Status: DC
  Filled 2023-04-15: qty 4, 2d supply, fill #0

## 2023-04-15 MED ORDER — FAMOTIDINE 20 MG PO TABS
ORAL_TABLET | ORAL | 0 refills | Status: DC
  Filled 2023-04-15: qty 4, 2d supply, fill #0

## 2023-04-17 LAB — LIPOPROTEIN A (LPA): Lipoprotein (a): 568 nmol/L — ABNORMAL HIGH (ref <75)

## 2023-04-23 ENCOUNTER — Ambulatory Visit (INDEPENDENT_AMBULATORY_CARE_PROVIDER_SITE_OTHER): Payer: 59 | Admitting: Psychiatry

## 2023-04-23 DIAGNOSIS — F411 Generalized anxiety disorder: Secondary | ICD-10-CM

## 2023-04-23 NOTE — Progress Notes (Signed)
RAPID DESENSITIZATION Note  RE: Brianna Barr MRN: [id number] DOB: Jul 12, 1996 Date of Office Visit: 04/24/2023  Subjective:  Patient presents today for rapid desensitization.  Interval History: Patient has not been ill, she has taken all premedications as per protocol.  Recent/Current History: Pulmonary disease: no Cardiac disease: no Respiratory infection: no Rash: no Itch: no Swelling: no Cough: no Shortness of breath: no Runny/stuffy nose: no Itchy eyes: no Beta-blocker use: no  Patient/guardian was informed of the procedure with verbalized understanding of the risk of anaphylaxis. Consent has been signed.   Medication List:  Current Outpatient Medications  Medication Sig Dispense Refill   albuterol (VENTOLIN HFA) 108 (90 Base) MCG/ACT inhaler Inhale 2 puffs by mouth into the lungs every 6 (six) hours as needed for wheezing or shortness of breath. 6.7 g 4   buPROPion (WELLBUTRIN XL) 150 MG 24 hr tablet Take 3 tablets (450 mg total) by mouth every morning. 270 tablet 3   EPINEPHrine 0.3 mg/0.3 mL IJ SOAJ injection Inject 0.3 mg into the muscle as needed for anaphylaxis. 2 each 1   famotidine (PEPCID) 20 MG tablet Take 1 tab twice daily the day before and day off RUSH appt. 4 tablet 0   hydrOXYzine (ATARAX) 25 MG tablet Take 1 tablet (25 mg total) by mouth 3 (three) times daily as needed. 270 tablet 3   levocetirizine (XYZAL) 5 MG tablet Take 1 tablet (5 mg total) by mouth every evening. 32 tablet 5   linaclotide (LINZESS) 145 MCG CAPS capsule Take 1 capsule (145 mcg total) by mouth daily before breakfast. 90 capsule 3   montelukast (SINGULAIR) 10 MG tablet Take 1 tablet (10 mg total) by mouth at bedtime. 90 tablet 0   Olopatadine HCl 0.2 % SOLN Place 1 drop into both eyes daily as needed. 2.5 mL 5   Olopatadine-Mometasone (RYALTRIS) 665-25 MCG/ACT SUSP Place 2 sprays into the nose 2 (two) times daily. 29 g 5   predniSONE (DELTASONE) 20 MG tablet Take 2 tabs the day  before RUSH appt in the morning and the day off appt. 4 tablet 0   propranolol (INDERAL) 10 MG tablet Take 1 tablet (10 mg total) by mouth 3 (three) times daily. 270 tablet 3   No current facility-administered medications for this visit.   Allergies: Allergies  Allergen Reactions   Covid-19 (Mrna Bivalent) Vaccine Proofreader) [Covid-19 (Mrna) Vaccine] Other (See Comments)    Arm pain, stiffness in arm and neck, redness and swelling of arm, severe itching   I reviewed her past medical history, social history, family history, and environmental history and no significant changes have been reported from her previous visit.  ROS: Negative except as per HPI.  Objective: BP 118/68   Pulse 82   Temp 97.9 F (36.6 C) (Temporal)   Resp 16   SpO2 98%  There is no height or weight on file to calculate BMI.   General Appearance:  Alert, cooperative, no distress, appears stated age  Head:  Normocephalic, without obvious abnormality, atraumatic  Eyes:  Conjunctiva clear, EOM's intact  Nose: Nares normal  Throat: Lips, tongue normal; teeth and gums normal, normal posterior oropharnyx  Neck: Supple, symmetrical  Lungs:   CTAB, Respirations unlabored, no coughing  Heart:  Appears well perfused  Extremities: No edema  Skin: Skin color, texture, turgor normal, no rashes or lesions on visualized portions of skin  Neurologic: No gross deficits     Diagnostics:  PROCEDURES:  Patient received the following doses every hour:  Step 1:  0.77ml - 1:1,000,000 dilution (silver vial) Step 2:  0.59ml - 1:1,000,000 dilution (silver vial) Step 3: 0.19ml - 1:100,000 dilution (blue vial)  Step 4: 0.53ml - 1:100,000 dilution (blue vial)  Step 5: 0.47ml - 1:10,000 dilution (gold vial) Step 6: 0.71ml - 1:10,000 dilution (gold vial) Step 7: 0.41ml - 1:10,000 dilution (gold vial) Step 8: 0.75ml - 1:10,000 dilution (gold vial)  Patient was observed for 1 hour after the last dose.   Procedure started at 8:30  AM Procedure ended at 2:40 PM   ASSESSMENT/PLAN:   Patient has tolerated the rapid desensitization protocol.  Next appointment: Start at 0.51ml of 1:1000 dilution (green vial) and build up per protocol.
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
## 2023-04-24 ENCOUNTER — Ambulatory Visit: Payer: 59 | Admitting: Internal Medicine

## 2023-04-24 ENCOUNTER — Encounter: Payer: Self-pay | Admitting: Internal Medicine

## 2023-04-24 VITALS — BP 118/68 | HR 82 | Temp 97.9°F | Resp 16

## 2023-04-24 DIAGNOSIS — J302 Other seasonal allergic rhinitis: Secondary | ICD-10-CM | POA: Diagnosis not present

## 2023-04-24 DIAGNOSIS — J3089 Other allergic rhinitis: Secondary | ICD-10-CM

## 2023-04-24 DIAGNOSIS — J309 Allergic rhinitis, unspecified: Secondary | ICD-10-CM

## 2023-05-04 ENCOUNTER — Ambulatory Visit (INDEPENDENT_AMBULATORY_CARE_PROVIDER_SITE_OTHER): Payer: 59

## 2023-05-04 DIAGNOSIS — J309 Allergic rhinitis, unspecified: Secondary | ICD-10-CM | POA: Diagnosis not present

## 2023-05-07 ENCOUNTER — Other Ambulatory Visit: Payer: Self-pay | Admitting: Oncology

## 2023-05-07 DIAGNOSIS — Z006 Encounter for examination for normal comparison and control in clinical research program: Secondary | ICD-10-CM

## 2023-05-08 ENCOUNTER — Ambulatory Visit (INDEPENDENT_AMBULATORY_CARE_PROVIDER_SITE_OTHER): Payer: 59 | Admitting: Psychiatry

## 2023-05-08 ENCOUNTER — Encounter: Payer: Self-pay | Admitting: Psychiatry

## 2023-05-08 DIAGNOSIS — F411 Generalized anxiety disorder: Secondary | ICD-10-CM | POA: Diagnosis not present
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
## 2023-05-11 ENCOUNTER — Encounter: Payer: Self-pay | Admitting: Orthopedic Surgery

## 2023-05-11 ENCOUNTER — Other Ambulatory Visit (HOSPITAL_BASED_OUTPATIENT_CLINIC_OR_DEPARTMENT_OTHER): Payer: Self-pay

## 2023-05-11 ENCOUNTER — Ambulatory Visit: Payer: 59 | Admitting: Orthopedic Surgery

## 2023-05-11 ENCOUNTER — Other Ambulatory Visit: Payer: Self-pay

## 2023-05-11 DIAGNOSIS — M5441 Lumbago with sciatica, right side: Secondary | ICD-10-CM

## 2023-05-11 DIAGNOSIS — G8929 Other chronic pain: Secondary | ICD-10-CM

## 2023-05-11 DIAGNOSIS — M5442 Lumbago with sciatica, left side: Secondary | ICD-10-CM

## 2023-05-11 MED ORDER — PREDNISONE 10 MG PO TABS
10.0000 mg | ORAL_TABLET | Freq: Every day | ORAL | 0 refills | Status: DC
  Filled 2023-05-11: qty 30, 30d supply, fill #0

## 2023-05-11 NOTE — Progress Notes (Signed)
Office Visit Note   Patient: Brianna Barr           Date of Birth: Aug 23, 1996           MRN: [id number] Visit Date: 05/11/2023              Requested by: Nadara Mustard, MD [address] [place],  [place] [address] PCP: Deeann Saint, MD  Chief Complaint  Patient presents with   Lower Back - Pain      HPI: Patient is a 27 year old woman who is seen for initial evaluation for chronic lower back pain.  Patient states the pain starts midline radiates around both hips and extends down the anterior aspect of her thighs bilaterally.  Patient has had temporary relief with prednisone in the past and she states she is down several physical therapy treatments.  Assessment & Plan: Visit Diagnoses:  1. Chronic bilateral low back pain with bilateral sciatica     Plan: Recommended continuing with the exercise and core strengthening.  A prescription for prednisone 10 mg with breakfast is called in.  Follow-Up Instructions: Return if symptoms worsen or fail to improve.   Ortho Exam  Patient is alert, oriented, no adenopathy, well-dressed, normal affect, normal respiratory effort. Examination patient has a negative straight leg raise bilaterally there is no pain with internal/external rotation of both hips.  She can walk on her toes and heels without problems.  No focal motor weakness.  She has a normal gait no adductor lurch.  Imaging: No results found. No images are attached to the encounter.  Labs: Lab Results  Component Value Date   HGBA1C 5.5 11/13/2021   HGBA1C 5.7 04/11/2021   HGBA1C 5.4 08/18/2019     Lab Results  Component Value Date   ALBUMIN 4.3 06/05/2022   ALBUMIN 3.9 04/11/2021    No results found for: "MG" Lab Results  Component Value Date   VD25OH 24.36 (L) 06/05/2022   VD25OH 21.31 (L) 11/13/2021   VD25OH 19.85 (L) 11/28/2020    No results found for: "PREALBUMIN"    Latest Ref Rng & Units 04/14/2023   10:43 AM 06/05/2022   10:38 AM 11/13/2021     9:40 AM  CBC EXTENDED  WBC 4.0 - 10.5 K/uL 8.9  8.7  11.1   RBC 3.87 - 5.11 Mil/uL 5.09  5.03  4.79   Hemoglobin 12.0 - 15.0 g/dL 19.1  47.8  29.5   HCT 36.0 - 46.0 % 41.6  41.9  39.6   Platelets 150.0 - 400.0 K/uL 378.0  322.0  336.0   NEUT# 1.4 - 7.7 K/uL 6.0  6.0  7.5   Lymph# 0.7 - 4.0 K/uL 2.2  2.2  2.7      There is no height or weight on file to calculate BMI.  Orders:  Orders Placed This Encounter  Procedures   XR Lumbar Spine 2-3 Views   Meds ordered this encounter  Medications   predniSONE (DELTASONE) 10 MG tablet    Sig: Take 1 tablet (10 mg total) by mouth daily with breakfast.    Dispense:  30 tablet    Refill:  0     Procedures: No procedures performed  Clinical Data: No additional findings.  ROS:  All other systems negative, except as noted in the HPI. Review of Systems  Objective: Vital Signs: There were no vitals taken for this visit.  Specialty Comments:  No specialty comments available.  PMFS History: Patient Active Problem List  Diagnosis Date Noted   Anxiety and depression 07/07/2019   Mild intermittent asthma without complication 07/07/2019   Seasonal allergies 07/07/2019   Plantar fasciitis 07/07/2019   Hyperhidrosis 07/07/2019   OAB (overactive bladder) 07/07/2019   Allergy 08/06/2018   Past Medical History:  Diagnosis Date   Anxiety    Asthma    Depression    Overactive bladder    Stress incontinence     Family History  Problem Relation Age of Onset   Multiple sclerosis Mother    Hypertension Father    Hypertension Maternal Grandmother    Diabetes Maternal Grandmother    Heart disease Maternal Grandmother    Cervical cancer Maternal Grandmother    Cirrhosis Maternal Grandfather    Stroke Paternal Grandmother    Heart disease Paternal Grandmother    Hypertension Paternal Grandmother    Diabetes Paternal Grandmother    Endometrial cancer Paternal Grandmother    Stroke Paternal Grandfather    Colon cancer Neg Hx     Colon polyps Neg Hx    Pancreatic cancer Neg Hx    Esophageal cancer Neg Hx    Liver disease Neg Hx    Stomach cancer Neg Hx     Past Surgical History:  Procedure Laterality Date   WISDOM TOOTH EXTRACTION     Social History   Occupational History   Not on file  Tobacco Use   Smoking status: Never   Smokeless tobacco: Never  Vaping Use   Vaping status: Never Used  Substance and Sexual Activity   Alcohol use: No   Drug use: No   Sexual activity: Not Currently    Partners: Male    Birth control/protection: I.U.D., Abstinence

## 2023-05-12 ENCOUNTER — Encounter: Payer: Self-pay | Admitting: Obstetrics and Gynecology

## 2023-05-12 ENCOUNTER — Other Ambulatory Visit (HOSPITAL_COMMUNITY)
Admission: RE | Admit: 2023-05-12 | Discharge: 2023-05-12 | Disposition: A | Discharge status: Home / Self Care | Payer: 59 | Source: Ambulatory Visit | Attending: Obstetrics and Gynecology | Admitting: Obstetrics and Gynecology

## 2023-05-12 ENCOUNTER — Ambulatory Visit (INDEPENDENT_AMBULATORY_CARE_PROVIDER_SITE_OTHER): Payer: 59 | Admitting: Obstetrics and Gynecology

## 2023-05-12 ENCOUNTER — Ambulatory Visit (INDEPENDENT_AMBULATORY_CARE_PROVIDER_SITE_OTHER): Payer: 59

## 2023-05-12 VITALS — BP 117/72 | HR 97 | Ht 62.0 in | Wt 252.0 lb

## 2023-05-12 DIAGNOSIS — E88819 Insulin resistance, unspecified: Secondary | ICD-10-CM

## 2023-05-12 DIAGNOSIS — Z1339 Encounter for screening examination for other mental health and behavioral disorders: Secondary | ICD-10-CM | POA: Diagnosis not present

## 2023-05-12 DIAGNOSIS — L689 Hypertrichosis, unspecified: Secondary | ICD-10-CM

## 2023-05-12 DIAGNOSIS — J309 Allergic rhinitis, unspecified: Secondary | ICD-10-CM

## 2023-05-12 DIAGNOSIS — Z01419 Encounter for gynecological examination (general) (routine) without abnormal findings: Secondary | ICD-10-CM

## 2023-05-12 DIAGNOSIS — N898 Other specified noninflammatory disorders of vagina: Secondary | ICD-10-CM | POA: Diagnosis not present

## 2023-05-12 DIAGNOSIS — Z975 Presence of (intrauterine) contraceptive device: Secondary | ICD-10-CM

## 2023-05-12 NOTE — Progress Notes (Unsigned)
ANNUAL EXAM Patient name: Brianna Barr MRN [id number]  Date of birth: 1996/09/02 Chief Complaint:   Annual Exam  History of Present Illness:   Brianna Barr is a 27 y.o. G0P0000  female being seen today for a routine annual exam. Doing well  overall. Known vaginismus/pelvic pain that she has been sought treatment for in past including pelvic floor PT and urogyn Current complaints: questions about PCOS, Has an IUD so unsure of cycles, however previously regular. Endorses excessive hair growth, previously lab work showed pre-DM, tried medication, not currently on anything.Marland Kitchen Has been working out  No LMP recorded. (Menstrual status: IUD). Placed in 2019   The pregnancy intention screening data noted above was reviewed. Potential methods of contraception were discussed. The patient elected to proceed with current IUD  Last pap 01/17/20. Results were: NILM w/ HRHPV not done. H/O abnormal pap: no Last mammogram: Known fibrocystic breast and has been seen by breast center. Results were: N/A. Family h/o breast cancer: yesm maternal grandmother and aunt Last colonoscopy: n/a. Results were: N/A. Family h/o colorectal cancer: no     05/12/2023   10:42 AM 04/13/2023    9:42 AM 07/21/2022   11:06 AM 06/05/2022   10:03 AM 12/19/2021   11:35 AM  Depression screen PHQ 2/9  Decreased Interest 1 1 1 1 1   Down, Depressed, Hopeless 1 1 1 1 2   PHQ - 2 Score 2 2 2 2 3   Altered sleeping 3 2 2 2 2   Tired, decreased energy 2 2 2 3 3   Change in appetite 1 1 1 1 1   Feeling bad or failure about yourself  1 2 1 1 1   Trouble concentrating 1 1 1 1 1   Moving slowly or fidgety/restless 0 1 2 1  0  Suicidal thoughts 0 0 0 0 0  PHQ-9 Score 10 11 11 11 11   Difficult doing work/chores  Somewhat difficult Somewhat difficult Very difficult         05/12/2023   10:43 AM 04/13/2023    9:43 AM 06/05/2022   10:38 AM 06/25/2020    3:55 PM  GAD 7 : Generalized Anxiety Score  Nervous, Anxious, on Edge 3 3 3 3    Control/stop worrying 3 3 3 3   Worry too much - different things 3 3 3 3   Trouble relaxing 2 3 2 3   Restless 1 2 1 2   Easily annoyed or irritable 1 2 1 2   Afraid - awful might happen 2 3 3 2   Total GAD 7 Score 15 19 16 18   Anxiety Difficulty  Very difficult Extremely difficult      Review of Systems:   Pertinent items are noted in HPI Denies any headaches, blurred vision, fatigue, shortness of breath, chest pain, abdominal pain, abnormal vaginal discharge/itching/odor/irritation, problems with periods, bowel movements, urination, or intercourse unless otherwise stated above. Pertinent History Reviewed:  Reviewed past medical,surgical, social and family history.  Reviewed problem list, medications and allergies. Physical Assessment:   Vitals:   05/12/23 1036  BP: 117/72  Pulse: 97  Weight: 252 lb (114.3 kg)  Height: 5\' 2"  (1.575 m)  Body mass index is 46.09 kg/m.        Physical Examination:   General appearance - well appearing, and in no distress  Mental status - alert, oriented to person, place, and time  Psych:  She has a normal mood and affect  Skin - warm and dry, normal color, no suspicious lesions noted  Chest - effort  normal, all lung fields clear to auscultation bilaterally  Heart - normal rate and regular rhythm  Neck:  midline trachea, no thyromegaly or nodules  Breasts - breasts appear normal, no suspicious masses, no skin or nipple changes or  axillary nodes  Abdomen - soft, nontender, nondistended, no masses or organomegaly  Pelvic - VULVA: normal appearing vulva with no masses, tenderness or lesions  VAGINA: normal appearing vagina with normal color and discharge, no lesions  CERVIX: normal appearing cervix without discharge or lesions, no CMT  Thin prep pap is not done , IUD stings visualized, sm white clumpy d/c noted   UTERUS: uterus is felt to be normal size, shape, consistency and nontender   ADNEXA: No adnexal masses or tenderness  noted.    Extremities:  No swelling or varicosities noted  Chaperone present for exam  No results found for this or any previous visit (from the past 24 hour(s)).  Assessment & Plan:  1. Well woman exam with routine gynecological exam 2. Excessive hair growth Doing well overall, discussed PCOS, symptoms, work up. Discusse treatment options such as IUD effective method for treatment/symptom management and endometrial protection. Can complete lab work and follow up more to discuss other options for insulin resistance, hirsutism. Normal tsh 6/18  - Testosterone,Free and Total - FSH/LH - Prolactin  3. Vaginal discharge Collecting swab  - Cervicovaginal ancillary only( [place])  4. IUD (intrauterine device) in place Strings visible, pleased with Iud, no concerns    Labs/procedures today:   Mammogram: @ 27yo, or sooner if problems Colonoscopy: @ 27yo, or sooner if problems  Orders Placed This Encounter  Procedures   Testosterone,Free and Total   FSH/LH   Prolactin    Meds: No orders of the defined types were placed in this encounter.   Follow-up: Follow up in one year for annual or sooner as needed  Albertine Grates, FNP

## 2023-05-13 LAB — CERVICOVAGINAL ANCILLARY ONLY
Bacterial Vaginitis (gardnerella): NEGATIVE
Candida Glabrata: NEGATIVE
Candida Vaginitis: NEGATIVE
Comment: NEGATIVE
Comment: NEGATIVE
Comment: NEGATIVE

## 2023-05-14 LAB — PROLACTIN: Prolactin: 17.6 ng/mL (ref 4.8–33.4)

## 2023-05-14 LAB — FSH/LH
FSH: 10.1 m[IU]/mL
LH: 25.2 m[IU]/mL

## 2023-05-14 LAB — TESTOSTERONE,FREE AND TOTAL
Testosterone, Free: 0.8 pg/mL (ref 0.0–4.2)
Testosterone: 32 ng/dL (ref 13–71)

## 2023-05-19 ENCOUNTER — Other Ambulatory Visit: Payer: Self-pay

## 2023-05-19 ENCOUNTER — Ambulatory Visit (INDEPENDENT_AMBULATORY_CARE_PROVIDER_SITE_OTHER): Payer: 59

## 2023-05-19 ENCOUNTER — Other Ambulatory Visit: Payer: Self-pay | Admitting: Family Medicine

## 2023-05-19 DIAGNOSIS — J309 Allergic rhinitis, unspecified: Secondary | ICD-10-CM | POA: Diagnosis not present

## 2023-05-19 DIAGNOSIS — J302 Other seasonal allergic rhinitis: Secondary | ICD-10-CM

## 2023-05-20 ENCOUNTER — Other Ambulatory Visit (HOSPITAL_BASED_OUTPATIENT_CLINIC_OR_DEPARTMENT_OTHER): Payer: Self-pay

## 2023-05-20 MED ORDER — MONTELUKAST SODIUM 10 MG PO TABS
10.0000 mg | ORAL_TABLET | Freq: Every day | ORAL | 2 refills | Status: DC
Start: 2023-05-20 — End: 2023-08-20
  Filled 2023-05-20: qty 90, 90d supply, fill #0

## 2023-05-22 ENCOUNTER — Ambulatory Visit: Payer: 59 | Admitting: Psychiatry

## 2023-05-22 DIAGNOSIS — F411 Generalized anxiety disorder: Secondary | ICD-10-CM
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
## 2023-05-25 ENCOUNTER — Ambulatory Visit (INDEPENDENT_AMBULATORY_CARE_PROVIDER_SITE_OTHER): Payer: 59

## 2023-05-25 DIAGNOSIS — J309 Allergic rhinitis, unspecified: Secondary | ICD-10-CM | POA: Diagnosis not present

## 2023-06-01 ENCOUNTER — Encounter: Payer: Self-pay | Admitting: Cardiovascular Disease

## 2023-06-01 ENCOUNTER — Ambulatory Visit: Payer: 59 | Admitting: Cardiovascular Disease

## 2023-06-01 VITALS — BP 112/60 | HR 85 | Ht 62.0 in | Wt 257.0 lb

## 2023-06-01 DIAGNOSIS — E7841 Elevated Lipoprotein(a): Secondary | ICD-10-CM | POA: Diagnosis not present

## 2023-06-01 DIAGNOSIS — R002 Palpitations: Secondary | ICD-10-CM

## 2023-06-01 NOTE — Progress Notes (Addendum)
Cardiology Office Note:  .   Date:  06/01/2023  ID:  Brianna Barr, DOB June 12, 1996, MRN [id number] PCP: Deeann Saint, MD  [facility] Providers Cardiologist:  None    History of Present Illness: .   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 Salomon Fick with complaints of palpitations.  BMI of 47 places her in morbid obesity range, but she does not have hypertension, diabetes mellitus, sleep apnea or other comorbidities.  For many years, Brianna Barr has had palpitations in association with anxiety attacks.  However over the last 2 months she has had palpitations described as a fluttering sensation in her chest that appear to occur independently of episodes of anxiety.  They occur randomly throughout the day, usually at rest.  Sometimes a associated brief sensation of chest tightness lasting for about a minute.  They did not associate a sensation of presyncope.  She denies angina or dyspnea at rest or with activity.  Does not have orthopnea, PND, edema, claudication, focal neurological complaints.  She has normal thyroid function tests and no clinical symptoms that would suggest hyperthyroidism.  She is not anemic.  The routine electrolytes are normal.  She has mild hypercholesterolemia with a recent LDL cholesterol 122 and HDL of 42, normal triglycerides.  She has a remarkably high LP(a) at 568.  Her also has a severely elevated LP(a), but has not had any vascular problems.  Her maternal grandmother at age 41 has recently had a myocardial infarction requiring placement of coronary stents and has atrial fibrillation.  Both paternal grandparents died in their mid to late 75s and had strokes and heart trouble at advanced ages.  She has a smart watch with ECG capability and has recorded multiple episodes during palpitations.  All of these uniformly show normal sinus rhythm or mild sinus tachycardia in the 100-110 bpm range.  ROS: \As  above  Studies Reviewed: Marland Kitchen   EKG Interpretation Date/Time:  Monday June 01 2023 08:58:00 EDT Ventricular Rate:  85 PR Interval:  154 QRS Duration:  80 QT Interval:  368 QTC Calculation: 437 R Axis:   49  Text Interpretation: Normal sinus rhythm Normal ECG No previous ECGs available Confirmed by ,  [phone]) on 06/01/2023 9:09:20 AM     Risk Assessment/Calculations:             Physical Exam:   VS:  BP 112/60 (BP Location: Left Arm, Patient Position: Sitting, Cuff Size: Large)   Pulse 85   Ht 5\' 2"  (1.575 m)   Wt 257 lb (116.6 kg)   SpO2 99%   BMI 47.01 kg/m    Wt Readings from Last 3 Encounters:  06/01/23 257 lb (116.6 kg)  05/12/23 252 lb (114.3 kg)  04/13/23 251 lb 3.2 oz (113.9 kg)    GEN: Well nourished, well developed in no acute distress NECK: No JVD; No carotid bruits CARDIAC: RRR, no murmurs, rubs, gallops RESPIRATORY:  Clear to auscultation without rales, wheezing or rhonchi  ABDOMEN: Soft, non-tender, non-distended EXTREMITIES:  No edema; No deformity   ASSESSMENT AND PLAN: .    Nisreen is troubled by palpitations, but she has been diligently recording her rhythm with her Apple Watch.  Good-quality tracings consistently show sinus rhythm or mild sinus tachycardia.  There is no evidence of true arrhythmia.  Sometimes she has chest tightness, but this is not reproduced during physical activity.  Recommend an echocardiogram to ensure that there is no evidence  of structural heart disease.  She can send symptomatic tracings to me through MyChart if her watch reports of atrial arrhythmia.  Okay to take propranolol as needed for the palpitations. Advised daily regular aerobic exercise to help enhance vagal tone and increase fitness.  Weight loss would be beneficial.  She has an impressively elevated LP(a).  She inherited this from her father, but he has not had any cardiac or vascular problems to date.  For the time being we do not have firmly agreed on  treatment for this and she has no evidence of vascular problems.  Will plan to refer to lipid clinic.       Dispo: Echocardiogram.  Send symptomatic tracings from her smart watch.  Follow-up as needed.  Signed, Thurmon Fair, MD

## 2023-06-01 NOTE — Patient Instructions (Signed)
Medication Instructions:  No change   Lab Work: None ordered   Testing/Procedures: Echo   Follow-Up: At [facility], you and your health needs are our priority.  As part of our continuing mission to provide you with exceptional heart care, we have created designated Provider Care Teams.  These Care Teams include your primary Cardiologist (physician) and Advanced Practice Providers (APPs -  Physician Assistants and Nurse Practitioners) who all work together to provide you with the care you need, when you need it.  We recommend signing up for the patient portal called "MyChart".  Sign up information is provided on this After Visit Summary.  MyChart is used to connect with patients for Virtual Visits (Telemedicine).  Patients are able to view lab/test results, encounter notes, upcoming appointments, etc.  Non-urgent messages can be sent to your provider as well.   To learn more about what you can do with MyChart, go to [email or web address].    Your next appointment:  As Needed    Provider:  Dr.Croitoru

## 2023-06-02 ENCOUNTER — Other Ambulatory Visit (HOSPITAL_BASED_OUTPATIENT_CLINIC_OR_DEPARTMENT_OTHER): Payer: Self-pay

## 2023-06-02 ENCOUNTER — Ambulatory Visit: Payer: 59 | Admitting: Adult Health

## 2023-06-02 MED ORDER — AMOXICILLIN 500 MG PO CAPS
500.0000 mg | ORAL_CAPSULE | Freq: Three times a day (TID) | ORAL | 0 refills | Status: DC
  Filled 2023-06-02: qty 21, 7d supply, fill #0

## 2023-06-03 ENCOUNTER — Ambulatory Visit: Payer: 59 | Admitting: Psychiatry

## 2023-06-04 ENCOUNTER — Ambulatory Visit (INDEPENDENT_AMBULATORY_CARE_PROVIDER_SITE_OTHER): Payer: 59

## 2023-06-04 DIAGNOSIS — J309 Allergic rhinitis, unspecified: Secondary | ICD-10-CM | POA: Diagnosis not present

## 2023-06-08 ENCOUNTER — Other Ambulatory Visit (HOSPITAL_BASED_OUTPATIENT_CLINIC_OR_DEPARTMENT_OTHER): Payer: Self-pay

## 2023-06-08 MED ORDER — CLINDAMYCIN HCL 150 MG PO CAPS
150.0000 mg | ORAL_CAPSULE | Freq: Three times a day (TID) | ORAL | 0 refills | Status: DC
  Filled 2023-06-08: qty 21, 7d supply, fill #0

## 2023-06-10 ENCOUNTER — Ambulatory Visit: Payer: Self-pay

## 2023-06-10 DIAGNOSIS — J309 Allergic rhinitis, unspecified: Secondary | ICD-10-CM

## 2023-06-16 ENCOUNTER — Ambulatory Visit (INDEPENDENT_AMBULATORY_CARE_PROVIDER_SITE_OTHER): Payer: 59 | Admitting: Psychiatry

## 2023-06-16 DIAGNOSIS — F411 Generalized anxiety disorder: Secondary | ICD-10-CM | POA: Diagnosis not present
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
## 2023-06-17 ENCOUNTER — Ambulatory Visit (INDEPENDENT_AMBULATORY_CARE_PROVIDER_SITE_OTHER): Payer: 59 | Admitting: *Deleted

## 2023-06-17 DIAGNOSIS — J309 Allergic rhinitis, unspecified: Secondary | ICD-10-CM

## 2023-06-19 ENCOUNTER — Ambulatory Visit (HOSPITAL_COMMUNITY): Payer: 59 | Attending: Cardiovascular Disease

## 2023-06-19 DIAGNOSIS — R002 Palpitations: Secondary | ICD-10-CM | POA: Diagnosis not present

## 2023-06-19 LAB — ECHOCARDIOGRAM COMPLETE
Area-P 1/2: 5.23 cm2
Est EF: 55
S' Lateral: 2.6 cm

## 2023-06-22 ENCOUNTER — Encounter: Payer: Self-pay | Admitting: Family Medicine

## 2023-06-22 ENCOUNTER — Ambulatory Visit (INDEPENDENT_AMBULATORY_CARE_PROVIDER_SITE_OTHER): Payer: 59 | Admitting: Family Medicine

## 2023-06-22 VITALS — BP 98/72 | HR 74 | Temp 98.0°F | Ht 61.81 in | Wt 257.0 lb

## 2023-06-22 DIAGNOSIS — Z6841 Body Mass Index (BMI) 40.0 and over, adult: Secondary | ICD-10-CM | POA: Diagnosis not present

## 2023-06-22 DIAGNOSIS — Z0000 Encounter for general adult medical examination without abnormal findings: Secondary | ICD-10-CM

## 2023-06-22 DIAGNOSIS — E559 Vitamin D deficiency, unspecified: Secondary | ICD-10-CM | POA: Diagnosis not present

## 2023-06-22 LAB — COMPREHENSIVE METABOLIC PANEL
ALT: 17 U/L (ref 0–35)
AST: 18 U/L (ref 0–37)
Albumin: 3.8 g/dL (ref 3.5–5.2)
Alkaline Phosphatase: 92 U/L (ref 39–117)
BUN: 11 mg/dL (ref 6–23)
CO2: 25 mEq/L (ref 19–32)
Calcium: 8.9 mg/dL (ref 8.4–10.5)
Chloride: 106 mEq/L (ref 96–112)
Creatinine, Ser: 0.87 mg/dL (ref 0.40–1.20)
GFR: 91.4 mL/min (ref >60.00)
Glucose, Bld: 90 mg/dL (ref 70–99)
Potassium: 4.2 mEq/L (ref 3.5–5.1)
Sodium: 140 mEq/L (ref 135–145)
Total Bilirubin: 0.2 mg/dL (ref 0.2–1.2)
Total Protein: 7 g/dL (ref 6.0–8.3)

## 2023-06-22 LAB — HEMOGLOBIN A1C: Hgb A1c MFr Bld: 5.6 % (ref 4.6–6.5)

## 2023-06-22 LAB — VITAMIN D 25 HYDROXY (VIT D DEFICIENCY, FRACTURES): VITD: 30.33 ng/mL (ref 30.00–100.00)

## 2023-06-22 NOTE — Progress Notes (Signed)
Established Patient Office Visit   Subjective  Patient ID: Brianna Barr, female    DOB: [birth year]  Age: 27 y.o. MRN: [id number]  Chief Complaint  Patient presents with   Annual Exam    Patient is a 27 year old female seen for CPE.  Patient states she has been doing well overall.  Saw cardiologist for familial and personal history of elevated LDL.  Advised to continue to monitor for now.  Patient making changes to diet.  Patient has upcoming follow-up with BH to readjust meds as she has been off of some.  Inquires about obtaining newest COVID-vaccine when available.  Had an injection site reaction to the last vaccine with pain, swelling, redness at site of injection that turned into cellulitis.    Past Medical History:  Diagnosis Date   Anxiety    Asthma    Depression    Overactive bladder    Stress incontinence    Past Surgical History:  Procedure Laterality Date   WISDOM TOOTH EXTRACTION     Social History   Tobacco Use   Smoking status: Never   Smokeless tobacco: Never  Vaping Use   Vaping status: Never Used  Substance Use Topics   Alcohol use: No   Drug use: No   Family History  Problem Relation Age of Onset   Multiple sclerosis Mother    Hypertension Father    Hypertension Maternal Grandmother    Diabetes Maternal Grandmother    Heart disease Maternal Grandmother    Cervical cancer Maternal Grandmother    Cirrhosis Maternal Grandfather    Stroke Paternal Grandmother    Heart disease Paternal Grandmother    Hypertension Paternal Grandmother    Diabetes Paternal Grandmother    Endometrial cancer Paternal Grandmother    Stroke Paternal Grandfather    Colon cancer Neg Hx    Colon polyps Neg Hx    Pancreatic cancer Neg Hx    Esophageal cancer Neg Hx    Liver disease Neg Hx    Stomach cancer Neg Hx    Allergies  Allergen Reactions   Covid-19 (Mrna Bivalent) Vaccine Proofreader) [Covid-19 (Mrna) Vaccine] Other (See Comments)    Arm pain, stiffness in  arm and neck, redness and swelling of arm, severe itching      ROS Negative unless stated above    Objective:     BP 98/72 (BP Location: Left Arm, Patient Position: Sitting, Cuff Size: Large)   Pulse 74   Temp 98 F (36.7 C) (Oral)   Ht 5' 1.81" (1.57 m)   Wt 257 lb (116.6 kg)   SpO2 98%   BMI 47.29 kg/m  BP Readings from Last 3 Encounters:  06/22/23 98/72  06/01/23 112/60  05/12/23 117/72   Wt Readings from Last 3 Encounters:  06/22/23 257 lb (116.6 kg)  06/01/23 257 lb (116.6 kg)  05/12/23 252 lb (114.3 kg)      Physical Exam Constitutional:      Appearance: Normal appearance.  HENT:     Head: Normocephalic and atraumatic.     Right Ear: Tympanic membrane, ear canal and external ear normal.     Left Ear: Tympanic membrane, ear canal and external ear normal.     Nose: Nose normal.     Mouth/Throat:     Mouth: Mucous membranes are moist.     Pharynx: No oropharyngeal exudate or posterior oropharyngeal erythema.  Eyes:     General: No scleral icterus.    Extraocular Movements: Extraocular movements intact.  Conjunctiva/sclera: Conjunctivae normal.     Pupils: Pupils are equal, round, and reactive to light.  Neck:     Thyroid: No thyromegaly.  Cardiovascular:     Rate and Rhythm: Normal rate and regular rhythm.     Pulses: Normal pulses.     Heart sounds: Normal heart sounds. No murmur heard.    No friction rub.  Pulmonary:     Effort: Pulmonary effort is normal.     Breath sounds: Normal breath sounds. No wheezing, rhonchi or rales.  Abdominal:     General: Bowel sounds are normal.     Palpations: Abdomen is soft.     Tenderness: There is no abdominal tenderness.  Musculoskeletal:        General: No deformity. Normal range of motion.  Lymphadenopathy:     Cervical: No cervical adenopathy.  Skin:    General: Skin is warm and dry.     Findings: No lesion.  Neurological:     General: No focal deficit present.     Mental Status: She is alert and  oriented to person, place, and time.  Psychiatric:        Mood and Affect: Mood normal.        Thought Content: Thought content normal.       06/22/2023    8:44 AM 05/12/2023   10:42 AM 04/13/2023    9:42 AM 07/21/2022   11:06 AM 06/05/2022   10:03 AM  Depression screen PHQ 2/9  Decreased Interest 1 1 1 1 1   Down, Depressed, Hopeless 1 1 1 1 1   PHQ - 2 Score 2 2 2 2 2   Altered sleeping 3 3 2 2 2   Tired, decreased energy 2 2 2 2 3   Change in appetite 1 1 1 1 1   Feeling bad or failure about yourself  1 1 2 1 1   Trouble concentrating 1 1 1 1 1   Moving slowly or fidgety/restless 1 0 1 2 1   Suicidal thoughts 0 0 0 0 0  PHQ-9 Score 11 10 11 11 11   Difficult doing work/chores Somewhat difficult  Somewhat difficult Somewhat difficult Very difficult       06/22/2023    8:45 AM 05/12/2023   10:43 AM 04/13/2023    9:43 AM 06/05/2022   10:38 AM  GAD 7 : Generalized Anxiety Score  Nervous, Anxious, on Edge 3 3 3 3   Control/stop worrying 2 3 3 3   Worry too much - different things 3 3 3 3   Trouble relaxing 2 2 3 2   Restless 1 1 2 1   Easily annoyed or irritable 1 1 2 1   Afraid - awful might happen 1 2 3 3   Total GAD 7 Score 13 15 19 16   Anxiety Difficulty Very difficult  Very difficult Extremely difficult      No results found for any visits on 06/22/23.    Assessment & Plan:  Well adult exam -     Comprehensive metabolic panel -     Hemoglobin A1c  Class 3 severe obesity due to excess calories with serious comorbidity and body mass index (BMI) of 40.0 to 44.9 in adult (HCC) -Body mass index is 47.29 kg/m. -     Comprehensive metabolic panel -     Hemoglobin A1c -     VITAMIN D 25 Hydroxy (Vit-D Deficiency, Fractures)  Vitamin D deficiency -     VITAMIN D 25 Hydroxy (Vit-D Deficiency, Fractures)  Age-appropriate health screenings discussed.  Will obtain lab.  Some labs done few months ago will not be repeated including cholesterol.  Immunizations reviewed.  Next CPE in 1  year.  Rechecking vitamin D given history of vitamin D deficiency.  Lifestyle modifications.  Return if symptoms worsen or fail to improve.   Deeann Saint, MD

## 2023-06-24 ENCOUNTER — Ambulatory Visit (INDEPENDENT_AMBULATORY_CARE_PROVIDER_SITE_OTHER): Payer: 59

## 2023-06-24 DIAGNOSIS — J309 Allergic rhinitis, unspecified: Secondary | ICD-10-CM | POA: Diagnosis not present

## 2023-07-01 ENCOUNTER — Ambulatory Visit (INDEPENDENT_AMBULATORY_CARE_PROVIDER_SITE_OTHER): Payer: 59

## 2023-07-01 DIAGNOSIS — J309 Allergic rhinitis, unspecified: Secondary | ICD-10-CM

## 2023-07-02 ENCOUNTER — Encounter: Payer: Self-pay | Admitting: Adult Health

## 2023-07-02 ENCOUNTER — Ambulatory Visit (INDEPENDENT_AMBULATORY_CARE_PROVIDER_SITE_OTHER): Payer: 59 | Admitting: Adult Health

## 2023-07-02 ENCOUNTER — Other Ambulatory Visit (HOSPITAL_BASED_OUTPATIENT_CLINIC_OR_DEPARTMENT_OTHER): Payer: Self-pay

## 2023-07-02 ENCOUNTER — Ambulatory Visit (INDEPENDENT_AMBULATORY_CARE_PROVIDER_SITE_OTHER): Payer: 59 | Admitting: Psychiatry

## 2023-07-02 DIAGNOSIS — F411 Generalized anxiety disorder: Secondary | ICD-10-CM

## 2023-07-02 DIAGNOSIS — F331 Major depressive disorder, recurrent, moderate: Secondary | ICD-10-CM | POA: Diagnosis not present

## 2023-07-02 DIAGNOSIS — F410 Panic disorder [episodic paroxysmal anxiety] without agoraphobia: Secondary | ICD-10-CM

## 2023-07-02 DIAGNOSIS — G4700 Insomnia, unspecified: Secondary | ICD-10-CM | POA: Diagnosis not present

## 2023-07-02 MED ORDER — HYDROXYZINE HCL 25 MG PO TABS
25.0000 mg | ORAL_TABLET | Freq: Three times a day (TID) | ORAL | 3 refills | Status: AC | PRN
  Filled 2023-07-02: qty 270, 90d supply, fill #0
  Filled 2024-06-20: qty 270, 90d supply, fill #1

## 2023-07-02 MED ORDER — PROPRANOLOL HCL 10 MG PO TABS
10.0000 mg | ORAL_TABLET | Freq: Three times a day (TID) | ORAL | 3 refills | Status: AC
  Filled 2023-07-02: qty 270, 90d supply, fill #0
  Filled 2024-06-20: qty 270, 90d supply, fill #1

## 2023-07-02 MED ORDER — BUPROPION HCL ER (XL) 150 MG PO TB24
450.0000 mg | ORAL_TABLET | Freq: Every morning | ORAL | 3 refills | Status: AC
Start: 2023-07-02 — End: ?
  Filled 2023-07-02: qty 270, 90d supply, fill #0

## 2023-07-02 MED ORDER — ESCITALOPRAM OXALATE 20 MG PO TABS
20.0000 mg | ORAL_TABLET | Freq: Every day | ORAL | 3 refills | Status: AC
Start: 2023-07-02 — End: ?
  Filled 2023-07-02: qty 90, 90d supply, fill #0
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
## 2023-07-02 NOTE — Progress Notes (Signed)
Brianna Barr [id number] Dec 15, 1995 27 y.o.  Subjective:   Patient ID:  Brianna Barr is a 27 y.o. (DOB [birth year]) female.  Chief Complaint: No chief complaint on file.   HPI Brianna Barr presents to the office today for follow-up of MDD, GAD, panic attacks, and insomnia.  Describes mood today as "ok". Pleasant. Reports tearfulness at times - 4 to 5 days a week. Mood symptoms - reports decreased depression - very minimal. Reports anxiety and irritability. Reports panic attacks - 2 to 3 times a week.Reports worry, rumination and over thinking. Mood is lower. Stating "I don't feel like I'm doing too good". Reports not taking medications except occasional Propranolol and hydroxyzine. Would like to restart Lexapro and Wellbutrin. Reports varying interest and motivation. Seeing therapist bi-weekly - Rockne Menghini. Energy levels "getting better". Reports a chronic low Vitamin D level. Active, does not have a regular exercise routine.   Enjoys some usual interests and activities. Single. Lives alone. Parents and 2 younger sisters local. Spending time with family. Has a core group of 3 friends. Appetite decreased. Weight stable. Sleeps better some nights than others. Averages 4 to 5 hours a night when working and 6 to 7 hours when off. Reports napping. Focus and concentration "ok" Completing tasks. Managing aspects of household. 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 Denies SI or HI.  Denies AH or VH. Denies self harm. Denies substance use.  Previous medication trials: Zoloft in college, Viibryd, Wellbutrin   GAD-7    Flowsheet Row Office Visit from 06/22/2023 in [facility] [employer] from 05/12/2023 in [facility] Office Visit from 04[facility]13/2023 in [facility] [place] Office Visit from 06/05/2022 in [facility] [place] Office Visit from 06/25/2020 in [facility] [place] [facility] [place]  Total GAD-7 Score 13 15 19 16 18       PHQ2-9    Flowsheet Row Office Visit from 06/22/2023 [facility] [place] Office Visit from 05/12/2023 in [facility] Visit from 04/13/2023 in [facility] [place] Office Visit from 07/21/2022 in [facility] [place] Office Visit from 06[facility]05/2022 in [facility] [place]  PHQ-2 Total Score 2 2 2 2 2   PHQ-9 Total Score 11 10 11 11 11       Flowsheet Row ED from 06/06/2021 in [facility]) ED from 03/14/2021 in [facility] RISK CATEGORY No Risk No Risk        Review of Systems:  Review of Systems  Musculoskeletal:  Negative for gait problem.  Neurological:  Negative for tremors.  Psychiatric/Behavioral:         Please refer to HPI    Medications: I have reviewed the patient's current medications.  Current Outpatient Medications  Medication Sig Dispense Refill   albuterol (VENTOLIN HFA) 108 (90 Base) MCG/ACT inhaler Inhale 2 puffs by mouth into the lungs every 6 (six) hours as needed for wheezing or shortness of breath. 6.7 g 4   buPROPion (WELLBUTRIN XL) 150 MG 24 hr tablet Take 3 tablets (450 mg total) by mouth every morning. 270 tablet 3   clindamycin (CLEOCIN) 150 MG capsule Take 1 capsule (150 mg total) by mouth 3 (three) times daily until gone (Patient not taking: Reported on 06/22/2023) 21 capsule 0  EPINEPHrine 0.3 mg/0.3 mL IJ SOAJ injection Inject 0.3 mg into the muscle as needed for anaphylaxis. 2 each 1   hydrOXYzine (ATARAX) 25 MG tablet Take 1 tablet (25 mg total) by mouth 3 (three) times daily as needed. 270 tablet 3   levocetirizine (XYZAL) 5 MG tablet Take 1 tablet (5 mg total) by mouth every evening. 32 tablet 5   linaclotide (LINZESS) 145 MCG CAPS capsule Take 1 capsule (145  mcg total) by mouth daily before breakfast. 90 capsule 3   montelukast (SINGULAIR) 10 MG tablet Take 1 tablet (10 mg total) by mouth at bedtime. 90 tablet 2   Olopatadine HCl 0.2 % SOLN Place 1 drop into both eyes daily as needed. 2.5 mL 5   Olopatadine-Mometasone (RYALTRIS) 665-25 MCG/ACT SUSP Place 2 sprays into the nose 2 (two) times daily. 29 g 5   predniSONE (DELTASONE) 10 MG tablet Take 1 tablet (10 mg total) by mouth daily with breakfast. 30 tablet 0   predniSONE (DELTASONE) 20 MG tablet Take 2 tabs the day before RUSH appt in the morning and the day off appt. (Patient not taking: Reported on 06/22/2023) 4 tablet 0   propranolol (INDERAL) 10 MG tablet Take 1 tablet (10 mg total) by mouth 3 (three) times daily. 270 tablet 3   No current facility-administered medications for this visit.    Medication Side Effects: None  Allergies:  Allergies  Allergen Reactions   Covid-19 (Mrna Bivalent) Vaccine Proofreader) [Covid-19 (Mrna) Vaccine] Other (See Comments)    Arm pain, stiffness in arm and neck, redness and swelling of arm, severe itching    Past Medical History:  Diagnosis Date   Anxiety    Asthma    Depression    Overactive bladder    Stress incontinence     Past Medical History, Surgical history, Social history, and Family history were reviewed and updated as appropriate.   Please see review of systems for further details on the patient's review from today.   Objective:   Physical Exam:  There were no vitals taken for this visit.  Physical Exam Constitutional:      General: She is not in acute distress. Musculoskeletal:        General: No deformity.  Neurological:     Mental Status: She is alert and oriented to person, place, and time.     Coordination: Coordination normal.  Psychiatric:        Attention and Perception: Attention and perception normal. She does not perceive auditory or visual hallucinations.        Mood and Affect: Affect is not labile, blunt, angry  or inappropriate.        Speech: Speech normal.        Behavior: Behavior normal.        Thought Content: Thought content normal. Thought content is not paranoid or delusional. Thought content does not include homicidal or suicidal ideation. Thought content does not include homicidal or suicidal plan.        Cognition and Memory: Cognition and memory normal.        Judgment: Judgment normal.     Comments: Insight intact     Lab Review:     Component Value Date/Time   NA 140 06/22/2023 0845   K 4.2 06/22/2023 0845   CL 106 06/22/2023 0845   CO2 25 06/22/2023 0845   GLUCOSE 90 06/22/2023 0845   BUN 11 06/22/2023 0845   CREATININE 0.87 06/22/2023 0845   CALCIUM 8.9 06/22/2023 0845  PROT 7.0 06/22/2023 0845   ALBUMIN 3.8 06/22/2023 0845   AST 18 06/22/2023 0845   ALT 17 06/22/2023 0845   ALKPHOS 92 06/22/2023 0845   BILITOT 0.2 06/22/2023 0845       Component Value Date/Time   WBC 8.9 04/14/2023 1043   RBC 5.09 04/14/2023 1043   HGB 13.6 04/14/2023 1043   HCT 41.6 04/14/2023 1043   PLT 378.0 04/14/2023 1043   MCV 81.7 04/14/2023 1043   MCH 23.4 (L) 10/03/2020 1146   MCHC 32.6 04/14/2023 1043   RDW 13.9 04/14/2023 1043   LYMPHSABS 2.2 04/14/2023 1043   MONOABS 0.5 04/14/2023 1043   EOSABS 0.1 04/14/2023 1043   BASOSABS 0.0 04/14/2023 1043    No results found for: "POCLITH", "LITHIUM"   No results found for: "PHENYTOIN", "PHENOBARB", "VALPROATE", "CBMZ"   .res Assessment: Plan:    Plan:  PDMP reviewed  Propranolol 10mg  TID Hydroxyzine 25mg  - 1 to 2 at bedtime. Wellbutrin XL 450mg  every morning. Denies seizure history. Lexapro 20mg  daily  D/C Buspar - did not tolerate  Patient advised to contact office with any questions, adverse effects, or acute worsening in signs and symptoms.   RTC 4 weeks  There are no diagnoses linked to this encounter.   Please see After Visit Summary for patient specific instructions.  Future Appointments  Date Time Provider  Department Center  07/02/2023  9:00 AM Keionna Kinnaird, Thereasa Solo, NP [facility] None  07/02/2023 10:00 AM Mathis Fare, LCSW [facility] None  07/13/2023 11:00 AM Mathis Fare, LCSW [facility] None  07/28/2023  9:00 AM Mathis Fare, LCSW [facility] None  08/11/2023  9:00 AM Mathis Fare, LCSW [facility] None  08/20/2023 10:15 AM Verlee Monte, MD [facility] None    No orders of the defined types were placed in this encounter.   -------------------------------

## 2023-07-06 ENCOUNTER — Ambulatory Visit (INDEPENDENT_AMBULATORY_CARE_PROVIDER_SITE_OTHER): Payer: 59

## 2023-07-06 DIAGNOSIS — J309 Allergic rhinitis, unspecified: Secondary | ICD-10-CM

## 2023-07-13 ENCOUNTER — Ambulatory Visit (INDEPENDENT_AMBULATORY_CARE_PROVIDER_SITE_OTHER): Payer: 59 | Admitting: Psychiatry

## 2023-07-13 DIAGNOSIS — F411 Generalized anxiety disorder: Secondary | ICD-10-CM | POA: Diagnosis not present
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
## 2023-07-14 ENCOUNTER — Ambulatory Visit (INDEPENDENT_AMBULATORY_CARE_PROVIDER_SITE_OTHER): Payer: Self-pay

## 2023-07-14 DIAGNOSIS — J309 Allergic rhinitis, unspecified: Secondary | ICD-10-CM

## 2023-07-21 ENCOUNTER — Ambulatory Visit (INDEPENDENT_AMBULATORY_CARE_PROVIDER_SITE_OTHER): Payer: Self-pay

## 2023-07-21 DIAGNOSIS — J309 Allergic rhinitis, unspecified: Secondary | ICD-10-CM

## 2023-07-28 ENCOUNTER — Ambulatory Visit: Payer: 59 | Admitting: Adult Health

## 2023-07-28 ENCOUNTER — Ambulatory Visit: Payer: 59 | Admitting: Psychiatry

## 2023-07-29 ENCOUNTER — Ambulatory Visit (INDEPENDENT_AMBULATORY_CARE_PROVIDER_SITE_OTHER): Payer: Self-pay

## 2023-07-29 DIAGNOSIS — J309 Allergic rhinitis, unspecified: Secondary | ICD-10-CM | POA: Diagnosis not present

## 2023-08-04 ENCOUNTER — Ambulatory Visit (INDEPENDENT_AMBULATORY_CARE_PROVIDER_SITE_OTHER): Payer: 59

## 2023-08-04 DIAGNOSIS — J309 Allergic rhinitis, unspecified: Secondary | ICD-10-CM

## 2023-08-11 ENCOUNTER — Ambulatory Visit (INDEPENDENT_AMBULATORY_CARE_PROVIDER_SITE_OTHER): Payer: 59 | Admitting: Psychiatry

## 2023-08-11 DIAGNOSIS — J3089 Other allergic rhinitis: Secondary | ICD-10-CM | POA: Diagnosis not present

## 2023-08-11 DIAGNOSIS — F411 Generalized anxiety disorder: Secondary | ICD-10-CM

## 2023-08-11 NOTE — Progress Notes (Signed)
VIALS EXP 08-10-24
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
## 2023-08-13 ENCOUNTER — Other Ambulatory Visit (HOSPITAL_BASED_OUTPATIENT_CLINIC_OR_DEPARTMENT_OTHER): Payer: Self-pay

## 2023-08-13 ENCOUNTER — Ambulatory Visit (INDEPENDENT_AMBULATORY_CARE_PROVIDER_SITE_OTHER): Payer: 59

## 2023-08-13 DIAGNOSIS — J309 Allergic rhinitis, unspecified: Secondary | ICD-10-CM

## 2023-08-13 MED ORDER — FLULAVAL 0.5 ML IM SUSY
0.5000 mL | PREFILLED_SYRINGE | Freq: Once | INTRAMUSCULAR | 0 refills | Status: AC
  Filled 2023-08-13: qty 0.5, 1d supply, fill #0

## 2023-08-18 ENCOUNTER — Other Ambulatory Visit (HOSPITAL_BASED_OUTPATIENT_CLINIC_OR_DEPARTMENT_OTHER): Payer: Self-pay

## 2023-08-19 NOTE — Progress Notes (Unsigned)
FOLLOW UP Date of Service/Encounter:  08/20/23  Subjective:  Brianna Barr (DOB: 15-Feb-1996) is a 27 y.o. female who [facility] in re-evaluation of the following: allergic rhinitis, asthma History obtained from: chart review and patient.  For Review, LV was on 04/24/23  with Dr.Mikiah Durall seen for  [facility] immunotherapy . See below for summary of history and diagnostics.   ----------------------------------------------------- Pertinent History/Diagnostics:  Asthma: Triggered by allergies. Takes singulair. - nonobstructive patter spirometry (02/16/23)-FEV1 82% Allergic Rhinitis:  sneezing, runny nose, itchy eyes. Symptoms are year-round but worse in the spring and fall  Current meds: allegra or xyzal, pataday PRN, ryaltris 2 SEN BID PRN AIT RUSH started 04/24/23 - SPT environmental panel (02/16/23): positive to grasses, ragweed, weeds, trees, indoor molds, outdoor molds, dust mites, and cat.  --------------------------------------------------- Today presents for follow-up. Discussed the use of AI scribe software for clinical note transcription with the patient, who gave verbal consent to proceed.  History of Present Illness   The patient, currently undergoing allergy injections, reports a slight improvement in symptoms but is uncertain if this is due to the injections or her daily medications. She is currently on Singulair, Allegra, and Pataday eye drops, but has discontinued the use of a prescribed nasal spray (Ryaltris) due to discomfort during administration and perceived lack of efficacy.  The patient experienced a respiratory infection approximately a month ago, which resulted in shortness of breath and necessitated the use of an albuterol inhaler. However, she reports that her breathing has since returned to normal.     All medications reviewed by clinical staff and updated in chart. No new pertinent medical or surgical history except  as noted in HPI.  ROS: All others negative except as noted per HPI.   Objective:  BP 118/72   Pulse 78   Temp (!) 97.5 F (36.4 C) (Temporal)   Resp 17   Wt 264 lb 3.2 oz (119.8 kg)   SpO2 98%   BMI 48.62 kg/m  Body mass index is 48.62 kg/m. Physical Exam: General Appearance:  Alert, cooperative, no distress, appears stated age  Head:  Normocephalic, without obvious abnormality, atraumatic  Eyes:  Conjunctiva clear, EOM's intact  Ears EACs normal bilaterally and normal TMs bilaterally  Nose: Nares normal, hypertrophic turbinates, normal mucosa, no visible anterior polyps, and septum midline  Throat: Lips, tongue normal; teeth and gums normal, normal posterior oropharynx  Neck: Supple, symmetrical  Lungs:   clear to auscultation bilaterally, Respirations unlabored, no coughing  Heart:  regular rate and rhythm and no murmur, Appears well perfused  Extremities: No edema  Skin: Skin color, texture, turgor normal and no rashes or lesions on visualized portions of skin  Neurologic: No gross deficits   Labs:  Lab Orders  No laboratory test(s) ordered today    Spirometry:  Tracings reviewed. Her effort: Good reproducible efforts. FVC: 2.71L FEV1: 2.28L, 87% predicted FEV1/FVC ratio: 0.84 Interpretation: Spirometry consistent with normal pattern.  Please see scanned spirometry results for details.  Assessment/Plan   Allergic rhinitis: improved on AIT - Allergen avoidance towards grasses, ragweed, weeds, trees, indoor molds, outdoor molds, dust mites, and cat. Singulair (montelukast) 10mg  daily.  Best to take at bedtime.  Pataday (olopatadine) one drop per eye daily as needed for itchy/watery eyes. Allegra or Xyzal 1 tab daily.  These are long-acting antihistamines that are over-the-counter options.  Ryaltris 2 sprays each nostril twice a day as needed for runny or stuffy nose.  Continue allergy  injections per protocol. Have access to epinephrine autoinjector.   Asthma: at  goal Spirometry look great today. - Daily controller medication(s): Singulair as above - Prior to physical activity: albuterol 2 puffs 10-15 minutes before physical activity. - Rescue medications: albuterol 2 puffs every 4-6 hours as needed  - Asthma control goals:  * Full participation in all desired activities (may need albuterol before activity) * Albuterol use two time or less a week on average (not counting use with activity) * Cough interfering with sleep two time or less a month * Oral steroids no more than once a year * No hospitalizations    Follow-up in 6 months or sooner if needed It was a pleasure seeing you again in clinic today! Thank you for allowing me to participate in your care.  Other: allergy injection received in clinic today.  Tonny Bollman, MD  [facility]

## 2023-08-20 ENCOUNTER — Ambulatory Visit: Payer: 59 | Admitting: Internal Medicine

## 2023-08-20 ENCOUNTER — Encounter: Payer: Self-pay | Admitting: Internal Medicine

## 2023-08-20 ENCOUNTER — Other Ambulatory Visit (HOSPITAL_BASED_OUTPATIENT_CLINIC_OR_DEPARTMENT_OTHER): Payer: Self-pay

## 2023-08-20 VITALS — BP 118/72 | HR 78 | Temp 97.5°F | Resp 17 | Wt 264.2 lb

## 2023-08-20 DIAGNOSIS — J302 Other seasonal allergic rhinitis: Secondary | ICD-10-CM | POA: Diagnosis not present

## 2023-08-20 DIAGNOSIS — H1013 Acute atopic conjunctivitis, bilateral: Secondary | ICD-10-CM | POA: Diagnosis not present

## 2023-08-20 DIAGNOSIS — J4520 Mild intermittent asthma, uncomplicated: Secondary | ICD-10-CM | POA: Diagnosis not present

## 2023-08-20 DIAGNOSIS — J3089 Other allergic rhinitis: Secondary | ICD-10-CM | POA: Diagnosis not present

## 2023-08-20 DIAGNOSIS — J309 Allergic rhinitis, unspecified: Secondary | ICD-10-CM | POA: Insufficient documentation

## 2023-08-20 MED ORDER — RYALTRIS 665-25 MCG/ACT NA SUSP: 2.0000 | Freq: Two times a day (BID) | NASAL | 5 refills | Status: AC

## 2023-08-20 MED ORDER — ALBUTEROL SULFATE HFA 108 (90 BASE) MCG/ACT IN AERS
2.0000 | INHALATION_SPRAY | Freq: Four times a day (QID) | RESPIRATORY_TRACT | 4 refills | Status: DC | PRN
  Filled 2023-08-20: qty 6.7, 25d supply, fill #0

## 2023-08-20 MED ORDER — OLOPATADINE HCL 0.2 % OP SOLN
1.0000 [drp] | Freq: Every day | OPHTHALMIC | 5 refills | Status: AC | PRN
  Filled 2023-08-20 – 2024-06-20 (×2): qty 2.5, 50d supply, fill #0

## 2023-08-20 MED ORDER — LEVOCETIRIZINE DIHYDROCHLORIDE 5 MG PO TABS
5.0000 mg | ORAL_TABLET | Freq: Every evening | ORAL | 5 refills | Status: AC
  Filled 2023-08-20 – 2024-06-20 (×2): qty 32, 32d supply, fill #0

## 2023-08-20 MED ORDER — MONTELUKAST SODIUM 10 MG PO TABS
10.0000 mg | ORAL_TABLET | Freq: Every day | ORAL | 2 refills | Status: DC
  Filled 2023-08-20: qty 90, 90d supply, fill #0

## 2023-08-20 NOTE — Patient Instructions (Addendum)
Allergic rhinitis: - Allergen avoidance towards grasses, ragweed, weeds, trees, indoor molds, outdoor molds, dust mites, and cat. Singulair (montelukast) 10mg  daily.  Best to take at bedtime.  Pataday (olopatadine) one drop per eye daily as needed for itchy/watery eyes. Allegra or Xyzal 1 tab daily.  These are long-acting antihistamines that are over-the-counter options.  Ryaltris 2 sprays each nostril twice a day as needed for runny or stuffy nose.  Continue allergy injections per protocol. Have access to epinephrine autoinjector.   Asthma:  - Daily controller medication(s): Singulair as above - Prior to physical activity: albuterol 2 puffs 10-15 minutes before physical activity. - Rescue medications: albuterol 2 puffs every 4-6 hours as needed  - Asthma control goals:  * Full participation in all desired activities (may need albuterol before activity) * Albuterol use two time or less a week on average (not counting use with activity) * Cough interfering with sleep two time or less a month * Oral steroids no more than once a year * No hospitalizations    Follow-up in 6 months or sooner if needed It was a pleasure seeing you again in clinic today! Thank you for allowing me to participate in your care.  Tonny Bollman, MD [facility]

## 2023-08-25 ENCOUNTER — Ambulatory Visit (INDEPENDENT_AMBULATORY_CARE_PROVIDER_SITE_OTHER): Payer: 59 | Admitting: Psychiatry

## 2023-08-25 DIAGNOSIS — F411 Generalized anxiety disorder: Secondary | ICD-10-CM
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
## 2023-08-26 ENCOUNTER — Ambulatory Visit (INDEPENDENT_AMBULATORY_CARE_PROVIDER_SITE_OTHER): Payer: Self-pay

## 2023-08-26 DIAGNOSIS — J309 Allergic rhinitis, unspecified: Secondary | ICD-10-CM

## 2023-09-01 ENCOUNTER — Other Ambulatory Visit (HOSPITAL_BASED_OUTPATIENT_CLINIC_OR_DEPARTMENT_OTHER): Payer: Self-pay

## 2023-09-02 ENCOUNTER — Other Ambulatory Visit (HOSPITAL_COMMUNITY)
Admission: RE | Admit: 2023-09-02 | Discharge: 2023-09-02 | Disposition: A | Discharge status: Home / Self Care | Payer: 59 | Source: Ambulatory Visit | Attending: Oncology | Admitting: Oncology

## 2023-09-02 DIAGNOSIS — Z006 Encounter for examination for normal comparison and control in clinical research program: Secondary | ICD-10-CM | POA: Insufficient documentation

## 2023-09-04 ENCOUNTER — Ambulatory Visit (INDEPENDENT_AMBULATORY_CARE_PROVIDER_SITE_OTHER): Payer: 59 | Admitting: *Deleted

## 2023-09-04 DIAGNOSIS — J309 Allergic rhinitis, unspecified: Secondary | ICD-10-CM | POA: Diagnosis not present

## 2023-09-07 ENCOUNTER — Ambulatory Visit
Admission: RE | Admit: 2023-09-07 | Discharge: 2023-09-07 | Disposition: A | Discharge status: Home / Self Care | Payer: 59 | Source: Ambulatory Visit | Attending: Family Medicine | Admitting: Family Medicine

## 2023-09-07 ENCOUNTER — Other Ambulatory Visit (HOSPITAL_BASED_OUTPATIENT_CLINIC_OR_DEPARTMENT_OTHER): Payer: Self-pay

## 2023-09-07 VITALS — BP 166/82 | HR 110 | Temp 98.5°F | Resp 18

## 2023-09-07 DIAGNOSIS — J4520 Mild intermittent asthma, uncomplicated: Secondary | ICD-10-CM

## 2023-09-07 DIAGNOSIS — J039 Acute tonsillitis, unspecified: Secondary | ICD-10-CM

## 2023-09-07 DIAGNOSIS — R059 Cough, unspecified: Secondary | ICD-10-CM | POA: Diagnosis not present

## 2023-09-07 DIAGNOSIS — R0602 Shortness of breath: Secondary | ICD-10-CM

## 2023-09-07 DIAGNOSIS — K122 Cellulitis and abscess of mouth: Secondary | ICD-10-CM

## 2023-09-07 LAB — POCT RAPID STREP A (OFFICE): Rapid Strep A Screen: NEGATIVE

## 2023-09-07 MED ORDER — PREDNISONE 20 MG PO TABS
60.0000 mg | ORAL_TABLET | Freq: Every day | ORAL | 0 refills | Status: DC
  Filled 2023-09-07: qty 15, 5d supply, fill #0

## 2023-09-07 MED ORDER — ALBUTEROL SULFATE HFA 108 (90 BASE) MCG/ACT IN AERS
2.0000 | INHALATION_SPRAY | Freq: Four times a day (QID) | RESPIRATORY_TRACT | 0 refills | Status: AC | PRN
  Filled 2023-09-07: qty 6.7, 25d supply, fill #0

## 2023-09-07 MED ORDER — AMOXICILLIN 875 MG PO TABS
875.0000 mg | ORAL_TABLET | Freq: Two times a day (BID) | ORAL | 0 refills | Status: AC
  Filled 2023-09-07: qty 14, 7d supply, fill #0

## 2023-09-07 NOTE — Discharge Instructions (Addendum)
Advised patient to take medication as directed with food to completion.  Advised patient to take prednisone with first dose of amoxicillin for the next 5 of 7 days.  Advised may use albuterol inhaler for shortness of breath.  Encouraged increased daily water intake to 64 ounces per day while taking these medications.  Advised if symptoms worsen and/or unresolved please follow-up with PCP or here for further evaluation.

## 2023-09-07 NOTE — ED Triage Notes (Signed)
Pt presents to [facility] with co of sore throat since yesterday, concerned for strep. Pt has been taking nyquill at night.

## 2023-09-07 NOTE — ED Provider Notes (Signed)
[facility]    CSN: [id number] Arrival date & time: 09/07/23  1025      History   Chief Complaint Chief Complaint  Patient presents with   URI    Upper respiratory infection with sore throat - Entered by patient    HPI Brianna Barr is a 27 y.o. female.   HPI 27 year old female presents with chest congestion and sore throat for 2-3 days.  Patient is concerned with possible upper respiratory infection.  PMH significant for morbid obesity, overactive bladder, and asthma (mild intermittent, uncomplicated)  Past Medical History:  Diagnosis Date   Anxiety    Asthma    Depression    Overactive bladder    Stress incontinence     Patient Active Problem List   Diagnosis Date Noted   Allergic rhinitis due to allergen 08/20/2023   Allergic conjunctivitis of both eyes 08/20/2023   Anxiety and depression 07/07/2019   Mild intermittent asthma, uncomplicated 07/07/2019   Seasonal allergies 07/07/2019   Plantar fasciitis 07/07/2019   Hyperhidrosis 07/07/2019   OAB (overactive bladder) 07/07/2019   Allergy 08/06/2018    Past Surgical History:  Procedure Laterality Date   WISDOM TOOTH EXTRACTION      OB History     Gravida  0   Para  0   Term  0   Preterm  0   AB  0   Living  0      SAB  0   IAB  0   Ectopic  0   Multiple  0   Live Births  0            Home Medications    Prior to Admission medications   Medication Sig Start Date End Date Taking? Authorizing Provider  amoxicillin (AMOXIL) 875 MG tablet Take 1 tablet (875 mg total) by mouth 2 (two) times daily for 7 days. 09/07/23 09/14/23 Yes Trevor Iha, FNP  predniSONE (DELTASONE) 20 MG tablet Take 3 tablets (60 mg total) by mouth daily for 5 days 09/07/23  Yes Trevor Iha, FNP  albuterol (VENTOLIN HFA) 108 (90 Base) MCG/ACT inhaler Inhale 2 puffs by mouth into the lungs every 6 (six) hours as needed for wheezing or shortness of breath. 09/07/23   Trevor Iha, FNP   buPROPion (WELLBUTRIN XL) 150 MG 24 hr tablet Take 3 tablets (450 mg total) by mouth every morning. 07/02/23   Mozingo, Thereasa Solo, NP  EPINEPHrine 0.3 mg/0.3 mL IJ SOAJ injection Inject 0.3 mg into the muscle as needed for anaphylaxis. 02/16/23   Marcelyn Bruins, MD  escitalopram (LEXAPRO) 20 MG tablet Take 1 tablet (20 mg total) by mouth daily. 07/02/23   Mozingo, Thereasa Solo, NP  hydrOXYzine (ATARAX) 25 MG tablet Take 1 tablet (25 mg total) by mouth 3 (three) times daily as needed. 07/02/23   Mozingo, Thereasa Solo, NP  levocetirizine (XYZAL) 5 MG tablet Take 1 tablet (5 mg total) by mouth every evening. 08/20/23   Verlee Monte, MD  linaclotide [facility]) 145 MCG CAPS capsule Take 1 capsule (145 mcg total) by mouth daily before breakfast. 04/04/21   Hilarie Fredrickson, MD  montelukast (SINGULAIR) 10 MG tablet Take 1 tablet (10 mg total) by mouth at bedtime. 08/20/23   Verlee Monte, MD  Olopatadine HCl 0.2 % SOLN Place 1 drop into both eyes daily as needed. 08/20/23   Verlee Monte, MD  Olopatadine-Mometasone [facility]) [phone] MCG/ACT SUSP Place 2 sprays into the nose 2 (two) times daily.  08/20/23   Verlee Monte, MD  propranolol (INDERAL) 10 MG tablet Take 1 tablet (10 mg total) by mouth 3 (three) times daily. 07/02/23   Mozingo, Thereasa Solo, NP    Family History Family History  Problem Relation Age of Onset   Multiple sclerosis Mother    Hypertension Father    Hypertension Maternal Grandmother    Diabetes Maternal Grandmother    Heart disease Maternal Grandmother    Cervical cancer Maternal Grandmother    Cirrhosis Maternal Grandfather    Stroke Paternal Grandmother    Heart disease Paternal Grandmother    Hypertension Paternal Grandmother    Diabetes Paternal Grandmother    Endometrial cancer Paternal Grandmother    Stroke Paternal Grandfather    Colon cancer Neg Hx    Colon polyps Neg Hx    Pancreatic cancer Neg Hx    Esophageal cancer Neg Hx    Liver disease Neg  Hx    Stomach cancer Neg Hx     Social History Social History   Tobacco Use   Smoking status: Never   Smokeless tobacco: Never  Vaping Use   Vaping status: Never Used  Substance Use Topics   Alcohol use: No   Drug use: No     Allergies   Covid-19 (mrna bivalent) vaccine (pfizer) [covid-19 (mrna) vaccine]   Review of Systems Review of Systems  HENT:  Positive for sore throat.   All other systems reviewed and are negative.    Physical Exam Triage Vital Signs ED Triage Vitals  Encounter Vitals Group     BP      Systolic BP Percentile      Diastolic BP Percentile      Pulse      Resp      Temp      Temp src      SpO2      Weight      Height      Head Circumference      Peak Flow      Pain Score      Pain Loc      Pain Education      Exclude from Growth Chart    No data found.  Updated Vital Signs BP (!) 166/82   Pulse (!) 110   Temp 98.5 F (36.9 C)   Resp 18   LMP  (LMP Unknown)   SpO2 98%    Physical Exam Vitals and nursing note reviewed.  Constitutional:      Appearance: Normal appearance. She is obese.  HENT:     Head: Normocephalic and atraumatic.     Right Ear: Tympanic membrane, ear canal and external ear normal.     Left Ear: Tympanic membrane, ear canal and external ear normal. There is no impacted cerumen.     Nose: Nose normal.     Mouth/Throat:     Mouth: Mucous membranes are moist.     Pharynx: Oropharynx is clear. Uvula midline. Posterior oropharyngeal erythema and uvula swelling present.     Tonsils: 4+ on the right. 4+ on the left.  Eyes:     Extraocular Movements: Extraocular movements intact.     Conjunctiva/sclera: Conjunctivae normal.     Pupils: Pupils are equal, round, and reactive to light.  Cardiovascular:     Rate and Rhythm: Normal rate and regular rhythm.     Pulses: Normal pulses.     Heart sounds: Normal heart sounds.  Pulmonary:     Effort: Pulmonary  effort is normal.     Breath sounds: Normal breath  sounds. No wheezing, rhonchi or rales.  Musculoskeletal:        General: Normal range of motion.     Cervical back: Normal range of motion and neck supple.  Skin:    General: Skin is warm and dry.  Neurological:     General: No focal deficit present.     Mental Status: She is alert and oriented to person, place, and time. Mental status is at baseline.  Psychiatric:        Mood and Affect: Mood normal.        Behavior: Behavior normal.        Thought Content: Thought content normal.      UC Treatments / Results  Labs (all labs ordered are listed, but only abnormal results are displayed) Labs Reviewed  CULTURE, GROUP A STREP [facility])  POCT RAPID STREP A (OFFICE)    EKG   Radiology No results found.  Procedures Procedures (including critical care time)  Medications Ordered in UC Medications - No data to display  Initial Impression / Assessment and Plan / UC Course  I have reviewed the triage vital signs and the nursing notes.  Pertinent labs & imaging results that were available during my care of the patient were reviewed by me and considered in my medical decision making (see chart for details).     MDM: 1.  Acute tonsillitis, unspecified etiology-rapid strep, negative, throat culture ordered Rx'd amoxicillin 875 mg tablet: Take 1 tablet twice daily x 7 days, Rx'd prednisone 20 mg tablet: Take 3 tabs daily x 5 days; 2.  Uvulitis-same as 1. Rx'd amoxicillin 875 mg tablet: Take 1 tablet twice daily x 7 days; 3.  Shortness of breath-Rx'd albuterol (Ventolin HFA 108/90 base) MCG/ACT inhaler: Inhale 2 puffs into lungs every 6 hours as needed for wheezing or shortness of breath refilled per patient request. Advised patient to take medication as directed with food to completion.  Advised patient to take prednisone with first dose of amoxicillin for the next 5 of 7 days.  Advised may use albuterol inhaler for shortness of breath.  Encouraged increased daily water intake to 64 ounces  per day while taking these medications.  Advised if symptoms worsen and/or unresolved please follow-up with PCP or here for further evaluation.  Work note provided to patient prior to discharge today. Final Clinical Impressions(s) / UC Diagnoses   Final diagnoses:  Acute tonsillitis, unspecified etiology  Uvulitis  Shortness of breath     Discharge Instructions      Advised patient to take medication as directed with food to completion.  Advised patient to take prednisone with first dose of amoxicillin for the next 5 of 7 days.  Advised may use albuterol inhaler for shortness of breath.  Encouraged increased daily water intake to 64 ounces per day while taking these medications.  Advised if symptoms worsen and/or unresolved please follow-up with PCP or here for further evaluation.     ED Prescriptions     Medication Sig Dispense Auth. Provider   albuterol (VENTOLIN HFA) 108 (90 Base) MCG/ACT inhaler Inhale 2 puffs by mouth into the lungs every 6 (six) hours as needed for wheezing or shortness of breath. 6.7 g Trevor Iha, FNP   amoxicillin (AMOXIL) 875 MG tablet Take 1 tablet (875 mg total) by mouth 2 (two) times daily for 7 days. 14 tablet Trevor Iha, FNP   predniSONE (DELTASONE) 20 MG tablet Take 3  tablets (60 mg total) by mouth daily for 5 days 15 tablet Trevor Iha, FNP      PDMP not reviewed this encounter.   Trevor Iha, FNP 09/07/23 1149

## 2023-09-08 ENCOUNTER — Ambulatory Visit: Payer: 59 | Admitting: Psychiatry

## 2023-09-09 LAB — CULTURE, GROUP A STREP (THRC)

## 2023-09-10 ENCOUNTER — Ambulatory Visit (INDEPENDENT_AMBULATORY_CARE_PROVIDER_SITE_OTHER): Payer: 59

## 2023-09-10 DIAGNOSIS — J309 Allergic rhinitis, unspecified: Secondary | ICD-10-CM | POA: Diagnosis not present

## 2023-09-14 ENCOUNTER — Ambulatory Visit (INDEPENDENT_AMBULATORY_CARE_PROVIDER_SITE_OTHER): Payer: 59

## 2023-09-14 DIAGNOSIS — J309 Allergic rhinitis, unspecified: Secondary | ICD-10-CM

## 2023-09-15 LAB — HELIX MOLECULAR SCREEN: Genetic Analysis Overall Interpretation: NEGATIVE

## 2023-09-15 LAB — GENECONNECT MOLECULAR SCREEN

## 2023-09-21 ENCOUNTER — Ambulatory Visit: Payer: 59 | Admitting: Psychiatry

## 2023-09-21 DIAGNOSIS — F411 Generalized anxiety disorder: Secondary | ICD-10-CM
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
## 2023-09-22 ENCOUNTER — Ambulatory Visit (INDEPENDENT_AMBULATORY_CARE_PROVIDER_SITE_OTHER): Payer: 59

## 2023-09-22 DIAGNOSIS — J309 Allergic rhinitis, unspecified: Secondary | ICD-10-CM

## 2023-09-29 ENCOUNTER — Ambulatory Visit (INDEPENDENT_AMBULATORY_CARE_PROVIDER_SITE_OTHER): Payer: 59

## 2023-09-29 DIAGNOSIS — J309 Allergic rhinitis, unspecified: Secondary | ICD-10-CM

## 2023-10-05 ENCOUNTER — Ambulatory Visit: Payer: 59 | Admitting: Psychiatry

## 2023-10-05 DIAGNOSIS — F411 Generalized anxiety disorder: Secondary | ICD-10-CM | POA: Diagnosis not present
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
## 2023-10-08 ENCOUNTER — Ambulatory Visit (INDEPENDENT_AMBULATORY_CARE_PROVIDER_SITE_OTHER): Payer: 59

## 2023-10-08 DIAGNOSIS — J309 Allergic rhinitis, unspecified: Secondary | ICD-10-CM | POA: Diagnosis not present

## 2023-10-13 ENCOUNTER — Ambulatory Visit (INDEPENDENT_AMBULATORY_CARE_PROVIDER_SITE_OTHER): Payer: Self-pay

## 2023-10-13 DIAGNOSIS — J309 Allergic rhinitis, unspecified: Secondary | ICD-10-CM | POA: Diagnosis not present

## 2023-10-23 ENCOUNTER — Ambulatory Visit (INDEPENDENT_AMBULATORY_CARE_PROVIDER_SITE_OTHER): Payer: 59 | Admitting: Psychiatry

## 2023-10-23 DIAGNOSIS — F411 Generalized anxiety disorder: Secondary | ICD-10-CM
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
## 2023-10-26 DIAGNOSIS — J3089 Other allergic rhinitis: Secondary | ICD-10-CM | POA: Diagnosis not present

## 2023-10-26 NOTE — Progress Notes (Signed)
VIALS EXP 10-25-24

## 2023-10-30 ENCOUNTER — Ambulatory Visit (INDEPENDENT_AMBULATORY_CARE_PROVIDER_SITE_OTHER): Payer: Self-pay

## 2023-10-30 DIAGNOSIS — J309 Allergic rhinitis, unspecified: Secondary | ICD-10-CM

## 2023-11-03 ENCOUNTER — Ambulatory Visit: Payer: Commercial Managed Care - PPO | Admitting: Psychiatry

## 2023-11-03 DIAGNOSIS — F411 Generalized anxiety disorder: Secondary | ICD-10-CM | POA: Diagnosis not present
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
## 2023-11-10 ENCOUNTER — Ambulatory Visit: Payer: Self-pay

## 2023-11-10 DIAGNOSIS — J309 Allergic rhinitis, unspecified: Secondary | ICD-10-CM

## 2023-11-17 ENCOUNTER — Ambulatory Visit: Payer: 59 | Admitting: Psychiatry

## 2023-11-17 DIAGNOSIS — F411 Generalized anxiety disorder: Secondary | ICD-10-CM
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
## 2023-11-27 ENCOUNTER — Ambulatory Visit (INDEPENDENT_AMBULATORY_CARE_PROVIDER_SITE_OTHER): Payer: 59

## 2023-11-27 DIAGNOSIS — J309 Allergic rhinitis, unspecified: Secondary | ICD-10-CM | POA: Diagnosis not present

## 2023-12-01 ENCOUNTER — Ambulatory Visit: Payer: 59 | Admitting: Psychiatry

## 2023-12-01 DIAGNOSIS — F411 Generalized anxiety disorder: Secondary | ICD-10-CM

## 2023-12-01 NOTE — Progress Notes (Signed)
 [facility] Counselor/Therapist Progress Note  Patient ID: Brianna Barr, MRN: [id number],    Date: 12/01/2023  Time Spent: 55 minutes   Treatment Type: Individual Therapy  Reported Symptoms: anxiety, depression, family stressors    Mental Status Exam:  Appearance:   Casual     Behavior:  Appropriate, Sharing, and Motivated  Motor:  Normal  Speech/Language:   Clear and Coherent  Affect:  Depressed and anxious  Mood:  anxious  Thought process:  goal directed  Thought content:    Rumination  Sensory/Perceptual disturbances:    WNL  Orientation:  oriented to person, place, time/date, situation, day of week, month of year, year, and stated date of Feb. 4, 2025  Attention:  Good  Concentration:  Good  Memory:  WNL  Fund of knowledge:   Good  Insight:    Good and Fair  Judgment:   Good  Impulse Control:  Good   Risk Assessment: Danger to Self:  No Self-injurious Behavior: No Danger to Others: No Duty to Warn:no Physical Aggression / Violence:No  Access to Firearms a concern: No  Gang Involvement:No   Subjective:    Patient today reporting in session her symptoms of anxiety, depression noticeably improved, and working on some ongoing family and relationship stressors. Difficulty dealing with multiple stressors affecting my closer personal relationships where there is more distance between them now.  Needed session today to talk through her anxiety, hurt, decisions to make re: herself personally, her work and some close friends who drain my social batteries, and miscommunication between her and guy she has dated recently. Less grief, especially less after talking with the guy she had been dating and they agreed to end their relationship. Patient did well in processing this more in session today, also looking at where she stands with all the issues involved. Some tears but not overly upset, however hurt by person's comments as noted in session, and we made a plan to  pick up on this next session as we ran out of time today.  Interventions: Cognitive Behavioral Therapy and Ego-Supportive  Long term goal: Reduce overall level, frequency, and intensity of the anxiety so that daily functioning is not impaired. Short term goal: Increase understanding of beliefs and messages that produce anxiety, depression, or worries. Strategy: Identify, challenge, and replace anxious/depressive/fearful self-talk with positive, realistic, and empowering self-talk   Diagnosis:   ICD-10-CM   1. Generalized anxiety disorder  F41.1      Plan:  Patient in session today working further on her anxiety, depression, and family/friend issues and how to make them healthier.  Has also been involved in a newer dating relationship and needed to talk about some issues surrounding this in more detail today.  Followed up on working some on her issues that she is realizing she needs to focus on for herself and not just for others. Encouraged patient in her practice of more positive and self affirming behaviors as noted in sessions including: Refrain from taking on extra work shifts as this adds to her stress and anxiety, work with challenging circumstances within her family and friend relationships, interrupt negative thought patterns and be able to replace them with more positive thoughts that do not feed her depression nor anxiety, stay in touch with her small group of friends and being more open to making new friends, remain in the present versus the past or jumping too far into the future, use of positive self talk, recognize how anxiety is not  a protective like she has felt over the years and continue her work with goal-directed behaviors to move in a direction that supports her gradually improving emotional health and her outlook into the future.  Patient works hard in sessions and is showing progress, supporting her as she continues to work with her goal-directed behaviors to move and a  more hopeful and healthier direction.  Goal review and progress/challenges noted with patient.  Next appointment within 2 to 3 weeks.   Barnie Bunde, LCSW

## 2023-12-14 ENCOUNTER — Ambulatory Visit (INDEPENDENT_AMBULATORY_CARE_PROVIDER_SITE_OTHER): Payer: 59 | Admitting: Psychiatry

## 2023-12-14 DIAGNOSIS — F411 Generalized anxiety disorder: Secondary | ICD-10-CM | POA: Diagnosis not present

## 2023-12-14 NOTE — Progress Notes (Signed)
 [facility] Counselor/Therapist Progress Note  Patient ID: Brianna Barr, MRN: [id number],    Date: 12/14/2023  Time Spent: 55 minutes   Treatment Type: Individual Therapy  Reported Symptoms: anxiety, depression, family stressors   Mental Status Exam:  Appearance:   Casual     Behavior:  Appropriate, Sharing, and Motivated  Motor:  Normal  Speech/Language:   Clear and Coherent  Affect:  Depressed and anxious  Mood:  anxious and depressed  Thought process:  goal directed  Thought content:    Obsessive thoughts  Sensory/Perceptual disturbances:    WNL  Orientation:  oriented to person, place, time/date, situation, day of week, month of year, year, and stated date of 12/14/2023  Attention:  Good  Concentration:  Good  Memory:  WNL  Fund of knowledge:   Good  Insight:    Good  Judgment:   Good  Impulse Control:  Good   Risk Assessment: Danger to Self:  No Self-injurious Behavior: No Danger to Others: No Duty to Warn:no Physical Aggression / Violence:No  Access to Firearms a concern: No  Gang Involvement:No   Subjective:   Patient in for session today and reporting symptoms of anxiety, some depression but improved, and family and relationship stressors. Recently ended dating relationship that she had hoped would be a positive thing but turned out to not be healthy and there were some "differences that I didn't feel I could compromise on." Talked through concerns of her recent, brief relationship and her strengths, her disappointment, and some "indecision" that patient explored in session today. Recent anniversary date past  week of her former fiance discussed in more detail today as well as how it still impacts patient and some unresolved grief now, which seemed to be helpful for patient today in processing her anxiety, depression, and grief. "I think I need to unfriend him online and not see it as being rude or mean. Talked further on this and seems to be doing some  healthier thinking for herself. Feels she is moving forward some and not quite as "hurt". "No bitterness nor truly hard feelings at this point, but disappointed it didn't work out."  Interventions: Engineer, manufacturing systems Therapy and Ego-Supportive  Long term goal: Reduce overall level, frequency, and intensity of the anxiety so that daily functioning is not impaired. Short term goal: Increase understanding of beliefs and messages that produce anxiety, depression, or "worries". Strategy: Identify, challenge, and replace anxious/depressive/fearful self-talk with positive, realistic, and empowering self-talk   Diagnosis:   ICD-10-CM   1. Generalized anxiety disorder  F41.1      Plan:   Patient today in session and working further on her depression, anxiety, and some family/friend issues and working on these and ways to feel healthier.  She recently ended the newer dating relationship due to specific issues involved.  Not all details included in this note due to patient privacy needs.  She continues to recognize her need to focus on changes that are healthy for her and not overly focusing on others at this point. Reminded and encouraged patient in her practice of more positive and self affirming behaviors as noted in session including: Refraining from taking on extra work shifts as this adds to her stress and anxiety, work with challenging circumstances within her family and friend relationships, interrupt negative thought patterns and be able to replace them with more positive thoughts that do not feed her depression nor anxiety, stay in touch with her small group of friends and  being more open to making new friends, remain in the present versus the past or jumping too far into the future, use of positive self talk, recognize how anxiety is not a "protective" like she has felt over the years and continue her work with goal-directed behaviors to move in a direction that supports her gradually improving  emotional health and her outlook into the future.  Patient continues to work hard in sessions and definitely is showing progress, and needs to continue work with her goal-directed behaviors to keep moving in a more hopeful and healthier direction.  Goal review and progress/challenges noted with patient.  Next appointment within 2 to 3 weeks.   Mathis Fare, LCSW

## 2023-12-28 ENCOUNTER — Ambulatory Visit: Payer: Commercial Managed Care - PPO | Admitting: Psychiatry

## 2023-12-28 ENCOUNTER — Ambulatory Visit (INDEPENDENT_AMBULATORY_CARE_PROVIDER_SITE_OTHER)

## 2023-12-28 DIAGNOSIS — F411 Generalized anxiety disorder: Secondary | ICD-10-CM | POA: Diagnosis not present

## 2023-12-28 DIAGNOSIS — J309 Allergic rhinitis, unspecified: Secondary | ICD-10-CM

## 2023-12-28 NOTE — Progress Notes (Signed)
 [facility] Counselor/Therapist Progress Note  Patient ID: Brianna Barr, MRN: [id number],    Date: 12/28/2023  Time Spent: 53 minutes   Treatment Type: Individual Therapy  Reported Symptoms: anxiety, depression, family stressors    Mental Status Exam:  Appearance:   Casual     Behavior:  Appropriate, Sharing, and Motivated  Motor:  Normal  Speech/Language:   Clear and Coherent  Affect:  Depressed and anxious  Mood:  anxious and depressed  Thought process:  goal directed  Thought content:    WNL  Sensory/Perceptual disturbances:    WNL  Orientation:  oriented to person, place, time/date, situation, day of week, month of year, year, and stated date of March3, 2025  Attention:  Good  Concentration:  Good  Memory:  WNL  Fund of knowledge:   Good  Insight:    Good  Judgment:   Good  Impulse Control:  Good   Risk Assessment: Danger to Self:  No Self-injurious Behavior: No Danger to Others: No Duty to Warn:no Physical Aggression / Violence:No  Access to Firearms a concern: No  Gang Involvement:No   Subjective:  Patient in session today and working further on her anxiety, depression which has been improved some, and some family/friend stressors. Some stressors emerging more in her overall life and in her being a part of a church start-up (which has been something she felt optimistic about. Reports "praying a lot about this but concerned about how this opportunity  in church start-up has led her to feel more self-doubt and fears that it could negatively impact some close friendships that she has within the start-up team for this church.Worked today on her heightened anxiety, some depression, and self-negating which has been blocking/interrupting patient progress.  Showed heightened motivation today and worked consistently during session on confronting her negative/doubtful views of herself and in some cases others, and better able to see how her overall outlook including  her anxiety and some depression have all contributed to her getting stuck and working on her goals.  Role-played some today in session which seemed to be helpful to patient as to strategies she can use at work and also in her church environment as they continue planning and preparing for the future as noted above.  Processed some leftover feelings from her recent break-up and a short-term dating relationship but feels that that was not a healthy commitment for her and although disappointed that it was not what she had hoped, feels that moving forward is the right thing for her now.  Engaging in a healthier type of thinking and making decisions for herself as this is something she has been doing much more recently and reports that it does "feel different" but recognizing "it is a better way of thinking".  Following through on some of her thoughts and plans can be a challenge but patient is more actively participating in that now and seems more ready for certain changes in growth.   Interventions: Cognitive Behavioral Therapy and Ego-Supportive  Long term goal: Reduce overall level, frequency, and intensity of the anxiety so that daily functioning is not impaired. Short term goal: Increase understanding of beliefs and messages that produce anxiety, depression, or "worries". Strategy: Identify, challenge, and replace anxious/depressive/fearful self-talk with positive, realistic, and empowering self-talk    Diagnosis:   ICD-10-CM   1. Generalized anxiety disorder  F41.1      Plan:   Patient in session today continuing working on her goals aimed at decreasing  her symptoms including depression, anxiety, and challenging family/friend relationships, and especially working on some issues surrounding self-doubt.  Showing noticeably more strength and commitment today and feels like it is coming from a gradual increase in her efforts and also in her choice to try and feel better about herself. Encouraged  patient in her practice of more positive and self affirming behaviors as noted in sessions including: Choosing not to take on extra work shifts as this adds to her stress and anxiety, work with challenging circumstances within her family and friend relationships, interrupt negative thought patterns to be able to replace them with more positive thoughts that do not feed her depression or anxiety, stay in touch with her small group of friends, be more open to making new friends, stay in the present versus the past or jumping too far into the future, use of positive self talk, recognize how anxiety is not a "protective" like she has felt over the years, continue her work and goal-directed behaviors to move in a direction that supports her gradually improving emotional health and her overall wellbeing.  Patient shows good effort in sessions even though it is difficult for her, and definitely is making progress, supporting her need to continue working with goal-directed behaviors to keep moving in a more hopeful and healthier direction.  Goal review and progress/challenges noted with patient.  Next appointment within 2 to 3 weeks.   Mathis Fare, LCSW

## 2023-12-31 ENCOUNTER — Encounter (INDEPENDENT_AMBULATORY_CARE_PROVIDER_SITE_OTHER): Payer: Self-pay

## 2024-01-04 ENCOUNTER — Other Ambulatory Visit (HOSPITAL_BASED_OUTPATIENT_CLINIC_OR_DEPARTMENT_OTHER): Payer: Self-pay

## 2024-01-05 ENCOUNTER — Ambulatory Visit (INDEPENDENT_AMBULATORY_CARE_PROVIDER_SITE_OTHER)

## 2024-01-05 ENCOUNTER — Encounter: Payer: Self-pay | Admitting: Podiatry

## 2024-01-05 ENCOUNTER — Ambulatory Visit: Admitting: Podiatry

## 2024-01-05 ENCOUNTER — Other Ambulatory Visit (HOSPITAL_BASED_OUTPATIENT_CLINIC_OR_DEPARTMENT_OTHER): Payer: Self-pay

## 2024-01-05 VITALS — Ht 61.0 in | Wt 264.2 lb

## 2024-01-05 DIAGNOSIS — M722 Plantar fascial fibromatosis: Secondary | ICD-10-CM

## 2024-01-05 DIAGNOSIS — M7752 Other enthesopathy of left foot: Secondary | ICD-10-CM | POA: Diagnosis not present

## 2024-01-05 DIAGNOSIS — M7751 Other enthesopathy of right foot: Secondary | ICD-10-CM | POA: Diagnosis not present

## 2024-01-05 DIAGNOSIS — M79671 Pain in right foot: Secondary | ICD-10-CM

## 2024-01-05 DIAGNOSIS — M79672 Pain in left foot: Secondary | ICD-10-CM

## 2024-01-05 MED ORDER — MELOXICAM 15 MG PO TABS
15.0000 mg | ORAL_TABLET | Freq: Every day | ORAL | 1 refills | Status: DC
  Filled 2024-01-05: qty 30, 30d supply, fill #0
  Filled 2024-06-20: qty 30, 30d supply, fill #1

## 2024-01-05 NOTE — Progress Notes (Unsigned)
 Orthotics   Patient was present and evaluated for Custom molded foot orthotics. Patient will benefit from CFO's to provide total contact to BIL MLA's helping to balance and distribute body weight more evenly across BIL feet helping to reduce plantar pressure and pain. Orthotic will also encourage FF / RF alignment  Patient was scanned today and will return for fitting upon receipt

## 2024-01-05 NOTE — Progress Notes (Unsigned)
 Chief Complaint  Patient presents with   Plantar Fasciitis    Pt is here due to bilateral foot pain states she has plantar fasciitis and been having a flare up since January, states the pain sometimes is unbearable.   HPI: 28 y.o. female presenting today with c/o pain in the bottom of the bilateral heel.  Pain is rated as 8/10.  Patient has had this in the past and states that this has been flared up for the past couple of months.  Denies any recent injury and bruising.  She saw Dr. Charlsie Merles in 2022 for the same issue.  Past Medical History:  Diagnosis Date   Anxiety    Asthma    Depression    Overactive bladder    Stress incontinence     Past Surgical History:  Procedure Laterality Date   WISDOM TOOTH EXTRACTION      Allergies  Allergen Reactions   Covid-19 (Mrna Bivalent) Vaccine Proofreader) [Covid-19 (Mrna) Vaccine] Other (See Comments)    Arm pain, stiffness in arm and neck, redness and swelling of arm, severe itching     Physical Exam: General: The patient is alert and oriented x3 in no acute distress.  Dermatology:  No ecchymosis, erythema, or edema bilateral.  No open lesions.    Vascular: Palpable pedal pulses bilaterally. Capillary refill within normal limits.  No appreciable edema.    Neurological: Light touch sensation intact bilateral.  MMT 5/5 to lower extremity bilateral. Negative Tinel's sign with percussion of the posterior tibial nerve on the affected extremity.    Musculoskeletal Exam:  There is pain on palpation of the plantarmedial & plantarcentral aspect of bilateral heel.  No gaps or nodules within the plantar fascia.  Positive Windlass mechanism bilateral.  Antalgic gait noted with first few steps upon standing.  No pain on palpation of achilles tendon bilateral.  Ankle df less than 10 degrees with knee extended b/l.  Radiographic Exam (bilateral foot, 3 weightbearing views, 01/05/2024):  Normal osseous mineralization. Joint spaces preserved.  No fractures  or osseous irregularities noted.  Assessment/Plan of Care: 1. Pain in both feet   2. Plantar fasciitis   3. Bursitis of both feet     Meds ordered this encounter  Medications   meloxicam (MOBIC) 15 MG tablet    Sig: Take 1 tablet (15 mg total) by mouth daily.    Dispense:  30 tablet    Refill:  1   -Reviewed etiology of plantar fasciitis with patient.  Discussed treatment options with patient today, including cortisone injection, NSAID course of treatment, stretching exercises, physical therapy, use of night splint, rest, icing the heel, arch supports/orthotics, and supportive shoe gear.    With the patient's verbal consent, a corticosteroid injection was administered to the bilateral heel, consisting of a mixture of 1% lidocaine plain, 0.5% Sensorcaine plain, and Kenalog-10 for a total of 1.5cc administered.  A Band-aid was applied. Pain level post-injection is 5/10.  Reached out to our pedorthist to see if she was able to see the patient today for an orthotic consult.  She was able to clear room in her schedule to accommodate this patient and obtain molding's for custom mallet orthotics for plantar fasciitis.  Appreciate our pedorthist's ability to work her into the schedule today.  Prescription for meloxicam 15 mg 1 tablet p.o. daily sent to her pharmacy with 1 refill.  Return in about 5 weeks (around 02/09/2024) for f/u plantar fasciitis.   Clerance Lav, DPM, FACFAS [facility]     [address] [place], [place] [address]                Office [phone]  Fax [phone]

## 2024-01-11 ENCOUNTER — Ambulatory Visit (INDEPENDENT_AMBULATORY_CARE_PROVIDER_SITE_OTHER): Payer: Commercial Managed Care - PPO | Admitting: Psychiatry

## 2024-01-11 DIAGNOSIS — F411 Generalized anxiety disorder: Secondary | ICD-10-CM | POA: Diagnosis not present

## 2024-01-11 NOTE — Progress Notes (Signed)
 [facility] Counselor/Therapist Progress Note  Patient ID: Brianna Barr, MRN: [id number],    Date: 01/11/2024  Time Spent: 50 minutes   Treatment Type: Individual Therapy  Reported Symptoms: anxiety, depression, family stressors, issues with father's health   Mental Status Exam:  Appearance:   Casual and Neat     Behavior:  Appropriate, Sharing, and Motivated  Motor:  Normal  Speech/Language:   Clear and Coherent  Affect:  anxious  Mood:  anxious and depressed  Thought process:  goal directed  Thought content:    Rumination  Sensory/Perceptual disturbances:    WNL  Orientation:  oriented to person, place, time/date, situation, day of week, month of year, year, and stated date of January 11, 2024  Attention:  Good  Concentration:  Good  Memory:  WNL  Fund of knowledge:   Good  Insight:    Good  Judgment:   Good  Impulse Control:  Good   Risk Assessment: Danger to Self:  No Self-injurious Behavior: No Danger to Others: No Duty to Warn:no Physical Aggression / Violence:No  Access to Firearms a concern: No  Gang Involvement:No   Subjective:   Patient today in session continuing to work on her anxiety, family/friend stressors and challenges, and her depression. 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 Some of "my feelings and fears and doubt are on-hold as the group of people involved have not met to talk-through the feelings, particularly her best-friend and they plan to meet and talk soon. Patient takes her commitments to church very seriously and just wants to make sure   are healthy. Impacted with stress of parents and their health concerns, and significant unanswered questions about dad's health. Mom got severance package from work (not her choice) and retired.  Lots of stress for patient and some information that is difficult for patient to absorb.  (Not  all details included in this note due to patient and her family's privacy needs.) did seem to feel calmer after talking through more of the situation today and also better understanding how this has exacerbated some of her own stress and anxiety.  Not feeling as "stuck" and some of the thoughts/feelings from last session.  Is stressed however doing some good thinking and decision making for herself and some that can also impact certain friendships and the family and positive ways.  Feels that she is thinking things through more clearly and not rushing to judgment on certain thoughts and situations.  Interventions: Cognitive Behavioral Therapy and Ego-Supportive  Long term goal: Reduce overall level, frequency, and intensity of the anxiety so that daily functioning is not impaired. Short term goal: Increase understanding of beliefs and messages that produce anxiety, depression, or "worries". Strategy: Identify, challenge, and replace anxious/depressive/fearful self-talk with positive, realistic, and empowering self-talk  Diagnosis:   ICD-10-CM   1. Generalized anxiety disorder  F41.1      Plan:   Patient today in session participating well and focusing on her goals to decrease her anxiety and depression, work better with challenging family/friend relationships, and trying to decrease her self-doubt as she continues to work on her anxiety, depression, and significant stressors within her family/friends.  Continues to show more strength and commitment today and in some ways some resilience even in the midst of a difficult time more recently and currently.  (Again not all details included  in this note due to patient and her family's privacy needs.)  Reminded and encouraged patient to be practicing more positive and self affirming behaviors as noted in sessions including: Interrupt negative thought patterns and replace them with more positive thoughts that do not feed her anxiety nor depression, stay in  touch with her small group of friends, be more open to making new friends, remain in the present versus the past or jumping too far into the future, use of positive self talk, recognize how anxiety is not a "protective" like she has felt previously, continue her work and goal-directed behaviors to move in a direction that supports her gradually improving emotional health, work with challenging circumstances within her family and friendships, and choosing not to take on extra work shifts as this adds to her stress and anxiety.  Goal review and progress/challenges noted with patient.  Next appointment within 2 to 3 weeks.   Mathis Fare, LCSW

## 2024-01-18 ENCOUNTER — Telehealth: Payer: Self-pay

## 2024-01-18 NOTE — Telephone Encounter (Signed)
 Left vm to schedule orthotic pick up

## 2024-01-20 ENCOUNTER — Ambulatory Visit (INDEPENDENT_AMBULATORY_CARE_PROVIDER_SITE_OTHER): Payer: Self-pay

## 2024-01-20 DIAGNOSIS — J309 Allergic rhinitis, unspecified: Secondary | ICD-10-CM

## 2024-01-25 ENCOUNTER — Ambulatory Visit (INDEPENDENT_AMBULATORY_CARE_PROVIDER_SITE_OTHER): Payer: Commercial Managed Care - PPO | Admitting: Psychiatry

## 2024-01-25 DIAGNOSIS — F411 Generalized anxiety disorder: Secondary | ICD-10-CM | POA: Diagnosis not present

## 2024-01-25 NOTE — Progress Notes (Addendum)
 [facility] Counselor/Therapist Progress Note  Patient ID: Brianna Barr, MRN: [id number],    Date: 01/25/2024  Time Spent: 55 minutes   Treatment Type: Individual Therapy  Reported Symptoms: anxiety, depression, family stressors, concerned with issues regarding dad's health   Mental Status Exam:  Appearance:   Casual and Neat     Behavior:  Appropriate, Sharing, and Motivated  Motor:  Normal  Speech/Language:   Clear and Coherent  Affect:  Depressed and anxious  Mood:  anxious and depressed  Thought process:  goal directed  Thought content:    Rumination  Sensory/Perceptual disturbances:    WNL  Orientation:  oriented to person, place, time/date, situation, day of week, month of year, year, and stated date of January 25, 2024  Attention:  Good  Concentration:  Good  Memory:  WNL  Fund of knowledge:   Good  Insight:    Good  Judgment:   Good  Impulse Control:  Good   Risk Assessment: Danger to Self:  No Self-injurious Behavior: No Danger to Others: No Duty to Warn:no Physical Aggression / Violence:No  Access to Firearms a concern: No  Gang Involvement:No   Subjective:   Patient  today reporting in session symptoms including family/friend stressors/challenges, anxiety, and depression. 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 Her best friend and husband are also involved in church start-up and are a part of patient's issues within her church environment. Stated that she needed to work further today on her boundary challenges and better understanding, in session today. Does have planned a "fun weekend away" with her best friend and hoping they will have some good "talk time" while they are traveling. Very concerned about her dad's continued health challenges and shares her fears and concerns today. Adds that she is the family member who is "keeping everyone in the  family up-to-date on his medical condition although tries to hide some things from family. Very tearful at times and able to share and process her sadness re: dad's illness and noticeable health decline and seemed to really value this time today. Continues to really think situations through and not rush to judgement.  Interventions: Cognitive Behavioral Therapy and Ego-Supportive  Long term goal: Reduce overall level, frequency, and intensity of the anxiety so that daily functioning is not impaired. Short term goal: Increase understanding of beliefs and messages that produce anxiety, depression, or "worries". Strategy: Identify, challenge, and replace anxious/depressive/fearful self-talk with positive, realistic, and empowering self-talk  Diagnosis:   ICD-10-CM   1. Generalized anxiety disorder  F41.1      Plan:  Patient today in session sharing openly her fears and anxieties re: dad's health decline and working on her goals to decrease her anxiety and depression as she reports trying to work better within challenging family and friend relationships and to diffuse her own self-doubt as she works to better manage her anxiety, depression, and stressors within the family and beyond.  Continues to show some resilience even if she confronts difficult situations especially with her father's health status.  (Not all details included in this note due to patient and her family's privacy needs.) encouraged patient in her practice of more positive and self affirming behaviors as noted in sessions including: Interrupt negative thought patterns and replace them with more positive thoughts that do not feed her anxiety nor depression, be more open to making new friends and strengthening  already existing friendships, remain in the present versus the past or too far into the future, stay in touch with her small group of friends more regularly as she is able, use of positive self talk, and continue her work and  goal-directed behaviors to move in a direction that supports her gradually improving emotional health, her work with challenging circumstances within her family and friendships, and making the choice not to take on extra work shifts as this adds to patient's stress and anxiety.  This patient has made progress and needs to continue her work with goal-directed behaviors.  Goal review and progress/challenges noted with patient.  Next appt within 2-3 weeks.   Mathis Fare, LCSW

## 2024-02-08 ENCOUNTER — Ambulatory Visit (INDEPENDENT_AMBULATORY_CARE_PROVIDER_SITE_OTHER): Payer: Commercial Managed Care - PPO | Admitting: Psychiatry

## 2024-02-08 DIAGNOSIS — F411 Generalized anxiety disorder: Secondary | ICD-10-CM

## 2024-02-08 NOTE — Progress Notes (Signed)
Patient no showed for appt.

## 2024-02-16 ENCOUNTER — Ambulatory Visit (INDEPENDENT_AMBULATORY_CARE_PROVIDER_SITE_OTHER)

## 2024-02-16 DIAGNOSIS — J309 Allergic rhinitis, unspecified: Secondary | ICD-10-CM

## 2024-02-18 ENCOUNTER — Ambulatory Visit: Payer: 59 | Admitting: Internal Medicine

## 2024-02-18 ENCOUNTER — Encounter: Payer: Self-pay | Admitting: Internal Medicine

## 2024-02-18 ENCOUNTER — Other Ambulatory Visit (HOSPITAL_BASED_OUTPATIENT_CLINIC_OR_DEPARTMENT_OTHER): Payer: Self-pay

## 2024-02-18 VITALS — BP 124/64 | HR 86 | Temp 97.5°F | Resp 17 | Wt 263.0 lb

## 2024-02-18 DIAGNOSIS — J4520 Mild intermittent asthma, uncomplicated: Secondary | ICD-10-CM | POA: Diagnosis not present

## 2024-02-18 DIAGNOSIS — J3089 Other allergic rhinitis: Secondary | ICD-10-CM | POA: Diagnosis not present

## 2024-02-18 DIAGNOSIS — J302 Other seasonal allergic rhinitis: Secondary | ICD-10-CM | POA: Insufficient documentation

## 2024-02-18 MED ORDER — EPINEPHRINE 0.3 MG/0.3ML IJ SOAJ
0.3000 mg | INTRAMUSCULAR | 1 refills | Status: AC | PRN
  Filled 2024-02-18 – 2024-06-20 (×2): qty 2, 1d supply, fill #0

## 2024-02-18 NOTE — Progress Notes (Signed)
 FOLLOW UP Date of Service/Encounter:  02/18/24  Subjective:  Brianna Barr (DOB: Feb 27, 1996) is a 28 y.o. female who [facility] in re-evaluation of the following:  allergic rhinitis, asthma  History obtained from: chart review and patient.  For Review, LV was on 08/20/23  with Dr.Usher Hedberg seen for routine follow-up. See below for summary of history and diagnostics.   Therapeutic plans/changes recommended: FEV1 87% ----------------------------------------------------- Pertinent History/Diagnostics:  Asthma: Triggered by allergies. Takes singulair . - nonobstructive patter spirometry (02/16/23)-FEV1 82% Allergic Rhinitis:  sneezing, runny nose, itchy eyes. Symptoms are year-round but worse in the spring and fall  Current meds: allegra or xyzal , pataday  PRN, ryaltris  2 SEN BID PRN AIT RUSH started 04/24/23, maintenance reached 08/26/23. - SPT environmental panel (02/16/23): positive to grasses, ragweed, weeds, trees, indoor molds, outdoor molds, dust mites, and cat.  --------------------------------------------------- Today presents for follow-up. Discussed the use of AI scribe software for clinical note transcription with the patient, who gave verbal consent to proceed.  History of Present Illness   Brianna Barr is a 28 year old female who presents for follow-up on her allergy  treatment.  Her spring allergies are currently well-managed, and she has not been taking her allergy  medications regularly, as she forgot and did not feel the need to. Previously, she would take Xyzal  and Singulair  when coming for her allergy  shots. She is currently not taking any allergy  medications on a daily basis.  She feels that her allergy  shots are working well. Her last shot was two days ago, and she is receiving maintenance injections. No breathing issues related to allergies are reported, and she has stopped taking Singulair  daily as her asthma  flare-ups are exercise-induced rather than allergy -related.  She has started a new workout plan, which has led to more frequent use of her rescue inhaler. She does not use the inhaler before working out. She does not require a refill for her rescue inhaler at this time.  She is not using Ryaltris  and only needs a refill for her EpiPen , which is used as a precaution while receiving allergy  shots. Her EpiPen  was last updated in 2024 and needs a refill.       All medications reviewed by clinical staff and updated in chart. No new pertinent medical or surgical history except as noted in HPI.  ROS: All others negative except as noted per HPI.   Objective:  BP 124/64   Pulse 86   Temp (!) 97.5 F (36.4 C) (Temporal)   Resp 17   Wt 263 lb (119.3 kg)   SpO2 98%   BMI 49.69 kg/m  Body mass index is 49.69 kg/m. Physical Exam: General Appearance:  Alert, cooperative, no distress, appears stated age  Head:  Normocephalic, without obvious abnormality, atraumatic  Eyes:  Conjunctiva clear, EOM's intact  Ears EACs normal bilaterally and normal TMs bilaterally  Nose: Nares normal, hypertrophic turbinates, normal mucosa, and no visible anterior polyps  Throat: Lips, tongue normal; teeth and gums normal, normal posterior oropharynx  Neck: Supple, symmetrical  Lungs:   clear to auscultation bilaterally, Respirations unlabored, no coughing  Heart:  regular rate and rhythm and no murmur, Appears well perfused  Extremities: No edema  Skin: Skin color, texture, turgor normal and no rashes or lesions on visualized portions of skin  Neurologic: No gross deficits   Labs:  Lab Orders  No laboratory test(s) ordered today    Spirometry:  Tracings reviewed. Her effort: Good reproducible efforts.  FVC: 2.64L FEV1: 2.17L, 83% predicted FEV1/FVC ratio: 0.82 Interpretation: Spirometry consistent with normal pattern.  Please see scanned spirometry results for details.  Assessment/Plan   Allergic  rhinitis: improved on AIT, now in maintenance - Allergen avoidance towards grasses, ragweed, weeds, trees, indoor molds, outdoor molds, dust mites, and cat. Discontinue Singulair  (montelukast ) - not using consistently and no noticeable difference Pataday  (olopatadine ) one drop per eye daily as needed for itchy/watery eyes. Allegra or Xyzal  1 tab daily.  These are long-acting antihistamines that are over-the-counter options.  Consider Ryaltris  2 sprays each nostril twice a day as needed for runny or stuffy nose.  Continue allergy  injections per protocol. Have access to epinephrine  autoinjector.   Asthma: at goal - Daily controller medication(s): none - Prior to physical activity: albuterol  2 puffs 10-15 minutes before physical activity. - Rescue medications: albuterol  2 puffs every 4-6 hours as needed  - Asthma control goals:  * Full participation in all desired activities (may need albuterol  before activity) * Albuterol  use two time or less a week on average (not counting use with activity) * Cough interfering with sleep two time or less a month * Oral steroids no more than once a year * No hospitalizations    Follow-up in 6 months or sooner if needed It was a pleasure seeing you again in clinic today! Thank you for allowing me to participate in your care.  Other: none  Jonathon Neighbors, MD  [facility] 

## 2024-02-18 NOTE — Patient Instructions (Addendum)
 Allergic rhinitis: improved on AIT - Allergen avoidance towards grasses, ragweed, weeds, trees, indoor molds, outdoor molds, dust mites, and cat. Discontinue Singulair  (montelukast ) - not using consistently and no noticeable difference Pataday  (olopatadine ) one drop per eye daily as needed for itchy/watery eyes. Allegra or Xyzal  1 tab daily.  These are long-acting antihistamines that are over-the-counter options.  Consider Ryaltris  2 sprays each nostril twice a day as needed for runny or stuffy nose.  Continue allergy  injections per protocol. Have access to epinephrine  autoinjector.   Asthma:  - Daily controller medication(s): none - Prior to physical activity: albuterol  2 puffs 10-15 minutes before physical activity. - Rescue medications: albuterol  2 puffs every 4-6 hours as needed  - Asthma control goals:  * Full participation in all desired activities (may need albuterol  before activity) * Albuterol  use two time or less a week on average (not counting use with activity) * Cough interfering with sleep two time or less a month * Oral steroids no more than once a year * No hospitalizations    Follow-up in 6 months or sooner if needed It was a pleasure seeing you again in clinic today! Thank you for allowing me to participate in your care.  Jonathon Neighbors, MD [facility]

## 2024-02-22 ENCOUNTER — Ambulatory Visit (INDEPENDENT_AMBULATORY_CARE_PROVIDER_SITE_OTHER): Admitting: Psychiatry

## 2024-02-22 DIAGNOSIS — F411 Generalized anxiety disorder: Secondary | ICD-10-CM | POA: Diagnosis not present

## 2024-02-22 NOTE — Progress Notes (Signed)
 [facility] Counselor/Therapist Progress Note  Patient ID: Brianna Barr, MRN: [id number],    Date: 02/22/2024  Time Spent: 53 minutes   Treatment Type: Individual Therapy  Reported Symptoms: anxiety, depression improving, family stressors, concerned with issues re: dad's health   Mental Status Exam:  Appearance:   Casual and Neat     Behavior:  Appropriate, Sharing, and Motivated  Motor:  Normal  Speech/Language:   Clear and Coherent  Affect:  Anxious, some depression  Mood:  anxious and depressed  Thought process:  goal directed  Thought content:    Rumination  Sensory/Perceptual disturbances:    WNL  Orientation:  oriented to person, place, time/date, situation, day of week, month of year, year, and stated date of February 22, 2024  Attention:  Good  Concentration:  Good  Memory:  WNL  Fund of knowledge:   Good  Insight:    Good  Judgment:   Good  Impulse Control:  Good   Risk Assessment: Danger to Self:  No Self-injurious Behavior: No Danger to Others: No Duty to Warn:no Physical Aggression / Violence:No  Access to Firearms a concern: No  Gang Involvement:No   Subjective:   Patient in for session today and reports symptoms of anxiety, depression, family/friend stressors/challenges. Issues re: upcoming  "church start-up" in which patient is involved., "but is feeling less stressed overall about it but some stress". Not a sense of "urgency" about it. 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 Concerned for dad's health. Still some boundary issues and challenges personally and within her upcoming church role. Her concern for dad's health issues continue and "nothing happens fast", and he has been transferred to care through [facility] "but appt isn't til July". Very sensitive to ways she  "feel different from others" and talked about several examples today.Discussed more positive self-care, especially as she is awaiting further appts for her father. Feeling very responsible to her family during this time, although patient having to step up more to take care of her own self at this point.  Feeling very responsible to family as she processes further the stress she is sensing more and more. States mom is sometimes more open and sensitive with patient. Less tearfulness overall today in session.   Interventions: Cognitive Behavioral Therapy and Ego-Supportive  Long term goal: Reduce overall level, frequency, and intensity of the anxiety so that daily functioning is not impaired. Short term goal: Increase understanding of beliefs and messages that produce anxiety, depression, or "worries". Strategy: Identify, challenge, and replace anxious/depressive/fearful self-talk with positive, realistic, and empowering self-talk  Diagnosis:   ICD-10-CM   1. Generalized anxiety disorder  F41.1      Plan:  Patient working in session today further on her anxieties and fears, some within her own life and some more related to my dad's health situation. Siblings/family try to "lighten the situation and that doesn't help, and in fact ends up being more hurtful."  Reviewed this with patient again at this point in session including some ways that she can clearly ask for what she needs within the family and also role model respecting their boundaries as well.  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  Still showing some resilience and continues trying to manage difficult situations at work and within her family more effectively.  Father's health status remains a concern.  (Not all details included in this note due to patient family privacy  needs).  Encouraged patient in practicing more positive and self affirming behaviors as noted in sessions including: Be more open to making new friends and strengthening already existing friendships, interrupt negative thought patterns and replace them with more positive thoughts that do not feed her anxiety nor depression, stay in the present versus the past or too far into the future, stay in touch with her small group of friends more regularly as she is able, use of positive self talk, and continue working with goal-directed behaviors to move in a direction that supports her gradual improvement in her emotional health, her work with challenging circumstances within her family and friendships, and making healthy choices not to take on extra work shifts as this adds to her stress and anxiety.  Patient has definitely made some progress and needs to continue her work with goal-directed behaviors in order to keep moving in a healthier direction now and in the future.  Goal review and progress/challenges noted with patient.  Next appt within 2-3 weeks.   Kelleen Patee, LCSW

## 2024-02-23 ENCOUNTER — Other Ambulatory Visit (HOSPITAL_COMMUNITY)
Admission: RE | Admit: 2024-02-23 | Discharge: 2024-02-23 | Disposition: A | Discharge status: Home / Self Care | Source: Ambulatory Visit | Attending: Obstetrics and Gynecology | Admitting: Obstetrics and Gynecology

## 2024-02-23 ENCOUNTER — Ambulatory Visit (INDEPENDENT_AMBULATORY_CARE_PROVIDER_SITE_OTHER)

## 2024-02-23 VITALS — BP 109/74 | HR 91 | Wt 261.0 lb

## 2024-02-23 DIAGNOSIS — R350 Frequency of micturition: Secondary | ICD-10-CM

## 2024-02-23 DIAGNOSIS — N898 Other specified noninflammatory disorders of vagina: Secondary | ICD-10-CM | POA: Diagnosis not present

## 2024-02-23 DIAGNOSIS — M545 Low back pain, unspecified: Secondary | ICD-10-CM | POA: Diagnosis not present

## 2024-02-23 LAB — POCT URINALYSIS DIPSTICK
Bilirubin, UA: NEGATIVE
Glucose, UA: NEGATIVE
Ketones, UA: NEGATIVE
Nitrite, UA: NEGATIVE
Protein, UA: NEGATIVE
Spec Grav, UA: 1.025 (ref 1.010–1.025)
Urobilinogen, UA: 0.2 U/dL
pH, UA: 6 (ref 5.0–8.0)

## 2024-02-23 NOTE — Progress Notes (Addendum)
 SUBJECTIVE:  28 y.o. female complains of white vaginal discharge for 2 week(s). Denies abnormal vaginal bleeding or significant pelvic pain or fever. No UTI symptoms. Denies history of known exposure to STD.  No LMP recorded. (Menstrual status: IUD).  OBJECTIVE:  She appears well, afebrile. Urine dipstick: positive for RBC's and positive leukocytes.  ASSESSMENT:  Vaginal Discharge  Vaginal Odor   PLAN:  BVAG, CVAG, probe and urine culture sent to lab. Treatment: To be determined once lab results are received ROV prn if symptoms persist or worsen.   Katrisha Segall l Toi Stelly, CMA

## 2024-02-24 ENCOUNTER — Ambulatory Visit (INDEPENDENT_AMBULATORY_CARE_PROVIDER_SITE_OTHER): Payer: Self-pay

## 2024-02-24 DIAGNOSIS — J309 Allergic rhinitis, unspecified: Secondary | ICD-10-CM | POA: Diagnosis not present

## 2024-02-24 LAB — CERVICOVAGINAL ANCILLARY ONLY
Bacterial Vaginitis (gardnerella): NEGATIVE
Candida Glabrata: NEGATIVE
Candida Vaginitis: NEGATIVE
Comment: NEGATIVE
Comment: NEGATIVE
Comment: NEGATIVE

## 2024-02-25 LAB — URINE CULTURE

## 2024-02-26 ENCOUNTER — Other Ambulatory Visit (HOSPITAL_BASED_OUTPATIENT_CLINIC_OR_DEPARTMENT_OTHER): Payer: Self-pay

## 2024-02-26 ENCOUNTER — Other Ambulatory Visit: Payer: Self-pay | Admitting: Obstetrics and Gynecology

## 2024-02-26 MED ORDER — FLUCONAZOLE 150 MG PO TABS
150.0000 mg | ORAL_TABLET | Freq: Once | ORAL | 0 refills | Status: AC
  Filled 2024-02-26: qty 1, 1d supply, fill #0

## 2024-03-01 ENCOUNTER — Ambulatory Visit (INDEPENDENT_AMBULATORY_CARE_PROVIDER_SITE_OTHER): Payer: Self-pay

## 2024-03-01 DIAGNOSIS — J309 Allergic rhinitis, unspecified: Secondary | ICD-10-CM

## 2024-03-07 ENCOUNTER — Ambulatory Visit: Admitting: Psychiatry

## 2024-03-09 ENCOUNTER — Ambulatory Visit: Admitting: Psychiatry

## 2024-03-09 DIAGNOSIS — F411 Generalized anxiety disorder: Secondary | ICD-10-CM

## 2024-03-09 NOTE — Progress Notes (Signed)
 [facility] Counselor/Therapist Progress Note  Patient ID: Brianna Barr, MRN: [id number],    Date: 03/09/2024  Time Spent: 53 minutes   Treatment Type: Individual Therapy  Reported Symptoms: anxiety, depression improved some, concerned with dad's health issues   Mental Status Exam:  Appearance:   Casual     Behavior:  Appropriate, Sharing, and Motivated  Motor:  Normal  Speech/Language:   Clear and Coherent  Affect:  Depressed and anxious  Mood:  anxious and depressed  Thought process:  goal directed  Thought content:    Rumination  Sensory/Perceptual disturbances:    WNL  Orientation:  oriented to person, place, time/date, situation, day of week, month of year, year, and stated date of Mar 09, 2024  Attention:  Good  Concentration:  Good  Memory:  WNL  Fund of knowledge:   Good  Insight:    Good  Judgment:   Good  Impulse Control:  Good   Risk Assessment: Danger to Self:  No Self-injurious Behavior: No Danger to Others: No Duty to Warn:no Physical Aggression / Violence:No  Access to Firearms a concern: No  Gang Involvement:No   Subjective:  Patient working in session today on her symptoms of depression, anxiety, family/friend stressors/challenges, and work stress. Very concerned about dad's health and after a number of appts and tests, there is still not a clear path of what is going on for him. Was [facility] to [facility] hospitals([facility], [facility], and [place]) but was scheduled for last week of July at [facility]. But was able to actually get earlier appt at [facility] last week, and after much testing, led to diagnosis, that is currently tentative waiting for more information and another test.  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 Boundary issues continue some within her church  role and her work role. Dad's health status is her main concern right now. Reviewed some positive self-care discussed previously, and encouraged patient in using this for herself as well as taking breaks as needed and letting others be of support once she is more comfortable talking with others and sharing some of what is going on with dad medically.   Interventions: Cognitive Behavioral Therapy, Solution-Oriented/Positive Psychology, and Ego-Supportive Long term goal: Reduce overall level, frequency, and intensity of the anxiety so that daily functioning is not impaired. Short term goal: Increase understanding of beliefs and messages that produce anxiety, depression, or "worries". Strategy: Identify, challenge, and replace anxious/depressive/fearful self-talk with positive, realistic, and empowering self-talk   Diagnosis:   ICD-10-CM   1. Generalized anxiety disorder  F41.1      Plan:  Patient focusing more on her symptoms today including fear and anxiety related to situations in her own life & related to her dad's health issues.  Dealing with a lot of anxiety, sadness, some unknowns and uncertainties, as another radical test is being repeated for her dad.  They have given a tentative diagnosis and wanting to get more information.  (Not all details included in this note due to patient privacy needs. )  Encouraged patient to get the rest and care that she needs right now and ask for what she needs in terms of help and support from others or from her work environment.  Also encouraged her to try to stay more in the present and  not jump ahead and make scary assumptions that may or may not be true, and instead trying to hold on in the present until more medical information has been received from the doctors involved and who are trying to pinpoint dad's diagnosis.  Patient siblings not very helpful and management of feelings and fears.  Patient trying to show some resilience as she continues to manage  stressful situations at work and now also within her family and for the sake of her father and his health status. Reminded and encouraged patient to be practicing more positive and self affirming behaviors as noted in sessions including: Being more open to making new friends and strengthening already existing friendships, interrupt negative thought patterns and replacing with more positive thoughts that do not feed patient's anxiety nor depression, stay in the present versus the past or too far into the future, stay in touch with her small group of friends more regularly, use of positive self talk, and continue her work with goal-directed behaviors to help move her in a direction that supports gradual improvement in her emotional health, her work with challenging circumstances within her family and friendships and making healthy choices not to take on extra work shifts as this all adds to her stress and anxiety.  Brianna Barr has definitely made some progress and needs to continue working with goal-directed behaviors to keep moving in a healthier direction into the future.  Goal review and progress/challenges noted with patient.  Next appointment within 2 to 3 weeks.   Brianna Patee, LCSW

## 2024-03-23 ENCOUNTER — Ambulatory Visit: Admitting: Psychiatry

## 2024-03-23 DIAGNOSIS — F411 Generalized anxiety disorder: Secondary | ICD-10-CM

## 2024-03-23 NOTE — Progress Notes (Signed)
 [facility] Counselor/Therapist Progress Note  Patient ID: Brianna Barr, MRN: [id number],    Date: 03/23/2024  Time Spent:  53 minutes  Treatment Type: Individual Therapy  Reported Symptoms:  anxiety, depression, concerned with dad's health issues    Mental Status Exam:  Appearance:   Casual and Neat     Behavior:  Appropriate, Sharing, and Motivated  Motor:  Normal  Speech/Language:   Clear and Coherent  Affect:  Depressed and anxious  Mood:  anxious and depressed  Thought process:  goal directed  Thought content:    Rumination  Sensory/Perceptual disturbances:    WNL  Orientation:  oriented to person, place, time/date, situation, day of week, month of year, year, and stated date of Mar 23, 2024  Attention:  Good  Concentration:  Good and Fair  Memory:  WNL  Fund of knowledge:   Good  Insight:    Good  Judgment:   Good  Impulse Control:  Good   Risk Assessment: Danger to Self:  No Self-injurious Behavior: No Danger to Others: No Duty to Warn:no Physical Aggression / Violence:No  Access to Firearms a concern: No  Gang Involvement:No   Subjective:   Patient presenting today in session with symptoms of anxiety, family/friend stressors and challenges, and some depression. Dad's health concerns continue and "nothing new to share on that as he is awaiting re-test in 2 wks." Dealing with a lot of frustration as they are waiting to officially diagnose Dad after the re-testing. Testing is reportedly painful physically and that is disturbing for patient to hear and processed in session today. Issues with mom/sisters. (Not all details included in this note due to patient privacy needs.)  Continues to not feel there is a clear path of know what exactly is going on for her dad. Feeling fearful and angry for Dad and talks through this more in session today. Is trying to stay more in the present and not predict the future. Couple of good friends know some more of the details  re: patient's dad and that is helpful. Friends provided birthday surprise event for 28th birthday and patient enjoyed. 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 Boundary issues continue some within church role but is starting to feel more connected and a vital part of things. Very concerned about dad's health status.   Interventions: Cognitive Behavioral Therapy and Ego-Supportive Long term goal: Reduce overall level, frequency, and intensity of the anxiety so that daily functioning is not impaired. Short term goal: Increase understanding of beliefs and messages that produce anxiety, depression, or "worries". Strategy: Identify, challenge, and replace anxious/depressive/fearful self-talk with positive, realistic, and empowering self-talk   Diagnosis:   ICD-10-CM   1. Generalized anxiety disorder  F41.1      Plan:    Patient working well today on her symptoms of anxiety, fear, and situations in her own life and her concerns regarding her dad's ongoing health issues.  Does feel she is making some progress and how she manages stress, speaks out for herself, and supporting her dad and mom through stressful times.  Feels that she "has a way to go" but has progressed in this area of strength.  Continues to confront anxiety, sadness, and the uncertainties/unknowns and showing some increase strength.  Is staying more in the present and not jumping ahead and making assumptions that may or may not be true regarding her dad's health issues.  Her siblings are not aware of the full story on dad currently and patient is trying to be helpful towards them and yet respect the privacy he is wanting currently until his next test is administered and they know the results.  Patient is showing some strength and resilience as she works to manage stressful situations within her family and also at work.  Encouraged patient in her practice of more positive and self  affirming behaviors noted in sessions and discussed further today including: Being more open to making new friends and strengthening already existing friendships, interrupt negative thought patterns and replace with more positive thoughts that do not feed her anxiety nor depression, stay in the present versus the past or too far into the future, stay in touch with her small group of friends more regularly, use of positive self talk, and continue her work with goal-directed behaviors to help move in a direction that supports gradual improvement in her emotional health, her work with challenging circumstances within her family and friendships, and making healthy choices not to take on extra work shifts as this all adds to her stress and anxiety.  Brianna Barr continues to make noticeable progress and she recognizes her need to continue working with goal-directed behaviors to help her continue moving in a healthier direction into the future.  Goal review and progress/challenges noted with patient.  Next appointment within 2 to 3 weeks.   Brianna Patee, LCSW

## 2024-03-24 ENCOUNTER — Ambulatory Visit (INDEPENDENT_AMBULATORY_CARE_PROVIDER_SITE_OTHER)

## 2024-03-24 DIAGNOSIS — M79671 Pain in right foot: Secondary | ICD-10-CM

## 2024-03-24 DIAGNOSIS — M7752 Other enthesopathy of left foot: Secondary | ICD-10-CM

## 2024-03-24 DIAGNOSIS — M2141 Flat foot [pes planus] (acquired), right foot: Secondary | ICD-10-CM

## 2024-03-24 DIAGNOSIS — M722 Plantar fascial fibromatosis: Secondary | ICD-10-CM

## 2024-03-24 NOTE — Progress Notes (Signed)
 Patient presents today to pick up custom molded foot orthotics, diagnosed with PF with pain by Dr. Wyn Heater.   Orthotics were dispensed and fit was satisfactory. Reviewed instructions for break-in and wear. Written instructions given to patient.  Patient will follow up as needed.   Britton Cane Cped, CFo, CFo   CFO's were thinned at plantar fill aspect heated as well to make more flexible as they were not allowing any pronation and increasing over supination  Patient was happy with adjustment and will call office if any problems arise

## 2024-03-29 ENCOUNTER — Ambulatory Visit (INDEPENDENT_AMBULATORY_CARE_PROVIDER_SITE_OTHER)

## 2024-03-29 DIAGNOSIS — J309 Allergic rhinitis, unspecified: Secondary | ICD-10-CM

## 2024-04-06 ENCOUNTER — Ambulatory Visit: Admitting: Psychiatry

## 2024-04-06 DIAGNOSIS — F411 Generalized anxiety disorder: Secondary | ICD-10-CM | POA: Diagnosis not present

## 2024-04-06 NOTE — Progress Notes (Signed)
 [facility] Counselor/Therapist Progress Note  Patient ID: Brianna Barr, MRN: [id number],    Date: 04/06/2024  Time Spent: 53 minutes   Treatment Type: Individual Therapy  Reported Symptoms: anxiety, depression, concerns with dad's health issues   Mental Status Exam:  Appearance:   Casual and Neat     Behavior:  Appropriate, Sharing, and Motivated  Motor:  Normal  Speech/Language:   Clear and Coherent  Affect:  Depressed and anxious  Mood:  anxious and depressed  Thought process:  goal directed  Thought content:    Rumination  Sensory/Perceptual disturbances:    WNL  Orientation:  oriented to person, place, time/date, situation, day of week, month of year, year, and stated date of April 06, 2024  Attention:  Good  Concentration:  Good  Memory:  WNL  Fund of knowledge:   Good  Insight:    Good  Judgment:   Good  Impulse Control:  Good   Risk Assessment: Danger to Self:  No Self-injurious Behavior: No Danger to Others: No Duty to Warn:no Physical Aggression / Violence:No  Access to Firearms a concern: No  Gang Involvement:No   Subjective:   Patient in session today and reporting several symptoms noted above including anxiety, depression, and family/friends stressors and challenges.Today patient is struggling significantly with father's health issues including possible cancer but has been difficult for doctors to pinpoint exactly what is going on for father, ?cancer, ALS?, etc. Patient today talking through her fears, anxieties, depression, frustration. Plan is for dad to receive injections at [facility] for next 8 weeks and after that will test to see results in dad's numbers coming down hopefully, which may lead to further injections and reassessment. Working to not assume worst-case scenarios is a challenge but making some progress. 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 States dad's health is feeding a lot of her anxiety and depression and patient states she is working on not making assumptions in a negative direction, and trying to best manage her anxiety in more positive ways.  Interventions: Cognitive Behavioral Therapy, Solution-Oriented/Positive Psychology, and Ego-Supportive Long term goal: Reduce overall level, frequency, and intensity of the anxiety so that daily functioning is not impaired. Short term goal: Increase understanding of beliefs and messages that produce anxiety, depression, or worries. Strategy: Identify, challenge, and replace anxious/depressive/fearful self-talk with positive, realistic, and empowering self-talk  Diagnosis:   ICD-10-CM   1. Generalized anxiety disorder  F41.1      Plan:  Patient in session today confronting her symptoms of fear, anxiety, situations in her own life and her concerns regarding her dad's ongoing health issues. Is feeling some better about her dad's situation however still a lot of concern as well.  Speaking out for herself more which is good to see.  Definitely supporting her dad and mom through these very stressful times and patient is paying more attention to better managing her own stress as well.  Finding some strength she did not know she had, when it comes to helping dad and the family out during these times of health concerns for dad and some unknowns.  Patient continues to try to stay in the present and not be imagining the worst assumptions into the future regarding her dad's health concerns.  Patient continues to show strength and resilience as she helps her parents through this time as well as  paying attention to her own needs.  Dad's treatments start within a couple weeks and will be several weeks in a row and then they will reevaluate per patient report.  Patient reports feeling a lot of support and has some good friends from her church especially they are supportive. Reminded  and encouraged patient in practicing more positive and self affirming behaviors noted in sessions and discuss further today including: Being more open to making new friends and strengthening already existing friendships, interrupt negative thought patterns and replace with more positive thoughts that do not feed her anxiety nor depression, stay in the present versus the past or too far into the future, remain in touch with her small group of friends more regularly, use of positive self talk, and continue her work with goal-directed behaviors that helps her keep moving in a direction that supports gradual improvement in her emotional health, her work with challenging circumstances within her family and friendships, and to keep making healthier choices not to take on extra work shifts as this adds to patient's stress and anxiety.  Natsuko Sustaita continues to work well in sessions and make noticeable progress in spite of tough situations and obstacles, and continues her work with goal-directed behaviors helping her to move and a more hopeful and healthier direction going forward.  Goal review and progress/challenges noted with patient.  Next appointment within 2 weeks.   Kelleen Patee, LCSW

## 2024-04-20 ENCOUNTER — Ambulatory Visit: Admitting: Psychiatry

## 2024-04-20 DIAGNOSIS — F411 Generalized anxiety disorder: Secondary | ICD-10-CM

## 2024-04-20 NOTE — Progress Notes (Signed)
 [facility] Counselor/Therapist Progress Note  Patient ID: Brianna Barr, MRN: [id number],    Date: 04/20/2024  Time Spent: 53 minutes   Treatment Type: Individual Therapy  Reported Symptoms: anxiety, depression, concerns about dad's health issues   Mental Status Exam:  Appearance:   Casual and Neat     Behavior:  Appropriate, Sharing, and Motivated  Motor:  Normal  Speech/Language:   Clear and Coherent  Affect:  Depressed and anxious  Mood:  anxious and depressed  Thought process:  goal directed  Thought content:    Rumination  Sensory/Perceptual disturbances:    WNL  Orientation:  oriented to person, place, time/date, situation, day of week, month of year, year, and stated date of April 20, 2024  Attention:  Good  Concentration:  Good  Memory:  WNL  Fund of knowledge:   Good  Insight:    Good  Judgment:   Good  Impulse Control:  Good   Risk Assessment: Danger to Self:  No Self-injurious Behavior: No Danger to Others: No Duty to Warn:no Physical Aggression / Violence:No  Access to Firearms a concern: No  Gang Involvement:No   Subjective:   Patient in session today working further on anxiety, depression, work-related concerns/confusion, worries about dad's health and needing to focus on these today, and be able to vent her concerns. Needed time in session today to talk through these concerns more as they are clearly bothering patient. Feeling taken advantage of in certain situations and this has clearly been hurtful for patient, as she processed this in session today. Dad's situation ok at the moment and is being treated, but they don't think it's totally cancer.  Trying to be optimistic as dad is undergoing some treatment that they hope will help. Continues to work through her anxiety, fear, depression, and frustration but because they are trying a treatment that makes me more optimistic. Trying to be more hopeful and not fear the worst. 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   Interventions: Cognitive Behavioral Therapy, Solution-Oriented/Positive Psychology, and Ego-Supportive Long term goal: Reduce overall level, frequency, and intensity of the anxiety so that daily functioning is not impaired. Short term goal: Increase understanding of beliefs and messages that produce anxiety, depression, or worries. Strategy: Identify, challenge, and replace anxious/depressive/fearful self-talk with positive, realistic, and empowering self-talk    Diagnosis:   ICD-10-CM   1. Generalized anxiety disorder  F41.1      Plan:  Patient in session today working further on her anxiety, fears, feeling less than at times, depression, and work-related concerns. Worked really well and finding herself working to have a little more self-confidence.  Confronting more effectively her fears and anxieties and particularly her anxiety and concern regarding dad's health issues.  She is however feeling better about that and is practicing speaking out more for herself in situations where she does not normally do that.  Continues to support dad and family through the stressful time for him medically.  Trying to stay more in the present and refrain from assuming worst case scenarios in multiple personal and family situations.  Patient showing some good strength and resilience, as well as confronting some issues in therapy more head-on and noticing the benefit. Reminded and encouraged patient to be practicing more positive and self affirming behaviors as discussed in sessions and further today including: Being more open to making new friends and strengthening already existing friendships, interrupt negative thought patterns and replace with more  positive thoughts that do not feed her anxiety nor depression, stay in the present versus the past or too far into the future, remain in touch with her small group of friends more regularly, use of positive self  talk, continue work with goal-directed behaviors that helps her keep moving in a direction that supports gradual improvement in her emotional health, her work with challenging circumstances within family and friendships, and to keep gaining for healthier choices including not to take on extra work shifts as this adds to her stress and anxiety.  [name] does continue to work hard in sessions and is making noticeable progress even though she is trying to manage tough situations and obstacles within her personal and family life, and continues to work with goal-directed behaviors helping her to move in a more hopeful and healthier direction into the future.  Goal review and progress/challenges noted with patient.  Next appointment within 2 to 3 weeks.   Barnie Bunde, LCSW

## 2024-04-28 ENCOUNTER — Ambulatory Visit (INDEPENDENT_AMBULATORY_CARE_PROVIDER_SITE_OTHER)

## 2024-04-28 DIAGNOSIS — J309 Allergic rhinitis, unspecified: Secondary | ICD-10-CM | POA: Diagnosis not present

## 2024-05-03 ENCOUNTER — Ambulatory Visit: Admitting: Psychiatry

## 2024-05-03 DIAGNOSIS — F411 Generalized anxiety disorder: Secondary | ICD-10-CM | POA: Diagnosis not present

## 2024-05-03 NOTE — Progress Notes (Signed)
 [facility] Counselor/Therapist Progress Note  Patient ID: Brianna Barr, MRN: [id number],    Date: 05/03/2024  Time Spent: 53 minutes   Treatment Type: Individual Therapy  Reported Symptoms: anxiety, depression, concerns about dad's health issues    Mental Status Exam:  Appearance:   Casual     Behavior:  Appropriate, Sharing, and Motivated  Motor:  Normal  Speech/Language:   Clear and Coherent  Affect:  Depressed and anxious  Mood:  anxious and depressed  Thought process:  normal  Thought content:    Rumination  Sensory/Perceptual disturbances:    WNL  Orientation:  oriented to person, place, time/date, situation, day of week, month of year, year, and stated date of May 03, 2024  Attention:  Good  Concentration:  Good  Memory:  WNL  Fund of knowledge:   Good  Insight:    Good  Judgment:   Good  Impulse Control:  Good and Fair   Risk Assessment: Danger to Self:  No Self-injurious Behavior: No Danger to Others: No Duty to Warn:no Physical Aggression / Violence:No  Access to Firearms a concern: No  Gang Involvement:No   Subjective:  Patient in for session today and focusing on anxiety, depression, worries about dad's health, work-related concerns and needing to followup more on these today. Talking through more of her concerns, anxiety, and depression some of which is related to her dad's ongoing health issues, and patient's fears. Trying to build on her prior optimism and trying to hope Dad will eventually recover and talking through some of her fears/anxieties about his health currently and into the future. Is enjoying her newer church,  and the wide variety of people she is meeting. Feeling energized by some of her contacts within the church as it continues to grow and focus on outreach. 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  These positive feelings are spilling over into her own personal life and she reports  feeling more fulfilled and that is a good feeling.  Depression level seems to have decreased some, and anxiety has become more manageable at times.   Interventions: Cognitive Behavioral Therapy, Solution-Oriented/Positive Psychology, and Ego-Supportive Long term goal: Reduce overall level, frequency, and intensity of the anxiety so that daily functioning is not impaired. Short term goal: Increase understanding of beliefs and messages that produce anxiety, depression, or worries. Strategy: Identify, challenge, and replace anxious/depressive/fearful self-talk with positive, realistic, and empowering self-talk   Diagnosis:   ICD-10-CM   1. Generalized anxiety disorder  F41.1      Plan:  Patient today working more on her fears, anxiety, depression, and concerns about her dad's health as well as some work related concerns.  Showing more strength within herself, confronting fears and anxieties more readily, and is trying not to assume the negatives regarding her dad's health issues.  Continues to fully support her dad especially during this stressful time for him medically and continues to hope for more positive outcomes.  States that she is trying not to jump ahead and make assumptions based on her worrying but instead is trying to worry less as she sees worrying does not really get her anywhere positive as noted in session today.  Showing more resilience and strength in her therapy goals. Encouraged patient to be practicing more positive and self affirming behaviors as noted in sessions and further today including: Being more open to making new friends and strengthening already existing friendships, interrupt negative  thought patterns and replace with more positive thoughts that do not feed her anxiety nor depression, stay in the present versus the past or too far into the future, remain in touch with her small group of friends more regularly, use of positive self talk, continue work with  goal-directed behaviors that helps her keep moving in a direction that supports gradual improvement in her emotional health, her work with challenging circumstances within the family and friendships, and to keep making healthier choices including not taking on extra work shifts as this adds to her stress and anxiety.  Taran Olesky continues to make progress and knows that she needs to continue her work with goal-directed behaviors as she is experiencing forward movement in a more hopeful and healthier direction in her future.  Goal review and progress/challenges noted with patient.  Next appointment within 2 to 3 weeks.   Barnie Bunde, LCSW

## 2024-05-17 ENCOUNTER — Ambulatory Visit (INDEPENDENT_AMBULATORY_CARE_PROVIDER_SITE_OTHER): Admitting: Psychiatry

## 2024-05-17 DIAGNOSIS — F411 Generalized anxiety disorder: Secondary | ICD-10-CM | POA: Diagnosis not present

## 2024-05-17 NOTE — Progress Notes (Signed)
 [facility] Counselor/Therapist Progress Note  Patient ID: Brianna Barr, MRN: [id number],    Date: 05/17/2024  Time Spent: 53 minutes   Treatment Type: Individual Therapy  Reported Symptoms: anxiety, depression, concerns about dad's health issues    Mental Status Exam:  Appearance:   Casual and Neat     Behavior:  Appropriate, Sharing, and Motivated  Motor:  Normal  Speech/Language:   Clear and Coherent  Affect:  Depressed and anxious  Mood:  anxious and depressed  Thought process:  goal directed  Thought content:    Rumination  Sensory/Perceptual disturbances:    WNL  Orientation:  oriented to person, place, time/date, situation, day of week, and month of year  Attention:  Good  Concentration:  Good  Memory:  WNL  Fund of knowledge:   Good  Insight:    Good  Judgment:   Good  Impulse Control:  Good   Risk Assessment: Danger to Self:  No Self-injurious Behavior: No Danger to Others: No Duty to Warn:no Physical Aggression / Violence:No  Access to Firearms a concern: No  Gang Involvement:No   Subjective:    Patient today in session and continuing her work on symptoms of anxiety, some depression, overthinking, worries about her father's health, depression, some fears, work-related stress and concerns including her need to set realistic boundaries. Shares that her overthinking is mostly related to dad's health conditions continuing. States today she is needing to work on her concerns related to dad's death .Trying not to assume worst case scenarios, and working further on this and her depression/anxiety/concerns for dad's health and trying not to let fear dominate her thoughts. Dad had bad episode recently and had to be re-admitted to hospital during the night. Worries about dad, but he is back home and family wanting to do a mini-vacation with whole family and hopefully dad will be able to go with them. States dad is doing some better with some of his symptoms.  Also talked through family drama today related to certain family members being treated more special than others and there's just a lot of old family drama/business that goes on within the family and patient trying to be helpful and noticing some family member improving some.    Interventions: Cognitive Behavioral Therapy, Solution-Oriented/Positive Psychology, and Ego-Supportive Long term goal: Reduce overall level, frequency, and intensity of the anxiety so that daily functioning is not impaired. Short term goal: Increase understanding of beliefs and messages that produce anxiety, depression, or worries. Strategy: Identify, challenge, and replace anxious/depressive/fearful self-talk with positive, realistic, and empowering self-talk    Diagnosis:   ICD-10-CM   1. Generalized anxiety disorder  F41.1      Plan: Patient today in session showing good effort and motivation as she continued her work on symptoms of anxiety, depression, overthinking, worrying, concerns about her dad's health, work related issues, and some fears.  Doing more strength and determination and working on her goals.  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  Very involved with family and helping oversee dad's care and doing what ever she can do to help make life a little easier for him.  Medically he seems to be doing better at times but is still having some significant issues as he goes through his treatment.  Patient showing more resilience and added strength as she continues to work on  therapy goals and moving forward particularly in her social and spiritual part of life.  Trying not to assume the negatives and hoping for more positives.  Encouraged patient in her practice of more positive and self affirming behaviors as noted in sessions including: Being more open to making new friends and  strengthening already existing friendships, interrupt negative thought patterns and replace with more positive thoughts that do not feed her anxiety nor depression, stay in the present versus the past or too far into the future, remain in touch with her small group of friends more regularly, use of positive self talk, continue work with goal-directed behaviors that helps her keep moving in a direction that supports gradual improvement in her emotional health, continue to work on better managing challenging circumstances within the family and friendships, and keep making healthier choices including not taking on extra work shifts as this adds to her stress and anxiety.  Feleshia Creasy continues to make progress and knows that she needs to keep working with goal-directed behaviors to keep moving forward in a healthier and much more hopeful direction in her future.  Goal review and progress/challenges noted with patient.  Next appointment within 2 weeks.   Barnie Bunde, LCSW

## 2024-05-24 ENCOUNTER — Ambulatory Visit (INDEPENDENT_AMBULATORY_CARE_PROVIDER_SITE_OTHER): Payer: Self-pay

## 2024-05-24 DIAGNOSIS — J309 Allergic rhinitis, unspecified: Secondary | ICD-10-CM

## 2024-05-31 ENCOUNTER — Ambulatory Visit: Admitting: Psychiatry

## 2024-06-02 NOTE — Progress Notes (Signed)
 VIALS MADE 06-02-24

## 2024-06-06 DIAGNOSIS — J3081 Allergic rhinitis due to animal (cat) (dog) hair and dander: Secondary | ICD-10-CM | POA: Diagnosis not present

## 2024-06-07 DIAGNOSIS — J3089 Other allergic rhinitis: Secondary | ICD-10-CM | POA: Diagnosis not present

## 2024-06-13 ENCOUNTER — Ambulatory Visit (INDEPENDENT_AMBULATORY_CARE_PROVIDER_SITE_OTHER): Admitting: Psychiatry

## 2024-06-13 DIAGNOSIS — F411 Generalized anxiety disorder: Secondary | ICD-10-CM

## 2024-06-13 NOTE — Progress Notes (Signed)
 [facility] Counselor/Therapist Progress Note  Patient ID: Brianna Barr, MRN: [id number],    Date: 06/13/2024  Time Spent: 55 minutes   Treatment Type: Individual Therapy  Reported Symptoms: anxiety, concern about dad's health issues, depression   Mental Status Exam:  Appearance:   Casual and Neat     Behavior:  Appropriate, Sharing, and Motivated  Motor:  Normal  Speech/Language:   Clear and Coherent  Affect:  anxiety  Mood:  anxious and depressed  Thought process:  normal  Thought content:    Rumination  Sensory/Perceptual disturbances:    WNL  Orientation:  oriented to person, place, time/date, situation, day of week, month of year, year, and stated date of Aug. 18, 2025  Attention:  Good  Concentration:  Good  Memory:  WNL  Fund of knowledge:   Good  Insight:    Good  Judgment:   Good  Impulse Control:  Good   Risk Assessment: Danger to Self:  No Self-injurious Behavior: No Danger to Others: No Duty to Warn:no Physical Aggression / Violence:No  Access to Firearms a concern: No  Gang Involvement:No   Subjective: Patient reporting in session today that her symptoms of anxiety are her primary concern today although she continues to work on other symptoms including overthinking, worrying about her father's health, some fears, need to set boundaries, and work related stress. Today needing to focus on her stress and anxiety related to work, family, and particularly Dad's current health issues.  Also worrying  about her foot problem (plantar fascitis) in both feet and Dr feeling she may need surgery, and this is a significant stressor for patient currently as her pain has worsened. Dad's health status has improved some but still somewhat iffy as he hasn't been doing better over a longer period of time. Patient sharing her concerns about possible surgery (likely surgery) and has concerns about this especially living alone and the recovery time that would be  involved. Trying not to let her fears dominate her and her thoughts. Dad has stabilized some with his symptoms and still under regular medical care. Goal review and patient working to be more understanding of herself and her needs right now.    Interventions: Cognitive Behavioral Therapy, Solution-Oriented/Positive Psychology, and Ego-Supportive Long term goal: Reduce overall level, frequency, and intensity of the anxiety so that daily functioning is not impaired. Short term goal: Increase understanding of beliefs and messages that produce anxiety, depression, or worries. Strategy: Identify, challenge, and replace anxious/depressive/fearful self-talk with positive, realistic, and empowering self-talk   Diagnosis:   ICD-10-CM   1. Generalized anxiety disorder  F41.1      Plan: Patient today in session working there on her anxiety, overthinking, fears, needed boundaries, work-related stress, and concerns about her father's health.  Stated that she really needed to focus on her stress and anxiety today which we did, particularly related to her dad's current health issues, and also as it relates to work and family relationships.  Also shared that she is having some foot issues and hoping to avoid surgery but is checking back with her doctor this week.  Good motivation and effort as she continues to work on decreasing her anxiety, depression, worrying, overthinking, and some fears.  She is showing good strength and determination in her goal-directed behaviors.  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  Continues to work hard at trying not to assume negatives in  situations and be hoping for more positives.  Reminded patient to be practicing more positive and self affirming behaviors as noted in sessions including: Being more open to making new friends and strengthening already existing friendships, interrupt negative thought patterns and  replace with more positive thoughts that do not feed her anxiety nor depression, stay in the present versus the past or too far into the future, remain in touch with her small group of friends more regularly, use of positive self talk, continue work with goal-directed behaviors that keep her moving in a direction that supports gradual improvement in her emotional health, continue to work on better managing challenging circumstances within the family and friendships, and to continue making healthier choices including not taking on extra work shifts as this adds to her stress and anxiety.  [name] does continue to make progress and she knows that she needs to keep working with goal-directed behaviors to keep moving forward in a healthier and much more optimistic direction into the future.  Goal review and progress/challenges noted with patient.  Next appointment within 2 weeks.   Barnie Bunde, LCSW

## 2024-06-14 ENCOUNTER — Ambulatory Visit: Admitting: Psychiatry

## 2024-06-20 ENCOUNTER — Other Ambulatory Visit (HOSPITAL_BASED_OUTPATIENT_CLINIC_OR_DEPARTMENT_OTHER): Payer: Self-pay

## 2024-06-22 ENCOUNTER — Telehealth: Payer: Self-pay

## 2024-06-22 NOTE — Telephone Encounter (Signed)
 Called patient and left a VM to return call, also sent My chart message with information about fasting

## 2024-06-22 NOTE — Telephone Encounter (Signed)
 Copied from CRM [id number]. Topic: Appointments - Scheduling Inquiry for Clinic >> Jun 22, 2024  9:11 AM Zy'onna H wrote: Reason for CRM:  Patient called in regarding her scheduled appointment tomorrow as it is a Annual Physical - the patient wanted to verify if she should be fasting or not to fast for her upcoming appointment and how long in between should she wait to eat  (She works overnight)   PCP/PCP Team, please advise the patient on further food/consumption details for upcoming appointment.

## 2024-06-23 ENCOUNTER — Ambulatory Visit (INDEPENDENT_AMBULATORY_CARE_PROVIDER_SITE_OTHER): Admitting: Family Medicine

## 2024-06-23 ENCOUNTER — Encounter: Payer: Self-pay | Admitting: Family Medicine

## 2024-06-23 VITALS — BP 110/74 | HR 95 | Temp 98.1°F | Ht 61.02 in | Wt 264.0 lb

## 2024-06-23 DIAGNOSIS — Z0000 Encounter for general adult medical examination without abnormal findings: Secondary | ICD-10-CM | POA: Diagnosis not present

## 2024-06-23 DIAGNOSIS — E559 Vitamin D deficiency, unspecified: Secondary | ICD-10-CM | POA: Diagnosis not present

## 2024-06-23 DIAGNOSIS — F321 Major depressive disorder, single episode, moderate: Secondary | ICD-10-CM

## 2024-06-23 DIAGNOSIS — Z131 Encounter for screening for diabetes mellitus: Secondary | ICD-10-CM

## 2024-06-23 DIAGNOSIS — F411 Generalized anxiety disorder: Secondary | ICD-10-CM | POA: Diagnosis not present

## 2024-06-23 DIAGNOSIS — Z13220 Encounter for screening for lipoid disorders: Secondary | ICD-10-CM

## 2024-06-23 LAB — LIPID PANEL
Cholesterol: 183 mg/dL (ref 0–200)
HDL: 43.5 mg/dL (ref >39.00)
LDL Cholesterol: 127 mg/dL — ABNORMAL HIGH (ref 0–99)
NonHDL: 139.87
Total CHOL/HDL Ratio: 4
Triglycerides: 62 mg/dL (ref 0.0–149.0)
VLDL: 12.4 mg/dL (ref 0.0–40.0)

## 2024-06-23 LAB — CBC WITH DIFFERENTIAL/PLATELET
Basophils Absolute: 0.1 K/uL (ref 0.0–0.1)
Basophils Relative: 0.7 % (ref 0.0–3.0)
Eosinophils Absolute: 0.2 K/uL (ref 0.0–0.7)
Eosinophils Relative: 2 % (ref 0.0–5.0)
HCT: 42.4 % (ref 36.0–46.0)
Hemoglobin: 13.7 g/dL (ref 12.0–15.0)
Lymphocytes Relative: 25.5 % (ref 12.0–46.0)
Lymphs Abs: 2.6 K/uL (ref 0.7–4.0)
MCHC: 32.4 g/dL (ref 30.0–36.0)
MCV: 82.1 fl (ref 78.0–100.0)
Monocytes Absolute: 0.7 K/uL (ref 0.1–1.0)
Monocytes Relative: 6.6 % (ref 3.0–12.0)
Neutro Abs: 6.6 K/uL (ref 1.4–7.7)
Neutrophils Relative %: 65.2 % (ref 43.0–77.0)
Platelets: 366 K/uL (ref 150.0–400.0)
RBC: 5.17 Mil/uL — ABNORMAL HIGH (ref 3.87–5.11)
RDW: 13.7 % (ref 11.5–15.5)
WBC: 10.1 K/uL (ref 4.0–10.5)

## 2024-06-23 LAB — VITAMIN B12: Vitamin B-12: 474 pg/mL (ref 211–911)

## 2024-06-23 LAB — COMPREHENSIVE METABOLIC PANEL WITH GFR
ALT: 16 U/L (ref 0–35)
AST: 19 U/L (ref 0–37)
Albumin: 4.4 g/dL (ref 3.5–5.2)
Alkaline Phosphatase: 90 U/L (ref 39–117)
BUN: 15 mg/dL (ref 6–23)
CO2: 21 meq/L (ref 19–32)
Calcium: 9.2 mg/dL (ref 8.4–10.5)
Chloride: 104 meq/L (ref 96–112)
Creatinine, Ser: 0.91 mg/dL (ref 0.40–1.20)
GFR: 85.99 mL/min (ref >60.00)
Glucose, Bld: 84 mg/dL (ref 70–99)
Potassium: 3.5 meq/L (ref 3.5–5.1)
Sodium: 138 meq/L (ref 135–145)
Total Bilirubin: 0.3 mg/dL (ref 0.2–1.2)
Total Protein: 7.9 g/dL (ref 6.0–8.3)

## 2024-06-23 LAB — TSH: TSH: 4.2 u[IU]/mL (ref 0.35–5.50)

## 2024-06-23 LAB — HEMOGLOBIN A1C: Hgb A1c MFr Bld: 5.9 % (ref 4.6–6.5)

## 2024-06-23 LAB — VITAMIN D 25 HYDROXY (VIT D DEFICIENCY, FRACTURES): VITD: 19.52 ng/mL — ABNORMAL LOW (ref 30.00–100.00)

## 2024-06-23 LAB — T4, FREE: Free T4: 0.9 ng/dL (ref 0.60–1.60)

## 2024-06-23 NOTE — Progress Notes (Signed)
 Established Patient Office Visit   Subjective  Patient ID: Brianna Barr, female    DOB: [birth year]  Age: 28 y.o. MRN: [id number]  Chief Complaint  Patient presents with   Annual Exam    Pt is a 28 yo female seen for CPE. Pt states she is doing ok for the most part.  Was seeing Cards for h/o elevated Lpa and fam hx.  Told to continue diet changes.  No recent f/u.  Only noticing palpitations with anxiety/panic attacks.  In counseling.  Seeing allergist.  Plans to start GLP-1 next wk for wt loss.  Inquires about additional labs that may be needed.  No longer anemic since having IUD in place.  Has been able to donate blood without issue.  Pt mentions wanting to get a COVID vaccine if a new version is available though listed as an allergy .  Pt developed cellulitis after her last COVID vaccine which resolved with abx.      Patient Active Problem List   Diagnosis Date Noted   Seasonal and perennial allergic rhinitis 02/18/2024   Allergic rhinitis due to allergen 08/20/2023   Allergic conjunctivitis of both eyes 08/20/2023   Anxiety and depression 07/07/2019   Mild intermittent asthma, uncomplicated 07/07/2019   Seasonal allergies 07/07/2019   Plantar fasciitis 07/07/2019   Hyperhidrosis 07/07/2019   OAB (overactive bladder) 07/07/2019   Allergy  08/06/2018   Past Medical History:  Diagnosis Date   Anxiety    Asthma    Depression    Overactive bladder    Stress incontinence    Past Surgical History:  Procedure Laterality Date   WISDOM TOOTH EXTRACTION     Social History   Tobacco Use   Smoking status: Never    Passive exposure: Never   Smokeless tobacco: Never  Vaping Use   Vaping status: Never Used  Substance Use Topics   Alcohol use: No   Drug use: No   Family History  Problem Relation Age of Onset   Multiple sclerosis Mother    Hypertension Father    Hypertension Maternal Grandmother    Diabetes Maternal Grandmother    Heart disease Maternal Grandmother     Cervical cancer Maternal Grandmother    Cirrhosis Maternal Grandfather    Stroke Paternal Grandmother    Heart disease Paternal Grandmother    Hypertension Paternal Grandmother    Diabetes Paternal Grandmother    Endometrial cancer Paternal Grandmother    Stroke Paternal Grandfather    Colon cancer Neg Hx    Colon polyps Neg Hx    Pancreatic cancer Neg Hx    Esophageal cancer Neg Hx    Liver disease Neg Hx    Stomach cancer Neg Hx    Allergies  Allergen Reactions   Covid-19 (Mrna Bivalent) Vaccine Proofreader) [Covid-19 (Mrna) Vaccine] Other (See Comments)    Arm pain, stiffness in arm and neck, redness and swelling of arm, severe itching    ROS Negative unless stated above    Objective:     BP 110/74   Pulse 95   Temp 98.1 F (36.7 C) (Oral)   Ht 5' 1.02 (1.55 m)   Wt 264 lb (119.7 kg)   SpO2 99%   BMI 49.84 kg/m  BP Readings from Last 3 Encounters:  06/23/24 110/74  02/23/24 109/74  02/18/24 124/64   Wt Readings from Last 3 Encounters:  06/23/24 264 lb (119.7 kg)  02/23/24 261 lb (118.4 kg)  02/18/24 263 lb (119.3 kg)  Physical Exam Constitutional:      Appearance: Normal appearance. She is obese.  HENT:     Head: Normocephalic and atraumatic.     Right Ear: Tympanic membrane, ear canal and external ear normal.     Left Ear: Tympanic membrane, ear canal and external ear normal.     Nose: Nose normal.     Mouth/Throat:     Mouth: Mucous membranes are moist.     Pharynx: No oropharyngeal exudate or posterior oropharyngeal erythema.  Eyes:     General: No scleral icterus.    Extraocular Movements: Extraocular movements intact.     Conjunctiva/sclera: Conjunctivae normal.     Pupils: Pupils are equal, round, and reactive to light.  Neck:     Thyroid : No thyromegaly.     Vascular: No carotid bruit.  Cardiovascular:     Rate and Rhythm: Normal rate and regular rhythm.     Pulses: Normal pulses.     Heart sounds: Normal heart sounds. No murmur  heard.    No friction rub.  Pulmonary:     Effort: Pulmonary effort is normal.     Breath sounds: Normal breath sounds. No wheezing, rhonchi or rales.  Abdominal:     General: Bowel sounds are normal.     Palpations: Abdomen is soft.     Tenderness: There is no abdominal tenderness.  Musculoskeletal:        General: No deformity. Normal range of motion.  Lymphadenopathy:     Cervical: No cervical adenopathy.  Skin:    General: Skin is warm and dry.     Findings: No lesion.     Comments: Acanthosis nigricans.  Neurological:     General: No focal deficit present.     Mental Status: She is alert and oriented to person, place, and time.  Psychiatric:        Attention and Perception: Attention normal.        Mood and Affect: Mood and affect normal.        Speech: Speech normal.        Behavior: Behavior normal. Behavior is cooperative.        Thought Content: Thought content normal.        06/23/2024   10:27 AM 06/22/2023    8:44 AM 05/12/2023   10:42 AM  Depression screen PHQ 2/9  Decreased Interest 1 1 1   Down, Depressed, Hopeless 2 1 1   PHQ - 2 Score 3 2 2   Altered sleeping 3 3 3   Tired, decreased energy 3 2 2   Change in appetite 1 1 1   Feeling bad or failure about yourself  1 1 1   Trouble concentrating 1 1 1   Moving slowly or fidgety/restless 0 1 0  Suicidal thoughts 0 0 0  PHQ-9 Score 12 11 10   Difficult doing work/chores Somewhat difficult Somewhat difficult       06/23/2024   10:27 AM 06/22/2023    8:45 AM 05/12/2023   10:43 AM 04/13/2023    9:43 AM  GAD 7 : Generalized Anxiety Score  Nervous, Anxious, on Edge 3 3 3 3   Control/stop worrying 2 2 3 3   Worry too much - different things 2 3 3 3   Trouble relaxing 2 2 2 3   Restless 1 1 1 2   Easily annoyed or irritable 1 1 1 2   Afraid - awful might happen 2 1 2 3   Total GAD 7 Score 13 13 15 19   Anxiety Difficulty Somewhat difficult Very difficult  Very difficult     No results found for any visits on  06/23/24.    Assessment & Plan:   Well adult exam -     CBC with Differential/Platelet; Future -     Comprehensive metabolic panel with GFR; Future -     Hemoglobin A1c; Future -     Lipid panel; Future -     T4, free; Future -     TSH; Future  Depression, major, single episode, moderate (HCC) -     T4, free; Future -     TSH; Future  Vitamin D  deficiency -     VITAMIN D  25 Hydroxy (Vit-D Deficiency, Fractures); Future  GAD (generalized anxiety disorder)  Morbid obesity (HCC) -     CBC with Differential/Platelet; Future -     Comprehensive metabolic panel with GFR; Future -     Hemoglobin A1c; Future -     Lipid panel; Future -     T4, free; Future -     TSH; Future -     Vitamin B12; Future -     VITAMIN D  25 Hydroxy (Vit-D Deficiency, Fractures); Future  Age appropriate health screenings discussed.  Obtain labs.  Immunizations reviewed.  Upon review it appears that pt had cellulitis after last COVID vaccine which is not a true allergy .  Ok to get update vaccine if desired.  Has an Epi pen.  Pap up to date, done 01/16/22, due 2026.  Continue lifestyle modifications.  Body mass index is 49.84 kg/m.  PHQ 9 score 12, GAD 7 score 13.  Seems in a happier mood this visit.  Continue current meds and f/u with BH.   Return in about 1 year (around 06/23/2025) for physical.   Clotilda JONELLE Single, MD

## 2024-06-24 ENCOUNTER — Ambulatory Visit (INDEPENDENT_AMBULATORY_CARE_PROVIDER_SITE_OTHER): Payer: Self-pay

## 2024-06-24 DIAGNOSIS — J309 Allergic rhinitis, unspecified: Secondary | ICD-10-CM

## 2024-06-30 ENCOUNTER — Ambulatory Visit: Admitting: Psychiatry

## 2024-06-30 DIAGNOSIS — F411 Generalized anxiety disorder: Secondary | ICD-10-CM

## 2024-06-30 NOTE — Progress Notes (Signed)
 [facility] Counselor/Therapist Progress Note  Patient ID: Brianna Barr, MRN: [id number],    Date: 06/30/2024  Time Spent: 53 minutes   Treatment Type: Individual Therapy  Reported Symptoms: anxiety, concern about dad's health issues, depression improving     Mental Status Exam:  Appearance:   Casual     Behavior:  Appropriate, Sharing, and Motivated  Motor:  Normal  Speech/Language:   Clear and Coherent  Affect:  Anxious, some depression  Mood:  anxious and depressed  Thought process:  normal  Thought content:    Rumination  Sensory/Perceptual disturbances:    WNL  Orientation:  oriented to person, place, time/date, situation, day of week, month of year, year, and stated date of Sept. 4, 2025  Attention:  Good  Concentration:  Good  Memory:  WNL  Fund of knowledge:   Good  Insight:    Good  Judgment:   Good  Impulse Control:  Good   Risk Assessment: Danger to Self:  No Self-injurious Behavior: No Danger to Others: No Duty to Warn:no Physical Aggression / Violence:No  Access to Firearms a concern: No  Gang Involvement:No   Subjective: Patient today reports symptoms of anxiety, overthinking, worrying about her father's health, some fears, needing to maintain boundaries, and work-related stress. Very concerned about her Dad, who hadn't been getting worse although not getting much worse, however he has new symptoms that he is having checked out. Expressing her anxiety and some depression, especially her anxiety and frustration for her father. Sad about her longtime dog who is 12yrs old and developing more physical issues and fears he may not live much longer which reminds me of conversations with family members about her father's health concerns and not finding much that helps. Processing fears and sadness today about dad's health condition and her dog whose health is also failing, and talked more openly about her sadness and fears about her dad and dog. Continues  working with her plantar fascitis and doctor has discussed some possible next steps if patient does not experience more healing.  Patient continuing to work on managing her fears and healthy ways and trying not to let it overwhelm her, and following through with coping strategies discussed in session.   Interventions: Cognitive Behavioral Therapy, Solution-Oriented/Positive Psychology, and Ego-Supportive Long term goal: Reduce overall level, frequency, and intensity of the anxiety so that daily functioning is not impaired. Short term goal: Increase understanding of beliefs and messages that produce anxiety, depression, or worries. Strategy: Identify, challenge, and replace anxious/depressive/fearful self-talk with positive, realistic, and empowering self-talk   Diagnosis:   ICD-10-CM   1. Generalized anxiety disorder  F41.1      Plan: Patient today showing really good motivation and working further on confronting her fears, her overthinking, anxiety, establishing healthier boundaries, work-related stressors and particularly concerns about her father's health and her 38 year old aging dog's health.  Assessing a lot of her stress, anxiety, sadness, and realizing she cannot fix things but can work on having good support in place and taking care of herself and ways that she knows are helpful.  Showing really good motivation in session today as she worked with her symptoms and also trying not to overthink.  Remains very involved in her church which is also supportive and offers a variety of activities that are helpful to patient.  Encouraged her to continue and trying not to assume negatives and have more faith in herself as she confronts difficult situations as discussed in session  today.  Encouraged patient to be practicing more positive and self affirming behaviors as discussed in sessions including: Be more open to making new friends and strengthening already existing friendships which she has  begun to do more within her church, interrupt negative thought patterns and replace with more positive thoughts that do not feed her anxiety and or depression, remain in the present versus a past or too far into the future, stay in touch with her small group of friends more regularly, use of positive self-talk, continue work with goal-directed behaviors that keep her moving in a direction that supports gradual improvement in her emotional health, continue to work on better managing challenging circumstances within the family and friendships, and continue making healthier choices including not taking on extra work shifts as this adds to her stress and anxiety.  Brianna Barr continues to work on her goals and has shown progress, and she realizes that she does need to keep working with her goal-directed behaviors to keep moving forward and a healthier and more hopeful and positive direction into the future.  Goal review and progress/challenges noted with patient.  Next appointment within 2 weeks.   Barnie Bunde, LCSW

## 2024-07-07 ENCOUNTER — Other Ambulatory Visit (HOSPITAL_BASED_OUTPATIENT_CLINIC_OR_DEPARTMENT_OTHER): Payer: Self-pay

## 2024-07-07 ENCOUNTER — Ambulatory Visit: Payer: Self-pay | Admitting: Family Medicine

## 2024-07-07 DIAGNOSIS — E559 Vitamin D deficiency, unspecified: Secondary | ICD-10-CM

## 2024-07-07 MED ORDER — VITAMIN D (ERGOCALCIFEROL) 1.25 MG (50000 UNIT) PO CAPS
50000.0000 [IU] | ORAL_CAPSULE | ORAL | 0 refills | Status: AC
  Filled 2024-07-07 (×2): qty 12, 84d supply, fill #0

## 2024-07-07 NOTE — Addendum Note (Signed)
 Addended by: BRIEN SONG A on: 07/07/2024 11:47 AM   Modules accepted: Orders

## 2024-07-11 ENCOUNTER — Ambulatory Visit: Admitting: Psychiatry

## 2024-07-14 ENCOUNTER — Ambulatory Visit: Admitting: Psychiatry

## 2024-07-14 DIAGNOSIS — F411 Generalized anxiety disorder: Secondary | ICD-10-CM | POA: Diagnosis not present

## 2024-07-14 NOTE — Progress Notes (Signed)
 [facility] Counselor/Therapist Progress Note  Patient ID: Ioana Dionne Rensch, MRN: [id number],    Date: 07/14/2024  Time Spent: 55 minutes   Treatment Type: Individual Therapy  Reported Symptoms: anxiety, concern with dad's health issues, depression    Mental Status Exam:  Appearance:   Casual and Neat     Behavior:  Appropriate, Sharing, and Motivated  Motor:  Normal  Speech/Language:   Clear and Coherent  Affect:  Depressed and anxiety  Mood:  anxious and depressed  Thought process:  normal  Thought content:    Rumination  Sensory/Perceptual disturbances:    WNL  Orientation:  oriented to person, place, time/date, situation, day of week, month of year, year, and stated date of Sept 18, 2025  Attention:  Good  Concentration:  Good  Memory:  WNL  Fund of knowledge:   Good  Insight:    Good  Judgment:   Good  Impulse Control:  Good   Risk Assessment: Danger to Self:  No Self-injurious Behavior: No Danger to Others: No Duty to Warn:no Physical Aggression / Violence:No  Access to Firearms a concern: No  Gang Involvement:No   Subjective:   Patient in session today and working further on her overthinking, anxiety, fears, concerns for her father's health, working to have good boundaries, work-related stresses, and concerns about her aging dog's increased health issues. Still concerned about dad's health concerns and the health problems her dog is having. Needed session to vent and share her anxious, sad, fearful thoughts mostly regarding her father's current health concerns. Doctors considering a different type of treatment for father that may be helpful but also may prove to not be helpful. Is connected with her sisters although not excessively close. Trying to balance work life, home life, and social life which I'm trying to improve upon but sometimes I struggle not having much time for others. Is speaking up more within  and around her friends, coworkers. Concerns  especially with Dad and being present for him. Does try to capitalize on the time she does have to be with dad. Continues to not feel that he is worse medically but also that there is hope and fears that she has for him, as processed in session today. Self-care stressed with patient today, and she appreciates some flexibility within her work group and covering for each other.  Her job today in session and patient talking and working through a lot of stress, anxiety, some sadness, and fears.  Seemed more calm and hopeful as she left today.  Interventions: Cognitive Behavioral Therapy, Solution-Oriented/Positive Psychology, and Ego-Supportive Long term goal: Reduce overall level, frequency, and intensity of the anxiety so that daily functioning is not impaired. Short term goal: Increase understanding of beliefs and messages that produce anxiety, depression, or worries. Strategy: Identify, challenge, and replace anxious/depressive/fearful self-talk with positive, realistic, and empowering self-talk   Diagnosis:   ICD-10-CM   1. Generalized anxiety disorder  F41.1      Plan:   Patient openly working further today in session on her overthinking, anxiety, continued concerns about her father's health issues, establishing healthier boundaries, fears, work related stressors, and concerns about her 44 year old aging dog's health issues.  Worked consistently well in session today with more realization that she cannot fix everything but can and does need to keep in mind her support system and not hesitate to reach out to them, just as she is part of a support system for her friends.  Involvement in her church  is very helpful and also another support system for patient.  Her coworkers also seem to be a cohesive unit and able to help each other out when schedules change or they need to switch days off etc. continue to encourage patient in not assuming that negative things are going to happen and I have more  faith in herself as she continues to confront sensitive/difficult situations especially within her family. Continue to encourage patient in her practice of more positive and self affirming behaviors as discussed in sessions including: Being more open to making new friends and strengthening already existing friendships which she is doing on some occasions more so within her church, interrupt negative thought patterns and replace with more positive thoughts that do not feed her anxiety or depression, remain in the present versus in the past or too far into the future, stay in touch with her small group of friends more regularly, use of positive self-talk, continue work with goal-directed behaviors that keep her moving in a direction that supports gradual improvement in her emotional health, continue to work on better managing challenging circumstances within the family and friendships, and work to make healthier choices including not taking on extra work shifts as this adds to her stress and anxiety.  Leilani Pitner continues to focus on her goals and has definitely shown some progress.  She realizes that she does need to keep working with specific goal-directed behaviors so as to continue moving forward in a more hopeful and healthier direction into the future.  Goal review and progress/challenges noted with patient.  Next appointment within 2 weeks.   Barnie Bunde, LCSW

## 2024-07-22 ENCOUNTER — Ambulatory Visit (INDEPENDENT_AMBULATORY_CARE_PROVIDER_SITE_OTHER): Payer: Self-pay

## 2024-07-22 DIAGNOSIS — J309 Allergic rhinitis, unspecified: Secondary | ICD-10-CM | POA: Diagnosis not present

## 2024-07-27 ENCOUNTER — Ambulatory Visit: Admitting: Psychiatry

## 2024-07-27 DIAGNOSIS — F411 Generalized anxiety disorder: Secondary | ICD-10-CM

## 2024-07-27 NOTE — Progress Notes (Signed)
 [facility] Counselor/Therapist Progress Note  Patient ID: Brianna Barr, MRN: [id number],    Date: 07/27/2024  Time Spent: 55 minutes   Treatment Type: Individual Therapy  Reported Symptoms: anxiety, depression, concern with dad's health issues   Mental Status Exam:  Appearance:   Casual     Behavior:  Appropriate, Sharing, and Motivated  Motor:  Normal  Speech/Language:   Clear and Coherent  Affect:  Depressed and anxious  Mood:  anxious and depressed  Thought process:  goal directed  Thought content:    Rumination  Sensory/Perceptual disturbances:    WNL  Orientation:  oriented to person, place, time/date, situation, day of week, month of year, year, and stated date of Oct. 1, 2025  Attention:  Good  Concentration:  Good  Memory:  WNL  Fund of knowledge:   Good  Insight:    Good and Fair  Judgment:   Good  Impulse Control:  Good   Risk Assessment: Danger to Self:  No Self-injurious Behavior: No Danger to Others: No Duty to Warn:no Physical Aggression / Violence:No  Access to Firearms a concern: No  Gang Involvement:No   Subjective: Patient today reporting symptoms of anxiety, some depression, overthinking, some fears for father's health and not much change recently, trying to have better boundaries, work stressors, and concerns with her aging dog's health issues.  No changes in dad's health status, and patient remains concerned. Some re-testing for father coming up soon. Work continues to be stressful and is try to better balance her home life, work life, and socially. Feeling some increased hope about father's health challenges and that she is cautiously optimistic about his situation. Some improvement. Relationship with sisters ok, although got into argument with younger sister recently on vacation which they were able to resolve. Processed some issues she is having with younger sister who doesn't like to take responsibility for some of her actions and  shared/worked on a couple examples in session today. Concerns in relationship with younger sister and sharing some recent stressors between the 2 of them, as well as looking at alternative ways of managing some of the stress she feels relationally within family. Continues to be on injections re: weight loss med, and has lost about 13lbs in 4 wks. Feeling more hopeful about dad's health. Self-care strategies reviewed as she continues to balance, work/family/home/social life.  Appears less worried today and that seems to be related to dad's leveling out more recently and no increased symptoms which have been happening earlier.  Job stressors processed in session today and she feels she is managing them as well as possible.  Did appear more calm and hopeful today upon leaving.   Interventions: Cognitive Behavioral Therapy, Solution-Oriented/Positive Psychology, and Ego-Supportive Long term goal: Reduce overall level, frequency, and intensity of the anxiety so that daily functioning is not impaired. Short term goal: Increase understanding of beliefs and messages that produce anxiety, depression, or worries. Strategy: Identify, challenge, and replace anxious/depressive/fearful self-talk with positive, realistic, and empowering self-talk   Diagnosis:   ICD-10-CM   1. Generalized anxiety disorder  F41.1      Plan:   Patient today continuing her work on managing concerns about her father's health which seems to be a little improved recently, her overthinking, her anxiety, having healthier boundaries within the family and beyond, managing work-related stressors and appreciating the time she has with her 13 year old aging dog and more recently has had some health concerns but states that this past week  it was not as noticeable.  Good support system within her friends.  Feels that the involvement within her church is positive and motivating for her and also provides another social support system for patient  to be involved in.  Continues to work on not assuming the negatives and having more faith in herself as she confronts difficult/sensitive issues within the family.  Reminded and encouraged patient to be practicing more of the positive and self affirming behaviors as discussed in sessions including: Interrupt negative thought patterns and replace with more positive thoughts that do not feed her anxiety nor depression, being more open to making new friends and strengthening already existing friendships which she does on some occasions especially within her church, stay in the present versus the past or too far into the future, stay in touch with her small group of friends more regularly, positive self-talk, continue work with goal-directed behaviors that keeps her moving in a direction that supports gradual improvement of her emotional health, continue to work on better managing challenging circumstances within the family and friendships, and work to make healthier choices including not taking on extra work shifts since this has proven to add to her stress and anxiety previously.  Brianna Barr continues her focus on her goals and definitely is showing progress.  She does realize that she needs to keep working with specific goal-directed behaviors so that she can continue to move forward in a hopeful and healthier direction now and in the future.    Goal review and progress/challenges noted with patient.  Next appointment within 2 weeks.   Barnie Bunde, LCSW

## 2024-08-10 ENCOUNTER — Ambulatory Visit (INDEPENDENT_AMBULATORY_CARE_PROVIDER_SITE_OTHER): Admitting: Psychiatry

## 2024-08-10 DIAGNOSIS — F411 Generalized anxiety disorder: Secondary | ICD-10-CM

## 2024-08-10 NOTE — Progress Notes (Signed)
 [facility] Counselor/Therapist Progress Note  Patient ID: Brianna Barr, MRN: [id number],    Date: 08/10/2024  Time Spent: 53 minutes   Treatment Type: Individual Therapy  Reported Symptoms: Anxiety, depression, concern with dad's health issues   Mental Status Exam:  Appearance:   Neat     Behavior:  Appropriate, Sharing, and Motivated  Motor:  Normal  Speech/Language:   Clear and Coherent  Affect:  Depressed and anxious  Mood:  anxious and depressed  Thought process:  goal directed  Thought content:    Rumination  Sensory/Perceptual disturbances:    WNL  Orientation:  oriented to person, place, time/date, situation, day of week, month of year, year, and stated date of Oct. 15, 2025  Attention:  Good  Concentration:  Good and Fair  Memory:  WNL  Fund of knowledge:   Good  Insight:    Good  Judgment:   Good  Impulse Control:  Good   Risk Assessment: Danger to Self:  No Self-injurious Behavior: No Danger to Others: No Duty to Warn:no Physical Aggression / Violence:No  Access to Firearms a concern: No  Gang Involvement:No   Subjective:    Patient in session today showing good motivation and she continues to work on her overthinking, anxiety, some depression, fears for her father's health, trying to have better boundaries, work stressors, and concerns regarding her aging dog. Seems more hopeful in session today. Her little dog is showing some progress. Enjoyed having some family pictures made recently. Dad's health issues continue but stable at the moment, with upcoming biopsy scheduled soon. Issues with required getting of upcoming flu shot and covid shot, due to problems in past with her previous BF and his illness and eventual death. Processed this more in our session today which did seem to help patient sort through her thoughts and feelings related to the shots. Also working more today on her life within my church and relationships there, self-care,  relationships within her family. Realizing more of her need to ask for help when needed and not take certain things too seriously. Also realized how asking for help, tending to judge herself, and often take things too seriously and focused more on this in session today as she wants to decrease the self judging, and feel better about asking for help when needed.  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  Continues to feel some increased hope about her father's health challenges as things have seemed a little more stable recently although no further treatments nor positive reports to indicate that but also no additional challenges.  Continues and trying to balance her work life, home life, church life, and social life.  Has gotten more involved in her church and that seems to be having a positive impact on patient as well.  Continues with her weight loss injections which seem to be having a positive impact on her physically.  Admits that she is feeling a little more hopeful about dad's health but also very cautious.  Further work on self-care strategies for patient especially in balancing her work life, home life, and social life.  Processing some job stressors today and feels that she manages them as well as possible.  Seems a little more upbeat today and some decrease in depression.   Interventions: Cognitive Behavioral Therapy, Solution-Oriented/Positive Psychology, and Ego-Supportive Long term  goal: Reduce overall level, frequency, and intensity of the anxiety so that daily functioning is not impaired. Short term goal: Increase understanding of beliefs and messages that produce anxiety, depression, or worries. Strategy: Identify, challenge, and replace anxious/depressive/fearful self-talk with positive, realistic, and empowering  self-talk   Diagnosis:   ICD-10-CM   1. Generalized anxiety disorder  F41.1      Plan:  Patient today working further on trying to better manage her anxiety re: father's health and some improvement noted.  As noted above, seems to be making some progress and her behavior and affect seem to reflect more of an upbeat feeling today at times.  Continues to work on her anxiety and depression and there seem to be some decrease in that depression today.  Is feeling encouraged that her aging dog has not been as sick more recently and hoping that this continues.  Trying to best manage work and family stressors while keeping in touch with friends and involved in her activities at church which she enjoys.  Seems to be having more faith in herself and believing in her ability to manage difficult situations articulate within her family.  Encouraged patient in her practice of more positive and self affirming behaviors as discussed in sessions including: Interrupt negative thought patterns and replace with more positive thoughts that do not feed her anxiety nor depression, being more open to making new friends and strengthening already existing friendships which she does on some occasions especially within her church, stay in the present versus the past 4-4 into the future, stay in touch with her small group of friends more regularly, use of positive self-talk, continue work with goal-directed behaviors that keeps her moving in a direction that supports gradual improvement of her emotional health, continue to work on better managing challenging circumstances within the family and friendships, and work to make healthier choices including not taking on extra work shifts since this has proven to add to her stress and anxiety previously.  Candida Swails continues her work on her treatment goals and is definitely showing progress.  She realizes that she needs to keep working with specific goal-directed behaviors that can help  lead her forward in a more hopeful and healthier direction now and in the future.  Goal review and progress/challenges noted with patient.  Next appointment within 2 to 3 weeks.   Barnie Bunde, LCSW

## 2024-08-16 ENCOUNTER — Encounter: Payer: Self-pay | Admitting: Podiatry

## 2024-08-16 ENCOUNTER — Ambulatory Visit (INDEPENDENT_AMBULATORY_CARE_PROVIDER_SITE_OTHER): Admitting: Podiatry

## 2024-08-16 DIAGNOSIS — M722 Plantar fascial fibromatosis: Secondary | ICD-10-CM

## 2024-08-16 DIAGNOSIS — M6289 Other specified disorders of muscle: Secondary | ICD-10-CM

## 2024-08-16 DIAGNOSIS — M79671 Pain in right foot: Secondary | ICD-10-CM

## 2024-08-16 MED ORDER — TRIAMCINOLONE ACETONIDE 10 MG/ML IJ SUSP
10.0000 mg | Freq: Once | INTRAMUSCULAR | Status: AC
  Administered 2024-08-16: 10 mg

## 2024-08-16 NOTE — Progress Notes (Signed)
 Chief Complaint  Patient presents with   Plantar Fasciitis    Bilateral heel pain. 7 pain. Non diabetic. Pt. Has custom orthotics and 5 injections.60% better with orthotics.    HPI: 28 y.o. female presents today for follow-up of bilateral plantar fasciitis.  She was last seen in March 2025 in which she received bilateral cortisone injections and custom orthotics.  She does feel that she is overall 60% better but still has lingering pain.  She works in [employer] and is on her feet working third shift.  States that the right hurts more than the left.  Denies any injury.  She was worried that we were going to pursue surgery today.  Past Medical History:  Diagnosis Date   Anxiety    Asthma    Depression    Overactive bladder    Stress incontinence    Past Surgical History:  Procedure Laterality Date   WISDOM TOOTH EXTRACTION     Allergies  Allergen Reactions   Covid-19 (Mrna Bivalent) Vaccine Proofreader) [Covid-19 (Mrna) Vaccine] Other (See Comments)    Arm pain, stiffness in arm and neck, redness and swelling of arm, severe itching     Physical Exam: Palpable pedal pulses bilateral.  No significant edema is appreciated.  No areas of ecchymosis or erythema are noted.  There is pain on palpation to the plantar medial aspect of the bilateral heel with the right being more severe than the left.  Ankle dorsiflexion is less than 7 degrees with the knee extended bilateral.  She does have very tight gastrosoleus complex.  Minimal discomfort in the instep.  No pain on palpation of the posterior tibial tendon bilateral.  Epicritic sensation intact  Assessment/Plan of Care: 1. Plantar fasciitis   2. Pain in both feet   3. Tightness of both gastrocnemius muscles     AMB REFERRAL TO PHYSICAL THERAPY  Discussed findings and typical treatment options for plantar fasciitis with the patient today.  I would recommend physical therapy at this time since her pain has been chronic over the past 5 years.   She has seen one of our other providers during that time and this is our second visit together.  Referral made for physical therapy to address the chronic plantar fasciitis and also to address the tight gastrosoleus complex.  With the patient's consent, bilateral cortisone injections were administered to the plantar heels consisting of a mixture of 1% lidocaine  plain, 0.5% Marcaine  plain, and Kenalog  10 for total of 1.5 cc administered to each heel.  She tolerated these well and a Band-Aid was applied.  She can remove this later today.  Hold off for 2 days before resuming stretching exercises.  As she wears her orthotics in her work shoes and states that it does feel that her ankles are rolling out.  She was given some felt padding to create a lateral wedge on her orthotic to see if this helps.  She was shown how to do this on her regular shoe insole today.  Instructed her on how to put this on her files at home and if this actually provides significant improvement, she can follow-up with our pedorthist to have this permanently incorporated into her orthotics.  Follow-up in 5 to 6 weeks after PT   Theoplis Garciagarcia DSABRA [place], DPM, FACFAS [facility]     [address]  [place], [place] [address]                Office [phone]  Fax [phone]

## 2024-08-19 ENCOUNTER — Other Ambulatory Visit (HOSPITAL_BASED_OUTPATIENT_CLINIC_OR_DEPARTMENT_OTHER): Payer: Self-pay

## 2024-08-19 ENCOUNTER — Ambulatory Visit (INDEPENDENT_AMBULATORY_CARE_PROVIDER_SITE_OTHER): Admitting: Internal Medicine

## 2024-08-19 ENCOUNTER — Encounter: Payer: Self-pay | Admitting: Internal Medicine

## 2024-08-19 ENCOUNTER — Ambulatory Visit: Payer: Self-pay | Admitting: *Deleted

## 2024-08-19 VITALS — BP 118/68 | HR 84 | Temp 98.0°F | Resp 18 | Wt 255.0 lb

## 2024-08-19 DIAGNOSIS — J309 Allergic rhinitis, unspecified: Secondary | ICD-10-CM

## 2024-08-19 DIAGNOSIS — J302 Other seasonal allergic rhinitis: Secondary | ICD-10-CM

## 2024-08-19 DIAGNOSIS — J3089 Other allergic rhinitis: Secondary | ICD-10-CM | POA: Diagnosis not present

## 2024-08-19 DIAGNOSIS — J4520 Mild intermittent asthma, uncomplicated: Secondary | ICD-10-CM | POA: Diagnosis not present

## 2024-08-19 MED ORDER — BUDESONIDE-FORMOTEROL FUMARATE 160-4.5 MCG/ACT IN AERO
INHALATION_SPRAY | RESPIRATORY_TRACT | 5 refills | Status: AC
Start: 2024-08-19 — End: ?
  Filled 2024-08-19: qty 10.2, 30d supply, fill #0

## 2024-08-19 NOTE — Progress Notes (Signed)
 FOLLOW UP Date of Service/Encounter:   08/19/2024  Subjective:  Brianna Barr (DOB: 1995-11-08) is a 28 y.o. female who [facility] in re-evaluation of the following: Allergic rhinitis on AIT and asthma History obtained from: chart review and patient.  For Review, LV was on 02/18/2024 with Dr.Jonai Weyland seen for routine follow-up. See below for summary of history and diagnostics.   Therapeutic plans/changes recommended: Doing well on AIT, FEV1 83% ----------------------------------------------------- Pertinent History/Diagnostics:  Asthma: Triggered by allergies. Takes singulair . - nonobstructive patter spirometry (02/16/23)-FEV1 82% Allergic Rhinitis:  sneezing, runny nose, itchy eyes. Symptoms are year-round but worse in the spring and fall  Current meds: allegra or xyzal , pataday  PRN, ryaltris  2 SEN BID PRN AIT RUSH started 04/24/23, maintenance reached 08/26/23. - SPT environmental panel (02/16/23): positive to grasses, ragweed, weeds, trees, indoor molds, outdoor molds, dust mites, and cat.  --------------------------------------------------- Today presents for follow-up. Discussed the use of AI scribe software for clinical note transcription with the patient, who gave verbal consent to proceed.  History of Present Illness Brianna Barr is a 28 year old female who presents with symptoms suggestive of a dog allergy .  Allergic rhinitis and conjunctivitis symptoms - Rhinorrhea, pruritus of the eyes, and sneezing occur with exposure to dogs - Symptoms initially began while residing with her parents, who owned a dog - Symptoms persist during visits to her parents' home, especially when not on allergy  medications except for injections -Dog is not in her current allergy  injection vials  Allergy  medication use - Currently not taking any allergy  medications other than on days she comes for allergy  injections - Previously used  Montelukast , which was discontinued without recurrence of symptoms - Has antihistamines and Olopatadine  available for use - Has a prescription for Ryaltris  nasal spray but has not used it  Asthma and respiratory exacerbations - History of respiratory issues, including pneumonia in March requiring rescue inhaler and prednisone  - Respiratory illnesses often trigger asthma exacerbations, necessitating additional treatment - No recent use of rescue inhaler - No need for steroids or antibiotics since March   All medications reviewed by clinical staff and updated in chart. No new pertinent medical or surgical history except as noted in HPI.  ROS: All others negative except as noted per HPI.   Objective:  BP 118/68   Pulse 84   Temp 98 F (36.7 C) (Temporal)   Resp 18   SpO2 99%  There is no height or weight on file to calculate BMI. Physical Exam: General Appearance:  Alert, cooperative, no distress, appears stated age  Head:  Normocephalic, without obvious abnormality, atraumatic  Eyes:  Conjunctiva clear, EOM's intact  Ears EACs normal bilaterally and normal TMs bilaterally  Nose: Nares normal, hypertrophic turbinates, normal mucosa, and no visible anterior polyps  Throat: Lips, tongue normal; teeth and gums normal, normal posterior oropharynx  Neck: Supple, symmetrical  Lungs:   clear to auscultation bilaterally, Respirations unlabored, no coughing  Heart:  regular rate and rhythm and no murmur, Appears well perfused  Extremities: No edema  Skin: Skin color, texture, turgor normal and no rashes or lesions on visualized portions of skin  Neurologic: No gross deficits   Labs:  Lab Orders  No laboratory test(s) ordered today   Assessment/Plan   Allergic rhinitis: improved on AIT, but suspect dog allergy , not currently covered in vials - Allergen avoidance towards grasses, ragweed, weeds, trees, indoor molds, outdoor molds, dust mites, and cat. Singulair  previously discontinued  due to effectiveness of injection Pataday  (olopatadine ) one drop per eye daily as needed for itchy/watery eyes. Allegra or Xyzal  1 tab daily.  These are long-acting antihistamines that are over-the-counter options.  Consider Ryaltris  2 sprays each nostril twice a day as needed for runny or stuffy nose.  Continue allergy  injections per protocol. Have access to epinephrine  autoinjector.  - lab work for dog allergen; will call once results available  Asthma: well controlled - Daily controller medication(s): none At onset of respiratory illness/asthma flare: Symbicort 160 mcg Inhale 2 puffs twice daily with spacer for 2 weeks or until symptoms resolve.  - Prior to physical activity: albuterol  2 puffs 10-15 minutes before physical activity. - Rescue medications: albuterol  2 puffs every 4-6 hours as needed  - Asthma control goals:  * Full participation in all desired activities (may need albuterol  before activity) * Albuterol  use two time or less a week on average (not counting use with activity) * Cough interfering with sleep two time or less a month * Oral steroids no more than once a year * No hospitalizations    Follow-up in 6 months or sooner if needed It was a pleasure seeing you again in clinic today! Thank you for allowing me to participate in your care.  Other: Allergy  injection given in clinic today  Rocky Endow, MD  [facility] 

## 2024-08-19 NOTE — Patient Instructions (Addendum)
 Allergic rhinitis: improved on AIT, but suspect dog allergy , not currently in vials - Allergen avoidance towards grasses, ragweed, weeds, trees, indoor molds, outdoor molds, dust mites, and cat. Singulair  previously discontinued due to effectiveness of injection Pataday  (olopatadine ) one drop per eye daily as needed for itchy/watery eyes. Allegra or Xyzal  1 tab daily.  These are long-acting antihistamines that are over-the-counter options.  Consider Ryaltris  2 sprays each nostril twice a day as needed for runny or stuffy nose.  Continue allergy  injections per protocol. Have access to epinephrine  autoinjector.  - lab work for dog allergen; will call once results available  Asthma: well controlled - Daily controller medication(s): none At onset of respiratory illness/asthma flare: Symbicort 160 mcg Inhale 2 puffs twice daily with spacer for 2 weeks or until symptoms resolve.  - Prior to physical activity: albuterol  2 puffs 10-15 minutes before physical activity. - Rescue medications: albuterol  2 puffs every 4-6 hours as needed  - Asthma control goals:  * Full participation in all desired activities (may need albuterol  before activity) * Albuterol  use two time or less a week on average (not counting use with activity) * Cough interfering with sleep two time or less a month * Oral steroids no more than once a year * No hospitalizations    Follow-up in 6 months or sooner if needed It was a pleasure seeing you again in clinic today! Thank you for allowing me to participate in your care.  Rocky Endow, MD [facility]

## 2024-08-22 LAB — IGE DOG W/ COMPONENT REFLEX

## 2024-08-24 ENCOUNTER — Ambulatory Visit: Payer: Self-pay | Admitting: Internal Medicine

## 2024-08-24 ENCOUNTER — Ambulatory Visit (INDEPENDENT_AMBULATORY_CARE_PROVIDER_SITE_OTHER): Admitting: Psychiatry

## 2024-08-24 DIAGNOSIS — F411 Generalized anxiety disorder: Secondary | ICD-10-CM

## 2024-08-24 NOTE — Progress Notes (Signed)
 My chart message sent

## 2024-08-24 NOTE — Progress Notes (Signed)
 [facility] Counselor/Therapist Progress Note  Patient ID: Brianna Barr, MRN: [id number],    Date: 08/24/2024  Time Spent: 55 minute   Treatment Type: Individual Therapy  Reported Symptoms: anxiety, depression, dad's health issues a real concern    Mental Status Exam:  Appearance:   Casual and Neat     Behavior:  Appropriate, Sharing, and Motivated  Motor:  Normal  Speech/Language:   Clear and Coherent  Affect:  Depressed and anxious  Mood:  anxious and depressed  Thought process:  goal directed  Thought content:    Rumination  Sensory/Perceptual disturbances:    WNL  Orientation:  oriented to person, place, time/date, situation, day of week, month of year, year, and stated date of Oct. 29, 2025  Attention:  Good  Concentration:  Good  Memory:  WNL  Fund of knowledge:   Good  Insight:    Good  Judgment:   Good  Impulse Control:  Good   Risk Assessment: Danger to Self:  No Self-injurious Behavior: No Danger to Others: No Duty to Warn:no Physical Aggression / Violence:No  Access to Firearms a concern: No  Gang Involvement:No   Subjective: Patient today reporting main symptoms to be anxiety, depression, overthinking, and mostly related to her father's health, work stress, craziness in the world, and stressors with mom and mom's MS issues. Mom probably shouldn't be driving now due some nighttime driving issues and vision issues, and is concerned her driver's license may be taken away.  Shares that Dad's ongoing health situation is not really any better. Today processing more of her fears and concernsDad did have exploratory surgery to get some biopsies. Has talked with doctor/surgeon about possible surgery on her foot/feet due to plantar fascitis. Trying  to have more hope for her upcoming foot surgery. Processing some work-related concerns, coverage if/when she has to have surgery, differing personalities, realizing her strength  and challenges. Her little  elderly dog has improved some healthwise which is good news for patient at least for now.  Continues to improve about not judging herself so harshly, being able to ask for help, and not taking everything some seriously.  Also trying to balance her work life, church life, social life, and home life more effectively.  Continues to stand by her father and all of his health challenges and currently there does not seem to be a lot of changes occurring.  Veins more involved in the life of her church which she is really enjoying and is having a positive impact on her outlook.  Weight loss injections continue with some benefit.  Encouraged to continue her self-care strategies especially and following through on medical advice, allowing more time for social life, and balancing her work/home life schedules as much as possible.  Review of treatment goals including grass and needs.    Interventions: Cognitive Behavioral Therapy, Solution-Oriented/Positive Psychology, and Ego-Supportive Long term goal: Reduce overall level, frequency, and intensity of the anxiety so that daily functioning is not impaired. Short term goal: Increase understanding of beliefs and messages that produce anxiety, depression, or worries. Strategy: Identify, challenge, and replace anxious/depressive/fearful self-talk with positive, realistic, and empowering self-talk    Diagnosis:   ICD-10-CM   1. Generalized anxiety disorder  F41.1      Plan: Patient today working well in session continuing to focus on her support of her father and his health issues and some health issues with her mother, while also balancing her work/life schedule and some health needs  of her own.  Showing good motivation today and seems to be feeling some better about herself.  Worked well in session on her anxiety primarily with some depression and looking at ways to better manage work/life/family stressors while also having time for herself and friends and church  activities all of which she enjoys.  Encouraged patient in her practice of more positive and self affirming behaviors as discussed in sessions including: Interrupt negative thought patterns and replace with more positive thoughts that do not feed her anxiety nor depression, being more open to making new friends and strengthening existing friendships which she does on some occasions especially within her church, stay on the present versus the past or the future, stay in touch with her small group of friends more regularly, use of positive self-talk, continue work with goal-directed behaviors that keep her moving in a direction that supports gradual improvement of her overall emotional health, continue to work on better managing challenging circumstances within the family and friendships, and work to make healthier choices including not taking on extra work shifts since this has proven to add to her stress and anxiety previously.  Tawania Scarola does continue working on her treatment goals and has definitely shown progress.  She is committed to keep working with specific goal-directed behaviors that supports her moving forward in a more hopeful and healthier direction now and into her future.  Goal review and progress/challenges noted with patient.  Next appointment within 2 to 3 weeks.   Barnie Bunde, LCSW

## 2024-08-24 NOTE — Progress Notes (Signed)
 Please let her know that dog is positive.  We can add a third vial if her insurance will cover it and she will have to start coming weekly again. This will mean 3 injections. We can also add it to one of her other two vials and she will have to restart that vial and come weekly. This will keep her at 2 injections.  Either way will obtain the same results. Let me know what she would like to do.

## 2024-08-25 LAB — ALLERGEN COMPONENT COMMENTS

## 2024-08-25 LAB — PANEL 606648
E101-IgE Can f 1: 0.29 kU/L — AB
E102-IgE Can f 2: <0.1 kU/L
E221-IgE Can f 3: <0.1 kU/L
E226-IgE Can f 5: 0.35 kU/L — AB

## 2024-08-25 LAB — IGE DOG W/ COMPONENT REFLEX: E005-IgE Dog Dander: 0.74 kU/L — AB

## 2024-08-29 ENCOUNTER — Ambulatory Visit: Admitting: Podiatry

## 2024-08-29 NOTE — Telephone Encounter (Signed)
 Can you confirm whether or not she has be mixed recently? I need to add dog to her mix that contains pollens. She will have to restart that vial. I will rewrite her Rx once I get confirmation back from you. If she hasn't been remixed and is due, please don't remix until the new Rx is ready.

## 2024-08-29 NOTE — Telephone Encounter (Signed)
 Is there a way we can look into whether or not her insurance will cover a third vial?

## 2024-08-31 ENCOUNTER — Other Ambulatory Visit: Payer: Self-pay | Admitting: Internal Medicine

## 2024-08-31 DIAGNOSIS — J302 Other seasonal allergic rhinitis: Secondary | ICD-10-CM

## 2024-08-31 NOTE — Progress Notes (Signed)
 Dog AIT Rx written.

## 2024-08-31 NOTE — Telephone Encounter (Signed)
 Adding a third vial to include dog only. Can you help get her scheduled or let her know when this will be ready with regards to the two she already gets?

## 2024-09-05 NOTE — Therapy (Signed)
 OUTPATIENT PHYSICAL THERAPY LOWER EXTREMITY EVALUATION   Patient Name: Brianna Barr MRN: [id number] DOB: 04-29-1996, 28 y.o., female Today's Date: 09/06/2024   END OF SESSION:  PT End of Session - 09/06/24 0936     Visit Number 1    Date for Recertification  11/01/24    PT Start Time 0933    PT Stop Time 1015    PT Time Calculation (min) 42 min          Past Medical History:  Diagnosis Date   Anxiety    Asthma    Depression    Overactive bladder    Stress incontinence    Past Surgical History:  Procedure Laterality Date   WISDOM TOOTH EXTRACTION     Patient Active Problem List   Diagnosis Date Noted   Seasonal and perennial allergic rhinitis 02/18/2024   Allergic rhinitis due to allergen 08/20/2023   Allergic conjunctivitis of both eyes 08/20/2023   Anxiety and depression 07/07/2019   Mild intermittent asthma, uncomplicated 07/07/2019   Seasonal allergies 07/07/2019   Plantar fasciitis 07/07/2019   Hyperhidrosis 07/07/2019   OAB (overactive bladder) 07/07/2019   Allergy  08/06/2018    PCP: Mercer Clotilda SAUNDERS, MD   REFERRING PROVIDER: Loel Awanda BIRCH, DPM   REFERRING DIAG: M72.2 (ICD-10-CM) - Plantar fasciitis M79.671,M79.672 (ICD-10-CM) - Pain in both feet M62.89 (ICD-10-CM) - Tightness of both gastrocnemius muscles  THERAPY DIAG:  Plantar fasciitis  Pain in both feet  Tightness of both gastrocnemius muscles  Difficulty in walking, not elsewhere classified  Muscle weakness (generalized)  RATIONALE FOR EVALUATION AND TREATMENT: Rehabilitation  ONSET DATE: several years ago  NEXT MD VISIT:    SUBJECTIVE:                                                                                                                                                                                                         SUBJECTIVE STATEMENT: 28 y/o patient referred to PT from Dr Loel (Podiatry) for B plantar fasciitis.   Patient reports long h/o plantar  fasciitis in both feet since 2020.    She received cortisone injections and custom orthotics BLE in March of this year.  She returned to podiatry in 10/25 and reported feeling about 60% better and received second round of cortisone injections.   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   Shoe wear is a thick sole tennis shoe.  She reports having tried many different types and brands of shoes and  spent a lot money to still have the pain that she has.  States the pain is worst after a 12 hour work shift.  She reports the pain is mainly over the medial plantar heel, but can go into the arch as pain worsens. R foot is worse than the L.   She also Gets B calf fatigue, soreness, and cramping the longer she is on her feet.    PAIN: Are you having pain? Yes: NPRS scale: 3/10 now;  worst over last week 8/10 Pain location: B medial heel/arches Pain description: constant pain now; constant dull ache all day progressing to sharp and tingling in toes sometimes Aggravating factors: walking on TM for exercise, but can't due to pain Relieving factors: NWB positions  PERTINENT HISTORY:  Anxiety, depression, asthma  PRECAUTIONS: None  RED FLAGS: None  WEIGHT BEARING RESTRICTIONS: No  FALLS:  Has patient fallen in last 6 months? 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 Usual hobbies, recreational or sporting activities 3  3. Getting into/out of the bath 4  4. Walking between rooms 2  5. Putting on socks/shoes 2  6. Squatting  3  7. Lifting an object, like a bag of groceries from the floor 3  8. Performing light activities around your home 3  9. Performing heavy activities around your home 2  10. Getting into/out of a car 4  11. Walking 2 blocks 3  12. Walking 1 mile 1  13. Going up/down 10 stairs (1 flight) 1  14. Standing for 1 hour 1  15.  sitting for 1 hour 3  16. Running on even ground 1  17. Running on uneven ground 1  18. Making sharp turns while running fast 1  19. Hopping  1  20. Rolling over in bed 4  Score total:  44     COGNITION: Overall cognitive status: Within functional limits for tasks assessed    SENSATION: WFL  EDEMA:  none  POSTURE:  Supine:  L malleolus is longer;   long sit test = no change.   Standing:  R foot supinates, L foot pronates  PALPATION: TTP over the medial plantar heel at p. Fascia insertion BLE.  Also TTP in both arches R >L  MUSCLE LENGTH: Heelcord: tight BLE  LOWER EXTREMITY ROM:  Active ROM Right eval Left eval  Hip flexion    Hip extension    Hip abduction    Hip adduction    Hip internal rotation    Hip external rotation    Knee flexion    Knee extension    Ankle dorsiflexion 5 5  Ankle plantarflexion 40+ 40+  Ankle inversion 40 40  Ankle eversion 20 26    LOWER EXTREMITY MMT:  p! = pain  MMT Right eval Left eval  Hip flexion    Hip extension    Hip abduction    Hip adduction  Hip internal rotation    Hip external rotation    Knee flexion    Knee extension    Ankle dorsiflexion 4 4+  Ankle plantarflexion 5; p! 5  Ankle inversion 4+ 4+  Ankle eversion 4 4   (Blank rows = not tested)  LOWER EXTREMITY SPECIAL TESTS:  Ankle special tests: Great toe extension test: negative  Prone in subtalar neutral:  R rearfoot varus of at least 5-8 degrees with  forefoot varus of 1-2 degrees       L rearfoot is 0 deg or even a little valgus appearing; forefoot varus of 2-3 deg First ray on R is a little plantarflexed, but mobile Great toe extension is 70 deg on R  FUNCTIONAL TESTS:  N/a  GAIT: Distance walked: into clinic 200' Assistive device utilized: None Level of assistance: Complete Independence Gait pattern: supinates on R, pronates on the L; toes out more on the L Comments:    TODAY'S TREATMENT:  09/06/24 SELF CARE: Provided education on PT POC progression;  initial HEP   PATIENT EDUCATION:  Education details: PT eval findings, anticipated POC, and initial HEP  Person educated: Patient Education method: Explanation, Demonstration, Verbal cues, Tactile cues, Handouts, and MedBridgeGO app access provided Education comprehension: verbalized understanding, verbal cues required, tactile cues required, and needs further education  HOME EXERCISE PROGRAM: Access Code: [email or web address] URL: [email or web address] Date: 09/06/2024 Prepared by: Garnette Montclair  Exercises - Long Sitting Calf Stretch with Strap  - 1 x daily - 7 x weekly - 1 sets - 2 reps - 1 min hold - Standing Soleus Stretch on Step  - 1 x daily - 7 x weekly - 1 sets - 2 reps - 1 min hold - Standing Gastroc Stretch on Foam 1/2 Roll  - 1 x daily - 7 x weekly - 1 sets - 2 reps - 1 min hold - Standing Plantar Fascia Mobilization with Small Ball  - 1 x daily - 7 x weekly - 1 sets - 1 reps   ASSESSMENT:  CLINICAL IMPRESSION: Brianna Barr is a 28 y.o. female who was referred to physical therapy for evaluation and treatment for BLE plantar fasciitis.    Patient reports onset of B heel/arch pain beginning several years ago.  The problem got new custom orthotics this spring and cortisone shots in the spring and again last month which have helped.   Pain is worse after working 12 hour shifts on the floor at [employer].  Patient does have considerable calf  tightness BLE.   She also has a longer LLE and is advised to try a shoe lift for the shorter RLE to see if it would help.   Patient has deficits in B LE flexibility, BLE strength, abnormal posture, and TTP with pain at the plantar fascia insertion of both heels which are interfering with ADLs and are impacting quality of life.  On LEFS patient scored 44/80 demonstrating 45% functional limitation.  Chad will benefit from skilled PT to address above deficits to improve mobility and activity tolerance with decreased pain interference.  OBJECTIVE IMPAIRMENTS: difficulty walking, decreased ROM, decreased strength, impaired flexibility, and pain.   ACTIVITY LIMITATIONS: standing, stairs, and locomotion level  PARTICIPATION LIMITATIONS: shopping, community activity, and occupation  PERSONAL FACTORS: Fitness, Time since onset of injury/illness/exacerbation, and 1-2 comorbidities: Anxiety, depression, asthma are also affecting patient's functional outcome.   REHAB POTENTIAL: Good  CLINICAL DECISION MAKING: Evolving/moderate complexity  EVALUATION COMPLEXITY: Moderate   GOALS: Goals reviewed with patient? Yes  SHORT TERM GOALS: Target date: 10/04/2024   Patient will be independent with initial HEP. Baseline: 100% PT assist required for correct completion Goal status: INITIAL  2.  Patient will report at least 25% improvement in B heel pain to improve QOL. Baseline: 8/10 worst Goal status: INITIAL    LONG TERM GOALS: Target date: 11/01/2024   Patient will be independent with advanced/ongoing HEP to improve outcomes and carryover.  Baseline: no advanced HEP yet Goal status: INITIAL  2.  Patient will report at least 50-75% improvement in B heel pain to improve QOL. Baseline: 8/10 worst Goal status: INITIAL  3.  Patient will demonstrate improved B ankle ROM to WNL to allow for normal gait and stair mechanics. Baseline: Refer to above LE ROM table Goal status: INITIAL  4.  Patient will  demonstrate improved B ankle strength to >/= 5/5 for improved stability and ease of mobility. Baseline: Refer to above LE MMT table Goal status: INITIAL  5.  Patient will be able to ambulate 5-10 hrs at work with normal gait pattern without increased pain to access community.  Baseline: pain throughout the shift, but she pushes through it Goal status: INITIAL  6. Patient will be able to ascend/descend stairs with 1 HR and reciprocal step pattern safely to access home and community.  Baseline: painful but does it Goal status: INITIAL  7.  Patient will report >/= 60/80 on LEFS (MCID = 9 pts) to demonstrate improved functional ability. Baseline: 44/80 Goal status: INITIAL  8.  Patient will demonstrate at least 19/24 on DGI to decrease risk of falls. Baseline: TBD Goal status: INITIAL   PLAN:  PT FREQUENCY: 1-2x/week  PT DURATION: 8 weeks  PLANNED INTERVENTIONS: {02835- PT Re-evaluation, 97750- Physical Performance Testing, 97110-Therapeutic exercises, 97530- Therapeutic activity, [id number]- Neuromuscular re-education, 97535- Self Care, 02859- Manual therapy, G0283- Electrical stimulation (unattended), 97016- Vasopneumatic device, [id number]- Ultrasound, 79439 (1-2 muscles), 20561 (3+ muscles)- Dry Needling, Patient/Family education, Balance training, Taping, Joint mobilization, Cryotherapy, and Moist heat  PLAN FOR NEXT SESSION: see how KT tape did;  see how HEP is going;   add some arch strengthening activities; do MFR manually and with other materials PRN to B heels and arches/p fascia; continue stretching w/ prostretch and calf/soleus  Kaelene Elliston, PT 09/06/2024, 8:16 PM

## 2024-09-06 ENCOUNTER — Other Ambulatory Visit: Payer: Self-pay

## 2024-09-06 ENCOUNTER — Ambulatory Visit: Admitting: Psychiatry

## 2024-09-06 ENCOUNTER — Ambulatory Visit: Attending: Podiatry | Admitting: Rehabilitation

## 2024-09-06 ENCOUNTER — Encounter: Payer: Self-pay | Admitting: Rehabilitation

## 2024-09-06 DIAGNOSIS — R262 Difficulty in walking, not elsewhere classified: Secondary | ICD-10-CM | POA: Insufficient documentation

## 2024-09-06 DIAGNOSIS — M6281 Muscle weakness (generalized): Secondary | ICD-10-CM | POA: Diagnosis not present

## 2024-09-06 DIAGNOSIS — M79672 Pain in left foot: Secondary | ICD-10-CM | POA: Diagnosis not present

## 2024-09-06 DIAGNOSIS — M722 Plantar fascial fibromatosis: Secondary | ICD-10-CM | POA: Diagnosis not present

## 2024-09-06 DIAGNOSIS — M6289 Other specified disorders of muscle: Secondary | ICD-10-CM | POA: Diagnosis not present

## 2024-09-06 DIAGNOSIS — J3081 Allergic rhinitis due to animal (cat) (dog) hair and dander: Secondary | ICD-10-CM | POA: Diagnosis not present

## 2024-09-06 DIAGNOSIS — F411 Generalized anxiety disorder: Secondary | ICD-10-CM | POA: Diagnosis not present

## 2024-09-06 DIAGNOSIS — M79671 Pain in right foot: Secondary | ICD-10-CM | POA: Diagnosis not present

## 2024-09-06 NOTE — Progress Notes (Signed)
 Aeroallergen Immunotherapy  Ordering Provider: Dr. Rocky Endow  Patient Details Name: Marajade Dionne Virgil MRN: [id number] Date of Birth: February 01, 1996  Order 1 of 1  Vial Label: Dog  0.5 ml (Volume)  1:10 Concentration -- Dog Epithelia   0.5  ml Extract Subtotal 4.5  ml Diluent 5.0  ml Maintenance Total  Schedule:  B Blue Vial (1:100,000): Schedule B (6 doses) Yellow Vial (1:10,000): Schedule B (6 doses) Green Vial (1:1,000): Schedule B (6 doses) Red Vial (1:100): Schedule A (14 doses)  Special Instructions: After completion of the first Red Vial, please space to every two weeks. After completion of the second Red Vial, please space to every 4 weeks. Ok to up dose new vials at 0.11mL --> 0.3 mL --> 0.5 mL. Ok to come twice weekly except in red vial, if desired, as long as there is 48 hours between injections

## 2024-09-06 NOTE — Progress Notes (Signed)
 VIAL MADE ON 09/06/24

## 2024-09-06 NOTE — Progress Notes (Signed)
 [facility] Counselor/Therapist Progress Note  Patient ID: Brianna Barr, MRN: [id number],    Date: 09/06/2024  Time Spent: 53 minutes   Treatment Type: Individual Therapy  Reported Symptoms: anxiety, depression, concern about dad's health issues and mom is having some health concerns also   Mental Status Exam:  Appearance:   Neat     Behavior:  Appropriate, Rigid, and Motivated  Motor:  Normal  Speech/Language:   Clear and Coherent  Affect:  Depressed and anxious  Mood:  anxious and depressed  Thought process:  goal directed  Thought content:    Rumination  Sensory/Perceptual disturbances:    WNL  Orientation:  oriented to person, place, time/date, situation, day of week, month of year, year, and stated date of Nov. 11, 2025  Attention:  Good  Concentration:  Good  Memory:  WNL  Fund of knowledge:   Good  Insight:    Good  Judgment:   Good  Impulse Control:  Good   Risk Assessment: Danger to Self:  No Self-injurious Behavior: No Danger to Others: No Duty to Warn:no Physical Aggression / Violence:No  Access to Firearms a concern: No  Gang Involvement:No   Subjective:  Patient reports symptoms today to include anxiety, depression, overthinking all related to work stress, father's health worries, crazy things going on in the world today, and stressor re: mom's MS issues. Very concerned about her parent's health issues and some decline. 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 Some unknowns now being shared about mom's health so re-testing is being scheduled within a few weeks. Dad continues to have some health issues which adds stress for patient and family. Still lots of uncertainties that adds to our stress, helping out with some friend/family. Tiring for patient physically and emotionally and we discussed  more of her self-care and setting limits, using specific examples of how she can recruit other volunteer helpers rather than having to do things without help. Is improving in not judging self quite as much and still working on this, realizing her strength and her challenges. Still working on not judging herself and not trying for perfection especially as she tries to not take herself so seriously and have more positive self-esteem.  Really involved in having some closer relationships within her church.  Continuing her weight loss injections with some benefit.  Patient is encouraged to continue self-care strategies, following up on the medical advice of her physicians, and having a better worklife balance allowing for more social time and time with family.  Review of her treatment goals to include both need areas and progress.    Interventions: Cognitive Behavioral Therapy, Solution-Oriented/Positive Psychology, and Ego-Supportive Long term goal: Reduce overall level, frequency, and intensity of the anxiety so that daily functioning is not impaired. Short term goal: Increase understanding of beliefs and messages that produce anxiety, depression, or worries. Strategy: Identify, challenge, and replace anxious/depressive/fearful self-talk with positive, realistic, and empowering self-talk   Diagnosis:   ICD-10-CM   1. Generalized anxiety disorder  F41.1      Plan:  Patient in session today working further on her anxiety, depression, family concerns especially regarding health of both parents and father's health issues more critical at this point though some stabilization noted.  Following up on some health needs of her own specifically plantar fasciitis in her feet which  they are delaying any type of surgery at this point.  Really good motivation today and noticeable improvement in her self-esteem.  Actively worked in session focusing mostly on her anxiety and some depression and better management of  both of these while also managing work/life/family stressors and setting limits to where she still has time for herself and activities that she enjoys with others. Continue to encourage patient and practicing her more positive and self affirming behaviors as noted in sessions including: Interrupting negative thought patterns and replacing with more positive thoughts that do not feed her anxiety and or depression, being more open to making new friends and strengthening existing friendships which she does on some occasions especially within her church, remain in the present versus the past or future, stay in touch with her small group of friends regularly, use of positive self-talk, continue work with goal-directed behaviors that keep her moving in a direction that supports gradual improvement of her overall emotional health, continue to work on better managing challenging circumstances within the family and friendships, and work to make healthier choices including not taking on extra work shifts since this has proven to add to her stress and anxiety.  Brianna Barr does show good effort and continues to work on her treatment goals, having definitely made some progress.  She remains committed to keep working with specific goal-directed behaviors that support her moving forward in a more hopeful and healthier direction now and as she envisions the future.  Goal review and progress/challenges noted with patient.  Next appointment within 2 to 3 weeks.   Brianna Bunde, LCSW

## 2024-09-08 ENCOUNTER — Telehealth: Payer: Self-pay

## 2024-09-08 NOTE — Telephone Encounter (Signed)
 Called patient to advise her vial is ready so she can come in anytime to get her shot. Advised I checked with Damita and she will not need an appt for the additional vial. We will just add it in with the others. Pt understands she may also be on different schedule due to different vials.

## 2024-09-09 ENCOUNTER — Ambulatory Visit

## 2024-09-09 DIAGNOSIS — M79672 Pain in left foot: Secondary | ICD-10-CM | POA: Diagnosis not present

## 2024-09-09 DIAGNOSIS — M79671 Pain in right foot: Secondary | ICD-10-CM | POA: Diagnosis not present

## 2024-09-09 DIAGNOSIS — M722 Plantar fascial fibromatosis: Secondary | ICD-10-CM | POA: Diagnosis not present

## 2024-09-09 DIAGNOSIS — M6281 Muscle weakness (generalized): Secondary | ICD-10-CM | POA: Diagnosis not present

## 2024-09-09 DIAGNOSIS — M6289 Other specified disorders of muscle: Secondary | ICD-10-CM | POA: Diagnosis not present

## 2024-09-09 DIAGNOSIS — R262 Difficulty in walking, not elsewhere classified: Secondary | ICD-10-CM | POA: Diagnosis not present

## 2024-09-09 NOTE — Therapy (Signed)
 OUTPATIENT PHYSICAL THERAPY LOWER EXTREMITY TREAT   Patient Name: Brianna Barr MRN: [id number] DOB: 1996-08-04, 28 y.o., female Today's Date: 09/09/2024   END OF SESSION:  PT End of Session - 09/09/24 0846     Visit Number 2    Date for Recertification  11/01/24    PT Start Time 0802    PT Stop Time 0845    PT Time Calculation (min) 43 min           Past Medical History:  Diagnosis Date   Anxiety    Asthma    Depression    Overactive bladder    Stress incontinence    Past Surgical History:  Procedure Laterality Date   WISDOM TOOTH EXTRACTION     Patient Active Problem List   Diagnosis Date Noted   Seasonal and perennial allergic rhinitis 02/18/2024   Allergic rhinitis due to allergen 08/20/2023   Allergic conjunctivitis of both eyes 08/20/2023   Anxiety and depression 07/07/2019   Mild intermittent asthma, uncomplicated 07/07/2019   Seasonal allergies 07/07/2019   Plantar fasciitis 07/07/2019   Hyperhidrosis 07/07/2019   OAB (overactive bladder) 07/07/2019   Allergy  08/06/2018    PCP: Mercer Clotilda SAUNDERS, MD   REFERRING PROVIDER: Loel Awanda BIRCH, DPM   REFERRING DIAG: M72.2 (ICD-10-CM) - Plantar fasciitis M79.671,M79.672 (ICD-10-CM) - Pain in both feet M62.89 (ICD-10-CM) - Tightness of both gastrocnemius muscles  THERAPY DIAG:  Plantar fasciitis  Pain in both feet  RATIONALE FOR EVALUATION AND TREATMENT: Rehabilitation  ONSET DATE: several years ago  NEXT MD VISIT:    SUBJECTIVE:                                                                                                                                                                                                         SUBJECTIVE STATEMENT: Pt notes pain B medial arches, just got off of work.     28 y/o patient referred to PT from Dr Loel (Podiatry) for B plantar fasciitis.   Patient reports long h/o plantar fasciitis in both feet since 2020.    She received cortisone  injections and custom orthotics BLE in March of this year.  She returned to podiatry in 10/25 and reported feeling about 60% better and received second round of cortisone injections.   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   Shoe wear is a thick sole tennis shoe.  She reports having tried many different types and brands of shoes and spent  a lot money to still have the pain that she has.  States the pain is worst after a 12 hour work shift.  She reports the pain is mainly over the medial plantar heel, but can go into the arch as pain worsens. R foot is worse than the L.   She also Gets B calf fatigue, soreness, and cramping the longer she is on her feet.    PAIN: Are you having pain? Yes: NPRS scale: 5/10 now;  worst over last week 8/10 Pain location: B medial heel/arches Pain description: constant pain now; constant dull ache all day progressing to sharp and tingling in toes sometimes Aggravating factors: walking on TM for exercise, but can't due to pain Relieving factors: NWB positions  PERTINENT HISTORY:  Anxiety, depression, asthma  PRECAUTIONS: None  RED FLAGS: None  WEIGHT BEARING RESTRICTIONS: No  FALLS:  Has patient fallen in last 6 months? 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 Usual hobbies, recreational or sporting activities 3   3. Getting into/out of the bath 4  4. Walking between rooms 2  5. Putting on socks/shoes 2  6. Squatting  3  7. Lifting an object, like a bag of groceries from the floor 3  8. Performing light activities around your home 3  9. Performing heavy activities around your home 2  10. Getting into/out of a car 4  11. Walking 2 blocks 3  12. Walking 1 mile 1  13. Going up/down 10 stairs (1 flight) 1  14. Standing for 1 hour 1  15.  sitting for 1 hour 3  16. Running on even ground 1  17. Running on uneven ground 1  18. Making sharp turns while running fast 1  19. Hopping  1  20. Rolling over in bed 4  Score total:  44     COGNITION: Overall cognitive status: Within functional limits for tasks assessed    SENSATION: WFL  EDEMA:  none  POSTURE:  Supine:  L malleolus is longer;   long sit test = no change.   Standing:  R foot supinates, L foot pronates  PALPATION: TTP over the medial plantar heel at p. Fascia insertion BLE.  Also TTP in both arches R >L  MUSCLE LENGTH: Heelcord: tight BLE  LOWER EXTREMITY ROM:  Active ROM Right eval Left eval  Hip flexion    Hip extension    Hip abduction    Hip adduction    Hip internal rotation    Hip external rotation    Knee flexion    Knee extension    Ankle dorsiflexion 5 5  Ankle plantarflexion 40+ 40+  Ankle inversion 40 40  Ankle eversion 20 26    LOWER EXTREMITY MMT:  p! = pain  MMT Right eval Left eval  Hip flexion    Hip extension    Hip abduction    Hip adduction    Hip  internal rotation    Hip external rotation    Knee flexion    Knee extension    Ankle dorsiflexion 4 4+  Ankle plantarflexion 5; p! 5  Ankle inversion 4+ 4+  Ankle eversion 4 4   (Blank rows = not tested)  LOWER EXTREMITY SPECIAL TESTS:  Ankle special tests: Great toe extension test: negative  Prone in subtalar neutral:  R rearfoot varus of at least 5-8 degrees with forefoot varus of 1-2 degrees       L rearfoot is 0 deg or even a little  valgus appearing; forefoot varus of 2-3 deg First ray on R is a little plantarflexed, but mobile Great toe extension is 70 deg on R  FUNCTIONAL TESTS:  N/a  GAIT: Distance walked: into clinic 200' Assistive device utilized: None Level of assistance: Complete Independence Gait pattern: supinates on R, pronates on the L; toes out more on the L Comments:    TODAY'S TREATMENT:  09/09/24 Seated B heel/toe raises x 20 Seated ankle EV/IV x 10 R/L Seated toe curls x 20 R/L R/L gastroc stretch long sitting x 1 min Standing gastroc stretch on 1/2 foam roll x 1 min BLE Standing soleus stretch on 1/2 foam roll x 1 min BLE STM to B planatr fascia, medial calcaneus, and to medial arch Longsitting plantar fascia stretch x 1 min  09/06/24 SELF CARE: Provided education on PT POC progression;  initial HEP   PATIENT EDUCATION:  Education details: PT eval findings, anticipated POC, and initial HEP  Person educated: Patient Education method: Explanation, Demonstration, Verbal cues, Tactile cues, Handouts, and MedBridgeGO app access provided Education comprehension: verbalized understanding, verbal cues required, tactile cues required, and needs further education  HOME EXERCISE PROGRAM: Access Code: [email or web address] URL: [email or web address] Date: 09/09/2024 Prepared by: Sol Gaskins  Exercises - Long Sitting Calf Stretch with Strap  - 1 x daily - 7 x weekly - 1 sets - 2 reps - 1 min hold - Standing Soleus Stretch on Step  - 1 x daily - 7 x weekly - 1 sets - 2 reps - 1 min hold - Standing Gastroc Stretch on Foam 1/2 Roll  - 1 x daily - 7 x weekly - 1 sets - 2 reps - 1 min hold - Standing Plantar Fascia Mobilization with Small Ball  - 1 x daily - 7 x weekly - 1 sets - 1 reps - Long Sitting Plantar Fascia Stretch with Towel  - 1 x daily - 7 x weekly - 2 sets - 2 reps - 1 min hold   ASSESSMENT:  CLINICAL IMPRESSION: TherEx focused on ankle/foot stretching and ROM. MT focused on  decreasing tension and pain along B feet and heels. Pt with good response and notes feeling looser post session. Added planatr fascia stretch as this created good response during session.    Peony Dionne Barr is a 28 y.o. female who was referred to physical therapy for evaluation and treatment for BLE plantar fasciitis.    Patient reports onset of B heel/arch pain beginning several years ago.  The problem got new custom orthotics this spring and cortisone shots in the spring and again last month which have helped.   Pain is worse after working 12 hour shifts on the floor at [employer].  Patient does have considerable calf tightness BLE.   She also has a longer LLE and is advised to try a shoe lift for the shorter RLE to see if it would help.   Patient has  deficits in B LE flexibility, BLE strength, abnormal posture, and TTP with pain at the plantar fascia insertion of both heels which are interfering with ADLs and are impacting quality of life.  On LEFS patient scored 44/80 demonstrating 45% functional limitation.  [place] will benefit from skilled PT to address above deficits to improve mobility and activity tolerance with decreased pain interference.  OBJECTIVE IMPAIRMENTS: difficulty walking, decreased ROM, decreased strength, impaired flexibility, and pain.   ACTIVITY LIMITATIONS: standing, stairs, and locomotion level  PARTICIPATION LIMITATIONS: shopping, community activity, and occupation  PERSONAL FACTORS: Fitness, Time since onset of injury/illness/exacerbation, and 1-2 comorbidities: Anxiety, depression, asthma are also affecting patient's functional outcome.   REHAB POTENTIAL: Good  CLINICAL DECISION MAKING: Evolving/moderate complexity  EVALUATION COMPLEXITY: Moderate   GOALS: Goals reviewed with patient? Yes  SHORT TERM GOALS: Target date: 10/04/2024   Patient will be independent with initial HEP. Baseline: 100% PT assist required for correct completion Goal status:  INITIAL  2.  Patient will report at least 25% improvement in B heel pain to improve QOL. Baseline: 8/10 worst Goal status: INITIAL    LONG TERM GOALS: Target date: 11/01/2024   Patient will be independent with advanced/ongoing HEP to improve outcomes and carryover.  Baseline: no advanced HEP yet Goal status: INITIAL  2.  Patient will report at least 50-75% improvement in B heel pain to improve QOL. Baseline: 8/10 worst Goal status: INITIAL  3.  Patient will demonstrate improved B ankle ROM to WNL to allow for normal gait and stair mechanics. Baseline: Refer to above LE ROM table Goal status: INITIAL  4.  Patient will demonstrate improved B ankle strength to >/= 5/5 for improved stability and ease of mobility. Baseline: Refer to above LE MMT table Goal status: INITIAL  5.  Patient will be able to ambulate 5-10 hrs at work with normal gait pattern without increased pain to access community.  Baseline: pain throughout the shift, but she pushes through it Goal status: INITIAL  6. Patient will be able to ascend/descend stairs with 1 HR and reciprocal step pattern safely to access home and community.  Baseline: painful but does it Goal status: INITIAL  7.  Patient will report >/= 60/80 on LEFS (MCID = 9 pts) to demonstrate improved functional ability. Baseline: 44/80 Goal status: INITIAL  8.  Patient will demonstrate at least 19/24 on DGI to decrease risk of falls. Baseline: TBD Goal status: INITIAL   PLAN:  PT FREQUENCY: 1-2x/week  PT DURATION: 8 weeks  PLANNED INTERVENTIONS: {02835- PT Re-evaluation, 97750- Physical Performance Testing, 97110-Therapeutic exercises, 97530- Therapeutic activity, [id number]- Neuromuscular re-education, 97535- Self Care, 02859- Manual therapy, G0283- Electrical stimulation (unattended), 97016- Vasopneumatic device, [id number]- Ultrasound, 79439 (1-2 muscles), 20561 (3+ muscles)- Dry Needling, Patient/Family education, Balance training, Taping, Joint  mobilization, Cryotherapy, and Moist heat  PLAN FOR NEXT SESSION: see how KT tape did; arch strengthening activities; do MFR manually and with other materials PRN to B heels and arches/p fascia; continue stretching w/ prostretch and calf/soleus  Sol LITTIE Gaskins, PTA 09/09/2024, 8:47 AM

## 2024-09-12 ENCOUNTER — Ambulatory Visit: Payer: Self-pay

## 2024-09-12 ENCOUNTER — Ambulatory Visit

## 2024-09-12 DIAGNOSIS — M6289 Other specified disorders of muscle: Secondary | ICD-10-CM | POA: Diagnosis not present

## 2024-09-12 DIAGNOSIS — M722 Plantar fascial fibromatosis: Secondary | ICD-10-CM | POA: Diagnosis not present

## 2024-09-12 DIAGNOSIS — J302 Other seasonal allergic rhinitis: Secondary | ICD-10-CM

## 2024-09-12 DIAGNOSIS — M79671 Pain in right foot: Secondary | ICD-10-CM

## 2024-09-12 DIAGNOSIS — R262 Difficulty in walking, not elsewhere classified: Secondary | ICD-10-CM

## 2024-09-12 DIAGNOSIS — J3089 Other allergic rhinitis: Secondary | ICD-10-CM

## 2024-09-12 DIAGNOSIS — M79672 Pain in left foot: Secondary | ICD-10-CM | POA: Diagnosis not present

## 2024-09-12 DIAGNOSIS — M6281 Muscle weakness (generalized): Secondary | ICD-10-CM | POA: Diagnosis not present

## 2024-09-12 NOTE — Therapy (Signed)
 OUTPATIENT PHYSICAL THERAPY LOWER EXTREMITY TREAT   Brianna Barr Name: Brianna Barr MRN: [id number] DOB: 05/23/1996, 28 y.o., female Today's Date: 09/12/2024   END OF SESSION:  PT End of Session - 09/12/24 1024     Visit Number 3    Date for Recertification  11/01/24    PT Start Time 0934    PT Stop Time 1017    PT Time Calculation (min) 43 min            Past Medical History:  Diagnosis Date   Anxiety    Asthma    Depression    Overactive bladder    Stress incontinence    Past Surgical History:  Procedure Laterality Date   WISDOM TOOTH EXTRACTION     Brianna Barr Active Problem List   Diagnosis Date Noted   Seasonal and perennial allergic rhinitis 02/18/2024   Allergic rhinitis due to allergen 08/20/2023   Allergic conjunctivitis of both eyes 08/20/2023   Anxiety and depression 07/07/2019   Mild intermittent asthma, uncomplicated 07/07/2019   Seasonal allergies 07/07/2019   Plantar fasciitis 07/07/2019   Hyperhidrosis 07/07/2019   OAB (overactive bladder) 07/07/2019   Allergy  08/06/2018    PCP: Mercer Clotilda SAUNDERS, MD   REFERRING PROVIDER: Loel Awanda BIRCH, DPM   REFERRING DIAG: M72.2 (ICD-10-CM) - Plantar fasciitis M79.671,M79.672 (ICD-10-CM) - Pain in both feet M62.89 (ICD-10-CM) - Tightness of both gastrocnemius muscles  THERAPY DIAG:  Plantar fasciitis  Pain in both feet  Tightness of both gastrocnemius muscles  Difficulty in walking, not elsewhere classified  RATIONALE FOR EVALUATION AND TREATMENT: Rehabilitation  ONSET DATE: several years ago  NEXT MD VISIT:    SUBJECTIVE:                                                                                                                                                                                                         SUBJECTIVE STATEMENT: Pt reports less pain today but probably because she has not work in a couple of days    28 y/o Brianna Barr referred to PT from Dr Loel (Podiatry)  for B plantar fasciitis.   Brianna Barr reports long h/o plantar fasciitis in both feet since 2020.    She received cortisone injections and custom orthotics BLE in March of this year.  She returned to podiatry in 10/25 and reported feeling about 60% better and received second round of cortisone injections.   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   Shoe wear is  a thick sole tennis shoe.  She reports having tried many different types and brands of shoes and spent a lot money to still have the pain that she has.  States the pain is worst after a 12 hour work shift.  She reports the pain is mainly over the medial plantar heel, but can go into the arch as pain worsens. R foot is worse than the L.   She also Gets B calf fatigue, soreness, and cramping the longer she is on her feet.    PAIN: Are you having pain? Yes: NPRS scale: 2/10 now;  worst over last week 8/10 Pain location: B medial heel/arches Pain description: constant pain now; constant dull ache all day progressing to sharp and tingling in toes sometimes Aggravating factors: walking on TM for exercise, but can't due to pain Relieving factors: NWB positions  PERTINENT HISTORY:  Anxiety, depression, asthma  PRECAUTIONS: None  RED FLAGS: None  WEIGHT BEARING RESTRICTIONS: No  FALLS:  Has Brianna Barr fallen in last 6 months? 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 Usual hobbies, recreational or sporting activities 3  3. Getting into/out of the bath 4  4. Walking between rooms 2  5. Putting on socks/shoes 2  6. Squatting  3  7. Lifting an object, like a bag of groceries from the floor 3  8. Performing light activities around your home 3  9. Performing heavy activities around your home 2  10. Getting into/out of a car 4  11. Walking 2 blocks 3  12. Walking 1 mile 1  13. Going up/down 10 stairs (1 flight) 1  14. Standing for 1 hour 1  15.  sitting for 1 hour 3  16. Running on even ground 1  17. Running on uneven ground 1  18. Making sharp turns while running fast 1  19. Hopping  1  20. Rolling over in bed 4  Score total:  44     COGNITION: Overall cognitive status: Within functional limits for tasks assessed    SENSATION: WFL  EDEMA:  none  POSTURE:  Supine:  L malleolus is longer;   long sit test = no change.   Standing:  R foot supinates, L foot pronates  PALPATION: TTP over the medial plantar heel at p. Fascia insertion BLE.  Also TTP in both arches R >L  MUSCLE LENGTH: Heelcord: tight BLE  LOWER EXTREMITY ROM:  Active ROM Right eval Left eval  Hip flexion    Hip extension    Hip abduction    Hip adduction    Hip internal rotation    Hip external rotation    Knee flexion    Knee extension    Ankle dorsiflexion 5 5  Ankle plantarflexion 40+ 40+  Ankle inversion 40 40  Ankle eversion 20 26    LOWER EXTREMITY MMT:  p! = pain  MMT Right eval Left eval  Hip flexion  Hip extension    Hip abduction    Hip adduction    Hip internal rotation    Hip external rotation    Knee flexion    Knee extension    Ankle dorsiflexion 4 4+  Ankle plantarflexion 5; p! 5  Ankle inversion 4+ 4+  Ankle eversion 4 4   (Blank rows = not tested)  LOWER EXTREMITY SPECIAL TESTS:  Ankle special tests: Great toe extension test: negative  Prone in subtalar  neutral:  R rearfoot varus of at least 5-8 degrees with forefoot varus of 1-2 degrees       L rearfoot is 0 deg or even a little valgus appearing; forefoot varus of 2-3 deg First ray on R is a little plantarflexed, but mobile Great toe extension is 70 deg on R  FUNCTIONAL TESTS:  N/a  GAIT: Distance walked: into clinic 200' Assistive device utilized: None Level of assistance: Complete Independence Gait pattern: supinates on R, pronates on the L; toes out more on the L Comments:    TODAY'S TREATMENT:  09/12/24 Recumbent Bike L4x27min STM to B planatr fascia, medial calcaneus, and to medial arch Longsitting plantar fascia stretch 2 x 1 min B Seated toe curls B x 20 Seated ankle EV YTB x 20 B; DF YTB x 20 B   09/09/24 Seated B heel/toe raises x 20 Seated ankle EV/IV x 10 R/L Seated toe curls x 20 R/L R/L gastroc stretch long sitting x 1 min Standing gastroc stretch on 1/2 foam roll x 1 min BLE Standing soleus stretch on 1/2 foam roll x 1 min BLE STM to B planatr fascia, medial calcaneus, and to medial arch Longsitting plantar fascia stretch x 1 min  09/06/24 SELF CARE: Provided education on PT POC progression;  initial HEP   Brianna Barr EDUCATION:  Education details: PT eval findings, anticipated POC, and initial HEP  Person educated: Brianna Barr Education method: Explanation, Demonstration, Verbal cues, Tactile cues, Handouts, and MedBridgeGO app access provided Education comprehension: verbalized understanding, verbal cues required, tactile cues required, and needs further education  HOME EXERCISE PROGRAM: Access Code: [email or web address] URL: [email or web address] Date: 09/09/2024 Prepared by: Sol Gaskins  Exercises - Long Sitting Calf Stretch with Strap  - 1 x daily - 7 x weekly - 1 sets - 2 reps - 1 min hold - Standing Soleus Stretch on Step  - 1 x daily - 7 x weekly - 1 sets - 2 reps - 1 min hold - Standing Gastroc Stretch on Foam 1/2 Roll  - 1 x daily - 7 x  weekly - 1 sets - 2 reps - 1 min hold - Standing Plantar Fascia Mobilization with Small Ball  - 1 x daily - 7 x weekly - 1 sets - 1 reps - Long Sitting Plantar Fascia Stretch with Towel  - 1 x daily - 7 x weekly - 2 sets - 2 reps - 1 min hold   ASSESSMENT:  CLINICAL IMPRESSION: TherEx focused on ankle/foot strengthening. MT focused on decreasing tension and pain along B feet and heels.  Guided through exercises providing cues throughout session for desired movement.    Brianna Barr is a 28 y.o. female who was referred to physical therapy for evaluation and treatment for BLE plantar fasciitis.    Brianna Barr reports onset of B heel/arch pain beginning several years ago.  The problem got new custom orthotics this spring and cortisone shots in the spring and again last month which have helped.   Pain is worse after  working 12 hour shifts on the floor at [employer].  Brianna Barr does have considerable calf tightness BLE.   She also has a longer LLE and is advised to try a shoe lift for the shorter RLE to see if it would help.   Brianna Barr has deficits in B LE flexibility, BLE strength, abnormal posture, and TTP with pain at the plantar fascia insertion of both heels which are interfering with ADLs and are impacting quality of life.  On LEFS Brianna Barr scored 44/80 demonstrating 45% functional limitation.  Brianna Barr will benefit from skilled PT to address above deficits to improve mobility and activity tolerance with decreased pain interference.  OBJECTIVE IMPAIRMENTS: difficulty walking, decreased ROM, decreased strength, impaired flexibility, and pain.   ACTIVITY LIMITATIONS: standing, stairs, and locomotion level  PARTICIPATION LIMITATIONS: shopping, community activity, and occupation  PERSONAL FACTORS: Fitness, Time since onset of injury/illness/exacerbation, and 1-2 comorbidities: Anxiety, depression, asthma are also affecting Brianna Barr's functional outcome.   REHAB POTENTIAL: Good  CLINICAL DECISION  MAKING: Evolving/moderate complexity  EVALUATION COMPLEXITY: Moderate   GOALS: Goals reviewed with Brianna Barr? Yes  SHORT TERM GOALS: Target date: 10/04/2024   Brianna Barr will be independent with initial HEP. Baseline: 100% PT assist required for correct completion Goal status: INITIAL  2.  Brianna Barr will report at least 25% improvement in B heel pain to improve QOL. Baseline: 8/10 worst Goal status: INITIAL    LONG TERM GOALS: Target date: 11/01/2024   Brianna Barr will be independent with advanced/ongoing HEP to improve outcomes and carryover.  Baseline: no advanced HEP yet Goal status: INITIAL  2.  Brianna Barr will report at least 50-75% improvement in B heel pain to improve QOL. Baseline: 8/10 worst Goal status: INITIAL  3.  Brianna Barr will demonstrate improved B ankle ROM to WNL to allow for normal gait and stair mechanics. Baseline: Refer to above LE ROM table Goal status: INITIAL  4.  Brianna Barr will demonstrate improved B ankle strength to >/= 5/5 for improved stability and ease of mobility. Baseline: Refer to above LE MMT table Goal status: INITIAL  5.  Brianna Barr will be able to ambulate 5-10 hrs at work with normal gait pattern without increased pain to access community.  Baseline: pain throughout the shift, but she pushes through it Goal status: INITIAL  6. Brianna Barr will be able to ascend/descend stairs with 1 HR and reciprocal step pattern safely to access home and community.  Baseline: painful but does it Goal status: INITIAL  7.  Brianna Barr will report >/= 60/80 on LEFS (MCID = 9 pts) to demonstrate improved functional ability. Baseline: 44/80 Goal status: INITIAL  8.  Brianna Barr will demonstrate at least 19/24 on DGI to decrease risk of falls. Baseline: TBD Goal status: INITIAL   PLAN:  PT FREQUENCY: 1-2x/week  PT DURATION: 8 weeks  PLANNED INTERVENTIONS: {02835- PT Re-evaluation, 97750- Physical Performance Testing, 97110-Therapeutic exercises, 97530- Therapeutic activity,  [id number]- Neuromuscular re-education, 97535- Self Care, 02859- Manual therapy, G0283- Electrical stimulation (unattended), 97016- Vasopneumatic device, [id number]- Ultrasound, 79439 (1-2 muscles), 20561 (3+ muscles)- Dry Needling, Brianna Barr/Family education, Balance training, Taping, Joint mobilization, Cryotherapy, and Moist heat  PLAN FOR NEXT SESSION: see how KT tape did; arch strengthening activities; do MFR manually and with other materials PRN to B heels and arches/p fascia; continue stretching w/ prostretch and calf/soleus  Sol LITTIE Gaskins, PTA 09/12/2024, 10:24 AM

## 2024-09-16 ENCOUNTER — Ambulatory Visit

## 2024-09-16 DIAGNOSIS — M722 Plantar fascial fibromatosis: Secondary | ICD-10-CM | POA: Diagnosis not present

## 2024-09-16 DIAGNOSIS — M79671 Pain in right foot: Secondary | ICD-10-CM | POA: Diagnosis not present

## 2024-09-16 DIAGNOSIS — R262 Difficulty in walking, not elsewhere classified: Secondary | ICD-10-CM | POA: Diagnosis not present

## 2024-09-16 DIAGNOSIS — M79672 Pain in left foot: Secondary | ICD-10-CM | POA: Diagnosis not present

## 2024-09-16 DIAGNOSIS — M6289 Other specified disorders of muscle: Secondary | ICD-10-CM

## 2024-09-16 DIAGNOSIS — M6281 Muscle weakness (generalized): Secondary | ICD-10-CM

## 2024-09-16 NOTE — Therapy (Signed)
 OUTPATIENT PHYSICAL THERAPY LOWER EXTREMITY TREAT   Patient Name: Anwar Dionne Niess MRN: [id number] DOB: [birth year], 28 y.o., female Today's Date: 09/16/2024   END OF SESSION:  PT End of Session - 09/16/24 1014     Visit Number 4    Date for Recertification  11/01/24    PT Start Time 0935    PT Stop Time 1016    PT Time Calculation (min) 41 min             Past Medical History:  Diagnosis Date   Anxiety    Asthma    Depression    Overactive bladder    Stress incontinence    Past Surgical History:  Procedure Laterality Date   WISDOM TOOTH EXTRACTION     Patient Active Problem List   Diagnosis Date Noted   Seasonal and perennial allergic rhinitis 02/18/2024   Allergic rhinitis due to allergen 08/20/2023   Allergic conjunctivitis of both eyes 08/20/2023   Anxiety and depression 07/07/2019   Mild intermittent asthma, uncomplicated 07/07/2019   Seasonal allergies 07/07/2019   Plantar fasciitis 07/07/2019   Hyperhidrosis 07/07/2019   OAB (overactive bladder) 07/07/2019   Allergy  08/06/2018    PCP: Mercer Clotilda SAUNDERS, MD   REFERRING PROVIDER: Loel Awanda BIRCH, DPM   REFERRING DIAG: M72.2 (ICD-10-CM) - Plantar fasciitis M79.671,M79.672 (ICD-10-CM) - Pain in both feet M62.89 (ICD-10-CM) - Tightness of both gastrocnemius muscles  THERAPY DIAG:  Plantar fasciitis  Pain in both feet  Tightness of both gastrocnemius muscles  Difficulty in walking, not elsewhere classified  Muscle weakness (generalized)  RATIONALE FOR EVALUATION AND TREATMENT: Rehabilitation  ONSET DATE: several years ago  NEXT MD VISIT:    SUBJECTIVE:                                                                                                                                                                                                         SUBJECTIVE STATEMENT: Pain now after working last night, stood for 4 straight hours, pain was at 7/10 but now 4/10    28 y/o patient  referred to PT from Dr Loel (Podiatry) for B plantar fasciitis.   Patient reports long h/o plantar fasciitis in both feet since 2020.    She received cortisone injections and custom orthotics BLE in March of this year.  She returned to podiatry in 10/25 and reported feeling about 60% better and received second round of cortisone injections.   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   Shoe wear is a thick sole tennis shoe.  She reports having tried many different types and brands of shoes and spent a lot money to still have the pain that she has.  States the pain is worst after a 12 hour work shift.  She reports the pain is mainly over the medial plantar heel, but can go into the arch as pain worsens. R foot is worse than the L.   She also Gets B calf fatigue, soreness, and cramping the longer she is on her feet.    PAIN: Are you having pain? Yes: NPRS scale: 4/10 now;  worst over last week 8/10 Pain location: B medial heel/arches Pain description: constant pain now; constant dull ache all day progressing to sharp and tingling in toes sometimes Aggravating factors: walking on TM for exercise, but can't due to pain Relieving factors: NWB positions  PERTINENT HISTORY:  Anxiety, depression, asthma  PRECAUTIONS: None  RED FLAGS: None  WEIGHT BEARING RESTRICTIONS: No  FALLS:  Has patient fallen in last 6 months? 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 Usual hobbies, recreational or sporting activities 3  3. Getting into/out of the bath 4  4. Walking between rooms 2  5. Putting on socks/shoes 2  6. Squatting  3  7. Lifting an object, like a bag of groceries from the floor 3  8. Performing light activities around your home 3  9. Performing heavy activities around your home 2  10. Getting into/out of a car 4  11. Walking 2 blocks 3  12. Walking 1 mile 1  13. Going up/down 10 stairs (1 flight) 1  14. Standing for 1 hour 1  15.  sitting for 1 hour 3  16. Running on even ground 1  17. Running on uneven ground 1  18. Making sharp turns while running fast 1  19. Hopping  1  20. Rolling over in bed 4  Score total:  44     COGNITION: Overall cognitive status: Within functional limits for tasks assessed    SENSATION: WFL  EDEMA:  none  POSTURE:  Supine:  L malleolus is longer;   long sit test = no change.   Standing:  R foot supinates, L foot pronates  PALPATION: TTP over the medial plantar heel at p. Fascia insertion BLE.  Also TTP in both arches R >L  MUSCLE LENGTH: Heelcord: tight BLE  LOWER EXTREMITY ROM:  Active ROM Right eval Left eval  Hip flexion    Hip extension    Hip abduction    Hip adduction    Hip internal rotation    Hip external rotation    Knee flexion    Knee extension    Ankle dorsiflexion 5 5  Ankle plantarflexion 40+ 40+  Ankle inversion 40 40  Ankle eversion 20 26    LOWER EXTREMITY MMT:  p! = pain  MMT Right  eval Left eval  Hip flexion    Hip extension    Hip abduction    Hip adduction    Hip internal rotation    Hip external rotation    Knee flexion    Knee extension    Ankle dorsiflexion 4 4+  Ankle plantarflexion 5; p! 5  Ankle inversion 4+ 4+  Ankle eversion 4 4   (Blank rows = not tested)  LOWER EXTREMITY SPECIAL TESTS:  Ankle special tests: Great toe  extension test: negative  Prone in subtalar neutral:  R rearfoot varus of at least 5-8 degrees with forefoot varus of 1-2 degrees       L rearfoot is 0 deg or even a little valgus appearing; forefoot varus of 2-3 deg First ray on R is a little plantarflexed, but mobile Great toe extension is 70 deg on R  FUNCTIONAL TESTS:  N/a  GAIT: Distance walked: into clinic 200' Assistive device utilized: None Level of assistance: Complete Independence Gait pattern: supinates on R, pronates on the L; toes out more on the L Comments:    TODAY'S TREATMENT:  09/16/24 Bike L2x13min BAPS level 3 BLE: Ankle PF/DF 2x10 EV/IV 2x10 CW/CCW circles 2x10 Seated ankle PF with GTB x 20 BLE IASTM with edge tool to B planatr fascia, medial calcaneus, and to medial arch Toe curl+ PF GTB x 20      09/12/24 Recumbent Bike L4x58min STM to B planatr fascia, medial calcaneus, and to medial arch Longsitting plantar fascia stretch 2 x 1 min B Seated toe curls B x 20 Seated ankle EV YTB x 20 B; DF YTB x 20 B   09/09/24 Seated B heel/toe raises x 20 Seated ankle EV/IV x 10 R/L Seated toe curls x 20 R/L R/L gastroc stretch long sitting x 1 min Standing gastroc stretch on 1/2 foam roll x 1 min BLE Standing soleus stretch on 1/2 foam roll x 1 min BLE STM to B planatr fascia, medial calcaneus, and to medial arch Longsitting plantar fascia stretch x 1 min  09/06/24 SELF CARE: Provided education on PT POC progression;  initial HEP   PATIENT EDUCATION:  Education details: PT eval findings, anticipated POC, and initial HEP  Person educated: Patient Education method: Explanation, Demonstration, Verbal cues, Tactile cues, Handouts, and MedBridgeGO app access provided Education comprehension: verbalized understanding, verbal cues required, tactile cues required, and needs further education  HOME EXERCISE PROGRAM: Access Code: [email or web address] URL: [email or web address] Date: 09/16/2024 Prepared  by: Sol Gaskins  Exercises - Long Sitting Calf Stretch with Strap  - 1 x daily - 7 x weekly - 1 sets - 2 reps - 1 min hold - Standing Soleus Stretch on Step  - 1 x daily - 7 x weekly - 1 sets - 2 reps - 1 min hold - Standing Gastroc Stretch on Foam 1/2 Roll  - 1 x daily - 7 x weekly - 1 sets - 2 reps - 1 min hold - Standing Plantar Fascia Mobilization with Small Ball  - 1 x daily - 7 x weekly - 1 sets - 1 reps - Long Sitting Plantar Fascia Stretch with Towel  - 1 x daily - 7 x weekly - 2 sets - 2 reps - 1 min hold - CLX Ankle Dorsiflexion and Eversion  - 1 x daily - 7 x weekly - 2 sets - 10 reps - Seated Ankle Dorsiflexion with Resistance  - 1 x daily - 7 x weekly - 2 sets - 10 reps - Ankle  and Toe Plantarflexion with Resistance  - 1 x daily - 7 x weekly - 2 sets - 10 reps   ASSESSMENT:  CLINICAL IMPRESSION: TherEx focused on ankle/foot strengthening. MT focused on decreasing tension and pain along B feet and heels.  Pt had increased pain today d/t working last night and notes that she stood for 4 hours straight. Her pain decreased after the interventions, she does very well with manual.    Adysen Dionne Coco is a 28 y.o. female who was referred to physical therapy for evaluation and treatment for BLE plantar fasciitis.    Patient reports onset of B heel/arch pain beginning several years ago.  The problem got new custom orthotics this spring and cortisone shots in the spring and again last month which have helped.   Pain is worse after working 12 hour shifts on the floor at [employer].  Patient does have considerable calf tightness BLE.   She also has a longer LLE and is advised to try a shoe lift for the shorter RLE to see if it would help.   Patient has deficits in B LE flexibility, BLE strength, abnormal posture, and TTP with pain at the plantar fascia insertion of both heels which are interfering with ADLs and are impacting quality of life.  On LEFS patient scored 44/80 demonstrating  45% functional limitation.  Shadiyah will benefit from skilled PT to address above deficits to improve mobility and activity tolerance with decreased pain interference.  OBJECTIVE IMPAIRMENTS: difficulty walking, decreased ROM, decreased strength, impaired flexibility, and pain.   ACTIVITY LIMITATIONS: standing, stairs, and locomotion level  PARTICIPATION LIMITATIONS: shopping, community activity, and occupation  PERSONAL FACTORS: Fitness, Time since onset of injury/illness/exacerbation, and 1-2 comorbidities: Anxiety, depression, asthma are also affecting patient's functional outcome.   REHAB POTENTIAL: Good  CLINICAL DECISION MAKING: Evolving/moderate complexity  EVALUATION COMPLEXITY: Moderate   GOALS: Goals reviewed with patient? Yes  SHORT TERM GOALS: Target date: 10/04/2024   Patient will be independent with initial HEP. Baseline: 100% PT assist required for correct completion Goal status: MET- 09/16/24  2.  Patient will report at least 25% improvement in B heel pain to improve QOL. Baseline: 8/10 worst Goal status: INITIAL    LONG TERM GOALS: Target date: 11/01/2024   Patient will be independent with advanced/ongoing HEP to improve outcomes and carryover.  Baseline: no advanced HEP yet Goal status: INITIAL  2.  Patient will report at least 50-75% improvement in B heel pain to improve QOL. Baseline: 8/10 worst Goal status: INITIAL  3.  Patient will demonstrate improved B ankle ROM to WNL to allow for normal gait and stair mechanics. Baseline: Refer to above LE ROM table Goal status: INITIAL  4.  Patient will demonstrate improved B ankle strength to >/= 5/5 for improved stability and ease of mobility. Baseline: Refer to above LE MMT table Goal status: INITIAL  5.  Patient will be able to ambulate 5-10 hrs at work with normal gait pattern without increased pain to access community.  Baseline: pain throughout the shift, but she pushes through it Goal status:  INITIAL  6. Patient will be able to ascend/descend stairs with 1 HR and reciprocal step pattern safely to access home and community.  Baseline: painful but does it Goal status: INITIAL  7.  Patient will report >/= 60/80 on LEFS (MCID = 9 pts) to demonstrate improved functional ability. Baseline: 44/80 Goal status: INITIAL  8.  Patient will demonstrate at least 19/24 on DGI to decrease risk  of falls. Baseline: TBD Goal status: INITIAL   PLAN:  PT FREQUENCY: 1-2x/week  PT DURATION: 8 weeks  PLANNED INTERVENTIONS: {02835- PT Re-evaluation, 97750- Physical Performance Testing, 97110-Therapeutic exercises, 97530- Therapeutic activity, [id number]- Neuromuscular re-education, 97535- Self Care, [id number]- Manual therapy, [id number]- Electrical stimulation (unattended), 97016- Vasopneumatic device, [id number]- Ultrasound, 79439 (1-2 muscles), 20561 (3+ muscles)- Dry Needling, Patient/Family education, Balance training, Taping, Joint mobilization, Cryotherapy, and Moist heat  PLAN FOR NEXT SESSION: see how KT tape did; arch strengthening activities; do MFR manually and with other materials PRN to B heels and arches/p fascia; continue stretching w/ prostretch and calf/soleus  Sol LITTIE Gaskins, PTA 09/16/2024, 10:17 AM

## 2024-09-19 ENCOUNTER — Ambulatory Visit

## 2024-09-19 DIAGNOSIS — R262 Difficulty in walking, not elsewhere classified: Secondary | ICD-10-CM | POA: Diagnosis not present

## 2024-09-19 DIAGNOSIS — M6281 Muscle weakness (generalized): Secondary | ICD-10-CM

## 2024-09-19 DIAGNOSIS — M722 Plantar fascial fibromatosis: Secondary | ICD-10-CM | POA: Diagnosis not present

## 2024-09-19 DIAGNOSIS — M79672 Pain in left foot: Secondary | ICD-10-CM | POA: Diagnosis not present

## 2024-09-19 DIAGNOSIS — M79671 Pain in right foot: Secondary | ICD-10-CM | POA: Diagnosis not present

## 2024-09-19 DIAGNOSIS — M6289 Other specified disorders of muscle: Secondary | ICD-10-CM

## 2024-09-19 NOTE — Therapy (Signed)
 OUTPATIENT PHYSICAL THERAPY LOWER EXTREMITY TREAT   Patient Name: Brianna Barr MRN: [id number] DOB: 02-01-1996, 28 y.o., female Today's Date: 09/19/2024   END OF SESSION:  PT End of Session - 09/19/24 0958     Visit Number 5    Date for Recertification  11/01/24    PT Start Time 0934    PT Stop Time 1017    PT Time Calculation (min) 43 min              Past Medical History:  Diagnosis Date   Anxiety    Asthma    Depression    Overactive bladder    Stress incontinence    Past Surgical History:  Procedure Laterality Date   WISDOM TOOTH EXTRACTION     Patient Active Problem List   Diagnosis Date Noted   Seasonal and perennial allergic rhinitis 02/18/2024   Allergic rhinitis due to allergen 08/20/2023   Allergic conjunctivitis of both eyes 08/20/2023   Anxiety and depression 07/07/2019   Mild intermittent asthma, uncomplicated 07/07/2019   Seasonal allergies 07/07/2019   Plantar fasciitis 07/07/2019   Hyperhidrosis 07/07/2019   OAB (overactive bladder) 07/07/2019   Allergy  08/06/2018    PCP: Mercer Clotilda SAUNDERS, MD   REFERRING PROVIDER: Loel Awanda BIRCH, DPM   REFERRING DIAG: M72.2 (ICD-10-CM) - Plantar fasciitis M79.671,M79.672 (ICD-10-CM) - Pain in both feet M62.89 (ICD-10-CM) - Tightness of both gastrocnemius muscles  THERAPY DIAG:  Plantar fasciitis  Pain in both feet  Tightness of both gastrocnemius muscles  Difficulty in walking, not elsewhere classified  Muscle weakness (generalized)  RATIONALE FOR EVALUATION AND TREATMENT: Rehabilitation  ONSET DATE: several years ago  NEXT MD VISIT:    SUBJECTIVE:                                                                                                                                                                                                         SUBJECTIVE STATEMENT: Pain is not bad now, but did not work last night    28 y/o patient referred to PT from Dr Loel (Podiatry)  for B plantar fasciitis.   Patient reports long h/o plantar fasciitis in both feet since 2020.    She received cortisone injections and custom orthotics BLE in March of this year.  She returned to podiatry in 10/25 and reported feeling about 60% better and received second round of cortisone injections.   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   Shoe wear is  a thick sole tennis shoe.  She reports having tried many different types and brands of shoes and spent a lot money to still have the pain that she has.  States the pain is worst after a 12 hour work shift.  She reports the pain is mainly over the medial plantar heel, but can go into the arch as pain worsens. R foot is worse than the L.   She also Gets B calf fatigue, soreness, and cramping the longer she is on her feet.    PAIN: Are you having pain? Yes: NPRS scale: 2/10 now;  worst over last week 7/10 Pain location: B medial heel/arches Pain description: constant pain now; constant dull ache all day progressing to sharp and tingling in toes sometimes Aggravating factors: walking on TM for exercise, but can't due to pain Relieving factors: NWB positions  PERTINENT HISTORY:  Anxiety, depression, asthma  PRECAUTIONS: None  RED FLAGS: None  WEIGHT BEARING RESTRICTIONS: No  FALLS:  Has patient fallen in last 6 months? 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 Usual hobbies, recreational or sporting activities 3  3. Getting into/out of the bath 4  4. Walking between rooms 2  5. Putting on socks/shoes 2  6. Squatting  3  7. Lifting an object, like a bag of groceries from the floor 3  8. Performing light activities around your home 3  9. Performing heavy activities around your home 2  10. Getting into/out of a car 4  11. Walking 2 blocks 3  12. Walking 1 mile 1  13. Going up/down 10 stairs (1 flight) 1  14. Standing for 1 hour 1  15.  sitting for 1 hour 3  16. Running on even ground 1  17. Running on uneven ground 1  18. Making sharp turns while running fast 1  19. Hopping  1  20. Rolling over in bed 4  Score total:  44     COGNITION: Overall cognitive status: Within functional limits for tasks assessed    SENSATION: WFL  EDEMA:  none  POSTURE:  Supine:  L malleolus is longer;   long sit test = no change.   Standing:  R foot supinates, L foot pronates  PALPATION: TTP over the medial plantar heel at p. Fascia insertion BLE.  Also TTP in both arches R >L  MUSCLE LENGTH: Heelcord: tight BLE  LOWER EXTREMITY ROM:  Active ROM Right eval Left eval R 09/19/24 L 09/19/24  Hip flexion      Hip extension      Hip abduction      Hip adduction      Hip internal rotation      Hip external rotation      Knee flexion      Knee extension      Ankle dorsiflexion 5 5 7  -5  Ankle plantarflexion 40+ 40+ 62 60+  Ankle inversion 40 40  30 39  Ankle eversion 20 26 26 26     LOWER EXTREMITY MMT:  p! = pain  MMT Right eval Left eval  Hip flexion    Hip extension    Hip abduction    Hip adduction    Hip internal rotation    Hip external rotation    Knee flexion    Knee extension    Ankle dorsiflexion 4 4+  Ankle plantarflexion 5; p! 5  Ankle inversion 4+ 4+  Ankle eversion 4 4   (Blank rows = not tested)  LOWER EXTREMITY SPECIAL TESTS:  Ankle special  tests: Great toe extension test: negative  Prone in subtalar neutral:  R rearfoot varus of at least 5-8 degrees with forefoot varus of 1-2 degrees       L rearfoot is 0 deg or even a little valgus appearing; forefoot varus of 2-3 deg First ray on R is a little plantarflexed, but mobile Great toe extension is 70 deg on R  FUNCTIONAL TESTS:  N/a  GAIT: Distance walked: into clinic 200' Assistive device utilized: None Level of assistance: Complete Independence Gait pattern: supinates on R, pronates on the L; toes out more on the L Comments:    TODAY'S TREATMENT:  09/19/24 Bike L4x69min Measured ankle ROM Gastroc stretch on foam roll 2x69min; soleus stretch 2x51min BLE Seated B ankle EV,IV,DF x 10 RTB  Seated plantar fascia stretch on ground x 30' B Standing plantar fascia stretch on ground x 30' B Seated hip ABD blue TB x 20  09/16/24 Bike L2x64min BAPS level 3 BLE: Ankle PF/DF 2x10 EV/IV 2x10 CW/CCW circles 2x10 Seated ankle PF with GTB x 20 BLE IASTM with edge tool to B planatr fascia, medial calcaneus, and to medial arch Toe curl+ PF GTB x 20      09/12/24 Recumbent Bike L4x22min STM to B planatr fascia, medial calcaneus, and to medial arch Longsitting plantar fascia stretch 2 x 1 min B Seated toe curls B x 20 Seated ankle EV YTB x 20 B; DF YTB x 20 B   09/09/24 Seated B heel/toe raises x 20 Seated ankle EV/IV x 10 R/L Seated toe curls x 20 R/L R/L gastroc stretch long sitting x 1 min Standing gastroc stretch on 1/2 foam roll x 1 min BLE Standing soleus stretch on 1/2 foam roll x 1 min BLE STM to B planatr fascia, medial calcaneus, and to medial arch Longsitting plantar fascia stretch x 1 min  09/06/24 SELF CARE: Provided education on PT POC progression;  initial HEP   PATIENT EDUCATION:  Education details: PT eval findings, anticipated POC, and initial HEP  Person educated: Patient Education method: Explanation, Demonstration, Verbal cues, Tactile cues,  Handouts, and MedBridgeGO app access provided Education comprehension: verbalized understanding, verbal cues required, tactile cues required, and needs further education  HOME EXERCISE PROGRAM: Access Code: [email or web address] URL: [email or web address] Date: 09/19/2024 Prepared by: Sol Gaskins  Exercises - Long Sitting Calf Stretch with Strap  - 1 x daily - 7 x weekly - 1 sets - 2 reps - 1 min hold - Standing Soleus Stretch on Step  - 1 x daily - 7 x weekly - 1 sets - 2 reps - 1 min hold - Standing Gastroc Stretch on Foam 1/2 Roll  - 1 x daily - 7 x weekly - 1 sets - 2 reps - 1 min hold - Standing Plantar Fascia Mobilization with Small Ball  - 1 x daily - 7 x weekly - 1 sets - 1  reps - Long Sitting Plantar Fascia Stretch with Towel  - 1 x daily - 7 x weekly - 2 sets - 2 reps - 1 min hold - CLX Ankle Dorsiflexion and Eversion  - 1 x daily - 7 x weekly - 2 sets - 10 reps - Seated Ankle Dorsiflexion with Resistance  - 1 x daily - 7 x weekly - 2 sets - 10 reps - Ankle and Toe Plantarflexion with Resistance  - 1 x daily - 7 x weekly - 2 sets - 10 reps - Seated Ankle Inversion with Resistance  - 1 x daily - 7 x weekly - 2 sets - 10 reps   ASSESSMENT:  CLINICAL IMPRESSION: Continued with ankle intrinsic strengthening and heel cord flexibility. Patient was doing better today as she did not work last night. She notes improvement at rest w/ pain but minimal improvement at work (unfortunately she needs to stand for long hours at work).    Brianna Barr is a 28 y.o. female who was referred to physical therapy for evaluation and treatment for BLE plantar fasciitis.    Patient reports onset of B heel/arch pain beginning several years ago.  The problem got new custom orthotics this spring and cortisone shots in the spring and again last month which have helped.   Pain is worse after working 12 hour shifts on the floor at [employer].  Patient does have considerable calf tightness BLE.    She also has a longer LLE and is advised to try a shoe lift for the shorter RLE to see if it would help.   Patient has deficits in B LE flexibility, BLE strength, abnormal posture, and TTP with pain at the plantar fascia insertion of both heels which are interfering with ADLs and are impacting quality of life.  On LEFS patient scored 44/80 demonstrating 45% functional limitation.  Myleka will benefit from skilled PT to address above deficits to improve mobility and activity tolerance with decreased pain interference.  OBJECTIVE IMPAIRMENTS: difficulty walking, decreased ROM, decreased strength, impaired flexibility, and pain.   ACTIVITY LIMITATIONS: standing, stairs, and locomotion level  PARTICIPATION LIMITATIONS: shopping, community activity, and occupation  PERSONAL FACTORS: Fitness, Time since onset of injury/illness/exacerbation, and 1-2 comorbidities: Anxiety, depression, asthma are also affecting patient's functional outcome.   REHAB POTENTIAL: Good  CLINICAL DECISION MAKING: Evolving/moderate complexity  EVALUATION COMPLEXITY: Moderate   GOALS: Goals reviewed with patient? Yes  SHORT TERM GOALS: Target date: 10/04/2024   Patient will be independent with initial HEP. Baseline: 100% PT assist required for correct completion Goal status: MET- 09/16/24  2.  Patient will report at least 25% improvement in B heel pain to improve QOL. Baseline: 8/10 worst Goal status: MET- 09/19/24     LONG TERM GOALS: Target date: 11/01/2024   Patient will be independent with advanced/ongoing HEP to improve outcomes and carryover.  Baseline: no advanced HEP yet Goal status: INITIAL  2.  Patient will report at least 50-75% improvement in B heel pain to improve QOL. Baseline: 8/10 worst Goal status: IN PROGRESS- 09/19/24 40% improvement  3.  Patient will demonstrate improved B ankle ROM to WNL to allow for normal gait and stair mechanics. Baseline: Refer to above LE ROM table Goal status: IN  PROGRESS- 09/19/24 see table  4.  Patient will demonstrate improved B ankle strength to >/= 5/5 for improved stability and ease of mobility. Baseline: Refer to above LE MMT table Goal status: INITIAL  5.  Patient will be able to  ambulate 5-10 hrs at work with normal gait pattern without increased pain to access community.  Baseline: pain throughout the shift, but she pushes through it Goal status: INITIAL  6. Patient will be able to ascend/descend stairs with 1 HR and reciprocal step pattern safely to access home and community.  Baseline: painful but does it Goal status: INITIAL  7.  Patient will report >/= 60/80 on LEFS (MCID = 9 pts) to demonstrate improved functional ability. Baseline: 44/80 Goal status: INITIAL  8.  Patient will demonstrate at least 19/24 on DGI to decrease risk of falls. Baseline: TBD Goal status: INITIAL   PLAN:  PT FREQUENCY: 1-2x/week  PT DURATION: 8 weeks  PLANNED INTERVENTIONS: {02835- PT Re-evaluation, 97750- Physical Performance Testing, 97110-Therapeutic exercises, 97530- Therapeutic activity, 97112- Neuromuscular re-education, 97535- Self Care, 02859- Manual therapy, G0283- Electrical stimulation (unattended), 97016- Vasopneumatic device, [id number]- Ultrasound, 79439 (1-2 muscles), 20561 (3+ muscles)- Dry Needling, Patient/Family education, Balance training, Taping, Joint mobilization, Cryotherapy, and Moist heat  PLAN FOR NEXT SESSION: hip strengthening; arch strengthening activities; do MFR manually and with other materials PRN to B heels and arches/p fascia; continue stretching w/ prostretch and calf/soleus  Sol LITTIE Gaskins, PTA 09/19/2024, 10:25 AM

## 2024-09-20 ENCOUNTER — Ambulatory Visit (INDEPENDENT_AMBULATORY_CARE_PROVIDER_SITE_OTHER): Admitting: Psychiatry

## 2024-09-20 DIAGNOSIS — F411 Generalized anxiety disorder: Secondary | ICD-10-CM | POA: Diagnosis not present
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
## 2024-09-21 ENCOUNTER — Encounter: Payer: Self-pay | Admitting: Rehabilitation

## 2024-09-21 ENCOUNTER — Ambulatory Visit: Admitting: Rehabilitation

## 2024-09-21 DIAGNOSIS — M79671 Pain in right foot: Secondary | ICD-10-CM | POA: Diagnosis not present

## 2024-09-21 DIAGNOSIS — M6281 Muscle weakness (generalized): Secondary | ICD-10-CM | POA: Diagnosis not present

## 2024-09-21 DIAGNOSIS — M722 Plantar fascial fibromatosis: Secondary | ICD-10-CM | POA: Diagnosis not present

## 2024-09-21 DIAGNOSIS — M79672 Pain in left foot: Secondary | ICD-10-CM | POA: Diagnosis not present

## 2024-09-21 DIAGNOSIS — R262 Difficulty in walking, not elsewhere classified: Secondary | ICD-10-CM | POA: Diagnosis not present

## 2024-09-21 DIAGNOSIS — M6289 Other specified disorders of muscle: Secondary | ICD-10-CM | POA: Diagnosis not present

## 2024-09-21 NOTE — Therapy (Signed)
 OUTPATIENT PHYSICAL THERAPY LOWER EXTREMITY TREATMENT   Patient Name: Brianna Barr MRN: [id number] DOB: August 06, 1996, 28 y.o., female Today's Date: 09/21/2024   END OF SESSION:  PT End of Session - 09/21/24 0942     Visit Number 6    Date for Recertification  11/01/24    PT Start Time 0940    PT Stop Time 1025    PT Time Calculation (min) 45 min               Past Medical History:  Diagnosis Date   Anxiety    Asthma    Depression    Overactive bladder    Stress incontinence    Past Surgical History:  Procedure Laterality Date   WISDOM TOOTH EXTRACTION     Patient Active Problem List   Diagnosis Date Noted   Seasonal and perennial allergic rhinitis 02/18/2024   Allergic rhinitis due to allergen 08/20/2023   Allergic conjunctivitis of both eyes 08/20/2023   Anxiety and depression 07/07/2019   Mild intermittent asthma, uncomplicated 07/07/2019   Seasonal allergies 07/07/2019   Plantar fasciitis 07/07/2019   Hyperhidrosis 07/07/2019   OAB (overactive bladder) 07/07/2019   Allergy  08/06/2018    PCP: Mercer Clotilda SAUNDERS, MD   REFERRING PROVIDER: Loel Awanda BIRCH, DPM   REFERRING DIAG: M72.2 (ICD-10-CM) - Plantar fasciitis M79.671,M79.672 (ICD-10-CM) - Pain in both feet M62.89 (ICD-10-CM) - Tightness of both gastrocnemius muscles  THERAPY DIAG:  Plantar fasciitis  Pain in both feet  Tightness of both gastrocnemius muscles  Difficulty in walking, not elsewhere classified  Muscle weakness (generalized)  RATIONALE FOR EVALUATION AND TREATMENT: Rehabilitation  ONSET DATE: several years ago  NEXT MD VISIT:    SUBJECTIVE:                                                                                                                                                                                                         SUBJECTIVE STATEMENT: Patient rates pain today 3/10 with R heel being worse than the L.   She states she can see improvement in her  pain on her days off from work now.   States workdays are still difficult the longer she is on her feet, but off days are definitely improving.    28 y/o patient referred to PT from Dr Loel (Podiatry) for B plantar fasciitis.   Patient reports long h/o plantar fasciitis in both feet since 2020.    She received cortisone injections and custom orthotics BLE in March of this year.  She returned to podiatry in 10/25  and reported feeling about 60% better and received second round of cortisone injections.   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   Shoe wear is a thick sole tennis shoe.  She reports having tried many different types and brands of shoes and spent a lot money to still have the pain that she has.  States the pain is worst after a 12 hour work shift.  She reports the pain is mainly over the medial plantar heel, but can go into the arch as pain worsens. R foot is worse than the L.   She also Gets B calf fatigue, soreness, and cramping the longer she is on her feet.    PAIN: Are you having pain? Yes: NPRS scale: 2/10 now;  worst over last week 7/10 Pain location: B medial heel/arches Pain description: constant pain now; constant dull ache all day progressing to sharp and tingling in toes sometimes Aggravating factors: walking on TM for exercise, but can't due to pain Relieving factors: NWB positions  PERTINENT HISTORY:  Anxiety, depression, asthma  PRECAUTIONS: None  RED FLAGS: None  WEIGHT BEARING RESTRICTIONS: No  FALLS:  Has patient fallen in last 6 months? 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 Usual hobbies, recreational or sporting activities 3  3. Getting into/out of the bath 4  4. Walking between rooms 2  5. Putting on socks/shoes 2  6. Squatting  3  7. Lifting an object, like a bag of groceries from the floor 3  8. Performing light activities around your home 3  9. Performing heavy activities around your home 2  10. Getting into/out of a car 4  11. Walking 2 blocks 3  12. Walking 1 mile 1  13. Going up/down 10 stairs (1 flight) 1  14. Standing for 1 hour 1  15.  sitting for 1 hour 3  16. Running on even ground 1  17. Running on uneven ground 1  18. Making sharp turns while running fast 1  19. Hopping  1  20. Rolling over in bed 4  Score total:  44     COGNITION: Overall cognitive status: Within functional limits for tasks assessed    SENSATION: WFL  EDEMA:  none  POSTURE:  Supine:  L malleolus is longer;   long sit test = no change.   Standing:  R foot supinates, L foot pronates  PALPATION: TTP over the medial plantar heel at p. Fascia insertion BLE.  Also TTP in both arches R >L  MUSCLE LENGTH: Heelcord: tight BLE  LOWER EXTREMITY ROM:  Active ROM Right eval Left eval R 09/19/24 L 09/19/24  Hip flexion      Hip extension      Hip abduction      Hip adduction      Hip internal rotation  Hip external rotation      Knee flexion      Knee extension      Ankle dorsiflexion 5 5 7  -5  Ankle plantarflexion 40+ 40+ 62 60+  Ankle inversion 40 40 30 39  Ankle eversion 20 26 26 26     LOWER EXTREMITY MMT:  p! = pain  MMT Right eval Left eval  Hip flexion    Hip extension    Hip abduction    Hip adduction    Hip internal rotation    Hip external rotation    Knee flexion     Knee extension    Ankle dorsiflexion 4 4+  Ankle plantarflexion 5; p! 5  Ankle inversion 4+ 4+  Ankle eversion 4 4   (Blank rows = not tested)  LOWER EXTREMITY SPECIAL TESTS:  Ankle special tests: Great toe extension test: negative  Prone in subtalar neutral:  R rearfoot varus of at least 5-8 degrees with forefoot varus of 1-2 degrees       L rearfoot is 0 deg or even a little valgus appearing; forefoot varus of 2-3 deg First ray on R is a little plantarflexed, but mobile Great toe extension is 70 deg on R  FUNCTIONAL TESTS:  N/a  GAIT: Distance walked: into clinic 200' Assistive device utilized: None Level of assistance: Complete Independence Gait pattern: supinates on R, pronates on the L; toes out more on the L Comments:    TODAY'S TREATMENT:  09/21/24 THERAPEUTIC EXERCISE: To improve strength and endurance.  Demonstration, verbal and tactile cues throughout for technique. Bike L4 x 6'  THERAPEUTIC ACTIVITIES: To improve functional performance.  Demonstration, verbal and tactile cues throughout for technique. Standing gastroc stretch x 1' x 3 BLE Standing soleus stretch x 1' x 3 BLE Prostretch x 1' x 3 BLE  SELF CARE: Provided education on pain management options. Reviewed how to tape her plantar fascia with proper technique and foot positioning and no tension on the tape. Reviewed fan cut technique and I strip technique with placement  MANUAL THERAPY: To promote reduced pain utilizing myofascial release. Deep pressure MFR to p. Fascia BLE KT tape to B p. Fascia with fan cut I strip along the fascia longitudinally, and short I strip from the arch around to the dorsum of the foot  no tension  09/19/24 Bike L4x84min Measured ankle ROM Gastroc stretch on foam roll 2x44min; soleus stretch 2x33min BLE Seated B ankle EV,IV,DF x 10 RTB  Seated plantar fascia stretch on ground x 30' B Standing plantar fascia stretch on ground x 30' B Seated hip ABD blue TB x  20  09/16/24 Bike L2x8min BAPS level 3 BLE: Ankle PF/DF 2x10 EV/IV 2x10 CW/CCW circles 2x10 Seated ankle PF with GTB x 20 BLE IASTM with edge tool to B planatr fascia, medial calcaneus, and to medial arch Toe curl+ PF GTB x 20      09/12/24 Recumbent Bike L4x43min STM to B planatr fascia, medial calcaneus, and to medial arch Longsitting plantar fascia stretch 2 x 1 min B Seated toe curls B x 20 Seated ankle EV YTB x 20 B; DF YTB x 20 B   09/09/24 Seated B heel/toe raises x 20 Seated ankle EV/IV x 10 R/L Seated toe curls x 20 R/L R/L gastroc stretch long sitting x 1 min Standing gastroc stretch on 1/2 foam roll x 1 min BLE Standing soleus stretch on 1/2 foam roll x 1 min BLE STM to B planatr fascia, medial calcaneus, and  to medial arch Longsitting plantar fascia stretch x 1 min  09/06/24 SELF CARE: Provided education on PT POC progression;  initial HEP   PATIENT EDUCATION:  Education details: PT eval findings, anticipated POC, and initial HEP  Person educated: Patient Education method: Explanation, Demonstration, Verbal cues, Tactile cues, Handouts, and MedBridgeGO app access provided Education comprehension: verbalized understanding, verbal cues required, tactile cues required, and needs further education  HOME EXERCISE PROGRAM: Access Code: [email or web address] URL: [email or web address] Date: 09/19/2024 Prepared by: Sol Gaskins  Exercises - Long Sitting Calf Stretch with Strap  - 1 x daily - 7 x weekly - 1 sets - 2 reps - 1 min hold - Standing Soleus Stretch on Step  - 1 x daily - 7 x weekly - 1 sets - 2 reps - 1 min hold - Standing Gastroc Stretch on Foam 1/2 Roll  - 1 x daily - 7 x weekly - 1 sets - 2 reps - 1 min hold - Standing Plantar Fascia Mobilization with Small Ball  - 1 x daily - 7 x weekly - 1 sets - 1 reps - Long Sitting Plantar Fascia Stretch with Towel  - 1 x daily - 7 x weekly - 2 sets - 2 reps - 1 min hold - CLX Ankle Dorsiflexion and  Eversion  - 1 x daily - 7 x weekly - 2 sets - 10 reps - Seated Ankle Dorsiflexion with Resistance  - 1 x daily - 7 x weekly - 2 sets - 10 reps - Ankle and Toe Plantarflexion with Resistance  - 1 x daily - 7 x weekly - 2 sets - 10 reps - Seated Ankle Inversion with Resistance  - 1 x daily - 7 x weekly - 2 sets - 10 reps   ASSESSMENT:  CLINICAL IMPRESSION:   Patient has some very small nodular spots in the R plantar fascia insertion, but not many.  L plantar fascia is less so.  She tolerates deep MFR to the heel well today as well as prolonged stretching.  She is using a night splint sock now which seems to be helping.  She is making some modest improvements in her pain, but goal is to be able to work without the pain.   She continues to require PT for manual techniques to the plantar fascia with strengthening and stretching of her foot and ankle for pain relief and reducing irritation of the soft tissue around the plantar heel.  Continue per POC  Brianna Barr is a 28 y.o. female who was referred to physical therapy for evaluation and treatment for BLE plantar fasciitis.    Patient reports onset of B heel/arch pain beginning several years ago.  The problem got new custom orthotics this spring and cortisone shots in the spring and again last month which have helped.   Pain is worse after working 12 hour shifts on the floor at [employer].  Patient does have considerable calf tightness BLE.   She also has a longer LLE and is advised to try a shoe lift for the shorter RLE to see if it would help.   Patient has deficits in B LE flexibility, BLE strength, abnormal posture, and TTP with pain at the plantar fascia insertion of both heels which are interfering with ADLs and are impacting quality of life.  On LEFS patient scored 44/80 demonstrating 45% functional limitation.  Brianna Barr will benefit from skilled PT to address above deficits to improve mobility and activity tolerance with decreased pain  interference.  OBJECTIVE IMPAIRMENTS: difficulty walking, decreased ROM, decreased strength, impaired flexibility, and pain.   ACTIVITY LIMITATIONS: standing, stairs, and locomotion level  PARTICIPATION LIMITATIONS: shopping, community activity, and occupation  PERSONAL FACTORS: Fitness, Time since onset of injury/illness/exacerbation, and 1-2 comorbidities: Anxiety, depression, asthma are also affecting patient's functional outcome.   REHAB POTENTIAL: Good  CLINICAL DECISION MAKING: Evolving/moderate complexity  EVALUATION COMPLEXITY: Moderate   GOALS: Goals reviewed with patient? Yes  SHORT TERM GOALS: Target date: 10/04/2024   Patient will be independent with initial HEP. Baseline: 100% PT assist required for correct completion Goal status: MET- 09/16/24  2.  Patient will report at least 25% improvement in B heel pain to improve QOL. Baseline: 8/10 worst Goal status: MET- 09/19/24     LONG TERM GOALS: Target date: 11/01/2024   Patient will be independent with advanced/ongoing HEP to improve outcomes and carryover.  Baseline: no advanced HEP yet Goal status: INITIAL  2.  Patient will report at least 50-75% improvement in B heel pain to improve QOL. Baseline: 8/10 worst Goal status: IN PROGRESS- 09/19/24 40% improvement  3.  Patient will demonstrate improved B ankle ROM to WNL to allow for normal gait and stair mechanics. Baseline: Refer to above LE ROM table Goal status: IN PROGRESS- 09/19/24 see table  4.  Patient will demonstrate improved B ankle strength to >/= 5/5 for improved stability and ease of mobility. Baseline: Refer to above LE MMT table Goal status: INITIAL  5.  Patient will be able to ambulate 5-10 hrs at work with normal gait pattern without increased pain to access community.  Baseline: pain throughout the shift, but she pushes through it Goal status: INITIAL  6. Patient will be able to ascend/descend stairs with 1 HR and reciprocal step pattern  safely to access home and community.  Baseline: painful but does it Goal status: INITIAL  7.  Patient will report >/= 60/80 on LEFS (MCID = 9 pts) to demonstrate improved functional ability. Baseline: 44/80 Goal status: INITIAL  8.  Patient will demonstrate at least 19/24 on DGI to decrease risk of falls. Baseline: TBD Goal status: INITIAL   PLAN:  PT FREQUENCY: 1-2x/week  PT DURATION: 8 weeks  PLANNED INTERVENTIONS: {02835- PT Re-evaluation, 97750- Physical Performance Testing, 97110-Therapeutic exercises, 97530- Therapeutic activity, [id number]- Neuromuscular re-education, 97535- Self Care, [id number]- Manual therapy, G0283- Electrical stimulation (unattended), 97016- Vasopneumatic device, [id number]- Ultrasound, 79439 (1-2 muscles), 20561 (3+ muscles)- Dry Needling, Patient/Family education, Balance training, Taping, Joint mobilization, Cryotherapy, and Moist heat  PLAN FOR NEXT SESSION: check LEFS and strength, hip strengthening; arch strengthening activities; do MFR manually and with other materials PRN to B heels and arches/p fascia; continue stretching w/ prostretch and calf/soleus  Asma Boldon, PT 09/21/2024, 7:29 PM

## 2024-09-26 ENCOUNTER — Ambulatory Visit

## 2024-09-26 DIAGNOSIS — J3089 Other allergic rhinitis: Secondary | ICD-10-CM | POA: Diagnosis not present

## 2024-09-26 DIAGNOSIS — J302 Other seasonal allergic rhinitis: Secondary | ICD-10-CM

## 2024-09-26 DIAGNOSIS — M6289 Other specified disorders of muscle: Secondary | ICD-10-CM | POA: Insufficient documentation

## 2024-09-26 DIAGNOSIS — M722 Plantar fascial fibromatosis: Secondary | ICD-10-CM | POA: Insufficient documentation

## 2024-09-26 DIAGNOSIS — M79672 Pain in left foot: Secondary | ICD-10-CM | POA: Diagnosis present

## 2024-09-26 DIAGNOSIS — M6281 Muscle weakness (generalized): Secondary | ICD-10-CM | POA: Insufficient documentation

## 2024-09-26 DIAGNOSIS — R262 Difficulty in walking, not elsewhere classified: Secondary | ICD-10-CM | POA: Diagnosis present

## 2024-09-26 DIAGNOSIS — M79671 Pain in right foot: Secondary | ICD-10-CM | POA: Insufficient documentation

## 2024-09-26 NOTE — Therapy (Signed)
 OUTPATIENT PHYSICAL THERAPY LOWER EXTREMITY TREATMENT   Brianna Barr Name: Brianna Barr MRN: [id number] DOB: Jun 24, 1996, 28 y.o., female Today's Date: 09/26/2024   END OF SESSION:  Brianna Barr End of Session - 09/26/24 1014     Visit Number 7    Date for Recertification  11/01/24    Brianna Barr Start Time 0932    Brianna Barr Stop Time 1015    Brianna Barr Time Calculation (min) 43 min                Past Medical History:  Diagnosis Date   Anxiety    Asthma    Depression    Overactive bladder    Stress incontinence    Past Surgical History:  Procedure Laterality Date   WISDOM TOOTH EXTRACTION     Brianna Barr Active Problem List   Diagnosis Date Noted   Seasonal and perennial allergic rhinitis 02/18/2024   Allergic rhinitis due to allergen 08/20/2023   Allergic conjunctivitis of both eyes 08/20/2023   Anxiety and depression 07/07/2019   Mild intermittent asthma, uncomplicated 07/07/2019   Seasonal allergies 07/07/2019   Plantar fasciitis 07/07/2019   Hyperhidrosis 07/07/2019   OAB (overactive bladder) 07/07/2019   Allergy  08/06/2018    PCP: Mercer Clotilda SAUNDERS, MD   REFERRING PROVIDER: Loel Awanda BIRCH, DPM   REFERRING DIAG: M72.2 (ICD-10-CM) - Plantar fasciitis M79.671,M79.672 (ICD-10-CM) - Pain in both feet M62.89 (ICD-10-CM) - Tightness of both gastrocnemius muscles  THERAPY DIAG:  Plantar fasciitis  Pain in both feet  Tightness of both gastrocnemius muscles  Difficulty in walking, not elsewhere classified  Muscle weakness (generalized)  RATIONALE FOR EVALUATION AND TREATMENT: Rehabilitation  ONSET DATE: several years ago  NEXT MD VISIT:    SUBJECTIVE:                                                                                                                                                                                                         SUBJECTIVE STATEMENT: Brianna Barr worked last night, took meloxicam  while at work this morning so that helped with pain   28 y/o Brianna Barr  referred to Brianna Barr from Dr Loel (Podiatry) for B plantar fasciitis.   Brianna Barr reports long h/o plantar fasciitis in both feet since 2020.    Brianna Barr received cortisone injections and custom orthotics BLE in March of this year.  Brianna Barr returned to podiatry in 10/25 and reported feeling about 60% better and received second round of cortisone injections.   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   Shoe wear is a thick sole tennis shoe.  Brianna Barr reports having tried many different types and brands of shoes and spent a lot money to still have the pain that Brianna Barr has.  States the pain is worst after a 12 hour work shift.  Brianna Barr reports the pain is mainly over the medial plantar heel, but can go into the arch as pain worsens. R foot is worse than the L.   Brianna Barr also Gets B calf fatigue, soreness, and cramping the longer Brianna Barr is on her feet.    PAIN: Are you having pain? Yes: NPRS scale: 4/10 now;  worst over last week 7/10 Pain location: B medial heel/arches Pain description: constant pain now; constant dull ache all day progressing to sharp and tingling in toes sometimes Aggravating factors: walking on TM for exercise, but can't due to pain Relieving factors: NWB positions  PERTINENT HISTORY:  Anxiety, depression, asthma  PRECAUTIONS: None  RED FLAGS: None  WEIGHT BEARING RESTRICTIONS: No  FALLS:  Has Brianna Barr fallen in last 6 months? 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 Usual hobbies, recreational or sporting activities 3  3. Getting into/out of the bath 4  4. Walking between rooms 2  5. Putting on socks/shoes 2  6. Squatting  3  7. Lifting an object, like a bag of groceries from the floor 3  8. Performing light activities around your home 3  9. Performing heavy activities around your home 2  10. Getting into/out of a car 4  11. Walking 2 blocks 3  12. Walking 1 mile 1  13. Going up/down 10 stairs (1 flight) 1  14. Standing for 1 hour 1  15.  sitting for 1 hour 3  16. Running on even ground 1  17. Running on uneven ground 1  18. Making sharp turns while running fast 1  19. Hopping  1  20. Rolling over in bed 4  Score total:  44     COGNITION: Overall cognitive status: Within functional limits for tasks assessed    SENSATION: WFL  EDEMA:  none  POSTURE:  Supine:  L malleolus is longer;   long sit test = no change.   Standing:  R foot supinates, L foot pronates  PALPATION: TTP over the medial plantar heel at p. Fascia insertion BLE.  Also TTP in both arches R >L  MUSCLE LENGTH: Heelcord: tight BLE  LOWER EXTREMITY ROM:  Active ROM Right eval Left eval R 09/19/24 L 09/19/24  Hip flexion      Hip extension      Hip abduction      Hip adduction      Hip internal rotation      Hip external rotation      Knee flexion      Knee extension      Ankle dorsiflexion 5 5 7  -5  Ankle plantarflexion 40+ 40+ 62  60+  Ankle inversion 40 40 30 39  Ankle eversion 20 26 26 26     LOWER EXTREMITY MMT:  p! = pain  MMT Right eval Left eval R 09/26/24 L 09/26/24  Hip flexion      Hip extension      Hip abduction      Hip adduction      Hip internal rotation      Hip external rotation      Knee flexion      Knee extension      Ankle dorsiflexion 4 4+ 4+ 5  Ankle plantarflexion 5; p! 5    Ankle inversion 4+ 4+ 4+ 4+  Ankle  eversion 4 4 5  4+   (Blank rows = not tested)  LOWER EXTREMITY SPECIAL TESTS:  Ankle special tests: Great toe extension test: negative  Prone in subtalar neutral:  R rearfoot varus of at least 5-8 degrees with forefoot varus of 1-2 degrees       L rearfoot is 0 deg or even a little valgus appearing; forefoot varus of 2-3 deg First ray on R is a little plantarflexed, but mobile Great toe extension is 70 deg on R  FUNCTIONAL TESTS:  N/a  GAIT: Distance walked: into clinic 200' Assistive device utilized: None Level of assistance: Complete Independence Gait pattern: supinates on R, pronates on the L; toes out more on the L Comments:    TODAY'S TREATMENT:  09/26/24 THERAPEUTIC EXERCISE: To improve strength and endurance.  Demonstration, verbal and tactile cues throughout for technique. Bike L3 x 6' Tested strength B ankles Plantar fascia stretch 2x52min BLE with towel Prostretch for gastroc x 1' BLE    MANUAL THERAPY: To promote reduced pain utilizing myofascial release. KT tape to B plantar fascia one strip from P fascia to achilles, 2 horizontal strips going medial to lateral on plantar surface of foot  09/21/24 THERAPEUTIC EXERCISE: To improve strength and endurance.  Demonstration, verbal and tactile cues throughout for technique. Bike L4 x 6'  THERAPEUTIC ACTIVITIES: To improve functional performance.  Demonstration, verbal and tactile cues throughout for technique. Standing gastroc stretch x 1' x 3 BLE Standing soleus stretch x 1' x 3 BLE Prostretch x 1' x 3 BLE  SELF CARE: Provided Barr on pain management options. Reviewed how to tape her plantar fascia with proper technique and foot positioning and no tension on the tape. Reviewed fan cut technique and I strip technique with placement  MANUAL THERAPY: To promote reduced pain utilizing myofascial release. Deep pressure MFR to p. Fascia BLE KT tape to B p. Fascia with fan cut I strip along the fascia  longitudinally, and short I strip from the arch around to the dorsum of the foot  no tension  09/19/24 Bike L4x44min Measured ankle ROM Gastroc stretch on foam roll 2x68min; soleus stretch 2x63min BLE Seated B ankle EV,IV,DF x 10 RTB  Seated plantar fascia stretch on ground x 30' B Standing plantar fascia stretch on ground x 30' B Seated hip ABD blue TB x 20  09/16/24 Bike L2x90min BAPS level 3 BLE: Ankle PF/DF 2x10 EV/IV 2x10 CW/CCW circles 2x10 Seated ankle PF with GTB x 20 BLE IASTM with edge tool to B planatr fascia, medial calcaneus, and to medial arch Toe curl+ PF GTB x 20      09/12/24 Recumbent Bike L4x40min STM to B planatr fascia, medial calcaneus, and to medial arch Longsitting plantar fascia stretch 2 x 1 min B Seated toe curls B x 20 Seated  ankle EV YTB x 20 B; DF YTB x 20 B   09/09/24 Seated B heel/toe raises x 20 Seated ankle EV/IV x 10 R/L Seated toe curls x 20 R/L R/L gastroc stretch long sitting x 1 min Standing gastroc stretch on 1/2 foam roll x 1 min BLE Standing soleus stretch on 1/2 foam roll x 1 min BLE STM to B planatr fascia, medial calcaneus, and to medial arch Longsitting plantar fascia stretch x 1 min  09/06/24 SELF CARE: Provided Barr on Brianna Barr POC progression;  initial HEP   Brianna Barr:  Barr details: Brianna Barr eval findings, anticipated POC, and initial HEP  Person educated: Brianna Barr method: Explanation, Demonstration, Verbal cues, Tactile cues, Handouts, and MedBridgeGO app access provided Barr comprehension: verbalized understanding, verbal cues required, tactile cues required, and needs further Barr  HOME EXERCISE PROGRAM: Access Code: [email or web address] URL: [email or web address] Date: 09/19/2024 Prepared by: Sol Gaskins  Exercises - Long Sitting Calf Stretch with Strap  - 1 x daily - 7 x weekly - 1 sets - 2 reps - 1 min hold - Standing Soleus Stretch on Step  - 1 x daily - 7 x weekly - 1 sets -  2 reps - 1 min hold - Standing Gastroc Stretch on Foam 1/2 Roll  - 1 x daily - 7 x weekly - 1 sets - 2 reps - 1 min hold - Standing Plantar Fascia Mobilization with Small Ball  - 1 x daily - 7 x weekly - 1 sets - 1 reps - Long Sitting Plantar Fascia Stretch with Towel  - 1 x daily - 7 x weekly - 2 sets - 2 reps - 1 min hold - CLX Ankle Dorsiflexion and Eversion  - 1 x daily - 7 x weekly - 2 sets - 10 reps - Seated Ankle Dorsiflexion with Resistance  - 1 x daily - 7 x weekly - 2 sets - 10 reps - Ankle and Toe Plantarflexion with Resistance  - 1 x daily - 7 x weekly - 2 sets - 10 reps - Seated Ankle Inversion with Resistance  - 1 x daily - 7 x weekly - 2 sets - 10 reps   ASSESSMENT:  CLINICAL IMPRESSION:   Continued KT tape to reduce tension on plantar fascia. Continued stretching as well to reduce tissue tension and pain. Brianna Barr responded well, continue with stretching, MFR and taping as needed.  Mccayla Dionne Bamburg is a 28 y.o. female who was referred to physical therapy for evaluation and treatment for BLE plantar fasciitis.    Brianna Barr reports onset of B heel/arch pain beginning several years ago.  The problem got new custom orthotics this spring and cortisone shots in the spring and again last month which have helped.   Pain is worse after working 12 hour shifts on the floor at [employer].  Brianna Barr does have considerable calf tightness BLE.   Brianna Barr also has a longer LLE and is advised to try a shoe lift for the shorter RLE to see if it would help.   Brianna Barr has deficits in B LE flexibility, BLE strength, abnormal posture, and TTP with pain at the plantar fascia insertion of both heels which are interfering with ADLs and are impacting quality of life.  On LEFS Brianna Barr scored 44/80 demonstrating 45% functional limitation.  Amiyah will benefit from skilled Brianna Barr to address above deficits to improve mobility and activity tolerance with decreased pain interference.  OBJECTIVE IMPAIRMENTS: difficulty walking,  decreased ROM, decreased strength, impaired flexibility, and pain.  ACTIVITY LIMITATIONS: standing, stairs, and locomotion level  PARTICIPATION LIMITATIONS: shopping, community activity, and occupation  PERSONAL FACTORS: Fitness, Time since onset of injury/illness/exacerbation, and 1-2 comorbidities: Anxiety, depression, asthma are also affecting Brianna Barr's functional outcome.   REHAB POTENTIAL: Good  CLINICAL DECISION MAKING: Evolving/moderate complexity  EVALUATION COMPLEXITY: Moderate   GOALS: Goals reviewed with Brianna Barr? Yes  SHORT TERM GOALS: Target date: 10/04/2024   Brianna Barr will be independent with initial HEP. Baseline: 100% Brianna Barr assist required for correct completion Goal status: MET- 09/16/24  2.  Brianna Barr will report at least 25% improvement in B heel pain to improve QOL. Baseline: 8/10 worst Goal status: MET- 09/19/24     LONG TERM GOALS: Target date: 11/01/2024   Brianna Barr will be independent with advanced/ongoing HEP to improve outcomes and carryover.  Baseline: no advanced HEP yet Goal status: INITIAL  2.  Brianna Barr will report at least 50-75% improvement in B heel pain to improve QOL. Baseline: 8/10 worst Goal status: IN PROGRESS- 09/19/24 40% improvement  3.  Brianna Barr will demonstrate improved B ankle ROM to WNL to allow for normal gait and stair mechanics. Baseline: Refer to above LE ROM table Goal status: IN PROGRESS- 09/19/24 see table  4.  Brianna Barr will demonstrate improved B ankle strength to >/= 5/5 for improved stability and ease of mobility. Baseline: Refer to above LE MMT table Goal status: INITIAL  5.  Brianna Barr will be able to ambulate 5-10 hrs at work with normal gait pattern without increased pain to access community.  Baseline: pain throughout the shift, but Brianna Barr pushes through it Goal status: INITIAL  6. Brianna Barr will be able to ascend/descend stairs with 1 HR and reciprocal step pattern safely to access home and community.  Baseline: painful but  does it Goal status: INITIAL  7.  Brianna Barr will report >/= 60/80 on LEFS (MCID = 9 pts) to demonstrate improved functional ability. Baseline: 44/80 Goal status: INITIAL  8.  Brianna Barr will demonstrate at least 19/24 on DGI to decrease risk of falls. Baseline: TBD Goal status: INITIAL   PLAN:  Brianna Barr FREQUENCY: 1-2x/week  Brianna Barr DURATION: 8 weeks  PLANNED INTERVENTIONS: {02835- Brianna Barr Re-evaluation, 97750- Physical Performance Testing, 97110-Therapeutic exercises, 97530- Therapeutic activity, [id number]- Neuromuscular re-Barr, 97535- Self Care, [id number]- Manual therapy, [id number]- Electrical stimulation (unattended), 97016- Vasopneumatic device, [id number]- Ultrasound, 79439 (1-2 muscles), 20561 (3+ muscles)- Dry Needling, Brianna Barr/Family Barr, Balance training, Taping, Joint mobilization, Cryotherapy, and Moist heat  PLAN FOR NEXT SESSION: check LEFS and strength, hip strengthening; arch strengthening activities; do MFR manually and with other materials PRN to B heels and arches/p fascia; continue stretching w/ prostretch and calf/soleus  Sol LITTIE Gaskins, PTA 09/26/2024, 10:22 AM

## 2024-09-29 ENCOUNTER — Encounter: Payer: Self-pay | Admitting: Rehabilitation

## 2024-09-29 ENCOUNTER — Ambulatory Visit: Admitting: Rehabilitation

## 2024-09-29 DIAGNOSIS — M722 Plantar fascial fibromatosis: Secondary | ICD-10-CM | POA: Diagnosis not present

## 2024-09-29 DIAGNOSIS — M79671 Pain in right foot: Secondary | ICD-10-CM

## 2024-09-29 DIAGNOSIS — R262 Difficulty in walking, not elsewhere classified: Secondary | ICD-10-CM

## 2024-09-29 DIAGNOSIS — M6289 Other specified disorders of muscle: Secondary | ICD-10-CM

## 2024-09-29 NOTE — Therapy (Addendum)
 OUTPATIENT PHYSICAL THERAPY LOWER EXTREMITY TREATMENT   Patient Name: Brianna Barr MRN: [id number] DOB: 11/03/1995, 28 y.o., female Today's Date: 09/30/2024   END OF SESSION:  PT End of Session - 09/29/24 0942     Visit Number 8    Date for Recertification  11/01/24    PT Start Time 0940    PT Stop Time 1020    PT Time Calculation (min) 40 min    Activity Tolerance Patient tolerated treatment well;No increased pain    Behavior During Therapy WFL for tasks assessed/performed                Past Medical History:  Diagnosis Date   Anxiety    Asthma    Depression    Overactive bladder    Stress incontinence    Past Surgical History:  Procedure Laterality Date   WISDOM TOOTH EXTRACTION     Patient Active Problem List   Diagnosis Date Noted   Seasonal and perennial allergic rhinitis 02/18/2024   Allergic rhinitis due to allergen 08/20/2023   Allergic conjunctivitis of both eyes 08/20/2023   Anxiety and depression 07/07/2019   Mild intermittent asthma, uncomplicated 07/07/2019   Seasonal allergies 07/07/2019   Plantar fasciitis 07/07/2019   Hyperhidrosis 07/07/2019   OAB (overactive bladder) 07/07/2019   Allergy  08/06/2018    PCP: Mercer Clotilda SAUNDERS, MD   REFERRING PROVIDER: Loel Awanda BIRCH, DPM   REFERRING DIAG: M72.2 (ICD-10-CM) - Plantar fasciitis M79.671,M79.672 (ICD-10-CM) - Pain in both feet M62.89 (ICD-10-CM) - Tightness of both gastrocnemius muscles  THERAPY DIAG:  Plantar fasciitis  Pain in both feet  Tightness of both gastrocnemius muscles  Difficulty in walking, not elsewhere classified  RATIONALE FOR EVALUATION AND TREATMENT: Rehabilitation  ONSET DATE: several years ago  NEXT MD VISIT:    SUBJECTIVE:                                                                                                                                                                                                         SUBJECTIVE STATEMENT:  States  feeling ok this am.  Works tonight which is still the most painful for her.  She has her custom orthotics in her work shoes   28 y/o patient referred to PT from Dr Loel (Podiatry) for B plantar fasciitis.   Patient reports long h/o plantar fasciitis in both feet since 2020.    She received cortisone injections and custom orthotics BLE in March of this year.  She returned to podiatry in 10/25 and reported feeling about 60% better and  received second round of cortisone injections.   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   Shoe wear is a thick sole tennis shoe.  She reports having tried many different types and brands of shoes and spent a lot money to still have the pain that she has.  States the pain is worst after a 12 hour work shift.  She reports the pain is mainly over the medial plantar heel, but can go into the arch as pain worsens. R foot is worse than the L.   She also Gets B calf fatigue, soreness, and cramping the longer she is on her feet.    PAIN: Are you having pain? Yes: NPRS scale: 4/10 now;  worst over last week 7/10 Pain location: B medial heel/arches Pain description: constant pain now; constant dull ache all day progressing to sharp and tingling in toes sometimes Aggravating factors: walking on TM for exercise, but can't due to pain Relieving factors: NWB positions  PERTINENT HISTORY:  Anxiety, depression, asthma  PRECAUTIONS: None  RED FLAGS: None  WEIGHT BEARING RESTRICTIONS: No  FALLS:  Has patient fallen in last 6 months? 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 Usual hobbies, recreational or sporting activities 3 2  3. Getting into/out of the bath 4 3  4. Walking between rooms 2 2  5. Putting on socks/shoes 2 2  6. Squatting  3 3  7. Lifting an object, like a bag of groceries from the floor 3 3  8. Performing light activities around your home 3 3  9. Performing heavy activities around your home 2 1  10. Getting into/out of a car 4 2  11. Walking 2 blocks 3 2  12. Walking 1 mile 1 1  13. Going up/down 10 stairs (1 flight) 1 2  14. Standing for 1 hour 1 1  15.  sitting for 1 hour 3 4  16. Running on even ground 1 1  17. Running on uneven ground 1 1  18. Making sharp turns while running fast 1 2  19. Hopping  1 1  20. Rolling over in bed 4 4  Score total:  44 42     COGNITION: Overall cognitive status: Within functional limits for tasks assessed    SENSATION: WFL  EDEMA:  none  POSTURE:  Supine:  L malleolus is longer;   long sit test = no change.   Standing:  R foot supinates, L foot pronates  PALPATION: TTP over the medial plantar heel at p. Fascia insertion BLE.  Also TTP in both arches R >L  MUSCLE LENGTH: Heelcord: tight BLE  LOWER EXTREMITY ROM:  Active ROM Right eval Left eval R 09/19/24 L 09/19/24  Hip flexion      Hip extension      Hip abduction  Hip adduction      Hip internal rotation      Hip external rotation      Knee flexion      Knee extension      Ankle dorsiflexion 5 5 7  -5  Ankle plantarflexion 40+ 40+ 62 60+  Ankle inversion 40 40 30 39  Ankle eversion 20 26 26 26     LOWER EXTREMITY MMT:  p! = pain  MMT Right eval Left eval R 09/26/24 L 09/26/24  Hip flexion      Hip extension      Hip abduction      Hip adduction      Hip  internal rotation      Hip external rotation      Knee flexion      Knee extension      Ankle dorsiflexion 4 4+ 4+ 5  Ankle plantarflexion 5; p! 5    Ankle inversion 4+ 4+ 4+ 4+  Ankle eversion 4 4 5  4+   (Blank rows = not tested)  LOWER EXTREMITY SPECIAL TESTS:  Ankle special tests: Great toe extension test: negative  Prone in subtalar neutral:  R rearfoot varus of at least 5-8 degrees with forefoot varus of 1-2 degrees       L rearfoot is 0 deg or even a little valgus appearing; forefoot varus of 2-3 deg First ray on R is a little plantarflexed, but mobile Great toe extension is 70 deg on R  FUNCTIONAL TESTS:  N/a  GAIT: Distance walked: into clinic 200' Assistive device utilized: None Level of assistance: Complete Independence Gait pattern: supinates on R, pronates on the L; toes out more on the L Comments:    TODAY'S TREATMENT:  09/29/24 THERAPEUTIC EXERCISE: To improve strength and endurance.  Demonstration, verbal and tactile cues throughout for technique. Bike L4 x 6'  THERAPEUTIC ACTIVITIES: To improve functional performance.  Demonstration, verbal and tactile cues throughout for technique. Marble pickup and drop in container B feet x 5-6' Great toe extension stretching w/ towel x 1' x 2 BLE Seated PF towel stretch x 1' x 2 BLE Applied a 1/8 rubber strip w/ leukotape to the medial aspect of patient's removable inserts on her R shoe (old shoes)   MANUAL THERAPY: To promote improved flexibility and reduced pain utilizing myofascial release. Deep pressure MFR, stripping to the P. Fascia from heel to metatarsal heads KT tape w/ 1 long I strip from mid plantar foot to mid gastroc w/ 25% stretch placed in ankle PF BLE; 2 short stirrup I strips at 50% tension around heel and arch.    MODALITIES:   100% US  @ 1 Mhz x 1.4 w/cm2 x 8' to R plantar fascia for pain relief and heating tissue prior to [facility] for increased effectiveness of MFR  09/26/24 THERAPEUTIC EXERCISE: To  improve strength and endurance.  Demonstration, verbal and tactile cues throughout for technique. Bike L3 x 6' Tested strength B ankles Plantar fascia stretch 2x31min BLE with towel Prostretch for gastroc x 1' BLE    MANUAL THERAPY: To promote reduced pain utilizing myofascial release. KT tape to B plantar fascia one strip from P fascia to achilles, 2 horizontal strips going medial to lateral on plantar surface of foot  09/21/24 THERAPEUTIC EXERCISE: To improve strength and endurance.  Demonstration, verbal and tactile cues throughout for technique. Bike L4 x 6'  THERAPEUTIC ACTIVITIES: To improve functional performance.  Demonstration, verbal and tactile cues throughout for technique. Standing gastroc stretch x 1' x  3 BLE Standing soleus stretch x 1' x 3 BLE Prostretch x 1' x 3 BLE  SELF CARE: Provided education on pain management options. Reviewed how to tape her plantar fascia with proper technique and foot positioning and no tension on the tape. Reviewed fan cut technique and I strip technique with placement  MANUAL THERAPY: To promote reduced pain utilizing myofascial release. Deep pressure MFR to p. Fascia BLE KT tape to B p. Fascia with fan cut I strip along the fascia longitudinally, and short I strip from the arch around to the dorsum of the foot  no tension  09/19/24 Bike L4x48min Measured ankle ROM Gastroc stretch on foam roll 2x55min; soleus stretch 2x28min BLE Seated B ankle EV,IV,DF x 10 RTB  Seated plantar fascia stretch on ground x 30' B Standing plantar fascia stretch on ground x 30' B Seated hip ABD blue TB x 20  09/16/24 Bike L2x7min BAPS level 3 BLE: Ankle PF/DF 2x10 EV/IV 2x10 CW/CCW circles 2x10 Seated ankle PF with GTB x 20 BLE IASTM with edge tool to B planatr fascia, medial calcaneus, and to medial arch Toe curl+ PF GTB x 20      09/12/24 Recumbent Bike L4x79min STM to B planatr fascia, medial calcaneus, and to medial arch Longsitting plantar  fascia stretch 2 x 1 min B Seated toe curls B x 20 Seated ankle EV YTB x 20 B; DF YTB x 20 B   09/09/24 Seated B heel/toe raises x 20 Seated ankle EV/IV x 10 R/L Seated toe curls x 20 R/L R/L gastroc stretch long sitting x 1 min Standing gastroc stretch on 1/2 foam roll x 1 min BLE Standing soleus stretch on 1/2 foam roll x 1 min BLE STM to B planatr fascia, medial calcaneus, and to medial arch Longsitting plantar fascia stretch x 1 min  09/06/24 SELF CARE: Provided education on PT POC progression;  initial HEP   PATIENT EDUCATION:  Education details: adding marble pick up to HEP  Person educated: Patient Education method: Explanation, Demonstration, Verbal cues, Tactile cues, Handouts, and MedBridgeGO app access provided Education comprehension: verbalized understanding, verbal cues required, tactile cues required, and needs further education  HOME EXERCISE PROGRAM: Access Code: [email or web address] URL: [email or web address] Date: 09/19/2024 Prepared by: Sol Gaskins  Exercises - Long Sitting Calf Stretch with Strap  - 1 x daily - 7 x weekly - 1 sets - 2 reps - 1 min hold - Standing Soleus Stretch on Step  - 1 x daily - 7 x weekly - 1 sets - 2 reps - 1 min hold - Standing Gastroc Stretch on Foam 1/2 Roll  - 1 x daily - 7 x weekly - 1 sets - 2 reps - 1 min hold - Standing Plantar Fascia Mobilization with Small Ball  - 1 x daily - 7 x weekly - 1 sets - 1 reps - Long Sitting Plantar Fascia Stretch with Towel  - 1 x daily - 7 x weekly - 2 sets - 2 reps - 1 min hold - CLX Ankle Dorsiflexion and Eversion  - 1 x daily - 7 x weekly - 2 sets - 10 reps - Seated Ankle Dorsiflexion with Resistance  - 1 x daily - 7 x weekly - 2 sets - 10 reps - Ankle and Toe Plantarflexion with Resistance  - 1 x daily - 7 x weekly - 2 sets - 10 reps - Seated Ankle Inversion with Resistance  - 1 x daily - 7 x weekly - 2 sets -  10 reps   ASSESSMENT:  CLINICAL IMPRESSION:   Patient is seeing some  slight improvement.   It would be good for her to bring in her work shoes next visit so we can look at her custom orthotic.   Trialed some US  to heat her plantar fascia prior to MFR today to see if this would increase effectiveness.   Also tried a medial full length piece of rubber on the removable inserts from her old shoes today since she has no support in them.   Will reassess effects of these treatments.   She still has pain with nodular feeling in her R plantar fascia >L so focused MFR on the R foot today.   PT remains necessary for pain, strength, gait deficits.  Continue per POC  Brianna Barr is a 28 y.o. female who was referred to physical therapy for evaluation and treatment for BLE plantar fasciitis.    Patient reports onset of B heel/arch pain beginning several years ago.  The problem got new custom orthotics this spring and cortisone shots in the spring and again last month which have helped.   Pain is worse after working 12 hour shifts on the floor at [employer].  Patient does have considerable calf tightness BLE.   She also has a longer LLE and is advised to try a shoe lift for the shorter RLE to see if it would help.   Patient has deficits in B LE flexibility, BLE strength, abnormal posture, and TTP with pain at the plantar fascia insertion of both heels which are interfering with ADLs and are impacting quality of life.  On LEFS patient scored 44/80 demonstrating 45% functional limitation.  Brianna Barr will benefit from skilled PT to address above deficits to improve mobility and activity tolerance with decreased pain interference.  OBJECTIVE IMPAIRMENTS: difficulty walking, decreased ROM, decreased strength, impaired flexibility, and pain.   ACTIVITY LIMITATIONS: standing, stairs, and locomotion level  PARTICIPATION LIMITATIONS: shopping, community activity, and occupation  PERSONAL FACTORS: Fitness, Time since onset of injury/illness/exacerbation, and 1-2 comorbidities: Anxiety,  depression, asthma are also affecting patient's functional outcome.   REHAB POTENTIAL: Good  CLINICAL DECISION MAKING: Evolving/moderate complexity  EVALUATION COMPLEXITY: Moderate   GOALS: Goals reviewed with patient? Yes  SHORT TERM GOALS: Target date: 10/04/2024   Patient will be independent with initial HEP. Baseline: 100% PT assist required for correct completion Goal status: MET- 09/16/24  2.  Patient will report at least 25% improvement in B heel pain to improve QOL. Baseline: 8/10 worst Goal status: MET- 09/19/24     LONG TERM GOALS: Target date: 11/01/2024   Patient will be independent with advanced/ongoing HEP to improve outcomes and carryover.  Baseline: no advanced HEP yet Goal status: INITIAL  2.  Patient will report at least 50-75% improvement in B heel pain to improve QOL. Baseline: 8/10 worst Goal status: IN PROGRESS- 09/19/24 40% improvement  3.  Patient will demonstrate improved B ankle ROM to WNL to allow for normal gait and stair mechanics. Baseline: Refer to above LE ROM table Goal status: IN PROGRESS- 09/19/24 see table  4.  Patient will demonstrate improved B ankle strength to >/= 5/5 for improved stability and ease of mobility. Baseline: Refer to above LE MMT table Goal status: INITIAL  5.  Patient will be able to ambulate 5-10 hrs at work with normal gait pattern without increased pain to access community.  Baseline: pain throughout the shift, but she pushes through it Goal status: INITIAL  6.  Patient will be able to ascend/descend stairs with 1 HR and reciprocal step pattern safely to access home and community.  Baseline: painful but does it Goal status: INITIAL  7.  Patient will report >/= 60/80 on LEFS (MCID = 9 pts) to demonstrate improved functional ability. Baseline: 44/80 09/30/24: 42/80 Goal status: IN PROGRESS  8.  Patient will demonstrate at least 19/24 on DGI to decrease risk of falls. Baseline: TBD Goal status: INITIAL    PLAN:  PT FREQUENCY: 1-2x/week  PT DURATION: 8 weeks  PLANNED INTERVENTIONS: {02835- PT Re-evaluation, 97750- Physical Performance Testing, 97110-Therapeutic exercises, 97530- Therapeutic activity, 97112- Neuromuscular re-education, 97535- Self Care, 02859- Manual therapy, G0283- Electrical stimulation (unattended), 97016- Vasopneumatic device, [id number]- Ultrasound, 79439 (1-2 muscles), 20561 (3+ muscles)- Dry Needling, Patient/Family education, Balance training, Taping, Joint mobilization, Cryotherapy, and Moist heat  PLAN FOR NEXT SESSION:  check strength; might try some SLS activities on with washcloth arch support to position her in inversion, strengthening in SLS on block or foam with medial foot hanging off so she has to work to prevent pronation; see if she can bring in her work shoes    Andrell Tallman, PT 09/30/2024, 8:42 AM

## 2024-10-03 ENCOUNTER — Encounter: Payer: Self-pay | Admitting: Podiatry

## 2024-10-03 ENCOUNTER — Ambulatory Visit: Admitting: Podiatry

## 2024-10-03 ENCOUNTER — Ambulatory Visit: Admitting: Psychiatry

## 2024-10-03 ENCOUNTER — Other Ambulatory Visit (HOSPITAL_BASED_OUTPATIENT_CLINIC_OR_DEPARTMENT_OTHER): Payer: Self-pay

## 2024-10-03 DIAGNOSIS — M79672 Pain in left foot: Secondary | ICD-10-CM

## 2024-10-03 DIAGNOSIS — M79671 Pain in right foot: Secondary | ICD-10-CM | POA: Diagnosis not present

## 2024-10-03 DIAGNOSIS — M722 Plantar fascial fibromatosis: Secondary | ICD-10-CM

## 2024-10-03 MED ORDER — MELOXICAM 15 MG PO TABS
15.0000 mg | ORAL_TABLET | Freq: Every day | ORAL | 1 refills | Status: AC
  Filled 2024-10-03: qty 30, 30d supply, fill #0

## 2024-10-03 NOTE — Progress Notes (Signed)
 Chief Complaint  Patient presents with   Plantar Fasciitis     Bilateral heel pain. 7 pain. Non diabetic. Pt. Has custom orthotics and 6 injections.40% better on right, 10% better on left. 6 pain right foot worst. Also doing P/T.     Discussed the use of AI scribe software for clinical note transcription with the patient, who gave verbal consent to proceed.  History of Present Illness Brianna Barr is a 28 year old female with bilateral plantar fasciitis who presents with persistent pain and limited improvement despite treatment.  She has been experiencing bilateral plantar fasciitis since 2020, with the left foot showing 40-50% improvement, while the right foot remains unchanged. The right foot is significantly worse than the left, with persistent tenderness despite various treatments.  She has been utilizing KT tape at home, which provides temporary relief, and has been undergoing physical therapy. Despite these efforts, the right foot continues to be problematic. The patient reports having a limb difference, with the right leg being shorter.  Her treatment history includes several cortisone injections, anti-inflammatory medications, and the use of orthotic inserts. She has been compliant with stretching exercises and is currently engaged in physical therapy. She is taking meloxicam  for pain and inflammation and requires a refill.  She is frustrated with the lack of long-term relief from conservative measures and is considering surgical intervention, particularly for the right foot, due to the ongoing pain and impact on her daily activities.    Past Medical History:  Diagnosis Date   Anxiety    Asthma    Depression    Overactive bladder    Stress incontinence    Past Surgical History:  Procedure Laterality Date   WISDOM TOOTH EXTRACTION     Allergies  Allergen Reactions   Covid-19 (Mrna Bivalent) Vaccine Proofreader) [Covid-19 (Mrna) Vaccine] Other (See Comments)    Arm  pain, stiffness in arm and neck, redness and swelling of arm, severe itching   Physical Exam There are palpable pedal pulses bilateral.  No appreciable edema or ecchymosis is noted.  No open lesions are noted.  There is pain on palpation to the medial and central plantar fascial bands bilateral as well as the plantar medial and plantar central portions of the heel bilateral.  No Achilles pain on palpation is noted.  No gaps or nodules are noted within the plantar fascia or the Achilles tendon.  Negative Tinel's sign with percussion of the posterior tibial nerve bilateral.  Antalgic gait with for steps out of exam chair.   Assessment/Plan of Care: 1. Plantar fasciitis   2. Pain in both feet   3. Intractable left heel pain   4. Intractable right heel pain      Meds ordered this encounter  Medications   meloxicam  (MOBIC ) 15 MG tablet    Sig: Take 1 tablet (15 mg total) by mouth daily.    Dispense:  30 tablet    Refill:  1   MR FOOT LEFT WO CONTRAST MR FOOT RIGHT WO CONTRAST  Assessment & Plan Bilateral plantar fasciitis Chronic bilateral plantar fasciitis with some improvement in the left foot (40-50%) and minimal to no improvement in the right foot. The right foot is more symptomatic, likely due to limb length discrepancy.  Previous conservative treatments including anti-inflammatories, multiple cortisone injections, custom inserts, stretching exercises, and physical therapy have been attempted with limited success. Surgical intervention is being considered due to persistent symptoms and limb length discrepancy. - Ordered MRI of bilateral feet to  confirm diagnosis and rule out other causes of pain before proceeding with surgery. - Scheduled plantar fascial release surgery for the right foot, with the option to include the left foot if symptoms persist. - Continue physical therapy as it provides some relief. - Prescribed pain and inflammation medication with refill. - Plan for outpatient  surgery under [facility] at [facility]. - Post-operative care includes wearing a surgical shoe with bulky dressing for two to 3 weeks, followed by a transition to regular shoes by three to 4 weeks. - No post-operative physical therapy required, but recommend supportive shoes with arch supports.  Will reach out to patient to reschedule after receiving the MRI results    Clorine Swing DSABRA [place], DPM, FACFAS [facility]     [address] [place], [place] [address]                Office [phone]  Fax [phone]

## 2024-10-04 ENCOUNTER — Ambulatory Visit

## 2024-10-05 ENCOUNTER — Encounter: Payer: Self-pay | Admitting: Podiatry

## 2024-10-06 ENCOUNTER — Other Ambulatory Visit (HOSPITAL_BASED_OUTPATIENT_CLINIC_OR_DEPARTMENT_OTHER): Payer: Self-pay

## 2024-10-06 ENCOUNTER — Encounter: Payer: Self-pay | Admitting: Podiatry

## 2024-10-06 MED ORDER — LORAZEPAM 1 MG PO TABS
1.0000 mg | ORAL_TABLET | ORAL | 1 refills | Status: AC
  Filled 2024-10-06: qty 1, 1d supply, fill #0

## 2024-10-07 ENCOUNTER — Ambulatory Visit

## 2024-10-07 ENCOUNTER — Ambulatory Visit: Admitting: Psychiatry

## 2024-10-07 DIAGNOSIS — F411 Generalized anxiety disorder: Secondary | ICD-10-CM

## 2024-10-07 DIAGNOSIS — J309 Allergic rhinitis, unspecified: Secondary | ICD-10-CM
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
## 2024-10-13 ENCOUNTER — Encounter: Payer: Self-pay | Admitting: Rehabilitation

## 2024-10-13 ENCOUNTER — Ambulatory Visit: Admitting: Rehabilitation

## 2024-10-13 DIAGNOSIS — M79671 Pain in right foot: Secondary | ICD-10-CM

## 2024-10-13 DIAGNOSIS — M722 Plantar fascial fibromatosis: Secondary | ICD-10-CM

## 2024-10-13 DIAGNOSIS — M6289 Other specified disorders of muscle: Secondary | ICD-10-CM

## 2024-10-13 DIAGNOSIS — R262 Difficulty in walking, not elsewhere classified: Secondary | ICD-10-CM

## 2024-10-13 DIAGNOSIS — M6281 Muscle weakness (generalized): Secondary | ICD-10-CM

## 2024-10-13 NOTE — Therapy (Signed)
 OUTPATIENT PHYSICAL THERAPY LOWER EXTREMITY TREATMENT   Patient Name: Brianna Barr MRN: [id number] DOB: [birth year], 28 y.o., female Today's Date: 10/13/2024   END OF SESSION:  PT End of Session - 10/13/24 1158     Visit Number 9    Date for Recertification  11/01/24    PT Start Time 1150    PT Stop Time 1240    PT Time Calculation (min) 50 min    Activity Tolerance Patient tolerated treatment well;No increased pain    Behavior During Therapy WFL for tasks assessed/performed                Past Medical History:  Diagnosis Date   Anxiety    Asthma    Depression    Overactive bladder    Stress incontinence    Past Surgical History:  Procedure Laterality Date   WISDOM TOOTH EXTRACTION     Patient Active Problem List   Diagnosis Date Noted   Seasonal and perennial allergic rhinitis 02/18/2024   Allergic rhinitis due to allergen 08/20/2023   Allergic conjunctivitis of both eyes 08/20/2023   Anxiety and depression 07/07/2019   Mild intermittent asthma, uncomplicated 07/07/2019   Seasonal allergies 07/07/2019   Plantar fasciitis 07/07/2019   Hyperhidrosis 07/07/2019   OAB (overactive bladder) 07/07/2019   Allergy  08/06/2018    PCP: Mercer Clotilda SAUNDERS, MD   REFERRING PROVIDER: Loel Awanda BIRCH, DPM   REFERRING DIAG: M72.2 (ICD-10-CM) - Plantar fasciitis M79.671,M79.672 (ICD-10-CM) - Pain in both feet M62.89 (ICD-10-CM) - Tightness of both gastrocnemius muscles  THERAPY DIAG:  Plantar fasciitis  Pain in both feet  Tightness of both gastrocnemius muscles  Difficulty in walking, not elsewhere classified  Muscle weakness (generalized)  RATIONALE FOR EVALUATION AND TREATMENT: Rehabilitation  ONSET DATE: several years ago  NEXT MD VISIT:    SUBJECTIVE:                                                                                                                                                                                                          SUBJECTIVE STATEMENT:  Patient reports saw podiatry since our last visit and they discussed a surgery to release the plantar fascia as an option for her symptoms.   Patient would like to wait on this to see if she can continue to improve.   With PT She is getting improvement, but it is slow.   States feels more success with her L foot than her R   28 y/o patient referred to PT from Dr Loel (Podiatry) for B plantar  fasciitis.   Patient reports long h/o plantar fasciitis in both feet since 2020.    She received cortisone injections and custom orthotics BLE in March of this year.  She returned to podiatry in 10/25 and reported feeling about 60% better and received second round of cortisone injections.   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   Shoe wear is a thick sole tennis shoe.  She reports having tried many different types and brands of shoes and spent a lot money to still have the pain that she has.  States the pain is worst after a 12 hour work shift.  She reports the pain is mainly over the medial plantar heel, but can go into the arch as pain worsens. R foot is worse than the L.   She also Gets B calf fatigue, soreness, and cramping the longer she is on her feet.    PAIN: Are you having pain? Yes: NPRS scale: 4/10 now;  worst over last week 7/10 Pain location: B medial heel/arches Pain description: constant pain now; constant dull ache all day progressing to sharp and tingling in toes sometimes Aggravating factors: walking on TM for exercise, but can't due to pain Relieving factors: NWB positions  PERTINENT HISTORY:  Anxiety, depression, asthma  PRECAUTIONS: None  RED FLAGS: None  WEIGHT BEARING RESTRICTIONS: No  FALLS:  Has patient fallen in last 6 months? 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 Usual hobbies, recreational or sporting activities 3 2  3. Getting into/out of the bath 4 3  4. Walking between rooms 2 2  5. Putting on socks/shoes 2 2  6. Squatting  3 3  7. Lifting an object, like a bag of groceries from the floor 3 3  8. Performing light activities around your home 3 3  9. Performing heavy activities around your home 2 1  10. Getting into/out of a car 4 2  11. Walking 2 blocks 3 2  12. Walking 1 mile 1 1  13. Going up/down 10 stairs (1 flight) 1 2  14. Standing for 1 hour 1 1  15.  sitting for 1 hour 3 4  16. Running on even ground 1 1  17. Running on uneven ground 1 1  18. Making sharp turns while running fast 1 2  19. Hopping  1 1  20. Rolling over in bed 4 4  Score total:  44 42     COGNITION: Overall cognitive status: Within functional limits for tasks assessed    SENSATION: WFL  EDEMA:  none  POSTURE:  Supine:  L malleolus is longer;   long sit test = no change.   Standing:  R foot supinates, L foot pronates  PALPATION: TTP over the medial plantar heel at p. Fascia insertion BLE.  Also TTP in both arches  R >L  MUSCLE LENGTH: Heelcord: tight BLE  LOWER EXTREMITY ROM:  Active ROM Right eval Left eval R 09/19/24 L 09/19/24  Hip flexion      Hip extension      Hip abduction      Hip adduction      Hip internal rotation      Hip external rotation      Knee flexion      Knee extension      Ankle dorsiflexion 5 5 7  -5  Ankle plantarflexion 40+ 40+ 62 60+   Ankle inversion 40 40 30 39  Ankle eversion 20 26 26 26     LOWER EXTREMITY MMT:  p! = pain  MMT Right eval Left eval R 09/26/24 L 09/26/24  Hip flexion      Hip extension      Hip abduction      Hip adduction      Hip internal rotation      Hip external rotation      Knee flexion      Knee extension      Ankle dorsiflexion 4 4+ 4+ 5  Ankle plantarflexion 5; p! 5    Ankle inversion 4+ 4+ 4+ 4+  Ankle eversion 4 4 5  4+   (Blank rows = not tested)  LOWER EXTREMITY SPECIAL TESTS:  Ankle special tests: Great toe extension test: negative  Prone in subtalar neutral:  R rearfoot varus of at least 5-8 degrees with forefoot varus of 1-2 degrees       L rearfoot is 0 deg or even a little valgus appearing; forefoot varus of 2-3 deg First ray on R is a little plantarflexed, but mobile Great toe extension is 70 deg on R  FUNCTIONAL TESTS:  N/a  GAIT: Distance walked: into clinic 200' Assistive device utilized: None Level of assistance: Complete Independence Gait pattern: supinates on R, pronates on the L; toes out more on the L Comments:    TODAY'S TREATMENT:  THERAPEUTIC EXERCISE: To improve strength and endurance.  Demonstration, verbal and tactile cues throughout for technique. Bike L4 x 6'  THERAPEUTIC ACTIVITIES: To improve functional performance.  Demonstration, verbal and tactile cues throughout for technique. Leg press 25# x 30 BLE Toe press 25# x 30 BLE  NEUROMUSCULAR RE-EDUCATION: To improve balance, coordination, kinesthesia, posture, and proprioception. SLS on long foam with medial foot hanging off so patient has to hold eversion x 1' x 2 each foot Deep back squats w/ washcloths folded under B foot arches for supination Monster walks blue TB x 3 laps at counter  MODALITIES:   100% US  @ 1 Mhz x 1.4 w/cm2 x 8' to R plantar fascia for pain relief and heating tissue prior to [facility] for increased effectiveness of MFR  09/29/24 THERAPEUTIC EXERCISE: To improve strength  and endurance.  Demonstration, verbal and tactile cues throughout for technique. Bike L4 x 6'  THERAPEUTIC ACTIVITIES: To improve functional performance.  Demonstration, verbal and tactile cues throughout for technique. Marble pickup and drop in container B feet x 5-6' Great toe extension stretching w/ towel x 1' x 2 BLE Seated PF towel stretch x 1' x 2 BLE Applied a 1/8 rubber strip w/ leukotape to the medial aspect of patient's removable inserts on her R shoe (old shoes)   MANUAL THERAPY: To promote improved flexibility and reduced pain utilizing myofascial release. Deep pressure MFR, stripping to the P. Fascia from heel to metatarsal heads KT tape w/ 1 long I strip from mid  plantar foot to mid gastroc w/ 25% stretch placed in ankle PF BLE; 2 short stirrup I strips at 50% tension around heel and arch.    MODALITIES:   100% US  @ 1 Mhz x 1.4 w/cm2 x 8' to R plantar fascia for pain relief and heating tissue prior to [facility] for increased effectiveness of MFR  09/26/24 THERAPEUTIC EXERCISE: To improve strength and endurance.  Demonstration, verbal and tactile cues throughout for technique. Bike L3 x 6' Tested strength B ankles Plantar fascia stretch 2x51min BLE with towel Prostretch for gastroc x 1' BLE    MANUAL THERAPY: To promote reduced pain utilizing myofascial release. KT tape to B plantar fascia one strip from P fascia to achilles, 2 horizontal strips going medial to lateral on plantar surface of foot  09/21/24 THERAPEUTIC EXERCISE: To improve strength and endurance.  Demonstration, verbal and tactile cues throughout for technique. Bike L4 x 6'  THERAPEUTIC ACTIVITIES: To improve functional performance.  Demonstration, verbal and tactile cues throughout for technique. Standing gastroc stretch x 1' x 3 BLE Standing soleus stretch x 1' x 3 BLE Prostretch x 1' x 3 BLE  SELF CARE: Provided education on pain management options. Reviewed how to tape her plantar fascia with proper  technique and foot positioning and no tension on the tape. Reviewed fan cut technique and I strip technique with placement  MANUAL THERAPY: To promote reduced pain utilizing myofascial release. Deep pressure MFR to p. Fascia BLE KT tape to B p. Fascia with fan cut I strip along the fascia longitudinally, and short I strip from the arch around to the dorsum of the foot  no tension  09/19/24 Bike L4x34min Measured ankle ROM Gastroc stretch on foam roll 2x38min; soleus stretch 2x53min BLE Seated B ankle EV,IV,DF x 10 RTB  Seated plantar fascia stretch on ground x 30' B Standing plantar fascia stretch on ground x 30' B Seated hip ABD blue TB x 20  09/16/24 Bike L2x27min BAPS level 3 BLE: Ankle PF/DF 2x10 EV/IV 2x10 CW/CCW circles 2x10 Seated ankle PF with GTB x 20 BLE IASTM with edge tool to B planatr fascia, medial calcaneus, and to medial arch Toe curl+ PF GTB x 20      09/12/24 Recumbent Bike L4x63min STM to B planatr fascia, medial calcaneus, and to medial arch Longsitting plantar fascia stretch 2 x 1 min B Seated toe curls B x 20 Seated ankle EV YTB x 20 B; DF YTB x 20 B   09/09/24 Seated B heel/toe raises x 20 Seated ankle EV/IV x 10 R/L Seated toe curls x 20 R/L R/L gastroc stretch long sitting x 1 min Standing gastroc stretch on 1/2 foam roll x 1 min BLE Standing soleus stretch on 1/2 foam roll x 1 min BLE STM to B planatr fascia, medial calcaneus, and to medial arch Longsitting plantar fascia stretch x 1 min  09/06/24 SELF CARE: Provided education on PT POC progression;  initial HEP   PATIENT EDUCATION:  Education details: adding marble pick up to HEP  Person educated: Patient Education method: Explanation, Demonstration, Verbal cues, Tactile cues, Handouts, and MedBridgeGO app access provided Education comprehension: verbalized understanding, verbal cues required, tactile cues required, and needs further education  HOME EXERCISE PROGRAM: Access Code:  [email or web address] URL: [email or web address] Date: 10/13/2024 Prepared by: Garnette Montclair  Exercises - Standing Soleus Stretch on Step  - 1 x daily - 7 x weekly - 1 sets - 2 reps - 1 min hold - Standing Gastroc Stretch on Foam  1/2 Roll  - 1 x daily - 7 x weekly - 1 sets - 2 reps - 1 min hold - Standing Plantar Fascia Mobilization with Small Ball  - 1 x daily - 7 x weekly - 1 sets - 1 reps - Long Sitting Plantar Fascia Stretch with Towel  - 1 x daily - 7 x weekly - 2 sets - 2 reps - 1 min hold - CLX Ankle Dorsiflexion and Eversion  - 1 x daily - 7 x weekly - 2 sets - 10 reps - Seated Ankle Inversion with Resistance  - 1 x daily - 7 x weekly - 2 sets - 10 reps - Seated Marble Pick-Up with Toes  - 1 x daily - 7 x weekly - 3 sets - 10 reps - Eccentric Plantar Fascia Strengthening on Step  - 1 x daily - 7 x weekly - 3 sets - 8 reps - Single Leg Stance on Foam Pad  - 1 x daily - 7 x weekly - 1 sets - 1 reps - 1 min hold - Forward Monster Walks  - 1 x daily - 7 x weekly - 3 sets - 10 reps  ASSESSMENT:  CLINICAL IMPRESSION:   Patient feeling some improvements in pain mainly in the L foot.   R foot is harder for her to tell after long 12 hour work shift.   She feels better on her days off, and then has the pain again after returning to work.   Added in some hip and knee strengthening activities today with corrective foot alignment using washcloths under the arches.   Will continue to work on her overall LE strength as well her ankle and foot strength.    PT remains necessary for ROM, strength, gait, pain deficits.   Continue per POC  Brianna Barr is a 28 y.o. female who was referred to physical therapy for evaluation and treatment for BLE plantar fasciitis.    Patient reports onset of B heel/arch pain beginning several years ago.  The problem got new custom orthotics this spring and cortisone shots in the spring and again last month which have helped.   Pain is worse after working 12 hour  shifts on the floor at [employer].  Patient does have considerable calf tightness BLE.   She also has a longer LLE and is advised to try a shoe lift for the shorter RLE to see if it would help.   Patient has deficits in B LE flexibility, BLE strength, abnormal posture, and TTP with pain at the plantar fascia insertion of both heels which are interfering with ADLs and are impacting quality of life.  On LEFS patient scored 44/80 demonstrating 45% functional limitation.  Abigal will benefit from skilled PT to address above deficits to improve mobility and activity tolerance with decreased pain interference.  OBJECTIVE IMPAIRMENTS: difficulty walking, decreased ROM, decreased strength, impaired flexibility, and pain.   ACTIVITY LIMITATIONS: standing, stairs, and locomotion level  PARTICIPATION LIMITATIONS: shopping, community activity, and occupation  PERSONAL FACTORS: Fitness, Time since onset of injury/illness/exacerbation, and 1-2 comorbidities: Anxiety, depression, asthma are also affecting patient's functional outcome.   REHAB POTENTIAL: Good  CLINICAL DECISION MAKING: Evolving/moderate complexity  EVALUATION COMPLEXITY: Moderate   GOALS: Goals reviewed with patient? Yes  SHORT TERM GOALS: Target date: 10/04/2024   Patient will be independent with initial HEP. Baseline: 100% PT assist required for correct completion Goal status: MET- 09/16/24  2.  Patient will report at least 25% improvement in B  heel pain to improve QOL. Baseline: 8/10 worst Goal status: MET- 09/19/24     LONG TERM GOALS: Target date: 11/01/2024   Patient will be independent with advanced/ongoing HEP to improve outcomes and carryover.  Baseline: no advanced HEP yet Goal status: INITIAL  2.  Patient will report at least 50-75% improvement in B heel pain to improve QOL. Baseline: 8/10 worst Goal status: IN PROGRESS- 09/19/24 40% improvement  3.  Patient will demonstrate improved B ankle ROM to WNL to allow  for normal gait and stair mechanics. Baseline: Refer to above LE ROM table Goal status: IN PROGRESS- 09/19/24 see table  4.  Patient will demonstrate improved B ankle strength to >/= 5/5 for improved stability and ease of mobility. Baseline: Refer to above LE MMT table Goal status: INITIAL  5.  Patient will be able to ambulate 5-10 hrs at work with normal gait pattern without increased pain to access community.  Baseline: pain throughout the shift, but she pushes through it Goal status: INITIAL  6. Patient will be able to ascend/descend stairs with 1 HR and reciprocal step pattern safely to access home and community.  Baseline: painful but does it Goal status: INITIAL  7.  Patient will report >/= 60/80 on LEFS (MCID = 9 pts) to demonstrate improved functional ability. Baseline: 44/80 09/30/24: 42/80 Goal status: IN PROGRESS  8.  Patient will demonstrate at least 19/24 on DGI to decrease risk of falls. Baseline: TBD Goal status: INITIAL   PLAN:  PT FREQUENCY: 1-2x/week  PT DURATION: 8 weeks  PLANNED INTERVENTIONS: {02835- PT Re-evaluation, 97750- Physical Performance Testing, 97110-Therapeutic exercises, 97530- Therapeutic activity, [id number]- Neuromuscular re-education, 97535- Self Care, 02859- Manual therapy, G0283- Electrical stimulation (unattended), 97016- Vasopneumatic device, [id number]- Ultrasound, 79439 (1-2 muscles), 20561 (3+ muscles)- Dry Needling, Patient/Family education, Balance training, Taping, Joint mobilization, Cryotherapy, and Moist heat  PLAN FOR NEXT SESSION:  check strength; do more general LE strengthening with arch supports with washcloths PRN for positioning;  add running man, RDLs, step ups w/ hip abd/ext, etc  Davison Ohms, PT 10/13/2024, 4:05 PM

## 2024-10-14 ENCOUNTER — Ambulatory Visit (INDEPENDENT_AMBULATORY_CARE_PROVIDER_SITE_OTHER)

## 2024-10-14 DIAGNOSIS — J302 Other seasonal allergic rhinitis: Secondary | ICD-10-CM

## 2024-10-14 DIAGNOSIS — J3089 Other allergic rhinitis: Secondary | ICD-10-CM | POA: Diagnosis not present

## 2024-10-17 ENCOUNTER — Ambulatory Visit: Admitting: Psychiatry

## 2024-10-17 DIAGNOSIS — F411 Generalized anxiety disorder: Secondary | ICD-10-CM | POA: Diagnosis not present

## 2024-10-17 NOTE — Progress Notes (Signed)
 "       [facility] Counselor/Therapist Progress Note  Patient ID: Brianna Barr, MRN: [id number],    Date: 10/17/2024  Time Spent: 55 minutes   Treatment Type: Individual Therapy  Reported Symptoms:  anxiety, depression, having painful issues with foot and may have surgery soon     Mental Status Exam:  Appearance:   Casual     Behavior:  Appropriate, Sharing, and Motivated  Motor:  Normal  Speech/Language:   Clear and Coherent  Affect:  Depressed and anxiety  Mood:  anxious and depressed  Thought process:  goal directed  Thought content:    Rumination  Sensory/Perceptual disturbances:    WNL  Orientation:  oriented to person, place, time/date, situation, day of week, month of year, year, and stated date of Dec. 22, 2025  Attention:  Good  Concentration:  Good  Memory:  WNL  Fund of knowledge:   Good  Insight:    Good  Judgment:   Good  Impulse Control:  Good   Risk Assessment: Danger to Self:  No Self-injurious Behavior: No Danger to Others: No Duty to Warn:no Physical Aggression / Violence:No  Access to Firearms a concern: No  Gang Involvement:No   Subjective:  Patient today focusing on her anxiety mostly related to (possible) upcoming surgery re: her plantar fascitis. Some reservations now about her possible surgery and talking this through today in session. Some people helpful in suggestions/questions re: surgery and some are not. Needing session today to further process the health needs of her parents and patient's relationship and care for them as it is just hard to manage. Some frustrations in talking with friends about her recommended coming up possible surgery. Patient uncertain as to what is best for her, but also tending to believe what her doctor has said recently. Sharing and processing stressors re: parent's health diagnosis and treatment. Stressful for her and 2 siblings trying to work around their personal schedules to help out within family. Working  today to talk further about her need to take care of herself in addition to speaking more openly in the family and letting them know of her position and taking care of getting her own needs met as she is likely to have surgery soon and her upcoming appointment with her doctor is about that surgery.  Continued work needed on it being okay for her to say no when she needs to and that that is no reflection on her care for family and other loved ones.  Continue to work on her relationships within the family, her own health issues and concerns, anxiety, depression, work stress, health worries for her parents, and overthinking.  Also struggling with so many bad things happening in the world and conflict within the world.  Her church continues to be a good solid place of not only worship but good interactions with other people and she has formed several meetings full relationships where she feels accepted and not judged.  Excited to be a part of the beginning of this church.  Encouraged her to continue her work on better work/life balance, positive self-care strategies, and following up on recommendations by her doctor particular regarding her feet situation that has been such a struggle.  Interventions: Cognitive Behavioral Therapy, Solution-Oriented/Positive Psychology, and Ego-Supportive Long term goal: Reduce overall level, frequency, and intensity of the anxiety so that daily functioning is not impaired. Short term goal: Increase understanding of beliefs and messages that produce anxiety, depression, or worries. Strategy: Identify, challenge, and  replace anxious/depressive/fearful self-talk with positive, realistic, and empowering self-talk   Diagnosis:   ICD-10-CM   1. Generalized anxiety disorder  F41.1      Plan: Patient in session today working further on her anxiety, depression, personal concerns about upcoming surgery, and family interactions that have felt problematic and in some cases  hurtful.  Patient was calmer today but also tearful at times when talking especially about upcoming surgery and also how adult siblings do not seem to understand patient's limitations with her own health/feet problems right now that are painful and that she is hopefully awaiting surgery in the new year.  Not feeling very heard within family and feeling that she always has to try to change her plans or needs in order to help their parents versus having more help within the family as a whole.  Working on better limit setting and speaking up for herself when needed as well as work/family/personal boundaries and commitments.  She needs to try and set limits for herself that are healthier. Therapist does continue to encourage patient in her practice of more positive and self affirming behaviors as noted in sessions including: Being more open to making new friends and strengthening existing friendships especially within her church and small friends group, interrupting negative thought patterns and replacing with more positive thoughts that do not feed anxiety and or depression, staying the present versus the past or future, use of positive self-talk, continue working with goal-directed behaviors that keep her moving in a direction that supports gradual improvement of her overall emotional health, continue her work on better management of challenging circumstances within her family and friendships, make healthier choices including not taking on extra work shifts since this has proven to add to her stress and anxiety.  Brianna Barr continues to show good effort and work on her treatment goals and has made some progress.  She definitely remains committed to keep working on specific goal-directed behaviors that help support her moving forward in a more hopeful and healthier direction into the future.  Goal review and progress/challenges noted with patient.  Next appointment within 2 weeks.   Barnie Bunde,  LCSW                   "

## 2024-10-18 ENCOUNTER — Ambulatory Visit

## 2024-10-18 DIAGNOSIS — J309 Allergic rhinitis, unspecified: Secondary | ICD-10-CM

## 2024-10-22 ENCOUNTER — Ambulatory Visit
Admission: RE | Admit: 2024-10-22 | Discharge: 2024-10-22 | Disposition: A | Source: Ambulatory Visit | Attending: Podiatry

## 2024-10-22 DIAGNOSIS — M79672 Pain in left foot: Secondary | ICD-10-CM

## 2024-10-22 DIAGNOSIS — M79671 Pain in right foot: Secondary | ICD-10-CM

## 2024-10-22 DIAGNOSIS — M722 Plantar fascial fibromatosis: Secondary | ICD-10-CM

## 2024-10-24 ENCOUNTER — Ambulatory Visit

## 2024-10-24 DIAGNOSIS — J302 Other seasonal allergic rhinitis: Secondary | ICD-10-CM

## 2024-10-24 DIAGNOSIS — J3089 Other allergic rhinitis: Secondary | ICD-10-CM | POA: Diagnosis not present

## 2024-10-28 ENCOUNTER — Other Ambulatory Visit (HOSPITAL_BASED_OUTPATIENT_CLINIC_OR_DEPARTMENT_OTHER): Payer: Self-pay

## 2024-10-28 ENCOUNTER — Other Ambulatory Visit: Payer: Self-pay

## 2024-10-28 ENCOUNTER — Ambulatory Visit
Admission: RE | Admit: 2024-10-28 | Discharge: 2024-10-28 | Disposition: A | Discharge status: Home / Self Care | Source: Ambulatory Visit | Attending: Family Medicine | Admitting: Family Medicine

## 2024-10-28 VITALS — BP 110/83 | HR 107 | Temp 98.9°F | Resp 20 | Ht 62.0 in | Wt 235.0 lb

## 2024-10-28 DIAGNOSIS — J0100 Acute maxillary sinusitis, unspecified: Secondary | ICD-10-CM

## 2024-10-28 DIAGNOSIS — J209 Acute bronchitis, unspecified: Secondary | ICD-10-CM | POA: Diagnosis not present

## 2024-10-28 MED ORDER — PREDNISONE 20 MG PO TABS
ORAL_TABLET | ORAL | 0 refills | Status: AC
  Filled 2024-10-28: qty 11, 7d supply, fill #0

## 2024-10-28 MED ORDER — AZITHROMYCIN 250 MG PO TABS
ORAL_TABLET | ORAL | 0 refills | Status: AC
  Filled 2024-10-28: qty 6, 5d supply, fill #0

## 2024-10-28 NOTE — ED Triage Notes (Addendum)
 Pt presenting with c/o sinus pressure, non productive cough, SOB, ear pressure x  7 days. Pt stated that she did several at home Covid and Flu tests  which were all negative.  Pt stated that she has a h/o Asthma which she thinks is flaring up. Pt stated she has been using OTC cough medication with minimal effectiveness.

## 2024-10-28 NOTE — ED Provider Notes (Signed)
 " [facility]    CSN: [id number] Arrival date & time: 10/28/24  0906      History   Chief Complaint Chief Complaint  Patient presents with   URI    Entered by patient    HPI Samariya Dionne Khim is a 29 y.o. female.   Patient developed URI symptoms one week ago with rhinorrhea and a dry cough, now semi-productive. Two days ago she became worse with sinus and ear pressure.   She has asthma, admitting that she has had increased shortness of breath requiring more frequent use of her albuterol  inhaler.  She has had sweats but does not know if she has had fever.  She denies pleuritic pain.  She had several negative home covid and flu tests.  The history is provided by the patient.    Past Medical History:  Diagnosis Date   Anxiety    Asthma    Depression    Overactive bladder    Stress incontinence     Patient Active Problem List   Diagnosis Date Noted   Seasonal and perennial allergic rhinitis 02/18/2024   Allergic rhinitis due to allergen 08/20/2023   Allergic conjunctivitis of both eyes 08/20/2023   Anxiety and depression 07/07/2019   Mild intermittent asthma, uncomplicated 07/07/2019   Seasonal allergies 07/07/2019   Plantar fasciitis 07/07/2019   Hyperhidrosis 07/07/2019   OAB (overactive bladder) 07/07/2019   Allergy  08/06/2018    Past Surgical History:  Procedure Laterality Date   WISDOM TOOTH EXTRACTION      OB History     Gravida  0   Para  0   Term  0   Preterm  0   AB  0   Living  0      SAB  0   IAB  0   Ectopic  0   Multiple  0   Live Births  0            Home Medications    Prior to Admission medications  Medication Sig Start Date End Date Taking? Authorizing Provider  azithromycin  (ZITHROMAX  Z-PAK) 250 MG tablet Take 2 tabs today; then begin one tab once daily for 4 more days. 10/28/24  Yes Pauline Garnette LABOR, MD  predniSONE  (DELTASONE ) 20 MG tablet Take one tab by mouth twice daily for 4 days, then one daily for  3 days. Take with food. 10/28/24  Yes Pauline Garnette LABOR, MD  albuterol  (VENTOLIN  HFA) 108 (90 Base) MCG/ACT inhaler Inhale 2 puffs by mouth into the lungs every 6 (six) hours as needed for wheezing or shortness of breath. 09/07/23   Teddy Sharper, FNP  budesonide -formoterol  (SYMBICORT ) 160-4.5 MCG/ACT inhaler At onset of respiratory illness/asthma flare: Inhale 2 puffs twice daily with spacer for 2 weeks or until symptoms resolve. 08/19/24   Marinda Rocky SAILOR, MD  buPROPion  (WELLBUTRIN  XL) 150 MG 24 hr tablet Take 3 tablets (450 mg total) by mouth every morning. 07/02/23   Mozingo, Regina Nattalie, NP  EPINEPHrine  0.3 mg/0.3 mL IJ SOAJ injection Inject 0.3 mg into the muscle as needed for anaphylaxis. Bring to your allergy  injection appointments. 02/18/24   Marinda Rocky SAILOR, MD  escitalopram  (LEXAPRO ) 20 MG tablet Take 1 tablet (20 mg total) by mouth daily. 07/02/23   Mozingo, Regina Nattalie, NP  hydrOXYzine  (ATARAX ) 25 MG tablet Take 1 tablet (25 mg total) by mouth 3 (three) times daily as needed. 07/02/23   Mozingo, Regina Nattalie, NP  levocetirizine (XYZAL ) 5 MG tablet Take 1  tablet (5 mg total) by mouth every evening. 08/20/23   Marinda Rocky SAILOR, MD  linaclotide  (LINZESS ) 145 MCG CAPS capsule Take 1 capsule (145 mcg total) by mouth daily before breakfast. 04/04/21   Abran Norleen SAILOR, MD  meloxicam  (MOBIC ) 15 MG tablet Take 1 tablet (15 mg total) by mouth daily. 10/03/24   McCaughan, Dia D, DPM  Olopatadine  HCl 0.2 % SOLN Place 1 drop into both eyes daily as needed. 08/20/23   Marinda Rocky SAILOR, MD  Olopatadine -Mometasone  (RYALTRIS ) [phone] MCG/ACT SUSP Place 2 sprays into the nose 2 (two) times daily. 08/20/23   Marinda Rocky SAILOR, MD  propranolol  (INDERAL ) 10 MG tablet Take 1 tablet (10 mg total) by mouth 3 (three) times daily. 07/02/23   Mozingo, Regina Nattalie, NP  Vitamin D , Ergocalciferol , (DRISDOL ) 1.25 MG (50000 UNIT) CAPS capsule Take 1 capsule (50,000 Units total) by mouth every 7 (seven) days. 07/07/24   Mercer Clotilda SAUNDERS, MD    Family History Family History  Problem Relation Age of Onset   Multiple sclerosis Mother    Hypertension Father    Hypertension Maternal Grandmother    Diabetes Maternal Grandmother    Heart disease Maternal Grandmother    Cervical cancer Maternal Grandmother    Cirrhosis Maternal Grandfather    Stroke Paternal Grandmother    Heart disease Paternal Grandmother    Hypertension Paternal Grandmother    Diabetes Paternal Grandmother    Endometrial cancer Paternal Grandmother    Stroke Paternal Grandfather    Colon cancer Neg Hx    Colon polyps Neg Hx    Pancreatic cancer Neg Hx    Esophageal cancer Neg Hx    Liver disease Neg Hx    Stomach cancer Neg Hx     Social History Social History[1]   Allergies   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 wheezing + nasal congestion + post-nasal drainage + sinus pain/pressure No itchy/red eyes ? earache No hemoptysis + SOB ? Fever, + sweats No nausea No vomiting No abdominal pain No diarrhea No urinary symptoms No skin rash + fatigue No myalgias + headache Used OTC meds (Nyquil, Robitussin) without relief   Physical Exam Triage Vital Signs ED Triage Vitals  Encounter Vitals Group     BP 10/28/24 0940 110/83     Girls Systolic BP Percentile --      Girls Diastolic BP Percentile --      Boys Systolic BP Percentile --      Boys Diastolic BP Percentile --      Pulse Rate 10/28/24 0940 (!) 107     Resp 10/28/24 0940 20     Temp 10/28/24 0940 98.9 F (37.2 C)     Temp Source 10/28/24 0940 Oral     SpO2 10/28/24 0940 99 %     Weight 10/28/24 0943 235 lb (106.6 kg)     Height 10/28/24 0943 5' 2 (1.575 m)     Head Circumference --      Peak Flow --      Pain Score 10/28/24 0942 4     Pain Loc --      Pain Education --      Exclude from Growth Chart --    No data found.  Updated Vital  Signs BP 110/83 (BP Location: Right Arm)   Pulse (!) 107   Temp 98.9 F (37.2 C) (Oral)  Resp 20   Ht 5' 2 (1.575 m)   Wt 106.6 kg   SpO2 99%   BMI 42.98 kg/m   Visual Acuity Right Eye Distance:   Left Eye Distance:   Bilateral Distance:    Right Eye Near:   Left Eye Near:    Bilateral Near:     Physical Exam Nursing notes and Vital Signs reviewed. Appearance:  Patient appears stated age, and in no acute distress Eyes:  Pupils are equal, round, and reactive to light and accomodation.  Extraocular movement is intact.  Conjunctivae are not inflamed  Ears:  Canals normal.  Tympanic membranes normal.  Nose:  Congested turbinates.  Maxillary sinus tenderness is present.  Pharynx:  Normal Neck:  Supple.  Mildly enlarged lateral nodes are present, tender to palpation on the left.   Lungs:  Clear to auscultation.  Breath sounds are equal.  Moving air well. Heart:  Regular rate and rhythm without murmurs, rubs, or gallops.  Abdomen:  Nontender without masses or hepatosplenomegaly.  Bowel sounds are present.  No CVA or flank tenderness.  Extremities:  No edema.  Skin:  No rash present.   UC Treatments / Results  Labs (all labs ordered are listed, but only abnormal results are displayed) Labs Reviewed - No data to display  EKG   Radiology No results found.  Procedures Procedures (including critical care time)  Medications Ordered in UC Medications - No data to display  Initial Impression / Assessment and Plan / UC Course  I have reviewed the triage vital signs and the nursing notes.  Pertinent labs & imaging results that were available during my care of the patient were reviewed by me and considered in my medical decision making (see chart for details).    Begin Z-pack and prednisone  burst/taper. Followup with Family Doctor if not improved in one week.   Final Clinical Impressions(s) / UC Diagnoses   Final diagnoses:  Acute bronchitis, unspecified organism   Acute maxillary sinusitis, recurrence not specified     Discharge Instructions      Take plain guaifenesin (1200mg  extended release tabs such as Mucinex) twice daily, with plenty of water, for cough and congestion.  Get adequate rest.   May use Afrin nasal spray (or generic oxymetazoline) each morning for about 5 days and then discontinue.  Also recommend using saline nasal spray several times daily and saline nasal irrigation (AYR is a common brand).  Use Flonase nasal spray each morning after using Afrin nasal spray and saline nasal irrigation. Try warm salt water gargles for sore throat.  Stop all antihistamines (Nyquil, Xyzal , etc) for now, and other non-prescription cough/cold preparations. May take Delsym Cough Suppressant (12 Hour Cough Relief) at bedtime for nighttime cough.  Continue inhalers as prescribed.  If symptoms become significantly worse during the night or over the weekend, proceed to the local emergency room.     ED Prescriptions     Medication Sig Dispense Auth. Provider   azithromycin  (ZITHROMAX  Z-PAK) 250 MG tablet Take 2 tabs today; then begin one tab once daily for 4 more days. 6 tablet Pauline Garnette LABOR, MD   predniSONE  (DELTASONE ) 20 MG tablet Take one tab by mouth twice daily for 4 days, then one daily for 3 days. Take with food. 11 tablet Pauline Garnette LABOR, MD           [1]  Social History Tobacco Use   Smoking status: Never    Passive exposure: Never   Smokeless tobacco: Never  Vaping Use   Vaping status: Never Used  Substance Use Topics   Alcohol use: No   Drug use: No     Pauline Garnette LABOR, MD 10/30/24 1735  "

## 2024-10-28 NOTE — Discharge Instructions (Signed)
 Take plain guaifenesin (1200mg  extended release tabs such as Mucinex) twice daily, with plenty of water, for cough and congestion.  Get adequate rest.   May use Afrin nasal spray (or generic oxymetazoline) each morning for about 5 days and then discontinue.  Also recommend using saline nasal spray several times daily and saline nasal irrigation (AYR is a common brand).  Use Flonase nasal spray each morning after using Afrin nasal spray and saline nasal irrigation. Try warm salt water gargles for sore throat.  Stop all antihistamines (Nyquil, Xyzal , etc) for now, and other non-prescription cough/cold preparations. May take Delsym Cough Suppressant (12 Hour Cough Relief) at bedtime for nighttime cough.  Continue inhalers as prescribed.  If symptoms become significantly worse during the night or over the weekend, proceed to the local emergency room.

## 2024-10-31 ENCOUNTER — Ambulatory Visit: Admitting: Psychiatry

## 2024-10-31 ENCOUNTER — Encounter: Payer: Self-pay | Admitting: Internal Medicine

## 2024-10-31 NOTE — Telephone Encounter (Signed)
 Can we have her come by office for spacer? Can you have her clarify what she means by rescue inhaler? Albuterol  which comes under many brands such as ventolin  or proair  is a rescue inhaler. It can be used every 4 hours as needed. Alterantively, Symbicort  can be used as a rescue inhaler also every 4 hours with a maximum of 12 puffs/day.  Using Symbicort  in block therapy and as a rescue inhaler is a great option.  Some patients report more relief with a nebulizer, and if she would like for me to send in liquid albuterol  for the nebulizer machine, we can also do that. She can have both formulations and chose one or the other when she is having a flare. She would still use it every 4 hours as needed.   Let me know if she has any questions.

## 2024-11-04 ENCOUNTER — Ambulatory Visit

## 2024-11-04 DIAGNOSIS — J302 Other seasonal allergic rhinitis: Secondary | ICD-10-CM

## 2024-11-06 ENCOUNTER — Ambulatory Visit: Payer: Self-pay | Admitting: Podiatry

## 2024-11-09 ENCOUNTER — Other Ambulatory Visit (HOSPITAL_BASED_OUTPATIENT_CLINIC_OR_DEPARTMENT_OTHER): Payer: Self-pay

## 2024-11-09 ENCOUNTER — Ambulatory Visit (INDEPENDENT_AMBULATORY_CARE_PROVIDER_SITE_OTHER)

## 2024-11-09 ENCOUNTER — Other Ambulatory Visit: Payer: Self-pay

## 2024-11-09 DIAGNOSIS — J302 Other seasonal allergic rhinitis: Secondary | ICD-10-CM | POA: Diagnosis not present

## 2024-11-09 MED ORDER — ALBUTEROL SULFATE (2.5 MG/3ML) 0.083% IN NEBU
2.5000 mg | INHALATION_SOLUTION | RESPIRATORY_TRACT | 12 refills | Status: AC | PRN
  Filled 2024-11-09: qty 75, 5d supply, fill #0

## 2024-11-15 ENCOUNTER — Ambulatory Visit: Admitting: Psychiatry

## 2024-11-15 DIAGNOSIS — F411 Generalized anxiety disorder: Secondary | ICD-10-CM | POA: Diagnosis not present

## 2024-11-15 NOTE — Progress Notes (Signed)
"   °      [facility] Counselor/Therapist Progress Note  Patient ID: Brianna Barr, MRN: [id number],    Date: 11/15/2024  Time Spent: 53 minutes   Treatment Type: Individual Therapy  Reported Symptoms: anxiety, depression, uncertainty, concerns re: family health issues  Mental Status Exam:  Appearance:   Casual and Neat     Behavior:  Appropriate, Sharing, and Motivated  Motor:  Normal  Speech/Language:   Clear and Coherent  Affect:  Depressed and anxious  Mood:  anxious and depressed  Thought process:  goal directed  Thought content:    Rumination  Sensory/Perceptual disturbances:    WNL  Orientation:  oriented to person, place, time/date, situation, day of week, month of year, year, and stated date of 11/15/2024  Attention:  Good  Concentration:  Good  Memory:  WNL  Fund of knowledge:   Good  Insight:    Good  Judgment:   Good  Impulse Control:  Good   Risk Assessment: Danger to Self:  No Self-injurious Behavior: No Danger to Others: No Duty to Warn:no Physical Aggression / Violence:No  Access to Firearms a concern: No  Gang Involvement:No   Subjective:  Patient in today and reporting anxiety, depression, uncertainty re: family health concerns, her own upcoming surgery for plantar facsitis. Mom and Dad both having significant health concerns/uncertainties. Mom to have 2 days of testing at [facility] coming up soon, and Dad is also having some medical evaluation related to heart symptoms, shortness of breath, and swelling. Kidneys already been checked and they didn't feel that was the source of these current symptoms. Patient very worried about his symptoms escalating. Family history strong for heart issues. Patient trying not to make assumptions for worst case situations, but this is also difficult for her with so much happening right now with the health of both mom and dad. Discussed her own self-care and ability to get support from others. Her church is a good source of support  for her but patient is understandably concerned and wanting to get some positive feedback on the health of both her mom and dad. Seemed to be helpful for her to vent and feel heard regarding her stress and anxieties today about both parents.    Interventions: Cognitive Behavioral Therapy, Solution-Oriented/Positive Psychology, and Ego-Supportive Long Term Goal: Reduce overall level, frequency, and intensity of the anxiety so that daily functioning is not impaired. Short term Goal: Increase understanding of beliefs and messages that produce anxiety, depression, or worries.  Strategy: Identify, challenge, and replace anxious/depressive/fearful self-talk with positive, realistic, and empowering self-talk.   Diagnosis:   ICD-10-CM   1. Generalized anxiety disorder  F41.1      Plan:   Patient working in session today on her anxiety, depression, family health issues, personal concerns about her own upcoming surgery, and family interactions that can feel hurtful. Actively involved with both parents and their health concerns. Concerned also about her own health and pending surgery but recognizes her health issues are not as urgent and I realized this more recently.   Goal review and progress/challenges noted with patient.  Next appt within 2 weeks.   Barnie Bunde, LCSW                   "

## 2024-11-18 ENCOUNTER — Ambulatory Visit (INDEPENDENT_AMBULATORY_CARE_PROVIDER_SITE_OTHER)

## 2024-11-18 DIAGNOSIS — J302 Other seasonal allergic rhinitis: Secondary | ICD-10-CM | POA: Diagnosis not present

## 2024-11-22 NOTE — Telephone Encounter (Signed)
 Duplicate message, filed

## 2024-11-23 ENCOUNTER — Other Ambulatory Visit (HOSPITAL_BASED_OUTPATIENT_CLINIC_OR_DEPARTMENT_OTHER): Payer: Self-pay

## 2024-11-25 ENCOUNTER — Other Ambulatory Visit (HOSPITAL_BASED_OUTPATIENT_CLINIC_OR_DEPARTMENT_OTHER): Payer: Self-pay

## 2024-11-25 ENCOUNTER — Ambulatory Visit: Admitting: Podiatry

## 2024-11-25 DIAGNOSIS — M79671 Pain in right foot: Secondary | ICD-10-CM | POA: Diagnosis not present

## 2024-11-25 DIAGNOSIS — M722 Plantar fascial fibromatosis: Secondary | ICD-10-CM

## 2024-11-25 DIAGNOSIS — M79672 Pain in left foot: Secondary | ICD-10-CM

## 2024-11-25 MED ORDER — JOURNAVX 50 MG PO TABS
50.0000 mg | ORAL_TABLET | Freq: Two times a day (BID) | ORAL | 0 refills | Status: AC
  Filled 2024-11-25: qty 20, 10d supply, fill #0

## 2024-11-25 NOTE — Progress Notes (Signed)
 "   Chief Complaint  Patient presents with   Results    MRI results and sx planning, she is wanting to plan on SX.    HPI: 29 y.o. female presents today to review her MRI results and also discuss proceeding with surgery for her chronic bilateral plantar fasciitis.  She notes that the pain is still present and she is ready to proceed with surgery at this time.  Past Medical History:  Diagnosis Date   Anxiety    Asthma    Depression    Overactive bladder    Stress incontinence    Past Surgical History:  Procedure Laterality Date   WISDOM TOOTH EXTRACTION     Allergies[1]   Physical Exam: Palpable pedal pulses are noted.  No ecchymosis or edema is appreciated.  There continues to be pain on palpation of the plantar medial and plantar central portions of the heels.  Negative Tinel's sign of the posterior tibial nerve as noted on previous exam as well.  Assessment/Plan of Care: 1. Plantar fasciitis   2. Pain in both feet      Meds ordered this encounter  Medications   Suzetrigine  (JOURNAVX ) 50 MG TABS    Sig: Take 50 mg by mouth in the morning and at bedtime for 10 days. Begin taking after your surgery    Dispense:  20 tablet    Refill:  0   Discussed findings with the patient today.  Also reviewed her MRI report as well indicating that there were no other soft tissue contributing factors to her heel pain and it also confirmed her plantar fasciitis.  Since conservative measures have been unsuccessful, we opted to proceed with surgical intervention at this time.  This will consist of a bilateral plantar fasciotomy with possible inferior heel spur excision (if present at time of surgery), with bilateral PRP injection to be drawn in preop.  Benefits, risks, and possible postoperative complications were discussed, as well as any possible sequela if she opts not to proceed with any surgical intervention.  I discussed the procedure in detail with the patient and answered all questions.  We  did discuss the postoperative course and will provide for her to have up to 6 weeks out of work to allow for full recovery.  She may reach out to our FMLA representative to have paperwork completed.  We did discuss postoperative pain management as well.  Will go ahead and prescribe Journavx  today and she was given a drug coupon.  She was asked to allow a couple days before trying to fill the prescription as it may need a prior authorization.  She will hold onto this until the day of surgery and begin taking after surgery.  She was given her surgery packet and surgery paperwork at checkout.  Verbal and written consent were obtained today.  She would like to have the surgery performed the last week of March 2026  She is on a GLP-1 medication and was advised to stop this at least 1 week prior to surgery.  Awanda CHARM Imperial, DPM, FACFAS [facility]     [address], [place] [address]                Office [phone]  Fax [phone]    [1]  Allergies Allergen Reactions   Covid-19 (Mrna Bivalent) Vaccine Proofreader) [Covid-19 (Mrna) Vaccine] Other (See Comments)    Arm pain, stiffness in arm and neck, redness and swelling of arm, severe itching   "

## 2024-11-25 NOTE — Patient Instructions (Signed)

## 2024-11-28 ENCOUNTER — Other Ambulatory Visit (HOSPITAL_BASED_OUTPATIENT_CLINIC_OR_DEPARTMENT_OTHER): Payer: Self-pay

## 2024-11-29 ENCOUNTER — Ambulatory Visit: Admitting: Psychiatry

## 2024-11-29 ENCOUNTER — Other Ambulatory Visit (HOSPITAL_BASED_OUTPATIENT_CLINIC_OR_DEPARTMENT_OTHER): Payer: Self-pay

## 2024-11-29 ENCOUNTER — Other Ambulatory Visit: Payer: Self-pay

## 2024-11-29 ENCOUNTER — Telehealth (HOSPITAL_BASED_OUTPATIENT_CLINIC_OR_DEPARTMENT_OTHER): Payer: Self-pay

## 2024-11-29 ENCOUNTER — Other Ambulatory Visit (HOSPITAL_COMMUNITY): Payer: Self-pay

## 2024-12-01 ENCOUNTER — Ambulatory Visit

## 2024-12-01 DIAGNOSIS — J302 Other seasonal allergic rhinitis: Secondary | ICD-10-CM

## 2024-12-13 ENCOUNTER — Ambulatory Visit: Admitting: Psychiatry

## 2024-12-26 ENCOUNTER — Ambulatory Visit: Admitting: Psychiatry
